# Patient Record
Sex: Female | Born: 1950
Health system: Southern US, Community
[De-identification: ages and names within clinical notes are randomized; demographics above are authoritative.]

## PROBLEM LIST (undated history)

## (undated) DIAGNOSIS — S83231A Complex tear of medial meniscus, current injury, right knee, initial encounter: Secondary | ICD-10-CM

## (undated) DIAGNOSIS — G8929 Other chronic pain: Secondary | ICD-10-CM

## (undated) DIAGNOSIS — E669 Obesity, unspecified: Secondary | ICD-10-CM

## (undated) DIAGNOSIS — R413 Other amnesia: Secondary | ICD-10-CM

## (undated) DIAGNOSIS — J45909 Unspecified asthma, uncomplicated: Secondary | ICD-10-CM

## (undated) DIAGNOSIS — R251 Tremor, unspecified: Secondary | ICD-10-CM

## (undated) DIAGNOSIS — M810 Age-related osteoporosis without current pathological fracture: Secondary | ICD-10-CM

## (undated) DIAGNOSIS — C801 Malignant (primary) neoplasm, unspecified: Secondary | ICD-10-CM

## (undated) DIAGNOSIS — M7542 Impingement syndrome of left shoulder: Secondary | ICD-10-CM

## (undated) DIAGNOSIS — E785 Hyperlipidemia, unspecified: Secondary | ICD-10-CM

## (undated) DIAGNOSIS — M48062 Spinal stenosis, lumbar region with neurogenic claudication: Secondary | ICD-10-CM

## (undated) DIAGNOSIS — I8393 Asymptomatic varicose veins of bilateral lower extremities: Secondary | ICD-10-CM

## (undated) DIAGNOSIS — Z860101 Personal history of adenomatous and serrated colon polyps: Secondary | ICD-10-CM

## (undated) DIAGNOSIS — M5030 Other cervical disc degeneration, unspecified cervical region: Secondary | ICD-10-CM

## (undated) DIAGNOSIS — E119 Type 2 diabetes mellitus without complications: Secondary | ICD-10-CM

## (undated) DIAGNOSIS — M549 Dorsalgia, unspecified: Secondary | ICD-10-CM

## (undated) DIAGNOSIS — M7052 Other bursitis of knee, left knee: Secondary | ICD-10-CM

## (undated) DIAGNOSIS — I10 Essential (primary) hypertension: Secondary | ICD-10-CM

## (undated) DIAGNOSIS — M1711 Unilateral primary osteoarthritis, right knee: Secondary | ICD-10-CM

## (undated) DIAGNOSIS — J309 Allergic rhinitis, unspecified: Secondary | ICD-10-CM

## (undated) HISTORY — PX: SPLENECTOMY: SUR1306

## (undated) HISTORY — DX: Type 2 diabetes mellitus without complications: E11.9

## (undated) HISTORY — DX: Age-related osteoporosis without current pathological fracture: M81.0

## (undated) HISTORY — DX: Hyperlipidemia, unspecified: E78.5

## (undated) HISTORY — PX: BREAST CYST ASPIRATION: SHX578

## (undated) HISTORY — PX: PARTIAL HYSTERECTOMY: SHX80

## (undated) HISTORY — PX: ABDOMINAL HYSTERECTOMY: SHX81

---

## 2005-04-06 ENCOUNTER — Ambulatory Visit: Payer: Self-pay | Admitting: Obstetrics and Gynecology

## 2005-04-07 ENCOUNTER — Ambulatory Visit: Payer: Self-pay | Admitting: Gastroenterology

## 2006-01-28 ENCOUNTER — Ambulatory Visit: Payer: Self-pay | Admitting: Family Medicine

## 2006-04-07 ENCOUNTER — Ambulatory Visit: Payer: Self-pay | Admitting: Obstetrics and Gynecology

## 2006-07-16 ENCOUNTER — Ambulatory Visit: Payer: Self-pay | Admitting: Family Medicine

## 2006-08-18 ENCOUNTER — Ambulatory Visit: Payer: Self-pay | Admitting: Family Medicine

## 2006-09-01 ENCOUNTER — Ambulatory Visit: Payer: Self-pay | Admitting: Family Medicine

## 2007-02-07 ENCOUNTER — Ambulatory Visit: Payer: Self-pay | Admitting: Family Medicine

## 2007-04-20 ENCOUNTER — Ambulatory Visit: Payer: Self-pay | Admitting: Obstetrics and Gynecology

## 2007-06-17 ENCOUNTER — Ambulatory Visit: Payer: Self-pay | Admitting: Family Medicine

## 2007-10-19 ENCOUNTER — Ambulatory Visit: Payer: Self-pay | Admitting: Family Medicine

## 2008-04-24 ENCOUNTER — Ambulatory Visit: Payer: Self-pay | Admitting: Obstetrics and Gynecology

## 2008-05-14 ENCOUNTER — Ambulatory Visit: Payer: Self-pay | Admitting: Unknown Physician Specialty

## 2009-03-11 ENCOUNTER — Emergency Department: Payer: Self-pay | Admitting: Emergency Medicine

## 2009-04-01 ENCOUNTER — Ambulatory Visit: Payer: Self-pay | Admitting: Family Medicine

## 2009-04-02 ENCOUNTER — Emergency Department: Payer: Self-pay | Admitting: Internal Medicine

## 2009-04-04 ENCOUNTER — Ambulatory Visit: Payer: Self-pay | Admitting: Family Medicine

## 2009-04-29 ENCOUNTER — Ambulatory Visit: Payer: Self-pay | Admitting: Obstetrics and Gynecology

## 2010-02-28 ENCOUNTER — Ambulatory Visit: Payer: Self-pay | Admitting: Family Medicine

## 2010-05-15 ENCOUNTER — Ambulatory Visit: Payer: Self-pay | Admitting: Obstetrics and Gynecology

## 2010-06-10 ENCOUNTER — Ambulatory Visit: Payer: Self-pay | Admitting: Family Medicine

## 2010-06-13 ENCOUNTER — Ambulatory Visit: Payer: Self-pay | Admitting: Family Medicine

## 2011-05-21 ENCOUNTER — Ambulatory Visit: Payer: Self-pay | Admitting: Obstetrics and Gynecology

## 2011-06-04 ENCOUNTER — Ambulatory Visit: Payer: Self-pay | Admitting: Obstetrics and Gynecology

## 2011-06-22 ENCOUNTER — Ambulatory Visit: Payer: Self-pay | Admitting: Surgery

## 2011-06-22 HISTORY — PX: BREAST BIOPSY: SHX20

## 2011-09-30 ENCOUNTER — Ambulatory Visit: Payer: Self-pay | Admitting: Family Medicine

## 2011-12-24 ENCOUNTER — Ambulatory Visit: Payer: Self-pay | Admitting: Surgery

## 2012-05-27 ENCOUNTER — Ambulatory Visit: Payer: Self-pay | Admitting: Obstetrics and Gynecology

## 2012-07-15 ENCOUNTER — Ambulatory Visit: Payer: Self-pay | Admitting: Family Medicine

## 2012-11-07 ENCOUNTER — Ambulatory Visit: Payer: Self-pay | Admitting: Family Medicine

## 2012-11-30 HISTORY — PX: COLONOSCOPY: SHX5424

## 2012-12-27 ENCOUNTER — Ambulatory Visit: Payer: Self-pay | Admitting: Family Medicine

## 2013-05-30 ENCOUNTER — Ambulatory Visit: Payer: Self-pay | Admitting: Obstetrics and Gynecology

## 2013-11-26 ENCOUNTER — Emergency Department: Payer: Self-pay | Admitting: Emergency Medicine

## 2013-11-26 LAB — CBC
HCT: 40.7 % (ref 35.0–47.0)
HGB: 13.1 g/dL (ref 12.0–16.0)
MCH: 25.6 pg — ABNORMAL LOW (ref 26.0–34.0)
MCV: 79 fL — ABNORMAL LOW (ref 80–100)
RBC: 5.13 10*6/uL (ref 3.80–5.20)
RDW: 13.3 % (ref 11.5–14.5)
WBC: 7.9 10*3/uL (ref 3.6–11.0)

## 2013-11-26 LAB — BASIC METABOLIC PANEL
Anion Gap: 6 — ABNORMAL LOW (ref 7–16)
Calcium, Total: 9.4 mg/dL (ref 8.5–10.1)
Chloride: 103 mmol/L (ref 98–107)
EGFR (Non-African Amer.): >60
Glucose: 130 mg/dL — ABNORMAL HIGH (ref 65–99)
Sodium: 137 mmol/L (ref 136–145)

## 2013-11-26 LAB — TROPONIN I: Troponin-I: <0.02 ng/mL

## 2014-04-12 DIAGNOSIS — M810 Age-related osteoporosis without current pathological fracture: Secondary | ICD-10-CM | POA: Insufficient documentation

## 2014-04-12 DIAGNOSIS — E119 Type 2 diabetes mellitus without complications: Secondary | ICD-10-CM | POA: Insufficient documentation

## 2014-04-12 DIAGNOSIS — E118 Type 2 diabetes mellitus with unspecified complications: Secondary | ICD-10-CM | POA: Insufficient documentation

## 2014-04-12 DIAGNOSIS — E7800 Pure hypercholesterolemia, unspecified: Secondary | ICD-10-CM | POA: Insufficient documentation

## 2014-05-31 ENCOUNTER — Ambulatory Visit: Payer: Self-pay | Admitting: Obstetrics and Gynecology

## 2015-05-11 ENCOUNTER — Other Ambulatory Visit: Payer: Self-pay | Admitting: Family Medicine

## 2015-05-11 DIAGNOSIS — E119 Type 2 diabetes mellitus without complications: Secondary | ICD-10-CM

## 2015-05-22 ENCOUNTER — Other Ambulatory Visit: Payer: Self-pay | Admitting: Obstetrics and Gynecology

## 2015-05-22 DIAGNOSIS — M549 Dorsalgia, unspecified: Secondary | ICD-10-CM | POA: Insufficient documentation

## 2015-05-22 DIAGNOSIS — E669 Obesity, unspecified: Secondary | ICD-10-CM | POA: Insufficient documentation

## 2015-05-22 DIAGNOSIS — Z1231 Encounter for screening mammogram for malignant neoplasm of breast: Secondary | ICD-10-CM

## 2015-06-04 ENCOUNTER — Ambulatory Visit
Admission: RE | Admit: 2015-06-04 | Discharge: 2015-06-04 | Disposition: A | Discharge status: Home / Self Care | Payer: Medicare Other | Source: Ambulatory Visit | Attending: Obstetrics and Gynecology | Admitting: Obstetrics and Gynecology

## 2015-06-04 DIAGNOSIS — Z1231 Encounter for screening mammogram for malignant neoplasm of breast: Secondary | ICD-10-CM

## 2015-06-04 HISTORY — DX: Malignant (primary) neoplasm, unspecified: C80.1

## 2015-06-29 ENCOUNTER — Other Ambulatory Visit: Payer: Self-pay | Admitting: Family Medicine

## 2015-06-29 DIAGNOSIS — M810 Age-related osteoporosis without current pathological fracture: Secondary | ICD-10-CM

## 2015-07-08 ENCOUNTER — Other Ambulatory Visit: Payer: Self-pay | Admitting: Family Medicine

## 2015-07-08 DIAGNOSIS — E119 Type 2 diabetes mellitus without complications: Secondary | ICD-10-CM

## 2015-07-08 DIAGNOSIS — E785 Hyperlipidemia, unspecified: Secondary | ICD-10-CM

## 2015-08-24 ENCOUNTER — Other Ambulatory Visit: Payer: Self-pay | Admitting: Family Medicine

## 2015-08-24 DIAGNOSIS — M810 Age-related osteoporosis without current pathological fracture: Secondary | ICD-10-CM

## 2015-08-26 ENCOUNTER — Other Ambulatory Visit: Payer: Self-pay | Admitting: Family Medicine

## 2015-08-26 DIAGNOSIS — E119 Type 2 diabetes mellitus without complications: Secondary | ICD-10-CM

## 2015-08-27 ENCOUNTER — Other Ambulatory Visit: Payer: Self-pay | Admitting: Family Medicine

## 2015-08-27 ENCOUNTER — Other Ambulatory Visit: Payer: Self-pay

## 2015-08-30 ENCOUNTER — Ambulatory Visit (INDEPENDENT_AMBULATORY_CARE_PROVIDER_SITE_OTHER): Payer: Medicare Other | Admitting: Family Medicine

## 2015-08-30 ENCOUNTER — Encounter: Payer: Self-pay | Admitting: Family Medicine

## 2015-08-30 VITALS — BP 138/80 | HR 80 | Ht 63.0 in | Wt 190.0 lb

## 2015-08-30 DIAGNOSIS — E119 Type 2 diabetes mellitus without complications: Secondary | ICD-10-CM

## 2015-08-30 DIAGNOSIS — E785 Hyperlipidemia, unspecified: Secondary | ICD-10-CM | POA: Diagnosis not present

## 2015-08-30 DIAGNOSIS — M810 Age-related osteoporosis without current pathological fracture: Secondary | ICD-10-CM | POA: Diagnosis not present

## 2015-08-30 MED ORDER — ALENDRONATE SODIUM 70 MG PO TABS: 70.0000 mg | ORAL_TABLET | ORAL | Status: DC

## 2015-08-30 MED ORDER — SIMVASTATIN 40 MG PO TABS: 40.0000 mg | ORAL_TABLET | Freq: Every day | ORAL | Status: DC

## 2015-08-30 MED ORDER — PIOGLITAZONE HCL 15 MG PO TABS: 15.0000 mg | ORAL_TABLET | Freq: Every day | ORAL | Status: DC

## 2015-08-30 MED ORDER — GLIPIZIDE ER 5 MG PO TB24: 5.0000 mg | ORAL_TABLET | Freq: Every day | ORAL | Status: DC

## 2015-08-30 MED ORDER — METFORMIN HCL 500 MG PO TABS: 500.0000 mg | ORAL_TABLET | Freq: Two times a day (BID) | ORAL | Status: DC

## 2015-08-30 NOTE — Patient Instructions (Signed)

## 2015-08-30 NOTE — Progress Notes (Signed)
Name: Lindsey George   MRN: [id number]    DOB: [birth year]   Date:08/30/2015       Progress Note  Subjective  Chief Complaint  Chief Complaint  Patient presents with  . Diabetes  . Hyperlipidemia  . Osteoporosis    HPI Comments: followup for osteoporosis/ on fosamax/  Allergy injections weekly  Diabetes She presents for her follow-up diabetic visit. She has type 2 diabetes mellitus. There are no hypoglycemic associated symptoms. Pertinent negatives for hypoglycemia include no dizziness, headaches or nervousness/anxiousness. Pertinent negatives for diabetes include no blurred vision, no chest pain, no fatigue, no foot paresthesias, no foot ulcerations, no polydipsia, no polyphagia, no polyuria, no visual change, no weakness and no weight loss. There are no hypoglycemic complications. Symptoms are stable. There are no diabetic complications. Risk factors for coronary artery disease include diabetes mellitus and obesity. Current diabetic treatment includes oral agent (triple therapy). She is compliant with treatment all of the time. Her weight is stable. She is following a generally healthy diet. When asked about meal planning, she reported none. There is no change in her home blood glucose trend. Her breakfast blood glucose range is generally 130-140 mg/dl. An ACE inhibitor/angiotensin II receptor blocker is not being taken. Eye exam is current.  Hyperlipidemia This is a chronic problem. The current episode started more than 1 year ago. Factors aggravating her hyperlipidemia include thiazides. Pertinent negatives include no chest pain, focal weakness, myalgias or shortness of breath. Current antihyperlipidemic treatment includes statins. The current treatment provides moderate improvement of lipids. There are no compliance problems.     No problem-specific assessment & plan notes found for this encounter.   Past Medical History  Diagnosis Date  . Cancer     skin ca  . Diabetes mellitus  without complication   . Hyperlipidemia   . Osteoporosis     Past Surgical History  Procedure Laterality Date  . Breast biopsy Right 06/22/11    bx/clip-neg  . Breast cyst aspiration Left     neg  . Partial hysterectomy    . Splenectomy    . Colonoscopy  2014    cleared for 5 yrs- [place] doc    Family History  Problem Relation Age of Onset  . Breast cancer Sister 100  . Cancer Sister   . Heart disease Sister   . Cancer Father   . Stroke Father     Social History   Social History  . Marital Status: Married    Spouse Name: N/A  . Number of Children: N/A  . Years of Education: N/A   Occupational History  . Not on file.   Social History Main Topics  . Smoking status: Never Smoker   . Smokeless tobacco: Not on file  . Alcohol Use: No  . Drug Use: No  . Sexual Activity: Yes   Other Topics Concern  . Not on file   Social History Narrative    Allergies  Allergen Reactions  . Codeine Other (See Comments)     Review of Systems  Constitutional: Negative for fever, chills, weight loss, malaise/fatigue and fatigue.  HENT: Negative for ear discharge, ear pain and sore throat.   Eyes: Negative for blurred vision.  Respiratory: Negative for cough, sputum production, shortness of breath and wheezing.   Cardiovascular: Negative for chest pain, palpitations and leg swelling.  Gastrointestinal: Negative for heartburn, nausea, abdominal pain, diarrhea, constipation, blood in stool and melena.  Genitourinary: Negative for dysuria, urgency, frequency and hematuria.  Musculoskeletal:  Negative for myalgias, back pain, joint pain and neck pain.  Skin: Negative for rash.  Neurological: Negative for dizziness, tingling, sensory change, focal weakness, weakness and headaches.  Endo/Heme/Allergies: Negative for environmental allergies, polydipsia and polyphagia. Does not bruise/bleed easily.  Psychiatric/Behavioral: Negative for depression and suicidal ideas. The patient is not  nervous/anxious and does not have insomnia.      Objective  Filed Vitals:   08/30/15 1007  BP: 138/80  Pulse: 80  Height: 5\' 3"  (1.6 m)  Weight: 190 lb (86.183 kg)    Physical Exam  Constitutional: She is well-developed, well-nourished, and in no distress. No distress.  HENT:  Head: Normocephalic and atraumatic.  Right Ear: External ear normal.  Left Ear: External ear normal.  Nose: Nose normal.  Mouth/Throat: Oropharynx is clear and moist.  Eyes: Conjunctivae and EOM are normal. Pupils are equal, round, and reactive to light. Right eye exhibits no discharge. Left eye exhibits no discharge.  Neck: Normal range of motion. Neck supple. No JVD present. No thyromegaly present.  Cardiovascular: Normal rate, regular rhythm, normal heart sounds and intact distal pulses.  Exam reveals no gallop and no friction rub.   No murmur heard. Pulmonary/Chest: Effort normal and breath sounds normal.  Abdominal: Soft. Bowel sounds are normal. She exhibits no mass. There is no tenderness. There is no guarding.  Musculoskeletal: Normal range of motion. She exhibits no edema.  Lymphadenopathy:    She has no cervical adenopathy.  Neurological: She is alert. She has normal reflexes.  Skin: Skin is warm and dry. She is not diaphoretic.  Psychiatric: Mood and affect normal.  Nursing note and vitals reviewed.     Assessment & Plan  Problem List Items Addressed This Visit    None    Visit Diagnoses    Type 2 diabetes mellitus without complication    -  Primary    Relevant Medications    aspirin 81 MG tablet    glipiZIDE (GLUCOTROL XL) 5 MG 24 hr tablet    metFORMIN (GLUCOPHAGE) 500 MG tablet    pioglitazone (ACTOS) 15 MG tablet    simvastatin (ZOCOR) 40 MG tablet    Other Relevant Orders    Renal Function Panel    HgB A1c    Hyperlipidemia        Relevant Medications    aspirin 81 MG tablet    simvastatin (ZOCOR) 40 MG tablet    Other Relevant Orders    Lipid Profile    Osteoporosis         Relevant Medications    calcium-vitamin D (OSCAL WITH D) 500-200 MG-UNIT tablet    alendronate (FOSAMAX) 70 MG tablet         Dr. Deanna Jones [facility]  08/30/2015

## 2015-08-31 LAB — LIPID PANEL
CHOL/HDL RATIO: 2.9 ratio (ref 0.0–4.4)
Cholesterol, Total: 142 mg/dL (ref 100–199)
HDL: 49 mg/dL (ref >39)
LDL CALC: 68 mg/dL (ref 0–99)
Triglycerides: 125 mg/dL (ref 0–149)
VLDL CHOLESTEROL CAL: 25 mg/dL (ref 5–40)

## 2015-08-31 LAB — RENAL FUNCTION PANEL
Albumin: 4.8 g/dL (ref 3.6–4.8)
BUN/Creatinine Ratio: 29 — ABNORMAL HIGH (ref 11–26)
BUN: 17 mg/dL (ref 8–27)
CO2: 26 mmol/L (ref 18–29)
Calcium: 10.2 mg/dL (ref 8.7–10.3)
Chloride: 98 mmol/L (ref 97–108)
Creatinine, Ser: 0.58 mg/dL (ref 0.57–1.00)
GFR calc Af Amer: 112 mL/min/{1.73_m2} (ref >59)
GFR, EST NON AFRICAN AMERICAN: 97 mL/min/{1.73_m2} (ref >59)
GLUCOSE: 118 mg/dL — AB (ref 65–99)
PHOSPHORUS: 3.8 mg/dL (ref 2.5–4.5)
POTASSIUM: 4.5 mmol/L (ref 3.5–5.2)
SODIUM: 138 mmol/L (ref 134–144)

## 2015-08-31 LAB — HEMOGLOBIN A1C
ESTIMATED AVERAGE GLUCOSE: 157 mg/dL
Hgb A1c MFr Bld: 7.1 % — ABNORMAL HIGH (ref 4.8–5.6)

## 2015-09-03 ENCOUNTER — Ambulatory Visit (INDEPENDENT_AMBULATORY_CARE_PROVIDER_SITE_OTHER): Payer: Medicare Other

## 2015-09-03 DIAGNOSIS — Z23 Encounter for immunization: Secondary | ICD-10-CM | POA: Diagnosis not present

## 2015-10-03 ENCOUNTER — Ambulatory Visit (INDEPENDENT_AMBULATORY_CARE_PROVIDER_SITE_OTHER): Payer: Medicare Other | Admitting: Family Medicine

## 2015-10-03 ENCOUNTER — Encounter: Payer: Self-pay | Admitting: Family Medicine

## 2015-10-03 VITALS — BP 120/60 | HR 84 | Ht 63.0 in | Wt 191.0 lb

## 2015-10-03 DIAGNOSIS — B070 Plantar wart: Secondary | ICD-10-CM | POA: Diagnosis not present

## 2015-10-03 NOTE — Patient Instructions (Signed)
Plantar Warts Warts are small growths on the skin. They can occur on various areas of the body. When they occur on the underside (sole) of the foot, they are called plantar warts. Plantar warts often occur in groups, with several small warts around a larger growth. They tend to develop over areas of pressure, such as the heel or the ball of the foot. Most warts are not painful, and they usually do not cause problems. However, plantar warts may cause pain when you walk because pressure is applied to them. Warts often go away on their own in time. Various treatments may be done if needed. Sometimes, warts go away and then they come back again. CAUSES Plantar warts are caused by a type of virus that is called human papillomavirus (HPV). HPV attacks a break in the skin of the foot. Walking barefoot can lead to exposure to the virus. These warts may spread to other areas of the sole. They spread to other areas of the body only through direct contact. RISK FACTORS Plantar warts are more likely to develop in:  People who are 10-20 years of age.  People who use public showers or locker rooms.  People who have a weakened body defense system (immune system). SYMPTOMS Plantar warts may be flat or slightly raised. They may grow into the deeper layers of skin or rise above the surface of the skin. Most plantar warts have a rough surface. They may cause pain when you use your foot to support your body weight. DIAGNOSIS A plantar wart can usually be diagnosed from its appearance. In some cases, a tissue sample may be removed (biopsy) to be looked at under a microscope. TREATMENT In many cases, warts do not need treatment. Without treatment, they often go away over a period of many months to a couple years. If treatment is needed, options may include:  Applying medicated solutions, creams, or patches to the wart. These may be over-the-counter or prescription medicines that make the skin soft so that layers will  gradually shed away. In many cases, the medicine is applied one or two times per day and covered with a bandage.  Putting duct tape over the top of the wart (occlusion). You will leave the tape in place for as long as told by your health care provider, then you will replace it with a new strip of tape. This is done until the wart goes away.  Freezing the wart with liquid nitrogen (cryotherapy).  Burning the wart with:  Laser treatment.  An electrified probe (electrocautery).  Injection of a medicine (Candida antigen) into the wart to help the body's immune system to fight off the wart.  Surgery to remove the wart. HOME CARE INSTRUCTIONS  Apply medicated creams or solutions only as told by your health care provider. This may involve:  Soaking the affected area in warm water.  Removing the top layer of softened skin before you apply the medicine. A pumice stone works well for removing the tissue.  Applying a bandage over the affected area after you apply the medicine.  Repeating the process daily or as told by your health care provider.  Do not scratch or pick at a wart.  Wash your hands after you touch a wart.  If a wart is painful, try applying a bandage with a hole in the middle over the wart. The helps to take pressure off the wart.  Keep all follow-up visits as told by your health care provider. This is important. PREVENTION   Take these actions to help prevent warts:  Wear shoes and socks. Change your socks daily.  Keep your feet clean and dry.  Check your feet regularly.  Avoid direct contact with warts on other people. SEEK MEDICAL CARE IF:  Your warts do not improve after treatment.  You have redness, swelling, or pain at the site of a wart.  You have bleeding from a wart that does not stop with light pressure.  You have diabetes and you develop a wart.   This information is not intended to replace advice given to you by your health care provider. Make sure  you discuss any questions you have with your health care provider.   Document Released: 02/06/2004 Document Revised: 08/07/2015 Document Reviewed: 02/11/2015 Elsevier Interactive Patient Education 2016 [employer]  

## 2015-10-03 NOTE — Progress Notes (Signed)
Name: Lindsey George   MRN: [id number]    DOB: 08/05/1951   Date:10/03/2015       Progress Note  Subjective  Chief Complaint  Chief Complaint  Patient presents with  . Foot Pain    walking causes a pain on the side of the R) foot that appears to have "something trying to come out of it"    Foot Pain This is a new problem. The current episode started 1 to 4 weeks ago. The problem occurs daily. The problem has been unchanged. Pertinent negatives include no abdominal pain, anorexia, chest pain, chills, coughing, fatigue, fever, headaches, myalgias, nausea, neck pain, rash or sore throat. Associated symptoms comments: Keratin buildup base of 5th toe. The symptoms are aggravated by walking. She has tried nothing for the symptoms. The treatment provided no relief.    No problem-specific assessment & plan notes found for this encounter.   Past Medical History  Diagnosis Date  . Cancer ([place])     skin ca  . Diabetes mellitus without complication ([place])   . Hyperlipidemia   . Osteoporosis     Past Surgical History  Procedure Laterality Date  . Breast biopsy Right 06/22/11    bx/clip-neg  . Breast cyst aspiration Left     neg  . Partial hysterectomy    . Splenectomy    . Colonoscopy  2014    cleared for 5 yrs- [place] doc    Family History  Problem Relation Age of Onset  . Breast cancer Sister 59  . Cancer Sister   . Heart disease Sister   . Cancer Father   . Stroke Father     Social History   Social History  . Marital Status: Married    Spouse Name: N/A  . Number of Children: N/A  . Years of Education: N/A   Occupational History  . Not on file.   Social History Main Topics  . Smoking status: Never Smoker   . Smokeless tobacco: Not on file  . Alcohol Use: No  . Drug Use: No  . Sexual Activity: Yes   Other Topics Concern  . Not on file   Social History Narrative    Allergies  Allergen Reactions  . Codeine Other (See Comments)     Review of Systems    Constitutional: Negative for fever, chills, weight loss, malaise/fatigue and fatigue.  HENT: Negative for ear discharge, ear pain and sore throat.   Eyes: Negative for blurred vision.  Respiratory: Negative for cough, sputum production, shortness of breath and wheezing.   Cardiovascular: Negative for chest pain, palpitations and leg swelling.  Gastrointestinal: Negative for heartburn, nausea, abdominal pain, diarrhea, constipation, blood in stool, melena and anorexia.  Genitourinary: Negative for dysuria, urgency, frequency and hematuria.  Musculoskeletal: Negative for myalgias, back pain, joint pain and neck pain.  Skin: Negative for rash.  Neurological: Negative for dizziness, tingling, sensory change, focal weakness and headaches.  Endo/Heme/Allergies: Negative for environmental allergies and polydipsia. Does not bruise/bleed easily.  Psychiatric/Behavioral: Negative for depression and suicidal ideas. The patient is not nervous/anxious and does not have insomnia.      Objective  Filed Vitals:   10/03/15 1442  BP: 120/60  Pulse: 84  Height: 5\' 3"  (1.6 m)  Weight: 191 lb (86.637 kg)    Physical Exam  Constitutional: She is well-developed, well-nourished, and in no distress. No distress.  HENT:  Head: Normocephalic and atraumatic.  Right Ear: External ear normal.  Left Ear: External ear normal.  Nose: Nose normal.  Mouth/Throat: Oropharynx is clear and moist.  Eyes: Conjunctivae and EOM are normal. Pupils are equal, round, and reactive to light. Right eye exhibits no discharge. Left eye exhibits no discharge.  Neck: Normal range of motion. Neck supple. No JVD present. No thyromegaly present.  Cardiovascular: Normal rate, regular rhythm, normal heart sounds and intact distal pulses.  Exam reveals no gallop and no friction rub.   No murmur heard. Pulmonary/Chest: Effort normal and breath sounds normal.  Abdominal: Soft. Bowel sounds are normal. She exhibits no mass. There is no  tenderness. There is no guarding.  Musculoskeletal: Normal range of motion. She exhibits no edema.  Lymphadenopathy:    She has no cervical adenopathy.  Neurological: She is alert. She has normal reflexes.  Skin: Skin is warm and dry. She is not diaphoretic.     Keratin dome with central depression  Psychiatric: Mood and affect normal.      Assessment & Plan  Problem List Items Addressed This Visit    None    Visit Diagnoses    Plantar wart of right foot    -  Primary    area cleaned debrided cryosurgery         Dr. [facility]  10/03/2015

## 2015-11-19 ENCOUNTER — Other Ambulatory Visit: Payer: Self-pay

## 2015-12-30 ENCOUNTER — Ambulatory Visit (INDEPENDENT_AMBULATORY_CARE_PROVIDER_SITE_OTHER): Payer: Medicare Other | Admitting: Family Medicine

## 2015-12-30 ENCOUNTER — Encounter: Payer: Self-pay | Admitting: Family Medicine

## 2015-12-30 VITALS — BP 120/62 | HR 80 | Temp 98.9°F | Ht 63.0 in | Wt 190.0 lb

## 2015-12-30 DIAGNOSIS — J4521 Mild intermittent asthma with (acute) exacerbation: Secondary | ICD-10-CM | POA: Diagnosis not present

## 2015-12-30 DIAGNOSIS — J40 Bronchitis, not specified as acute or chronic: Secondary | ICD-10-CM

## 2015-12-30 MED ORDER — ALBUTEROL SULFATE (2.5 MG/3ML) 0.083% IN NEBU: 2.5000 mg | INHALATION_SOLUTION | Freq: Four times a day (QID) | RESPIRATORY_TRACT | Status: DC | PRN

## 2015-12-30 MED ORDER — ALBUTEROL SULFATE (2.5 MG/3ML) 0.083% IN NEBU: 2.5000 mg | INHALATION_SOLUTION | Freq: Once | RESPIRATORY_TRACT | Status: DC

## 2015-12-30 MED ORDER — PREDNISONE 10 MG PO TABS: 10.0000 mg | ORAL_TABLET | Freq: Every day | ORAL | Status: DC

## 2015-12-30 MED ORDER — GUAIFENESIN-CODEINE 100-10 MG/5ML PO SYRP: 5.0000 mL | ORAL_SOLUTION | Freq: Three times a day (TID) | ORAL | Status: DC | PRN

## 2015-12-30 MED ORDER — LEVOFLOXACIN 500 MG PO TABS: 500.0000 mg | ORAL_TABLET | Freq: Every day | ORAL | Status: DC

## 2015-12-30 NOTE — Progress Notes (Signed)
Name: Lindsey George   MRN: [id number]    DOB: 1951/01/07   Date:12/30/2015       Progress Note  Subjective  Chief Complaint  Chief Complaint  Patient presents with  . Bronchitis    sick x 1 day- cong, SOB, cough- no production    Cough This is a new problem. The current episode started in the past 7 days. The problem has been gradually worsening. The problem occurs every few minutes. The cough is non-productive. Associated symptoms include a fever, headaches, nasal congestion, postnasal drip, rhinorrhea, shortness of breath and wheezing. Pertinent negatives include no chest pain, chills, ear congestion, ear pain, heartburn, hemoptysis, myalgias, rash, sore throat, sweats or weight loss. The symptoms are aggravated by cold air. She has tried a beta-agonist inhaler for the symptoms. Her past medical history is significant for asthma. There is no history of bronchiectasis, bronchitis, COPD, environmental allergies or pneumonia.    No problem-specific assessment & plan notes found for this encounter.   Past Medical History  Diagnosis Date  . Cancer ([place])     skin ca  . Diabetes mellitus without complication ([place])   . Hyperlipidemia   . Osteoporosis     Past Surgical History  Procedure Laterality Date  . Breast biopsy Right 06/22/11    bx/clip-neg  . Breast cyst aspiration Left     neg  . Partial hysterectomy    . Splenectomy    . Colonoscopy  2014    cleared for 5 yrs- [place] doc    Family History  Problem Relation Age of Onset  . Breast cancer Sister 59  . Cancer Sister   . Heart disease Sister   . Cancer Father   . Stroke Father     Social History   Social History  . Marital Status: Married    Spouse Name: N/A  . Number of Children: N/A  . Years of Education: N/A   Occupational History  . Not on file.   Social History Main Topics  . Smoking status: Never Smoker   . Smokeless tobacco: Not on file  . Alcohol Use: No  . Drug Use: No  . Sexual Activity: Yes    Other Topics Concern  . Not on file   Social History Narrative    Allergies  Allergen Reactions  . Codeine Other (See Comments)     Review of Systems  Constitutional: Positive for fever. Negative for chills, weight loss and malaise/fatigue.  HENT: Positive for postnasal drip and rhinorrhea. Negative for ear discharge, ear pain and sore throat.   Eyes: Negative for blurred vision.  Respiratory: Positive for cough, shortness of breath and wheezing. Negative for hemoptysis and sputum production.   Cardiovascular: Negative for chest pain, palpitations and leg swelling.  Gastrointestinal: Negative for heartburn, nausea, abdominal pain, diarrhea, constipation, blood in stool and melena.  Genitourinary: Negative for dysuria, urgency, frequency and hematuria.  Musculoskeletal: Negative for myalgias, back pain, joint pain and neck pain.  Skin: Negative for rash.  Neurological: Positive for headaches. Negative for dizziness, tingling, sensory change and focal weakness.  Endo/Heme/Allergies: Negative for environmental allergies and polydipsia. Does not bruise/bleed easily.  Psychiatric/Behavioral: Negative for depression and suicidal ideas. The patient is not nervous/anxious and does not have insomnia.      Objective  Filed Vitals:   12/30/15 1038  BP: 120/62  Pulse: 80  Temp: 98.9 F (37.2 C)  TempSrc: Oral  Height: 5\' 3"  (1.6 m)  Weight: 190 lb (86.183 kg)  SpO2: 94%    Physical Exam  Constitutional: She is well-developed, well-nourished, and in no distress. No distress.  HENT:  Head: Normocephalic and atraumatic.  Right Ear: Tympanic membrane, external ear and ear canal normal.  Left Ear: Tympanic membrane, external ear and ear canal normal.  Nose: Nose normal.  Mouth/Throat: Oropharynx is clear and moist.  Eyes: Conjunctivae and EOM are normal. Pupils are equal, round, and reactive to light. Right eye exhibits no discharge. Left eye exhibits no discharge.  Neck: Normal  range of motion. Neck supple. No JVD present. No thyromegaly present.  Cardiovascular: Normal rate, regular rhythm, normal heart sounds and intact distal pulses.  Exam reveals no gallop and no friction rub.   No murmur heard. Pulmonary/Chest: Effort normal. No respiratory distress. She has wheezes. She has no rales. She exhibits tenderness.  Abdominal: Soft. Bowel sounds are normal. She exhibits no mass. There is no tenderness. There is no guarding.  Musculoskeletal: Normal range of motion. She exhibits no edema.  Lymphadenopathy:    She has no cervical adenopathy.  Neurological: She is alert. She has normal reflexes.  Skin: Skin is warm and dry. She is not diaphoretic.  Psychiatric: Mood and affect normal.  Nursing note and vitals reviewed.     Assessment & Plan  Problem List Items Addressed This Visit    None    Visit Diagnoses    Bronchitis    -  Primary    Relevant Medications    albuterol (PROVENTIL) (2.5 MG/3ML) 0.083% nebulizer solution 2.5 mg (Start on 12/30/2015 11:30 AM)    Reactive airway disease, mild intermittent, with acute exacerbation        Relevant Medications    albuterol (PROVENTIL) (2.5 MG/3ML) 0.083% nebulizer solution 2.5 mg (Start on 12/30/2015 11:30 AM)         Dr. [facility]  12/30/2015

## 2015-12-30 NOTE — Addendum Note (Signed)
Addended by: Juline Patch on: 12/30/2015 11:32 AM   Modules accepted: Orders

## 2015-12-31 ENCOUNTER — Ambulatory Visit (INDEPENDENT_AMBULATORY_CARE_PROVIDER_SITE_OTHER): Payer: Medicare Other | Admitting: Family Medicine

## 2015-12-31 ENCOUNTER — Ambulatory Visit
Admission: EM | Admit: 2015-12-31 | Discharge: 2015-12-31 | Disposition: A | Discharge status: Home / Self Care | Payer: Medicare Other | Source: Ambulatory Visit | Attending: Family Medicine | Admitting: Family Medicine

## 2015-12-31 ENCOUNTER — Ambulatory Visit
Admission: RE | Admit: 2015-12-31 | Discharge: 2015-12-31 | Disposition: A | Discharge status: Home / Self Care | Payer: Medicare Other | Source: Ambulatory Visit | Attending: Family Medicine | Admitting: Family Medicine

## 2015-12-31 ENCOUNTER — Encounter: Payer: Self-pay | Admitting: Family Medicine

## 2015-12-31 VITALS — BP 120/80 | HR 72 | Temp 98.2°F | Ht 63.0 in | Wt 190.0 lb

## 2015-12-31 DIAGNOSIS — J40 Bronchitis, not specified as acute or chronic: Secondary | ICD-10-CM

## 2015-12-31 NOTE — Progress Notes (Signed)
Name: Lindsey George   MRN: [id number]    DOB: 1951-07-19   Date:12/31/2015       Progress Note  Subjective  Chief Complaint  Chief Complaint  Patient presents with  . Bronchitis    follow up from yesterday's visit    Cough This is a new problem. The current episode started in the past 7 days. The cough is non-productive. Pertinent negatives include no chest pain, chills, ear pain, fever, hemoptysis or nasal congestion. She has tried prescription cough suppressant and a beta-agonist inhaler (antibiotic) for the symptoms. The treatment provided mild relief. Her past medical history is significant for asthma and bronchitis.    No problem-specific assessment & plan notes found for this encounter.   Past Medical History  Diagnosis Date  . Cancer ([place])     skin ca  . Diabetes mellitus without complication ([place])   . Hyperlipidemia   . Osteoporosis     Past Surgical History  Procedure Laterality Date  . Breast biopsy Right 06/22/11    bx/clip-neg  . Breast cyst aspiration Left     neg  . Partial hysterectomy    . Splenectomy    . Colonoscopy  2014    cleared for 5 yrs- [place] doc    Family History  Problem Relation Age of Onset  . Breast cancer Sister 46  . Cancer Sister   . Heart disease Sister   . Cancer Father   . Stroke Father     Social History   Social History  . Marital Status: Married    Spouse Name: N/A  . Number of Children: N/A  . Years of Education: N/A   Occupational History  . Not on file.   Social History Main Topics  . Smoking status: Never Smoker   . Smokeless tobacco: Not on file  . Alcohol Use: No  . Drug Use: No  . Sexual Activity: Yes   Other Topics Concern  . Not on file   Social History Narrative    Allergies  Allergen Reactions  . Codeine Other (See Comments)     Review of Systems  Constitutional: Negative for fever and chills.  HENT: Negative for ear pain.   Respiratory: Positive for cough. Negative for hemoptysis.    Cardiovascular: Negative for chest pain.     Objective  Filed Vitals:   12/31/15 1053  BP: 120/80  Pulse: 72  Temp: 98.2 F (36.8 C)  TempSrc: Oral  Height: 5\' 3"  (1.6 m)  Weight: 190 lb (86.183 kg)  SpO2: 95%    Physical Exam  Constitutional: She is well-developed, well-nourished, and in no distress. No distress.  HENT:  Head: Normocephalic and atraumatic.  Right Ear: External ear normal.  Left Ear: External ear normal.  Nose: Nose normal.  Mouth/Throat: Oropharynx is clear and moist.  Eyes: Conjunctivae and EOM are normal. Pupils are equal, round, and reactive to light. Right eye exhibits no discharge. Left eye exhibits no discharge.  Neck: Normal range of motion. Neck supple. No JVD present. No thyromegaly present.  Cardiovascular: Normal rate, regular rhythm, normal heart sounds and intact distal pulses.  Exam reveals no gallop and no friction rub.   No murmur heard. Pulmonary/Chest: Effort normal. She has wheezes.  Abdominal: Soft. Bowel sounds are normal. She exhibits no mass. There is no tenderness. There is no guarding.  Musculoskeletal: Normal range of motion. She exhibits no edema.  Lymphadenopathy:    She has no cervical adenopathy.  Neurological: She is alert. She  has normal reflexes.  Skin: Skin is warm and dry. She is not diaphoretic.  Psychiatric: Mood and affect normal.      Assessment & Plan  Problem List Items Addressed This Visit    None    Visit Diagnoses    Bronchitis    -  Primary    Relevant Orders    DG Chest 2 View         Dr. Otilio Miu [facility]  12/31/2015

## 2016-01-03 ENCOUNTER — Ambulatory Visit (INDEPENDENT_AMBULATORY_CARE_PROVIDER_SITE_OTHER): Payer: Medicare Other | Admitting: Family Medicine

## 2016-01-03 ENCOUNTER — Encounter: Payer: Self-pay | Admitting: Family Medicine

## 2016-01-03 VITALS — BP 112/68 | HR 76 | Temp 98.1°F | Ht 63.0 in | Wt 190.0 lb

## 2016-01-03 DIAGNOSIS — J4520 Mild intermittent asthma, uncomplicated: Secondary | ICD-10-CM

## 2016-01-03 DIAGNOSIS — J40 Bronchitis, not specified as acute or chronic: Secondary | ICD-10-CM | POA: Diagnosis not present

## 2016-01-03 NOTE — Progress Notes (Signed)
Name: Lindsey George   MRN: [id number]    DOB: 01-20-1951   Date:01/03/2016       Progress Note  Subjective  Chief Complaint  Chief Complaint  Patient presents with  . Follow-up    bronchitis- feeling better    Cough This is a new problem. The current episode started in the past 7 days. The problem has been gradually improving. The cough is non-productive. Associated symptoms include shortness of breath. Pertinent negatives include no chest pain, chills, ear congestion, ear pain, fever, headaches, heartburn, hemoptysis, myalgias, nasal congestion, postnasal drip, rash, sore throat, weight loss or wheezing. The treatment provided moderate relief. There is no history of environmental allergies.    No problem-specific assessment & plan notes found for this encounter.   Past Medical History  Diagnosis Date  . Cancer ([place])     skin ca  . Diabetes mellitus without complication ([place])   . Hyperlipidemia   . Osteoporosis     Past Surgical History  Procedure Laterality Date  . Breast biopsy Right 06/22/11    bx/clip-neg  . Breast cyst aspiration Left     neg  . Partial hysterectomy    . Splenectomy    . Colonoscopy  2014    cleared for 5 yrs- [place] doc    Family History  Problem Relation Age of Onset  . Breast cancer Sister 94  . Cancer Sister   . Heart disease Sister   . Cancer Father   . Stroke Father     Social History   Social History  . Marital Status: Married    Spouse Name: N/A  . Number of Children: N/A  . Years of Education: N/A   Occupational History  . Not on file.   Social History Main Topics  . Smoking status: Never Smoker   . Smokeless tobacco: Not on file  . Alcohol Use: No  . Drug Use: No  . Sexual Activity: Yes   Other Topics Concern  . Not on file   Social History Narrative    Allergies  Allergen Reactions  . Codeine Other (See Comments)     Review of Systems  Constitutional: Negative for fever, chills, weight loss and  malaise/fatigue.  HENT: Negative for ear discharge, ear pain, postnasal drip and sore throat.   Eyes: Negative for blurred vision.  Respiratory: Positive for cough and shortness of breath. Negative for hemoptysis, sputum production and wheezing.   Cardiovascular: Negative for chest pain, palpitations and leg swelling.  Gastrointestinal: Negative for heartburn, nausea, abdominal pain, diarrhea, constipation, blood in stool and melena.  Genitourinary: Negative for dysuria, urgency, frequency and hematuria.  Musculoskeletal: Negative for myalgias, back pain, joint pain and neck pain.  Skin: Negative for rash.  Neurological: Negative for dizziness, tingling, sensory change, focal weakness and headaches.  Endo/Heme/Allergies: Negative for environmental allergies and polydipsia. Does not bruise/bleed easily.  Psychiatric/Behavioral: Negative for depression and suicidal ideas. The patient is not nervous/anxious and does not have insomnia.      Objective  Filed Vitals:   01/03/16 1033  BP: 112/68  Pulse: 76  Temp: 98.1 F (36.7 C)  TempSrc: Oral  Height: 5\' 3"  (1.6 m)  Weight: 190 lb (86.183 kg)    Physical Exam  Constitutional: She is well-developed, well-nourished, and in no distress. No distress.  HENT:  Head: Normocephalic and atraumatic.  Right Ear: External ear normal.  Left Ear: External ear normal.  Nose: Nose normal.  Mouth/Throat: Oropharynx is clear and moist.  Eyes:  Conjunctivae and EOM are normal. Pupils are equal, round, and reactive to light. Right eye exhibits no discharge. Left eye exhibits no discharge.  Neck: Normal range of motion. Neck supple. No JVD present. No thyromegaly present.  Cardiovascular: Normal rate, regular rhythm, normal heart sounds and intact distal pulses.  Exam reveals no gallop and no friction rub.   No murmur heard. Pulmonary/Chest: Effort normal and breath sounds normal.  Abdominal: Soft. Bowel sounds are normal. She exhibits no mass. There  is no tenderness. There is no guarding.  Musculoskeletal: Normal range of motion. She exhibits no edema.  Lymphadenopathy:    She has no cervical adenopathy.  Neurological: She is alert. She has normal reflexes.  Skin: Skin is warm and dry. She is not diaphoretic.  Psychiatric: Mood and affect normal.  Nursing note and vitals reviewed.     Assessment & Plan  Problem List Items Addressed This Visit    None    Visit Diagnoses    Bronchitis    -  Primary    Reactive airway disease, mild intermittent, uncomplicated             Dr. [facility]  01/03/2016

## 2016-01-14 ENCOUNTER — Encounter: Payer: Self-pay | Admitting: Family Medicine

## 2016-01-14 ENCOUNTER — Ambulatory Visit (INDEPENDENT_AMBULATORY_CARE_PROVIDER_SITE_OTHER): Payer: Medicare Other | Admitting: Family Medicine

## 2016-01-14 VITALS — BP 120/78 | HR 78 | Ht 63.0 in | Wt 191.0 lb

## 2016-01-14 DIAGNOSIS — E118 Type 2 diabetes mellitus with unspecified complications: Secondary | ICD-10-CM | POA: Diagnosis not present

## 2016-01-14 DIAGNOSIS — B029 Zoster without complications: Secondary | ICD-10-CM

## 2016-01-14 DIAGNOSIS — B019 Varicella without complication: Secondary | ICD-10-CM

## 2016-01-14 MED ORDER — TRAMADOL HCL 50 MG PO TABS: 50.0000 mg | ORAL_TABLET | Freq: Three times a day (TID) | ORAL | Status: DC | PRN

## 2016-01-14 MED ORDER — VALACYCLOVIR HCL 1 G PO TABS: 1000.0000 mg | ORAL_TABLET | Freq: Three times a day (TID) | ORAL | Status: DC

## 2016-01-14 MED ORDER — PREDNISONE 10 MG PO TABS: 10.0000 mg | ORAL_TABLET | Freq: Every day | ORAL | Status: DC

## 2016-01-14 NOTE — Patient Instructions (Signed)

## 2016-01-14 NOTE — Progress Notes (Signed)
Name: Lindsey George   MRN: [id number]    DOB: 1951/05/06   Date:01/14/2016       Progress Note  Subjective  Chief Complaint  Chief Complaint  Patient presents with  . Herpes Zoster    started with rash on Sunday- started hurting "just not feeling right on Friday"- rash going around the R) side from abdomen to back    Rash This is a new problem. The current episode started yesterday. The affected locations include the abdomen. The rash is characterized by blistering and redness (painful). She was exposed to nothing. Pertinent negatives include no congestion, cough, diarrhea, eye pain, facial edema, fatigue, fever, joint pain, nail changes, rhinorrhea, shortness of breath, sore throat or vomiting. Past treatments include nothing. The treatment provided mild relief.    No problem-specific assessment & plan notes found for this encounter.   Past Medical History  Diagnosis Date  . Cancer ([place])     skin ca  . Diabetes mellitus without complication ([place])   . Hyperlipidemia   . Osteoporosis     Past Surgical History  Procedure Laterality Date  . Breast biopsy Right 06/22/11    bx/clip-neg  . Breast cyst aspiration Left     neg  . Partial hysterectomy    . Splenectomy    . Colonoscopy  2014    cleared for 5 yrs- [facility] doc    Family History  Problem Relation Age of Onset  . Breast cancer Sister 22  . Cancer Sister   . Heart disease Sister   . Cancer Father   . Stroke Father     Social History   Social History  . Marital Status: Married    Spouse Name: N/A  . Number of Children: N/A  . Years of Education: N/A   Occupational History  . Not on file.   Social History Main Topics  . Smoking status: Never Smoker   . Smokeless tobacco: Not on file  . Alcohol Use: No  . Drug Use: No  . Sexual Activity: Yes   Other Topics Concern  . Not on file   Social History Narrative    Allergies  Allergen Reactions  . Codeine Other (See Comments)     Review of Systems   Constitutional: Negative for fever, chills, weight loss, malaise/fatigue and fatigue.  HENT: Negative for congestion, ear discharge, ear pain, rhinorrhea and sore throat.   Eyes: Negative for blurred vision and pain.  Respiratory: Negative for cough, sputum production, shortness of breath and wheezing.   Cardiovascular: Negative for chest pain, palpitations and leg swelling.  Gastrointestinal: Negative for heartburn, nausea, vomiting, abdominal pain, diarrhea, constipation, blood in stool and melena.  Genitourinary: Negative for dysuria, urgency, frequency and hematuria.  Musculoskeletal: Negative for myalgias, back pain, joint pain and neck pain.  Skin: Positive for rash. Negative for nail changes.  Neurological: Negative for dizziness, tingling, sensory change, focal weakness and headaches.  Endo/Heme/Allergies: Negative for environmental allergies and polydipsia. Does not bruise/bleed easily.  Psychiatric/Behavioral: Negative for depression and suicidal ideas. The patient is not nervous/anxious and does not have insomnia.      Objective  Filed Vitals:   01/14/16 0954  BP: 120/78  Pulse: 78  Height: 5\' 3"  (1.6 m)  Weight: 191 lb (86.637 kg)    Physical Exam  Constitutional: She is well-developed, well-nourished, and in no distress. No distress.  HENT:  Head: Normocephalic and atraumatic.  Right Ear: External ear normal.  Left Ear: External ear normal.  Nose:  Nose normal.  Mouth/Throat: Oropharynx is clear and moist.  Eyes: Conjunctivae and EOM are normal. Pupils are equal, round, and reactive to light. Right eye exhibits no discharge. Left eye exhibits no discharge.  Neck: Normal range of motion. Neck supple. No JVD present. No thyromegaly present.  Cardiovascular: Normal rate, regular rhythm, normal heart sounds and intact distal pulses.  Exam reveals no gallop and no friction rub.   No murmur heard. Pulmonary/Chest: Effort normal and breath sounds normal.  Abdominal: Soft.  Bowel sounds are normal. She exhibits no mass. There is no tenderness. There is no guarding.  Musculoskeletal: Normal range of motion. She exhibits no edema.  Lymphadenopathy:    She has no cervical adenopathy.  Neurological: She is alert. She has normal reflexes.  Skin: Skin is warm and dry. Rash noted. Rash is macular and vesicular. She is not diaphoretic.     Psychiatric: Mood and affect normal.      Assessment & Plan  Problem List Items Addressed This Visit    None    Visit Diagnoses    Varicella-zoster    -  Primary    Relevant Medications    valACYclovir (VALTREX) 1000 MG tablet    predniSONE (DELTASONE) 10 MG tablet    traMADol (ULTRAM) 50 MG tablet    Type 2 diabetes mellitus with complication, without long-term current use of insulin (HCC)        Relevant Orders    Hemoglobin A1c         Dr. Shreeya [facility]end[facility]  01/14/2016

## 2016-01-15 LAB — HEMOGLOBIN A1C
Est. average glucose Bld gHb Est-mCnc: 160 mg/dL
HEMOGLOBIN A1C: 7.2 % — AB (ref 4.8–5.6)

## 2016-01-20 ENCOUNTER — Other Ambulatory Visit: Payer: Self-pay | Admitting: Family Medicine

## 2016-01-23 ENCOUNTER — Ambulatory Visit (INDEPENDENT_AMBULATORY_CARE_PROVIDER_SITE_OTHER): Payer: Medicare Other | Admitting: Family Medicine

## 2016-01-23 ENCOUNTER — Encounter: Payer: Self-pay | Admitting: Family Medicine

## 2016-01-23 VITALS — BP 120/78 | HR 72 | Ht 63.0 in | Wt 187.0 lb

## 2016-01-23 DIAGNOSIS — B0229 Other postherpetic nervous system involvement: Secondary | ICD-10-CM

## 2016-01-23 MED ORDER — PREGABALIN 100 MG PO CAPS: 100.0000 mg | ORAL_CAPSULE | Freq: Two times a day (BID) | ORAL | Status: DC

## 2016-01-23 NOTE — Patient Instructions (Signed)
Shingles Shingles, which is also known as herpes zoster, is an infection that causes a painful skin rash and fluid-filled blisters. Shingles is not related to genital herpes, which is a sexually transmitted infection.   Shingles only develops in people who:  Have had chickenpox.  Have received the chickenpox vaccine. (This is rare.) CAUSES Shingles is caused by varicella-zoster virus (VZV). This is the same virus that causes chickenpox. After exposure to VZV, the virus stays in the body in an inactive (dormant) state. Shingles develops if the virus reactivates. This can happen many years after the initial exposure to VZV. It is not known what causes this virus to reactivate. RISK FACTORS People who have had chickenpox or received the chickenpox vaccine are at risk for shingles. Infection is more common in people who:  Are older than age 50.  Have a weakened defense (immune) system, such as those with HIV, AIDS, or cancer.  Are taking medicines that weaken the immune system, such as transplant medicines.  Are under great stress. SYMPTOMS Early symptoms of this condition include itching, tingling, and pain in an area on your skin. Pain may be described as burning, stabbing, or throbbing. A few days or weeks after symptoms start, a painful red rash appears, usually on one side of the body in a bandlike or beltlike pattern. The rash eventually turns into fluid-filled blisters that break open, scab over, and dry up in about 2-3 weeks. At any time during the infection, you may also develop:  A fever.  Chills.  A headache.  An upset stomach. DIAGNOSIS This condition is diagnosed with a skin exam. Sometimes, skin or fluid samples are taken from the blisters before a diagnosis is made. These samples are examined under a microscope or sent to a lab for testing. TREATMENT There is no specific cure for this condition. Your health care provider will probably prescribe medicines to help you  manage pain, recover more quickly, and avoid long-term problems. Medicines may include:  Antiviral drugs.  Anti-inflammatory drugs.  Pain medicines. If the area involved is on your face, you may be referred to a specialist, such as an eye doctor (ophthalmologist) or an ear, nose, and throat (ENT) doctor to help you avoid eye problems, chronic pain, or disability. HOME CARE INSTRUCTIONS Medicines  Take medicines only as directed by your health care provider.  Apply an anti-itch or numbing cream to the affected area as directed by your health care provider. Blister and Rash Care  Take a cool bath or apply cool compresses to the area of the rash or blisters as directed by your health care provider. This may help with pain and itching.  Keep your rash covered with a loose bandage (dressing). Wear loose-fitting clothing to help ease the pain of material rubbing against the rash.  Keep your rash and blisters clean with mild soap and cool water or as directed by your health care provider.  Check your rash every day for signs of infection. These include redness, swelling, and pain that lasts or increases.  Do not pick your blisters.  Do not scratch your rash. General Instructions  Rest as directed by your health care provider.  Keep all follow-up visits as directed by your health care provider. This is important.  Until your blisters scab over, your infection can cause chickenpox in people who have never had it or been vaccinated against it. To prevent this from happening, avoid contact with other people, especially:  Babies.  Pregnant women.  Children   is important.  · Until your blisters scab over, your infection can cause chickenpox in people who have never had it or been vaccinated against it. To prevent this from happening, avoid contact with other people, especially:    Babies.    Pregnant women.    Children who have eczema.    Elderly people who have transplants.    People who have chronic illnesses, such as leukemia or AIDS.  SEEK MEDICAL CARE IF:  · Your pain is not relieved with prescribed medicines.  · Your pain does not get better after the rash heals.  · Your rash looks infected. Signs of infection include redness, swelling, and pain  that lasts or increases.  SEEK IMMEDIATE MEDICAL CARE IF:  · The rash is on your face or nose.  · You have facial pain, pain around your eye area, or loss of feeling on one side of your face.  · You have ear pain or you have ringing in your ear.  · You have loss of taste.  · Your condition gets worse.     This information is not intended to replace advice given to you by your health care provider. Make sure you discuss any questions you have with your health care provider.     Document Released: 11/16/2005 Document Revised: 12/07/2014 Document Reviewed: 09/27/2014  Elsevier Interactive Patient Education ©2016 Else[employer] Inc[employer]  Postherpetic Neuralgia  Postherpetic neuralgia (PHN) is nerve pain that occurs after a shingles infection. Shingles is a painful rash that appears on one side of the body, usually on your trunk or face. Shingles is caused by the varicella-zoster virus. This is the same virus that causes chickenpox. In people who have had chickenpox, the virus can resurface years later and cause shingles.  You may have PHN if you continue to have pain for 3 months after your shingles rash has gone away. PHN appears in the same area where you had the shingles rash. For most people, PHN goes away within 1 year.   Getting a vaccination for shingles can prevent PHN. This vaccine is recommended for people older than 50. It may prevent shingles and may also lower your risk of PHN if you do get shingles.  CAUSES  PHN is caused by damage to your nerves from the varicella-zoster virus. This damage makes your nerves overly sensitive.   RISK FACTORS  Aging is the biggest risk factor for developing PHN. Most people who get PHN are older than 60. Other risk factors include:  · Having very bad pain before your shingles rash starts.  · Having a very bad rash.  · Having shingles in the nerve that supplies your face and eye (trigeminal nerve).  SIGNS AND SYMPTOMS  Pain is the main symptom of PHN. The pain is often very bad and may  be described as stabbing, burning, or feeling like an electric shock. The pain may come and go or may be there all the time. Pain may be triggered by light touches on the skin or changes in temperature. You may have itching along with the pain.  DIAGNOSIS   Your health care provider may diagnose PHN based on your symptoms and your history of shingles. Lab studies and other diagnostic tests are usually not needed.  TREATMENT   There is no cure for PHN. Treatment for PHN will focus on pain relief. Over-the-counter pain relievers do not usually relieve PHN pain. You may need to work with a pain specialist. Treatment may include:  ·   Antidepressant medicines to help with pain and improve sleep.  · Antiseizure medicines to relieve nerve pain.  · Strong pain relievers (opioids).  · A numbing patch worn on the skin (lidocaine patch).  HOME CARE INSTRUCTIONS  It may take a long time to recover from PHN. Work closely with your health care provider, and have a good support system at home.   · Take all medicines as directed by your health care provider.  · Wear loose, comfortable clothing.  · Cover sensitive areas with a dressing to reduce friction from clothing rubbing on the area.  · If cold does not make your pain worse, try applying a cool compress or cooling gel pack to the area.  · Talk to your health care provider if you feel depressed or desperate. Living with long-term pain can be depressing.  SEEK MEDICAL CARE IF:  · Your medicine is not helping.  · You are struggling to manage your pain at home.     This information is not intended to replace advice given to you by your health care provider. Make sure you discuss any questions you have with your health care provider.     Document Released: 02/06/2003 Document Revised: 12/07/2014 Document Reviewed: 11/07/2013  Elsevier Interactive Patient Education ©2016 Elsevier Inc.

## 2016-01-23 NOTE — Progress Notes (Signed)
Name: Lindsey George   MRN: [id number]    DOB: June 04, 1951   Date:01/23/2016       Progress Note  Subjective  Chief Complaint  Chief Complaint  Patient presents with  . Herpes Zoster    follow up shingles- still in a lot of pain    Neurologic Problem The patient's pertinent negatives include no focal weakness. Primary symptoms comment: focal nerve pain. This is a new problem. The current episode started in the past 7 days. The neurological problem developed suddenly. The problem is unchanged. There was right-sided focality noted. Associated symptoms include back pain. Pertinent negatives include no abdominal pain, bladder incontinence, bowel incontinence, chest pain, dizziness, fever, headaches, nausea, neck pain, palpitations or shortness of breath. The treatment provided mild relief.    No problem-specific assessment & plan notes found for this encounter.   Past Medical History  Diagnosis Date  . Cancer ([place])     skin ca  . Diabetes mellitus without complication ([place])   . Hyperlipidemia   . Osteoporosis     Past Surgical History  Procedure Laterality Date  . Breast biopsy Right 06/22/11    bx/clip-neg  . Breast cyst aspiration Left     neg  . Partial hysterectomy    . Splenectomy    . Colonoscopy  2014    cleared for 5 yrs- [facility] doc    Family History  Problem Relation Age of Onset  . Breast cancer Sister 22  . Cancer Sister   . Heart disease Sister   . Cancer Father   . Stroke Father     Social History   Social History  . Marital Status: Married    Spouse Name: N/A  . Number of Children: N/A  . Years of Education: N/A   Occupational History  . Not on file.   Social History Main Topics  . Smoking status: Never Smoker   . Smokeless tobacco: Not on file  . Alcohol Use: No  . Drug Use: No  . Sexual Activity: Yes   Other Topics Concern  . Not on file   Social History Narrative    Allergies  Allergen Reactions  . Codeine Other (See Comments)      Review of Systems  Constitutional: Negative for fever, chills, weight loss and malaise/fatigue.  HENT: Negative for ear discharge, ear pain and sore throat.   Eyes: Negative for blurred vision.  Respiratory: Negative for cough, sputum production, shortness of breath and wheezing.   Cardiovascular: Negative for chest pain, palpitations and leg swelling.  Gastrointestinal: Negative for heartburn, nausea, abdominal pain, diarrhea, constipation, blood in stool, melena and bowel incontinence.  Genitourinary: Negative for bladder incontinence, dysuria, urgency, frequency and hematuria.  Musculoskeletal: Positive for back pain. Negative for myalgias, joint pain and neck pain.  Skin: Negative for rash.  Neurological: Negative for dizziness, tingling, sensory change, focal weakness and headaches.  Endo/Heme/Allergies: Negative for environmental allergies and polydipsia. Does not bruise/bleed easily.  Psychiatric/Behavioral: Negative for depression and suicidal ideas. The patient is not nervous/anxious and does not have insomnia.      Objective  Filed Vitals:   01/23/16 0919  BP: 120/78  Pulse: 72  Height: 5\' 3"  (1.6 m)  Weight: 187 lb (84.823 kg)    Physical Exam  Constitutional: She is well-developed, well-nourished, and in no distress. No distress.  HENT:  Head: Normocephalic and atraumatic.  Right Ear: External ear normal.  Left Ear: External ear normal.  Nose: Nose normal.  Mouth/Throat: Oropharynx is  clear and moist.  Eyes: Conjunctivae and EOM are normal. Pupils are equal, round, and reactive to light. Right eye exhibits no discharge. Left eye exhibits no discharge.  Neck: Normal range of motion. Neck supple. No JVD present. No thyromegaly present.  Cardiovascular: Normal rate, regular rhythm, normal heart sounds and intact distal pulses.  Exam reveals no gallop and no friction rub.   No murmur heard. Pulmonary/Chest: Effort normal and breath sounds normal.  Abdominal:  Soft. Bowel sounds are normal. She exhibits no mass. There is no tenderness. There is no guarding.  Musculoskeletal: Normal range of motion. She exhibits no edema.  Lymphadenopathy:    She has no cervical adenopathy.  Neurological: She is alert. She has normal reflexes.  Skin: Skin is warm and dry. Rash noted. Rash is maculopapular. She is not diaphoretic.  Psychiatric: Mood and affect normal.  Nursing note and vitals reviewed.     Assessment & Plan  Problem List Items Addressed This Visit    None    Visit Diagnoses    Postherpetic neuralgia    -  Primary    Relevant Medications    pregabalin (LYRICA) 100 MG capsule         Dr. [facility]  01/23/2016

## 2016-03-04 ENCOUNTER — Ambulatory Visit (INDEPENDENT_AMBULATORY_CARE_PROVIDER_SITE_OTHER): Payer: Medicare Other | Admitting: Family Medicine

## 2016-03-04 ENCOUNTER — Encounter: Payer: Self-pay | Admitting: Family Medicine

## 2016-03-04 VITALS — BP 120/70 | HR 80 | Ht 63.0 in | Wt 187.0 lb

## 2016-03-04 DIAGNOSIS — B0229 Other postherpetic nervous system involvement: Secondary | ICD-10-CM

## 2016-03-04 MED ORDER — AMITRIPTYLINE HCL 25 MG PO TABS: 25.0000 mg | ORAL_TABLET | Freq: Every day | ORAL | Status: DC

## 2016-03-04 NOTE — Patient Instructions (Signed)
Postherpetic Neuralgia   Postherpetic neuralgia (PHN) is nerve pain that occurs after a shingles infection. Shingles is a painful rash that appears on one side of the body, usually on your trunk or face. Shingles is caused by the varicella-zoster virus. This is the same virus that causes chickenpox. In people who have had chickenpox, the virus can resurface years later and cause shingles.   You may have PHN if you continue to have pain for 3 months after your shingles rash has gone away. PHN appears in the same area where you had the shingles rash. For most people, PHN goes away within 1 year.   Getting a vaccination for shingles can prevent PHN. This vaccine is recommended for people older than 50. It may prevent shingles and may also lower your risk of PHN if you do get shingles.   CAUSES   PHN is caused by damage to your nerves from the varicella-zoster virus. This damage makes your nerves overly sensitive.   RISK FACTORS   Aging is the biggest risk factor for developing PHN. Most people who get PHN are older than 60. Other risk factors include:   Having very bad pain before your shingles rash starts.   Having a very bad rash.   Having shingles in the nerve that supplies your face and eye (trigeminal nerve).  SIGNS AND SYMPTOMS   Pain is the main symptom of PHN. The pain is often very bad and may be described as stabbing, burning, or feeling like an electric shock. The pain may come and go or may be there all the time. Pain may be triggered by light touches on the skin or changes in temperature. You may have itching along with the pain.   DIAGNOSIS   Your health care provider may diagnose PHN based on your symptoms and your history of shingles. Lab studies and other diagnostic tests are usually not needed.   TREATMENT   There is no cure for PHN. Treatment for PHN will focus on pain relief. Over-the-counter pain relievers do not usually relieve PHN pain. You may need to work with a pain specialist. Treatment may  include:   Antidepressant medicines to help with pain and improve sleep.   Antiseizure medicines to relieve nerve pain.   Strong pain relievers (opioids).   A numbing patch worn on the skin (lidocaine patch).  HOME CARE INSTRUCTIONS   It may take a long time to recover from PHN. Work closely with your health care provider, and have a good support system at home.   Take all medicines as directed by your health care provider.   Wear loose, comfortable clothing.   Cover sensitive areas with a dressing to reduce friction from clothing rubbing on the area.   If cold does not make your pain worse, try applying a cool compress or cooling gel pack to the area.   Talk to your health care provider if you feel depressed or desperate. Living with long-term pain can be depressing.  SEEK MEDICAL CARE IF:   Your medicine is not helping.   You are struggling to manage your pain at home.  This information is not intended to replace advice given to you by your health care provider. Make sure you discuss any questions you have with your health care provider.   Document Released: 02/06/2003 Document Revised: 12/07/2014 Document Reviewed: 11/07/2013   Elsevier Interactive Patient Education 2016 [employer]

## 2016-03-04 NOTE — Progress Notes (Signed)
Name: Lindsey George   MRN: [id number]    DOB: Oct 30, 1951   Date:03/04/2016       Progress Note  Subjective  Chief Complaint  Chief Complaint  Patient presents with  . Herpes Zoster    follow up on shingles- pt did not finish Valtrex and quit taking Lyrica and Tramadol for pain- will start back taking    Other This is a recurrent problem. The current episode started 1 to 4 weeks ago. The problem occurs constantly. The problem has been waxing and waning. Pertinent negatives include no abdominal pain, chest pain, chills, coughing, fever, headaches, myalgias, nausea, neck pain, numbness, rash, sore throat, vertigo or weakness. Associated symptoms comments: Dermatome area of pain/ left pelvic. The symptoms are aggravated by bending, twisting and standing. She has tried nothing for the symptoms. The treatment provided no relief.    No problem-specific assessment & plan notes found for this encounter.   Past Medical History  Diagnosis Date  . Cancer ([place])     skin ca  . Diabetes mellitus without complication ([place])   . Hyperlipidemia   . Osteoporosis     Past Surgical History  Procedure Laterality Date  . Breast biopsy Right 06/22/11    bx/clip-neg  . Breast cyst aspiration Left     neg  . Partial hysterectomy    . Splenectomy    . Colonoscopy  2014    cleared for 5 yrs- [place] doc    Family History  Problem Relation Age of Onset  . Breast cancer Sister 23  . Cancer Sister   . Heart disease Sister   . Cancer Father   . Stroke Father     Social History   Social History  . Marital Status: Married    Spouse Name: N/A  . Number of Children: N/A  . Years of Education: N/A   Occupational History  . Not on file.   Social History Main Topics  . Smoking status: Never Smoker   . Smokeless tobacco: Not on file  . Alcohol Use: No  . Drug Use: No  . Sexual Activity: Yes   Other Topics Concern  . Not on file   Social History Narrative    Allergies  Allergen  Reactions  . Codeine Other (See Comments)     Review of Systems  Constitutional: Negative for fever, chills, weight loss and malaise/fatigue.  HENT: Negative for ear discharge, ear pain and sore throat.   Eyes: Negative for blurred vision.  Respiratory: Negative for cough, sputum production, shortness of breath and wheezing.   Cardiovascular: Negative for chest pain, palpitations and leg swelling.  Gastrointestinal: Negative for heartburn, nausea, abdominal pain, diarrhea, constipation, blood in stool and melena.  Genitourinary: Negative for dysuria, urgency, frequency and hematuria.  Musculoskeletal: Negative for myalgias, back pain, joint pain and neck pain.  Skin: Negative for rash.  Neurological: Negative for dizziness, vertigo, tingling, sensory change, focal weakness, weakness, numbness and headaches.  Endo/Heme/Allergies: Negative for environmental allergies and polydipsia. Does not bruise/bleed easily.  Psychiatric/Behavioral: Negative for depression and suicidal ideas. The patient is not nervous/anxious and does not have insomnia.      Objective  Filed Vitals:   03/04/16 0819  BP: 120/70  Pulse: 80  Height: 5\' 3"  (1.6 m)  Weight: 187 lb (84.823 kg)    Physical Exam  Constitutional: She is well-developed, well-nourished, and in no distress. No distress.  HENT:  Head: Normocephalic and atraumatic.  Right Ear: External ear normal.  Left Ear:  External ear normal.  Nose: Nose normal.  Mouth/Throat: Oropharynx is clear and moist.  Eyes: Conjunctivae and EOM are normal. Pupils are equal, round, and reactive to light. Right eye exhibits no discharge. Left eye exhibits no discharge.  Neck: Normal range of motion. Neck supple. No JVD present. No thyromegaly present.  Cardiovascular: Normal rate, regular rhythm, normal heart sounds and intact distal pulses.  Exam reveals no gallop and no friction rub.   No murmur heard. Pulmonary/Chest: Effort normal and breath sounds normal.   Abdominal: Soft. Bowel sounds are normal. She exhibits no mass. There is no tenderness. There is no guarding.  Musculoskeletal: Normal range of motion. She exhibits no edema.  Lymphadenopathy:    She has no cervical adenopathy.  Neurological: She is alert. She has normal reflexes.  Skin: Skin is warm and dry. She is not diaphoretic.  Psychiatric: Mood and affect normal.  Nursing note and vitals reviewed.     Assessment & Plan  Problem List Items Addressed This Visit    None    Visit Diagnoses    Neuralgia, postherpetic    -  Primary    resume tramadfol adnd lyrica as prescribed    Relevant Medications    amitriptyline (ELAVIL) 25 MG tablet         Dr. Othell Diluzio [facility]  03/04/2016

## 2016-03-06 ENCOUNTER — Other Ambulatory Visit: Payer: Self-pay | Admitting: Family Medicine

## 2016-03-20 ENCOUNTER — Encounter: Payer: Self-pay | Admitting: Family Medicine

## 2016-03-20 ENCOUNTER — Ambulatory Visit (INDEPENDENT_AMBULATORY_CARE_PROVIDER_SITE_OTHER): Payer: Medicare Other | Admitting: Family Medicine

## 2016-03-20 VITALS — BP 122/76 | HR 83 | Temp 97.1°F | Resp 16 | Ht 63.0 in | Wt 189.8 lb

## 2016-03-20 DIAGNOSIS — J40 Bronchitis, not specified as acute or chronic: Secondary | ICD-10-CM

## 2016-03-20 DIAGNOSIS — R059 Cough, unspecified: Secondary | ICD-10-CM

## 2016-03-20 DIAGNOSIS — J069 Acute upper respiratory infection, unspecified: Secondary | ICD-10-CM

## 2016-03-20 DIAGNOSIS — J4520 Mild intermittent asthma, uncomplicated: Secondary | ICD-10-CM

## 2016-03-20 DIAGNOSIS — R05 Cough: Secondary | ICD-10-CM

## 2016-03-20 MED ORDER — ALBUTEROL SULFATE HFA 108 (90 BASE) MCG/ACT IN AERS: 2.0000 | INHALATION_SPRAY | Freq: Four times a day (QID) | RESPIRATORY_TRACT | Status: DC | PRN

## 2016-03-20 MED ORDER — ALBUTEROL SULFATE (2.5 MG/3ML) 0.083% IN NEBU
2.5000 mg | INHALATION_SOLUTION | Freq: Once | RESPIRATORY_TRACT | Status: AC
  Administered 2016-03-20: 2.5 mg via RESPIRATORY_TRACT

## 2016-03-20 MED ORDER — PREDNISONE 10 MG PO TABS: 10.0000 mg | ORAL_TABLET | Freq: Every day | ORAL | Status: DC

## 2016-03-20 MED ORDER — AMOXICILLIN-POT CLAVULANATE 875-125 MG PO TABS: 1.0000 | ORAL_TABLET | Freq: Two times a day (BID) | ORAL | Status: DC

## 2016-03-20 MED ORDER — MONTELUKAST SODIUM 10 MG PO TABS: 10.0000 mg | ORAL_TABLET | Freq: Every day | ORAL | Status: DC

## 2016-03-20 NOTE — Progress Notes (Signed)
Name: Lindsey George   MRN: [id number]    DOB: November 01, 1951   Date:03/20/2016       Progress Note  Subjective  Chief Complaint  Chief Complaint  Patient presents with  . Cough    keeping her up at night-no wheezing vut feels tight. Brought meds she took in January to see if they will help.     Cough This is a new problem. The current episode started in the past 7 days. The problem has been gradually worsening. The problem occurs hourly. The cough is non-productive. Associated symptoms include nasal congestion, postnasal drip, rhinorrhea, shortness of breath and wheezing. Pertinent negatives include no chest pain, chills, ear congestion, ear pain, fever, headaches, heartburn, hemoptysis, myalgias, rash, sore throat, sweats or weight loss. The symptoms are aggravated by pollens. Treatments tried: antihistamine. The treatment provided mild relief. Her past medical history is significant for asthma and bronchitis. There is no history of bronchiectasis, COPD, emphysema, environmental allergies or pneumonia.  Sinusitis This is a new problem. The current episode started 1 to 4 weeks ago. The problem has been gradually worsening since onset. There has been no fever. Associated symptoms include congestion, coughing, shortness of breath, sinus pressure and sneezing. Pertinent negatives include no chills, diaphoresis, ear pain, headaches, hoarse voice, neck pain or sore throat. Past treatments include nothing. The treatment provided no relief.    No problem-specific assessment & plan notes found for this encounter.   Past Medical History  Diagnosis Date  . Cancer ([place])     skin ca  . Diabetes mellitus without complication ([place])   . Hyperlipidemia   . Osteoporosis     Past Surgical History  Procedure Laterality Date  . Breast biopsy Right 06/22/11    bx/clip-neg  . Breast cyst aspiration Left     neg  . Partial hysterectomy    . Splenectomy    . Colonoscopy  2014    cleared for 5 yrs- [place]  doc    Family History  Problem Relation Age of Onset  . Breast cancer Sister 60  . Cancer Sister   . Heart disease Sister   . Cancer Father   . Stroke Father     Social History   Social History  . Marital Status: Married    Spouse Name: N/A  . Number of Children: N/A  . Years of Education: N/A   Occupational History  . Not on file.   Social History Main Topics  . Smoking status: Never Smoker   . Smokeless tobacco: Not on file  . Alcohol Use: No  . Drug Use: No  . Sexual Activity: Yes   Other Topics Concern  . Not on file   Social History Narrative    Allergies  Allergen Reactions  . Codeine Other (See Comments)     Review of Systems  Constitutional: Negative for fever, chills, weight loss, malaise/fatigue and diaphoresis.  HENT: Positive for congestion, postnasal drip, rhinorrhea, sinus pressure and sneezing. Negative for ear discharge, ear pain, hoarse voice and sore throat.   Eyes: Negative for blurred vision.  Respiratory: Positive for cough, shortness of breath and wheezing. Negative for hemoptysis and sputum production.   Cardiovascular: Negative for chest pain, palpitations and leg swelling.  Gastrointestinal: Negative for heartburn, nausea, abdominal pain, diarrhea, constipation, blood in stool and melena.  Genitourinary: Negative for dysuria, urgency, frequency and hematuria.  Musculoskeletal: Negative for myalgias, back pain, joint pain and neck pain.  Skin: Negative for rash.  Neurological: Negative for  dizziness, tingling, sensory change, focal weakness and headaches.  Endo/Heme/Allergies: Negative for environmental allergies and polydipsia. Does not bruise/bleed easily.  Psychiatric/Behavioral: Negative for depression and suicidal ideas. The patient is not nervous/anxious and does not have insomnia.      Objective  Filed Vitals:   03/20/16 1022  BP: 122/76  Pulse: 83  Temp: 97.1 F (36.2 C)  TempSrc: Oral  Resp: 16  Height: 5\' 3"  (1.6 m)   Weight: 189 lb 12.8 oz (86.093 kg)  SpO2: 94%    Physical Exam  Constitutional: She is well-developed, well-nourished, and in no distress. No distress.  HENT:  Head: Normocephalic and atraumatic.  Right Ear: External ear normal.  Left Ear: External ear normal.  Nose: Nose normal.  Mouth/Throat: Oropharynx is clear and moist.  Eyes: Conjunctivae and EOM are normal. Pupils are equal, round, and reactive to light. Right eye exhibits no discharge. Left eye exhibits no discharge.  Neck: Normal range of motion. Neck supple. No JVD present. No thyromegaly present.  Cardiovascular: Normal rate, regular rhythm, normal heart sounds and intact distal pulses.  Exam reveals no gallop and no friction rub.   No murmur heard. Pulmonary/Chest: Effort normal. No respiratory distress. She has wheezes. She has no rales.  Wheezes improved on nebulization  Abdominal: Soft. Bowel sounds are normal. She exhibits no mass. There is no tenderness. There is no guarding.  Musculoskeletal: Normal range of motion. She exhibits no edema.  Lymphadenopathy:    She has no cervical adenopathy.  Neurological: She is alert. She has normal reflexes.  Skin: Skin is warm and dry. She is not diaphoretic.  Psychiatric: Mood and affect normal.  Nursing note and vitals reviewed.     Assessment & Plan  Problem List Items Addressed This Visit    None    Visit Diagnoses    Cough    -  Primary    Relevant Medications    albuterol (PROVENTIL) (2.5 MG/3ML) 0.083% nebulizer solution 2.5 mg (Completed)    amoxicillin-clavulanate (AUGMENTIN) 875-125 MG tablet    Bronchitis        Relevant Medications    amoxicillin-clavulanate (AUGMENTIN) 875-125 MG tablet    predniSONE (DELTASONE) 10 MG tablet    Reactive airway disease, mild intermittent, uncomplicated        sample symbicort 140    Relevant Medications    predniSONE (DELTASONE) 10 MG tablet    albuterol (PROVENTIL HFA;VENTOLIN HFA) 108 (90 Base) MCG/ACT inhaler     montelukast (SINGULAIR) 10 MG tablet    Upper respiratory infection        Relevant Medications    amoxicillin-clavulanate (AUGMENTIN) 875-125 MG tablet    predniSONE (DELTASONE) 10 MG tablet         Dr. Deanna Jones [facility]  03/20/2016

## 2016-04-17 ENCOUNTER — Other Ambulatory Visit: Payer: Self-pay | Admitting: Family Medicine

## 2016-04-24 ENCOUNTER — Other Ambulatory Visit: Payer: Self-pay | Admitting: Family Medicine

## 2016-04-24 ENCOUNTER — Other Ambulatory Visit: Payer: Self-pay | Admitting: Obstetrics and Gynecology

## 2016-04-24 DIAGNOSIS — Z1231 Encounter for screening mammogram for malignant neoplasm of breast: Secondary | ICD-10-CM

## 2016-05-19 ENCOUNTER — Other Ambulatory Visit: Payer: Self-pay

## 2016-05-19 MED ORDER — METFORMIN HCL 500 MG PO TABS: 500.0000 mg | ORAL_TABLET | Freq: Two times a day (BID) | ORAL | Status: DC

## 2016-05-29 ENCOUNTER — Other Ambulatory Visit: Payer: Self-pay | Admitting: Family Medicine

## 2016-06-04 ENCOUNTER — Other Ambulatory Visit: Payer: Self-pay | Admitting: Family Medicine

## 2016-06-04 ENCOUNTER — Ambulatory Visit
Admission: RE | Admit: 2016-06-04 | Discharge: 2016-06-04 | Disposition: A | Discharge status: Home / Self Care | Payer: Medicare Other | Source: Ambulatory Visit | Attending: Family Medicine | Admitting: Family Medicine

## 2016-06-04 DIAGNOSIS — Z1231 Encounter for screening mammogram for malignant neoplasm of breast: Secondary | ICD-10-CM | POA: Insufficient documentation

## 2016-06-05 ENCOUNTER — Other Ambulatory Visit: Payer: Self-pay | Admitting: Family Medicine

## 2016-07-03 ENCOUNTER — Other Ambulatory Visit: Payer: Self-pay

## 2016-07-03 ENCOUNTER — Other Ambulatory Visit: Payer: Self-pay | Admitting: Family Medicine

## 2016-07-07 ENCOUNTER — Ambulatory Visit (INDEPENDENT_AMBULATORY_CARE_PROVIDER_SITE_OTHER): Payer: Medicare Other | Admitting: Family Medicine

## 2016-07-07 ENCOUNTER — Encounter: Payer: Self-pay | Admitting: Family Medicine

## 2016-07-07 VITALS — BP 122/76 | HR 80 | Ht 63.0 in | Wt 189.0 lb

## 2016-07-07 DIAGNOSIS — E119 Type 2 diabetes mellitus without complications: Secondary | ICD-10-CM

## 2016-07-07 DIAGNOSIS — E118 Type 2 diabetes mellitus with unspecified complications: Secondary | ICD-10-CM

## 2016-07-07 DIAGNOSIS — E7800 Pure hypercholesterolemia, unspecified: Secondary | ICD-10-CM

## 2016-07-07 DIAGNOSIS — M8580 Other specified disorders of bone density and structure, unspecified site: Secondary | ICD-10-CM | POA: Diagnosis not present

## 2016-07-07 MED ORDER — SIMVASTATIN 40 MG PO TABS: 40.0000 mg | ORAL_TABLET | Freq: Every day | ORAL | 1 refills | Status: DC

## 2016-07-07 MED ORDER — GLIPIZIDE ER 5 MG PO TB24: ORAL_TABLET | ORAL | 1 refills | Status: DC

## 2016-07-07 MED ORDER — METFORMIN HCL 500 MG PO TABS: 500.0000 mg | ORAL_TABLET | Freq: Two times a day (BID) | ORAL | 1 refills | Status: DC

## 2016-07-07 MED ORDER — PIOGLITAZONE HCL 15 MG PO TABS: 15.0000 mg | ORAL_TABLET | Freq: Every day | ORAL | 1 refills | Status: DC

## 2016-07-07 NOTE — Progress Notes (Signed)
Name: Lindsey George   MRN: [id number]    DOB: 1951/04/20   Date:07/07/2016       Progress Note  Subjective  Chief Complaint  Chief Complaint  Patient presents with  . Diabetes  . Hyperlipidemia  . Hypertension  . Osteoporosis    Diabetes  She presents for her follow-up diabetic visit. She has type 2 diabetes mellitus. Her disease course has been fluctuating. Pertinent negatives for hypoglycemia include no dizziness, headaches, nervousness/anxiousness or sweats. Pertinent negatives for diabetes include no blurred vision, no chest pain, no polydipsia and no weight loss. There are no diabetic complications. Pertinent negatives for diabetic complications include no CVA, PVD or retinopathy. Risk factors for coronary artery disease include dyslipidemia. Current diabetic treatment includes oral agent (dual therapy). She is following a generally healthy diet. She participates in exercise daily. Her breakfast blood glucose is taken between 8-9 am. Her breakfast blood glucose range is generally 140-180 mg/dl. She does not see a podiatrist.Eye exam is current.  Hyperlipidemia  This is a chronic problem. The current episode started more than 1 year ago. The problem is controlled. Recent lipid tests were reviewed and are normal. Exacerbating diseases include diabetes and obesity. She has no history of hypothyroidism, liver disease or nephrotic syndrome. There are no known factors aggravating her hyperlipidemia. Pertinent negatives include no chest pain, focal weakness, myalgias or shortness of breath. Current antihyperlipidemic treatment includes statins. There are no compliance problems.   Hypertension  This is a chronic problem. The current episode started more than 1 year ago. The problem has been gradually improving since onset. The problem is controlled. Pertinent negatives include no anxiety, blurred vision, chest pain, headaches, malaise/fatigue, neck pain, orthopnea, palpitations, peripheral edema,  PND, shortness of breath or sweats. There are no compliance problems.  There is no history of angina, kidney disease, CAD/MI, CVA, heart failure, left ventricular hypertrophy, PVD or retinopathy.    No problem-specific Assessment & Plan notes found for this encounter.   Past Medical History:  Diagnosis Date  . Cancer ([place])    skin ca  . Diabetes mellitus without complication ([place])   . Hyperlipidemia   . Osteoporosis     Past Surgical History:  Procedure Laterality Date  . BREAST BIOPSY Right 06/22/11   bx/clip-neg  . BREAST CYST ASPIRATION Left    neg  . COLONOSCOPY  2014   cleared for 5 yrs- [place] doc  . PARTIAL HYSTERECTOMY    . SPLENECTOMY      Family History  Problem Relation Age of Onset  . Breast cancer Sister 34  . Cancer Sister   . Heart disease Sister   . Cancer Father   . Stroke Father     Social History   Social History  . Marital status: Married    Spouse name: N/A  . Number of children: N/A  . Years of education: N/A   Occupational History  . Not on file.   Social History Main Topics  . Smoking status: Never Smoker  . Smokeless tobacco: Never Used  . Alcohol use No  . Drug use: No  . Sexual activity: Yes   Other Topics Concern  . Not on file   Social History Narrative  . No narrative on file    Allergies  Allergen Reactions  . Codeine Other (See Comments)     Review of Systems  Constitutional: Negative for chills, fever, malaise/fatigue and weight loss.  HENT: Negative for ear discharge, ear pain and sore throat.  Eyes: Negative for blurred vision.  Respiratory: Negative for cough, sputum production, shortness of breath and wheezing.   Cardiovascular: Negative for chest pain, palpitations, orthopnea, leg swelling and PND.  Gastrointestinal: Negative for abdominal pain, blood in stool, constipation, diarrhea, heartburn, melena and nausea.  Genitourinary: Negative for dysuria, frequency, hematuria and urgency.  Musculoskeletal:  Negative for back pain, joint pain, myalgias and neck pain.  Skin: Negative for rash.  Neurological: Negative for dizziness, tingling, sensory change, focal weakness and headaches.  Endo/Heme/Allergies: Negative for environmental allergies and polydipsia. Does not bruise/bleed easily.  Psychiatric/Behavioral: Negative for depression and suicidal ideas. The patient is not nervous/anxious and does not have insomnia.      Objective  Vitals:   07/07/16 0918  BP: 122/76  Pulse: 80  Weight: 189 lb (85.7 kg)  Height: 5\' 3"  (1.6 m)    Physical Exam  Constitutional: She is well-developed, well-nourished, and in no distress. No distress.  HENT:  Head: Normocephalic and atraumatic.  Right Ear: External ear normal.  Left Ear: External ear normal.  Nose: Nose normal.  Mouth/Throat: Oropharynx is clear and moist.  Eyes: Conjunctivae and EOM are normal. Pupils are equal, round, and reactive to light. Right eye exhibits no discharge. Left eye exhibits no discharge.  Neck: Normal range of motion. Neck supple. No JVD present. No thyromegaly present.  Cardiovascular: Normal rate, regular rhythm, normal heart sounds and intact distal pulses.  Exam reveals no gallop and no friction rub.   No murmur heard. Pulmonary/Chest: Effort normal and breath sounds normal.  Abdominal: Soft. Bowel sounds are normal. She exhibits no mass. There is no tenderness. There is no guarding.  Musculoskeletal: Normal range of motion. She exhibits no edema.  Lymphadenopathy:    She has no cervical adenopathy.  Neurological: She is alert. She has normal reflexes.  Skin: Skin is warm and dry. She is not diaphoretic.  Psychiatric: Mood and affect normal.      Assessment & Plan  Problem List Items Addressed This Visit      Endocrine   Diabetes mellitus ([place]) - Primary   Relevant Medications   metFORMIN (GLUCOPHAGE) 500 MG tablet   pioglitazone (ACTOS) 15 MG tablet   glipiZIDE (GLUCOTROL XL) 5 MG 24 hr tablet    simvastatin (ZOCOR) 40 MG tablet   Other Relevant Orders   Renal Function Panel   Hemoglobin A1c   Microalbumin / creatinine urine ratio     Other   Hypercholesterolemia   Relevant Medications   simvastatin (ZOCOR) 40 MG tablet   Other Relevant Orders   Lipid Profile    Other Visit Diagnoses    Osteopenia            Dr. Otilio Miu [facility]  07/07/16

## 2016-07-08 LAB — RENAL FUNCTION PANEL
ALBUMIN: 4.7 g/dL (ref 3.6–4.8)
BUN/Creatinine Ratio: 40 — ABNORMAL HIGH (ref 12–28)
BUN: 21 mg/dL (ref 8–27)
CALCIUM: 10.5 mg/dL — AB (ref 8.7–10.3)
CO2: 26 mmol/L (ref 18–29)
CREATININE: 0.53 mg/dL — AB (ref 0.57–1.00)
Chloride: 95 mmol/L — ABNORMAL LOW (ref 96–106)
GFR calc Af Amer: 114 mL/min/{1.73_m2} (ref >59)
GFR calc non Af Amer: 99 mL/min/{1.73_m2} (ref >59)
Glucose: 115 mg/dL — ABNORMAL HIGH (ref 65–99)
PHOSPHORUS: 4 mg/dL (ref 2.5–4.5)
POTASSIUM: 4.4 mmol/L (ref 3.5–5.2)
SODIUM: 138 mmol/L (ref 134–144)

## 2016-07-08 LAB — HEPATIC FUNCTION PANEL
ALT: 20 IU/L (ref 0–32)
AST: 17 IU/L (ref 0–40)
Alkaline Phosphatase: 49 IU/L (ref 39–117)
BILIRUBIN TOTAL: 0.3 mg/dL (ref 0.0–1.2)
BILIRUBIN, DIRECT: 0.1 mg/dL (ref 0.00–0.40)
TOTAL PROTEIN: 7.4 g/dL (ref 6.0–8.5)

## 2016-07-08 LAB — HEMOGLOBIN A1C
ESTIMATED AVERAGE GLUCOSE: 148 mg/dL
HEMOGLOBIN A1C: 6.8 % — AB (ref 4.8–5.6)

## 2016-07-08 LAB — MICROALBUMIN / CREATININE URINE RATIO
CREATININE, UR: 64.2 mg/dL
MICROALB/CREAT RATIO: 214.2 mg/g{creat} — AB (ref 0.0–30.0)
Microalbumin, Urine: 137.5 ug/mL

## 2016-07-08 LAB — LIPID PANEL
Chol/HDL Ratio: 3.1 ratio units (ref 0.0–4.4)
Cholesterol, Total: 154 mg/dL (ref 100–199)
HDL: 50 mg/dL (ref >39)
LDL CALC: 75 mg/dL (ref 0–99)
Triglycerides: 144 mg/dL (ref 0–149)
VLDL CHOLESTEROL CAL: 29 mg/dL (ref 5–40)

## 2016-07-10 ENCOUNTER — Other Ambulatory Visit: Payer: Self-pay

## 2016-07-20 ENCOUNTER — Other Ambulatory Visit: Payer: Self-pay

## 2016-08-19 ENCOUNTER — Encounter: Payer: Self-pay | Admitting: Family Medicine

## 2016-08-19 ENCOUNTER — Ambulatory Visit (INDEPENDENT_AMBULATORY_CARE_PROVIDER_SITE_OTHER): Payer: Medicare Other | Admitting: Family Medicine

## 2016-08-19 VITALS — BP 102/62 | HR 64 | Ht 63.0 in | Wt 190.0 lb

## 2016-08-19 DIAGNOSIS — H6123 Impacted cerumen, bilateral: Secondary | ICD-10-CM | POA: Diagnosis not present

## 2016-08-19 DIAGNOSIS — Z23 Encounter for immunization: Secondary | ICD-10-CM

## 2016-08-19 NOTE — Progress Notes (Signed)
Name: Lindsey George   MRN: [id number]    DOB: September 16, 1951   Date:08/19/2016       Progress Note  Subjective  Chief Complaint  Chief Complaint  Patient presents with  . Immunizations    TDAP and FLU    Otalgia   There is pain in both ears. This is a recurrent problem. The problem has been waxing and waning. There has been no fever. The pain is mild. Pertinent negatives include no abdominal pain, coughing, diarrhea, ear discharge, headaches, neck pain, rash or sore throat. She has tried nothing for the symptoms. The treatment provided moderate relief. There is no history of hearing loss.    No problem-specific Assessment & Plan notes found for this encounter.   Past Medical History:  Diagnosis Date  . Cancer ([place])    skin ca  . Diabetes mellitus without complication ([place])   . Hyperlipidemia   . Osteoporosis     Past Surgical History:  Procedure Laterality Date  . BREAST BIOPSY Right 06/22/11   bx/clip-neg  . BREAST CYST ASPIRATION Left    neg  . COLONOSCOPY  2014   cleared for 5 yrs- [place] doc  . PARTIAL HYSTERECTOMY    . SPLENECTOMY      Family History  Problem Relation Age of Onset  . Breast cancer Sister 18  . Cancer Sister   . Heart disease Sister   . Cancer Father   . Stroke Father     Social History   Social History  . Marital status: Married    Spouse name: N/A  . Number of children: N/A  . Years of education: N/A   Occupational History  . Not on file.   Social History Main Topics  . Smoking status: Never Smoker  . Smokeless tobacco: Never Used  . Alcohol use No  . Drug use: No  . Sexual activity: Yes   Other Topics Concern  . Not on file   Social History Narrative  . No narrative on file    Allergies  Allergen Reactions  . Codeine Other (See Comments)     Review of Systems  Constitutional: Negative for chills, fever, malaise/fatigue and weight loss.  HENT: Positive for ear pain. Negative for ear discharge and sore throat.    Eyes: Negative for blurred vision.  Respiratory: Negative for cough, sputum production, shortness of breath and wheezing.   Cardiovascular: Negative for chest pain, palpitations and leg swelling.  Gastrointestinal: Negative for abdominal pain, blood in stool, constipation, diarrhea, heartburn, melena and nausea.  Genitourinary: Negative for dysuria, frequency, hematuria and urgency.  Musculoskeletal: Negative for back pain, joint pain, myalgias and neck pain.  Skin: Negative for rash.  Neurological: Negative for dizziness, tingling, sensory change, focal weakness and headaches.  Endo/Heme/Allergies: Negative for environmental allergies and polydipsia. Does not bruise/bleed easily.  Psychiatric/Behavioral: Negative for depression and suicidal ideas. The patient is not nervous/anxious and does not have insomnia.      Objective  Vitals:   08/19/16 1019  BP: 102/62  Pulse: 64  Weight: 190 lb (86.2 kg)  Height: 5\' 3"  (1.6 m)    Physical Exam  Constitutional: She is well-developed, well-nourished, and in no distress. No distress.  HENT:  Head: Normocephalic and atraumatic.  Right Ear: Tympanic membrane and external ear normal.  Left Ear: External ear normal.  Nose: Nose normal.  Mouth/Throat: Oropharynx is clear and moist.  bilat cerumen build up L>R  Eyes: Conjunctivae and EOM are normal. Pupils are equal, round,  and reactive to light. Right eye exhibits no discharge. Left eye exhibits no discharge.  Neck: Normal range of motion. Neck supple. No JVD present. No thyromegaly present.  Cardiovascular: Normal rate, regular rhythm, normal heart sounds and intact distal pulses.  Exam reveals no gallop and no friction rub.   No murmur heard. Pulmonary/Chest: Effort normal and breath sounds normal.  Abdominal: Soft. Bowel sounds are normal. She exhibits no mass. There is no tenderness. There is no guarding.  Musculoskeletal: Normal range of motion. She exhibits no edema.  Lymphadenopathy:     She has no cervical adenopathy.  Neurological: She is alert. She has normal reflexes.  Skin: Skin is warm and dry. She is not diaphoretic.  Psychiatric: Mood and affect normal.  Nursing note and vitals reviewed.     Assessment & Plan  Problem List Items Addressed This Visit    None    Visit Diagnoses    Cerumen impaction, bilateral    -  Primary   Immunization due       Need for Tdap vaccination       Relevant Orders   Tdap vaccine greater than or equal to 7yo IM (Completed)   Flu vaccine need       Relevant Orders   Flu Vaccine QUAD 36+ mos PF IM (Fluarix & Fluzone Quad PF) (Completed)        Dr. [facility]  08/19/16

## 2016-12-14 ENCOUNTER — Other Ambulatory Visit: Payer: Self-pay | Admitting: Family Medicine

## 2016-12-14 DIAGNOSIS — E119 Type 2 diabetes mellitus without complications: Secondary | ICD-10-CM

## 2016-12-18 ENCOUNTER — Other Ambulatory Visit: Payer: Self-pay

## 2016-12-18 DIAGNOSIS — E119 Type 2 diabetes mellitus without complications: Secondary | ICD-10-CM

## 2016-12-18 MED ORDER — METFORMIN HCL 500 MG PO TABS: ORAL_TABLET | ORAL | 0 refills | Status: DC

## 2017-01-01 ENCOUNTER — Other Ambulatory Visit: Payer: Self-pay | Admitting: Family Medicine

## 2017-01-01 DIAGNOSIS — E7800 Pure hypercholesterolemia, unspecified: Secondary | ICD-10-CM

## 2017-01-07 ENCOUNTER — Encounter: Payer: Medicare Other | Admitting: Family Medicine

## 2017-01-12 ENCOUNTER — Ambulatory Visit (INDEPENDENT_AMBULATORY_CARE_PROVIDER_SITE_OTHER): Payer: Medicare Other | Admitting: Family Medicine

## 2017-01-12 VITALS — BP 130/80 | HR 72 | Ht 63.0 in | Wt 193.0 lb

## 2017-01-12 DIAGNOSIS — E7800 Pure hypercholesterolemia, unspecified: Secondary | ICD-10-CM | POA: Diagnosis not present

## 2017-01-12 DIAGNOSIS — J45909 Unspecified asthma, uncomplicated: Secondary | ICD-10-CM

## 2017-01-12 DIAGNOSIS — E669 Obesity, unspecified: Secondary | ICD-10-CM

## 2017-01-12 DIAGNOSIS — E118 Type 2 diabetes mellitus with unspecified complications: Secondary | ICD-10-CM | POA: Diagnosis not present

## 2017-01-12 DIAGNOSIS — S46811A Strain of other muscles, fascia and tendons at shoulder and upper arm level, right arm, initial encounter: Secondary | ICD-10-CM | POA: Diagnosis not present

## 2017-01-12 DIAGNOSIS — J4520 Mild intermittent asthma, uncomplicated: Secondary | ICD-10-CM

## 2017-01-12 DIAGNOSIS — E119 Type 2 diabetes mellitus without complications: Secondary | ICD-10-CM

## 2017-01-12 MED ORDER — ETODOLAC 400 MG PO TABS: 400.0000 mg | ORAL_TABLET | Freq: Two times a day (BID) | ORAL | 0 refills | Status: DC

## 2017-01-12 MED ORDER — METFORMIN HCL 500 MG PO TABS: ORAL_TABLET | ORAL | 2 refills | Status: DC

## 2017-01-12 MED ORDER — SIMVASTATIN 40 MG PO TABS: ORAL_TABLET | ORAL | 2 refills | Status: DC

## 2017-01-12 MED ORDER — PIOGLITAZONE HCL 15 MG PO TABS: 15.0000 mg | ORAL_TABLET | Freq: Every day | ORAL | 2 refills | Status: DC

## 2017-01-12 MED ORDER — ALBUTEROL SULFATE HFA 108 (90 BASE) MCG/ACT IN AERS: 2.0000 | INHALATION_SPRAY | Freq: Four times a day (QID) | RESPIRATORY_TRACT | 2 refills | Status: DC | PRN

## 2017-01-12 NOTE — Progress Notes (Signed)
Name: Lindsey George   MRN: [id number]    DOB: [birth year]   Date:01/12/2017       Progress Note  Subjective  Chief Complaint  Chief Complaint  Patient presents with  . Diabetes  . Hyperlipidemia  . Shoulder Pain    R) shoulder pain when raising arm all the way up    Diabetes  She presents for her follow-up diabetic visit. She has type 2 diabetes mellitus. Her disease course has been stable. Pertinent negatives for hypoglycemia include no confusion, dizziness, headaches, hunger, mood changes, nervousness/anxiousness, pallor, seizures, sleepiness, speech difficulty, sweats or tremors. Pertinent negatives for diabetes include no blurred vision, no chest pain, no fatigue, no foot paresthesias, no foot ulcerations, no polydipsia, no polyphagia, no polyuria, no visual change, no weakness and no weight loss. Symptoms are stable. There are no diabetic complications. Risk factors for coronary artery disease include diabetes mellitus and dyslipidemia. Current diabetic treatment includes oral agent (triple therapy). She is compliant with treatment all of the time. She is following a generally healthy diet. She has not had a previous visit with a dietitian. Her breakfast blood glucose is taken between 8-9 am. Her breakfast blood glucose range is generally 140-180 mg/dl. An ACE inhibitor/angiotensin II receptor blocker is not being taken. Eye exam is current.  Hyperlipidemia  This is a chronic problem. The current episode started more than 1 year ago. The problem is controlled. Recent lipid tests were reviewed and are normal. She has no history of chronic renal disease, diabetes, liver disease, obesity or nephrotic syndrome. There are no known factors aggravating her hyperlipidemia. Pertinent negatives include no chest pain, focal sensory loss, focal weakness, leg pain, myalgias or shortness of breath. Current antihyperlipidemic treatment includes statins. The current treatment provides mild improvement of  lipids. There are no compliance problems.   Shoulder Pain   The pain is present in the right shoulder. This is a new problem. The current episode started 1 to 4 weeks ago. The problem occurs daily. The problem has been waxing and waning. The pain is moderate. Associated symptoms include an inability to bear weight and stiffness. Pertinent negatives include no fever, joint locking, joint swelling or tingling. She has tried nothing for the symptoms. There is no history of diabetes.  Asthma  There is no chest tightness, cough, difficulty breathing, frequent throat clearing, hemoptysis, hoarse voice, shortness of breath, sputum production or wheezing. The problem occurs intermittently. Associated symptoms include postnasal drip and rhinorrhea. Pertinent negatives include no chest pain, dyspnea on exertion, ear congestion, ear pain, fever, headaches, heartburn, malaise/fatigue, myalgias, sore throat, sweats or weight loss. Her symptoms are aggravated by change in weather and URI. Her symptoms are alleviated by beta-agonist. Her past medical history is significant for asthma.    No problem-specific Assessment & Plan notes found for this encounter.   Past Medical History:  Diagnosis Date  . Cancer ([place])    skin ca  . Diabetes mellitus without complication ([place])   . Hyperlipidemia   . Osteoporosis     Past Surgical History:  Procedure Laterality Date  . BREAST BIOPSY Right 06/22/11   bx/clip-neg  . BREAST CYST ASPIRATION Left    neg  . COLONOSCOPY  2014   cleared for 5 yrs- [place] doc  . PARTIAL HYSTERECTOMY    . SPLENECTOMY      Family History  Problem Relation Age of Onset  . Breast cancer Sister 31  . Cancer Sister   . Heart disease Sister   .  Cancer Father   . Stroke Father     Social History   Social History  . Marital status: Married    Spouse name: N/A  . Number of children: N/A  . Years of education: N/A   Occupational History  . Not on file.   Social History Main Topics   . Smoking status: Never Smoker  . Smokeless tobacco: Never Used  . Alcohol use No  . Drug use: No  . Sexual activity: Yes   Other Topics Concern  . Not on file   Social History Narrative  . No narrative on file    Allergies  Allergen Reactions  . Codeine Other (See Comments)     Review of Systems  Constitutional: Negative for chills, fatigue, fever, malaise/fatigue and weight loss.  HENT: Positive for postnasal drip and rhinorrhea. Negative for ear discharge, ear pain, hoarse voice and sore throat.   Eyes: Negative for blurred vision.  Respiratory: Negative for cough, hemoptysis, sputum production, shortness of breath and wheezing.   Cardiovascular: Negative for chest pain, dyspnea on exertion, palpitations and leg swelling.  Gastrointestinal: Negative for abdominal pain, blood in stool, constipation, diarrhea, heartburn, melena and nausea.  Genitourinary: Negative for dysuria, frequency, hematuria and urgency.  Musculoskeletal: Positive for stiffness. Negative for back pain, joint pain, myalgias and neck pain.  Skin: Negative for pallor and rash.  Neurological: Negative for dizziness, tingling, tremors, sensory change, focal weakness, seizures, speech difficulty, weakness and headaches.  Endo/Heme/Allergies: Negative for environmental allergies, polydipsia and polyphagia. Does not bruise/bleed easily.  Psychiatric/Behavioral: Negative for confusion, depression and suicidal ideas. The patient is not nervous/anxious and does not have insomnia.      Objective  Vitals:   01/12/17 0936  BP: 130/80  Pulse: 72  Weight: 193 lb (87.5 kg)  Height: 5\' 3"  (1.6 m)    Physical Exam  Constitutional: She is well-developed, well-nourished, and in no distress. No distress.  HENT:  Head: Normocephalic and atraumatic.  Right Ear: External ear normal.  Left Ear: External ear normal.  Nose: Nose normal.  Mouth/Throat: Oropharynx is clear and moist.  Eyes: Conjunctivae and EOM are  normal. Pupils are equal, round, and reactive to light. Right eye exhibits no discharge. Left eye exhibits no discharge.  Neck: Normal range of motion. Neck supple. No JVD present. No thyromegaly present.  Cardiovascular: Normal rate, regular rhythm, normal heart sounds and intact distal pulses.  Exam reveals no gallop and no friction rub.   No murmur heard. Pulmonary/Chest: Effort normal and breath sounds normal. She has no wheezes. She has no rales.  Abdominal: Soft. Bowel sounds are normal. She exhibits no mass. There is no tenderness. There is no guarding.  Musculoskeletal: Normal range of motion. She exhibits no edema.  Lymphadenopathy:    She has no cervical adenopathy.  Neurological: She is alert. She has normal reflexes.  Skin: Skin is warm and dry. She is not diaphoretic.  Psychiatric: Mood and affect normal.  Nursing note and vitals reviewed.     Assessment & Plan  Problem List Items Addressed This Visit      Endocrine   Diabetes mellitus ([place]) - Primary   Relevant Medications   pioglitazone (ACTOS) 15 MG tablet   metFORMIN (GLUCOPHAGE) 500 MG tablet   simvastatin (ZOCOR) 40 MG tablet   Other Relevant Orders   Hemoglobin A1c   Renal Function Panel   Microalbumin / creatinine urine ratio     Other   Hypercholesterolemia   Relevant Medications  simvastatin (ZOCOR) 40 MG tablet   Other Relevant Orders   Lipid Profile   Alimentary obesity   Relevant Medications   pioglitazone (ACTOS) 15 MG tablet   metFORMIN (GLUCOPHAGE) 500 MG tablet    Other Visit Diagnoses    Reactive airway disease without complication, unspecified asthma severity, unspecified whether persistent       Strain of supraspinatus muscle or tendon, right, initial encounter       Relevant Medications   etodolac (LODINE) 400 MG tablet   Reactive airway disease, mild intermittent, uncomplicated       sample symbicort 140   Relevant Medications   albuterol (PROVENTIL HFA;VENTOLIN HFA) 108 (90 Base)  MCG/ACT inhaler        Dr. Deanna [facility]  01/12/17

## 2017-01-13 ENCOUNTER — Other Ambulatory Visit: Payer: Self-pay

## 2017-01-13 LAB — RENAL FUNCTION PANEL
Albumin: 4.8 g/dL (ref 3.6–4.8)
BUN / CREAT RATIO: 36 — AB (ref 12–28)
BUN: 17 mg/dL (ref 8–27)
CALCIUM: 10.2 mg/dL (ref 8.7–10.3)
CHLORIDE: 99 mmol/L (ref 96–106)
CO2: 25 mmol/L (ref 18–29)
Creatinine, Ser: 0.47 mg/dL — ABNORMAL LOW (ref 0.57–1.00)
GFR calc Af Amer: 119 mL/min/{1.73_m2} (ref >59)
GFR calc non Af Amer: 103 mL/min/{1.73_m2} (ref >59)
GLUCOSE: 113 mg/dL — AB (ref 65–99)
POTASSIUM: 4.8 mmol/L (ref 3.5–5.2)
Phosphorus: 4.1 mg/dL (ref 2.5–4.5)
SODIUM: 141 mmol/L (ref 134–144)

## 2017-01-13 LAB — HEMOGLOBIN A1C
Est. average glucose Bld gHb Est-mCnc: 154 mg/dL
HEMOGLOBIN A1C: 7 % — AB (ref 4.8–5.6)

## 2017-01-13 LAB — MICROALBUMIN / CREATININE URINE RATIO
CREATININE, UR: 48.4 mg/dL
Microalb/Creat Ratio: 246.3 mg/g creat — ABNORMAL HIGH (ref 0.0–30.0)
Microalbumin, Urine: 119.2 ug/mL

## 2017-01-13 LAB — LIPID PANEL
CHOL/HDL RATIO: 2.7 ratio (ref 0.0–4.4)
Cholesterol, Total: 135 mg/dL (ref 100–199)
HDL: 50 mg/dL (ref >39)
LDL CALC: 57 mg/dL (ref 0–99)
Triglycerides: 142 mg/dL (ref 0–149)
VLDL Cholesterol Cal: 28 mg/dL (ref 5–40)

## 2017-01-20 ENCOUNTER — Other Ambulatory Visit: Payer: Self-pay

## 2017-01-25 ENCOUNTER — Other Ambulatory Visit: Payer: Self-pay | Admitting: Obstetrics and Gynecology

## 2017-01-25 DIAGNOSIS — N6320 Unspecified lump in the left breast, unspecified quadrant: Secondary | ICD-10-CM

## 2017-01-26 ENCOUNTER — Other Ambulatory Visit: Payer: Self-pay | Admitting: Obstetrics and Gynecology

## 2017-01-26 DIAGNOSIS — N6320 Unspecified lump in the left breast, unspecified quadrant: Secondary | ICD-10-CM

## 2017-01-27 ENCOUNTER — Ambulatory Visit
Admission: RE | Admit: 2017-01-27 | Discharge: 2017-01-27 | Disposition: A | Discharge status: Home / Self Care | Payer: Medicare Other | Source: Ambulatory Visit | Attending: Obstetrics and Gynecology | Admitting: Obstetrics and Gynecology

## 2017-01-27 ENCOUNTER — Ambulatory Visit: Payer: Medicare Other

## 2017-01-27 DIAGNOSIS — N6320 Unspecified lump in the left breast, unspecified quadrant: Secondary | ICD-10-CM

## 2017-01-29 ENCOUNTER — Other Ambulatory Visit: Payer: Self-pay | Admitting: Family Medicine

## 2017-01-29 DIAGNOSIS — E119 Type 2 diabetes mellitus without complications: Secondary | ICD-10-CM

## 2017-01-29 DIAGNOSIS — E118 Type 2 diabetes mellitus with unspecified complications: Secondary | ICD-10-CM

## 2017-02-04 ENCOUNTER — Other Ambulatory Visit: Payer: Medicare Other

## 2017-02-04 ENCOUNTER — Ambulatory Visit: Payer: Medicare Other

## 2017-02-05 ENCOUNTER — Other Ambulatory Visit: Payer: Medicare Other

## 2017-02-17 ENCOUNTER — Encounter: Payer: Self-pay | Admitting: Family Medicine

## 2017-02-17 ENCOUNTER — Ambulatory Visit (INDEPENDENT_AMBULATORY_CARE_PROVIDER_SITE_OTHER): Payer: Medicare Other | Admitting: Family Medicine

## 2017-02-17 VITALS — BP 132/70 | HR 80 | Temp 98.2°F | Ht 63.0 in | Wt 193.0 lb

## 2017-02-17 DIAGNOSIS — J40 Bronchitis, not specified as acute or chronic: Secondary | ICD-10-CM

## 2017-02-17 DIAGNOSIS — J0100 Acute maxillary sinusitis, unspecified: Secondary | ICD-10-CM | POA: Diagnosis not present

## 2017-02-17 DIAGNOSIS — R202 Paresthesia of skin: Secondary | ICD-10-CM | POA: Diagnosis not present

## 2017-02-17 DIAGNOSIS — R251 Tremor, unspecified: Secondary | ICD-10-CM

## 2017-02-17 DIAGNOSIS — J4521 Mild intermittent asthma with (acute) exacerbation: Secondary | ICD-10-CM | POA: Diagnosis not present

## 2017-02-17 MED ORDER — AMOXICILLIN 500 MG PO CAPS: 500.0000 mg | ORAL_CAPSULE | Freq: Three times a day (TID) | ORAL | 0 refills | Status: DC

## 2017-02-17 MED ORDER — IPRATROPIUM-ALBUTEROL 0.5-2.5 (3) MG/3ML IN SOLN: 3.0000 mL | Freq: Once | RESPIRATORY_TRACT | Status: AC

## 2017-02-17 MED ORDER — PREDNISONE 10 MG PO TABS: 10.0000 mg | ORAL_TABLET | Freq: Every day | ORAL | 0 refills | Status: DC
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
## 2017-02-18 LAB — CBC WITH DIFFERENTIAL/PLATELET
BASOS ABS: 0.1 10*3/uL (ref 0.0–0.2)
Basos: 0 %
EOS (ABSOLUTE): 0.2 10*3/uL (ref 0.0–0.4)
Eos: 1 %
Hematocrit: 42.2 % (ref 34.0–46.6)
Hemoglobin: 13.3 g/dL (ref 11.1–15.9)
IMMATURE GRANS (ABS): 0 10*3/uL (ref 0.0–0.1)
Immature Granulocytes: 0 %
LYMPHS: 34 %
Lymphocytes Absolute: 3.8 10*3/uL — ABNORMAL HIGH (ref 0.7–3.1)
MCH: 25.7 pg — AB (ref 26.6–33.0)
MCHC: 31.5 g/dL (ref 31.5–35.7)
MCV: 82 fL (ref 79–97)
MONOS ABS: 0.9 10*3/uL (ref 0.1–0.9)
Monocytes: 8 %
NEUTROS ABS: 6.2 10*3/uL (ref 1.4–7.0)
Neutrophils: 57 %
PLATELETS: 405 10*3/uL — AB (ref 150–379)
RBC: 5.17 x10E6/uL (ref 3.77–5.28)
RDW: 14.3 % (ref 12.3–15.4)
WBC: 11.2 10*3/uL — ABNORMAL HIGH (ref 3.4–10.8)

## 2017-02-18 LAB — VITAMIN B12: VITAMIN B 12: 311 pg/mL (ref 232–1245)

## 2017-03-19 ENCOUNTER — Ambulatory Visit
Admission: RE | Admit: 2017-03-19 | Discharge: 2017-03-19 | Disposition: A | Discharge status: Home / Self Care | Payer: Medicare Other | Source: Ambulatory Visit | Attending: Family Medicine | Admitting: Family Medicine

## 2017-03-19 ENCOUNTER — Encounter: Payer: Self-pay | Admitting: Family Medicine

## 2017-03-19 ENCOUNTER — Ambulatory Visit (INDEPENDENT_AMBULATORY_CARE_PROVIDER_SITE_OTHER): Payer: Medicare Other | Admitting: Family Medicine

## 2017-03-19 VITALS — BP 120/70 | HR 90 | Ht 63.0 in | Wt 193.0 lb

## 2017-03-19 DIAGNOSIS — M1711 Unilateral primary osteoarthritis, right knee: Secondary | ICD-10-CM

## 2017-03-19 DIAGNOSIS — M25561 Pain in right knee: Secondary | ICD-10-CM | POA: Diagnosis not present

## 2017-03-19 NOTE — Progress Notes (Signed)
Name: Lindsey George   MRN: [id number]    DOB: 1951/07/08   Date:03/19/2017       Progress Note  Subjective  Chief Complaint  Chief Complaint  Patient presents with  . Knee Pain    feels like knee is swollen and "twisted to the right"    Knee Pain   The incident occurred more than 1 week ago (" if it's hurting at 1 mile,do another"). There was no injury mechanism. The pain is present in the right knee. The quality of the pain is described as aching. The pain has been fluctuating since onset. Pertinent negatives include no inability to bear weight or tingling. The symptoms are aggravated by movement and weight bearing. She has tried NSAIDs for the symptoms. The treatment provided moderate relief.    No problem-specific Assessment & Plan notes found for this encounter.   Past Medical History:  Diagnosis Date  . Cancer ([place])    skin ca  . Diabetes mellitus without complication ([place])   . Hyperlipidemia   . Osteoporosis     Past Surgical History:  Procedure Laterality Date  . BREAST BIOPSY Right 06/22/11   bx/clip-neg  . BREAST CYST ASPIRATION Left    neg  . COLONOSCOPY  2014   cleared for 5 yrs- [place] doc  . PARTIAL HYSTERECTOMY    . SPLENECTOMY      Family History  Problem Relation Age of Onset  . Breast cancer Sister 79  . Cancer Sister   . Heart disease Sister   . Cancer Father   . Stroke Father     Social History   Social History  . Marital status: Married    Spouse name: N/A  . Number of children: N/A  . Years of education: N/A   Occupational History  . Not on file.   Social History Main Topics  . Smoking status: Never Smoker  . Smokeless tobacco: Never Used  . Alcohol use No  . Drug use: No  . Sexual activity: Yes   Other Topics Concern  . Not on file   Social History Narrative  . No narrative on file    Allergies  Allergen Reactions  . Codeine Other (See Comments)    Outpatient Medications Prior to Visit  Medication Sig Dispense  Refill  . albuterol (PROVENTIL HFA;VENTOLIN HFA) 108 (90 Base) MCG/ACT inhaler Inhale 2 puffs into the lungs every 6 (six) hours as needed for wheezing or shortness of breath. 1 Inhaler 2  . albuterol (PROVENTIL) (2.5 MG/3ML) 0.083% nebulizer solution Take 3 mLs (2.5 mg total) by nebulization every 6 (six) hours as needed for wheezing or shortness of breath. 75 mL 12  . aspirin 81 MG tablet Take 81 mg by mouth daily.    . calcium-vitamin D (OSCAL WITH D) 500-200 MG-UNIT tablet Take 1 tablet by mouth.    . EPIPEN 2-PAK 0.3 MG/0.3ML SOAJ injection     . glipiZIDE (GLUCOTROL XL) 5 MG 24 hr tablet TAKE ONE TABLET BY MOUTH ONCE DAILY SCHEDULE  AN  APPOINTMENT  FOR  MED  REFILL 90 tablet 0  . loratadine (CLARITIN) 10 MG tablet Take 10 mg by mouth daily. otc    . metFORMIN (GLUCOPHAGE) 500 MG tablet TAKE ONE TABLET BY MOUTH TWICE DAILY CALL  AND  SCHEDULE  AN  APPOINTMENT 180 tablet 2  . Multiple Vitamin (MULTIVITAMIN) capsule Take 1 capsule by mouth daily.    . pioglitazone (ACTOS) 15 MG tablet Take 1 tablet (15 mg  total) by mouth daily. 90 tablet 2  . simvastatin (ZOCOR) 40 MG tablet TAKE ONE TABLET BY MOUTH ONCE DAILY SCHEDULE  AN  APPOINTMENT  FOR  REFILLS 90 tablet 2  . etodolac (LODINE) 400 MG tablet Take 1 tablet (400 mg total) by mouth 2 (two) times daily. (Patient not taking: Reported on 03/19/2017) 60 tablet 0  . amoxicillin (AMOXIL) 500 MG capsule Take 1 capsule (500 mg total) by mouth 3 (three) times daily. 30 capsule 0  . predniSONE (DELTASONE) 10 MG tablet Take 1 tablet (10 mg total) by mouth daily with breakfast. 30 tablet 0   Facility-Administered Medications Prior to Visit  Medication Dose Route Frequency Provider Last Rate Last Dose  . albuterol (PROVENTIL) (2.5 MG/3ML) 0.083% nebulizer solution 2.5 mg  2.5 mg Nebulization Once Juline Patch, MD      . ipratropium-albuterol (DUONEB) 0.5-2.5 (3) MG/3ML nebulizer solution 3 mL  3 mL Nebulization Once Juline Patch, MD        Review of  Systems  Constitutional: Negative for chills, fever, malaise/fatigue and weight loss.  HENT: Negative for ear discharge, ear pain and sore throat.   Eyes: Negative for blurred vision.  Respiratory: Negative for cough, sputum production, shortness of breath and wheezing.   Cardiovascular: Negative for chest pain, palpitations and leg swelling.  Gastrointestinal: Negative for abdominal pain, blood in stool, constipation, diarrhea, heartburn, melena and nausea.  Genitourinary: Negative for dysuria, frequency, hematuria and urgency.  Musculoskeletal: Negative for back pain, joint pain, myalgias and neck pain.  Skin: Negative for rash.  Neurological: Negative for dizziness, tingling, sensory change, focal weakness and headaches.  Endo/Heme/Allergies: Negative for environmental allergies and polydipsia. Does not bruise/bleed easily.  Psychiatric/Behavioral: Negative for depression and suicidal ideas. The patient is not nervous/anxious and does not have insomnia.      Objective  Vitals:   03/19/17 1359  BP: 120/70  Pulse: 90  Weight: 193 lb (87.5 kg)  Height: 5\' 3"  (1.6 m)    Physical Exam  Constitutional: She is well-developed, well-nourished, and in no distress. No distress.  HENT:  Head: Normocephalic and atraumatic.  Right Ear: External ear normal.  Left Ear: External ear normal.  Nose: Nose normal.  Mouth/Throat: Oropharynx is clear and moist.  Eyes: Conjunctivae and EOM are normal. Pupils are equal, round, and reactive to light. Right eye exhibits no discharge. Left eye exhibits no discharge.  Neck: Normal range of motion. Neck supple. No JVD present. No thyromegaly present.  Cardiovascular: Normal rate, regular rhythm, normal heart sounds and intact distal pulses.  Exam reveals no gallop and no friction rub.   No murmur heard. Pulmonary/Chest: Effort normal and breath sounds normal. She has no wheezes. She has no rales.  Abdominal: Soft. Bowel sounds are normal. She exhibits no  mass. There is no tenderness. There is no guarding.  Musculoskeletal: She exhibits no edema.       Right knee: She exhibits decreased range of motion and swelling. Tenderness found. Medial joint line and lateral joint line tenderness noted. No MCL, no LCL and no patellar tendon tenderness noted.  Lymphadenopathy:    She has no cervical adenopathy.  Neurological: She is alert. She has normal reflexes.  Skin: Skin is warm and dry. She is not diaphoretic.  Psychiatric: Mood and affect normal.  Nursing note and vitals reviewed.     Assessment & Plan  Problem List Items Addressed This Visit    None    Visit Diagnoses    Arthritis  of right knee    -  Primary   resume etodolac   Relevant Orders   DG Knee Complete 4 Views Right   Ambulatory referral to Orthopedic Surgery      No orders of the defined types were placed in this encounter.     Dr. [facility]  03/19/17

## 2017-03-29 ENCOUNTER — Other Ambulatory Visit: Payer: Self-pay

## 2017-04-21 ENCOUNTER — Other Ambulatory Visit: Payer: Self-pay | Admitting: Obstetrics and Gynecology

## 2017-04-21 DIAGNOSIS — Z1231 Encounter for screening mammogram for malignant neoplasm of breast: Secondary | ICD-10-CM

## 2017-04-30 ENCOUNTER — Other Ambulatory Visit: Payer: Self-pay | Admitting: Family Medicine

## 2017-04-30 DIAGNOSIS — E119 Type 2 diabetes mellitus without complications: Secondary | ICD-10-CM

## 2017-04-30 DIAGNOSIS — E118 Type 2 diabetes mellitus with unspecified complications: Secondary | ICD-10-CM

## 2017-06-07 ENCOUNTER — Ambulatory Visit
Admission: RE | Admit: 2017-06-07 | Discharge: 2017-06-07 | Disposition: A | Discharge status: Home / Self Care | Payer: Medicare Other | Source: Ambulatory Visit | Attending: Obstetrics and Gynecology | Admitting: Obstetrics and Gynecology

## 2017-06-07 DIAGNOSIS — Z1231 Encounter for screening mammogram for malignant neoplasm of breast: Secondary | ICD-10-CM

## 2017-06-18 ENCOUNTER — Other Ambulatory Visit: Payer: Self-pay | Admitting: Internal Medicine

## 2017-06-18 ENCOUNTER — Ambulatory Visit
Admission: RE | Admit: 2017-06-18 | Discharge: 2017-06-18 | Disposition: A | Discharge status: Home / Self Care | Payer: Medicare Other | Source: Ambulatory Visit | Attending: Internal Medicine | Admitting: Internal Medicine

## 2017-06-18 DIAGNOSIS — R059 Cough, unspecified: Secondary | ICD-10-CM

## 2017-06-18 DIAGNOSIS — R05 Cough: Secondary | ICD-10-CM | POA: Insufficient documentation

## 2017-06-30 LAB — PULMONARY FUNCTION TEST

## 2017-07-30 ENCOUNTER — Other Ambulatory Visit: Payer: Self-pay | Admitting: Family Medicine

## 2017-07-30 DIAGNOSIS — E118 Type 2 diabetes mellitus with unspecified complications: Secondary | ICD-10-CM

## 2017-07-30 DIAGNOSIS — E119 Type 2 diabetes mellitus without complications: Secondary | ICD-10-CM

## 2017-08-05 ENCOUNTER — Encounter: Payer: Self-pay | Admitting: Family Medicine

## 2017-08-05 ENCOUNTER — Ambulatory Visit (INDEPENDENT_AMBULATORY_CARE_PROVIDER_SITE_OTHER): Payer: Medicare Other | Admitting: Family Medicine

## 2017-08-05 VITALS — BP 120/78 | HR 80 | Ht 63.0 in | Wt 189.0 lb

## 2017-08-05 DIAGNOSIS — E119 Type 2 diabetes mellitus without complications: Secondary | ICD-10-CM

## 2017-08-05 DIAGNOSIS — Z1159 Encounter for screening for other viral diseases: Secondary | ICD-10-CM | POA: Diagnosis not present

## 2017-08-05 DIAGNOSIS — Z23 Encounter for immunization: Secondary | ICD-10-CM

## 2017-08-05 DIAGNOSIS — E7800 Pure hypercholesterolemia, unspecified: Secondary | ICD-10-CM

## 2017-08-05 DIAGNOSIS — E118 Type 2 diabetes mellitus with unspecified complications: Secondary | ICD-10-CM

## 2017-08-05 DIAGNOSIS — R69 Illness, unspecified: Secondary | ICD-10-CM | POA: Diagnosis not present

## 2017-08-05 MED ORDER — SIMVASTATIN 40 MG PO TABS: ORAL_TABLET | ORAL | 1 refills | Status: DC

## 2017-08-05 MED ORDER — METFORMIN HCL 500 MG PO TABS: ORAL_TABLET | ORAL | 1 refills | Status: DC

## 2017-08-05 MED ORDER — PIOGLITAZONE HCL 15 MG PO TABS: 15.0000 mg | ORAL_TABLET | Freq: Every day | ORAL | 1 refills | Status: DC

## 2017-08-05 MED ORDER — GLIPIZIDE ER 5 MG PO TB24: 5.0000 mg | ORAL_TABLET | Freq: Every day | ORAL | 1 refills | Status: DC

## 2017-08-05 NOTE — Progress Notes (Signed)
Name: Lindsey George   MRN: [id number]    DOB: March 04, 1951   Date:08/05/2017       Progress Note  Subjective  Chief Complaint  Chief Complaint  Patient presents with  . Diabetes  . Hyperlipidemia    Diabetes  She presents for her follow-up diabetic visit. She has type 2 diabetes mellitus. The initial diagnosis of diabetes was made 8 years ago. Her disease course has been stable. There are no hypoglycemic associated symptoms. Pertinent negatives for hypoglycemia include no dizziness, headaches or nervousness/anxiousness. Pertinent negatives for diabetes include no blurred vision, no chest pain, no fatigue, no foot paresthesias, no foot ulcerations, no polydipsia, no polyphagia, no polyuria, no visual change, no weakness and no weight loss. There are no hypoglycemic complications. Symptoms are stable. There are no diabetic complications. Risk factors for coronary artery disease include post-menopausal. Current diabetic treatment includes oral agent (triple therapy). She is compliant with treatment all of the time. Her weight is fluctuating minimally. She is following a generally healthy diet. She participates in exercise daily. There is no change in her home blood glucose trend. Her breakfast blood glucose is taken between 8-9 am. Her breakfast blood glucose range is generally 130-140 mg/dl. An ACE inhibitor/angiotensin II receptor blocker is not being taken. She does not see a podiatrist.Eye exam is current.  Hyperlipidemia  This is a chronic problem. The current episode started more than 1 month ago. The problem is controlled. Recent lipid tests were reviewed and are normal. She has no history of chronic renal disease, diabetes, hypothyroidism or liver disease. Factors aggravating her hyperlipidemia include thiazides. Pertinent negatives include no chest pain, focal weakness, myalgias or shortness of breath. Current antihyperlipidemic treatment includes statins. The current treatment provides mild  improvement of lipids. There are no compliance problems.     No problem-specific Assessment & Plan notes found for this encounter.   Past Medical History:  Diagnosis Date  . Cancer ([place])    skin ca  . Diabetes mellitus without complication ([place])   . Hyperlipidemia   . Osteoporosis     Past Surgical History:  Procedure Laterality Date  . BREAST BIOPSY Right 06/22/11   bx/clip-neg  . BREAST CYST ASPIRATION Left    neg  . COLONOSCOPY  2014   cleared for 5 yrs- [place] doc  . PARTIAL HYSTERECTOMY    . SPLENECTOMY      Family History  Problem Relation Age of Onset  . Breast cancer Sister 69  . Cancer Sister   . Heart disease Sister   . Cancer Father   . Stroke Father     Social History   Social History  . Marital status: Married    Spouse name: N/A  . Number of children: N/A  . Years of education: N/A   Occupational History  . Not on file.   Social History Main Topics  . Smoking status: Never Smoker  . Smokeless tobacco: Never Used  . Alcohol use No  . Drug use: No  . Sexual activity: Yes   Other Topics Concern  . Not on file   Social History Narrative  . No narrative on file    Allergies  Allergen Reactions  . Codeine Other (See Comments)    Outpatient Medications Prior to Visit  Medication Sig Dispense Refill  . albuterol (PROVENTIL HFA;VENTOLIN HFA) 108 (90 Base) MCG/ACT inhaler Inhale 2 puffs into the lungs every 6 (six) hours as needed for wheezing or shortness of breath. 1 Inhaler 2  .  albuterol (PROVENTIL) (2.5 MG/3ML) 0.083% nebulizer solution Take 3 mLs (2.5 mg total) by nebulization every 6 (six) hours as needed for wheezing or shortness of breath. 75 mL 12  . aspirin 81 MG tablet Take 81 mg by mouth daily.    . calcium-vitamin D (OSCAL WITH D) 500-200 MG-UNIT tablet Take 1 tablet by mouth.    . EPIPEN 2-PAK 0.3 MG/0.3ML SOAJ injection     . etodolac (LODINE) 400 MG tablet Take 1 tablet (400 mg total) by mouth 2 (two) times daily. 60 tablet 0   . loratadine (CLARITIN) 10 MG tablet Take 10 mg by mouth daily. otc    . Multiple Vitamin (MULTIVITAMIN) capsule Take 1 capsule by mouth daily.    Marland Kitchen GLIPIZIDE XL 5 MG 24 hr tablet TAKE 1 TABLET BY MOUTH ONCE DAILY 90 tablet 0  . metFORMIN (GLUCOPHAGE) 500 MG tablet TAKE ONE TABLET BY MOUTH TWICE DAILY CALL  AND  SCHEDULE  AN  APPOINTMENT (Patient taking differently: TAKE ONE TABLET BY MOUTH TWICE DAILY) 180 tablet 2  . pioglitazone (ACTOS) 15 MG tablet Take 1 tablet (15 mg total) by mouth daily. 90 tablet 2  . simvastatin (ZOCOR) 40 MG tablet TAKE ONE TABLET BY MOUTH ONCE DAILY SCHEDULE  AN  APPOINTMENT  FOR  REFILLS 90 tablet 2   Facility-Administered Medications Prior to Visit  Medication Dose Route Frequency Provider Last Rate Last Dose  . albuterol (PROVENTIL) (2.5 MG/3ML) 0.083% nebulizer solution 2.5 mg  2.5 mg Nebulization Once Jones, Deanna C, MD      . ipratropium-albuterol (DUONEB) 0.5-2.5 (3) MG/3ML nebulizer solution 3 mL  3 mL Nebulization Once Juline Patch, MD        Review of Systems  Constitutional: Negative for chills, fatigue, fever, malaise/fatigue and weight loss.  HENT: Negative for ear discharge, ear pain and sore throat.   Eyes: Negative for blurred vision.  Respiratory: Negative for cough, sputum production, shortness of breath and wheezing.   Cardiovascular: Negative for chest pain, palpitations and leg swelling.  Gastrointestinal: Negative for abdominal pain, blood in stool, constipation, diarrhea, heartburn, melena and nausea.  Genitourinary: Negative for dysuria, frequency, hematuria and urgency.  Musculoskeletal: Negative for back pain, joint pain, myalgias and neck pain.  Skin: Negative for rash.  Neurological: Negative for dizziness, tingling, sensory change, focal weakness, weakness and headaches.  Endo/Heme/Allergies: Negative for environmental allergies, polydipsia and polyphagia. Does not bruise/bleed easily.  Psychiatric/Behavioral: Negative for  depression and suicidal ideas. The patient is not nervous/anxious and does not have insomnia.      Objective  Vitals:   08/05/17 0914  BP: 120/78  Pulse: 80  Weight: 189 lb (85.7 kg)  Height: 5\' 3"  (1.6 m)    Physical Exam  Constitutional: She is well-developed, well-nourished, and in no distress. No distress.  HENT:  Head: Normocephalic and atraumatic.  Right Ear: External ear normal.  Left Ear: External ear normal.  Nose: Nose normal.  Mouth/Throat: Oropharynx is clear and moist.  Eyes: Pupils are equal, round, and reactive to light. Conjunctivae and EOM are normal. Right eye exhibits no discharge. Left eye exhibits no discharge.  Neck: Normal range of motion. Neck supple. No JVD present. No thyromegaly present.  Cardiovascular: Normal rate, regular rhythm, normal heart sounds and intact distal pulses.  Exam reveals no gallop and no friction rub.   No murmur heard. Pulmonary/Chest: Effort normal and breath sounds normal. She has no wheezes. She has no rales.  Abdominal: Soft. Bowel sounds are normal. She  exhibits no distension and no mass. There is no tenderness. There is no rebound and no guarding.  Musculoskeletal: Normal range of motion. She exhibits no edema.  Lymphadenopathy:    She has no cervical adenopathy.  Neurological: She is alert. She has normal reflexes.  Skin: Skin is warm and dry. She is not diaphoretic.  Psychiatric: Mood and affect normal.  Nursing note and vitals reviewed.     Assessment & Plan  Problem List Items Addressed This Visit      Endocrine   Diabetes mellitus ([place]) - Primary   Relevant Medications   simvastatin (ZOCOR) 40 MG tablet   glipiZIDE (GLIPIZIDE XL) 5 MG 24 hr tablet   metFORMIN (GLUCOPHAGE) 500 MG tablet   pioglitazone (ACTOS) 15 MG tablet   Other Relevant Orders   Renal Function Panel   Hemoglobin A1c   Microalbumin / creatinine urine ratio   Renal Function Panel   Hemoglobin A1c   Microalbumin / creatinine urine ratio      Other   Hypercholesterolemia   Relevant Medications   simvastatin (ZOCOR) 40 MG tablet   Other Relevant Orders   Lipid panel    Other Visit Diagnoses    Taking medication for chronic disease       Relevant Orders   Hepatic Function Panel (6)   Need for hepatitis C screening test       Relevant Orders   Hepatitis C antibody   Influenza vaccine needed       Relevant Orders   Flu vaccine HIGH DOSE PF (Completed)   Need for 23-polyvalent pneumococcal polysaccharide vaccine       Relevant Orders   Pneumococcal polysaccharide vaccine 23-valent greater than or equal to 2yo subcutaneous/IM (Completed)      Meds ordered this encounter  Medications  . simvastatin (ZOCOR) 40 MG tablet    Sig: TAKE ONE TABLET BY MOUTH ONCE DAILY SCHEDULE  AN  APPOINTMENT  FOR  REFILLS    Dispense:  90 tablet    Refill:  1  . glipiZIDE (GLIPIZIDE XL) 5 MG 24 hr tablet    Sig: Take 1 tablet (5 mg total) by mouth daily.    Dispense:  90 tablet    Refill:  1  . metFORMIN (GLUCOPHAGE) 500 MG tablet    Sig: TAKE ONE TABLET BY MOUTH TWICE DAILY CALL  AND  SCHEDULE  AN  APPOINTMENT    Dispense:  180 tablet    Refill:  1  . pioglitazone (ACTOS) 15 MG tablet    Sig: Take 1 tablet (15 mg total) by mouth daily.    Dispense:  90 tablet    Refill:  1      Dr. Otilio Miu [facility]  08/05/17

## 2017-08-06 ENCOUNTER — Other Ambulatory Visit: Payer: Self-pay | Admitting: Family Medicine

## 2017-08-06 DIAGNOSIS — E119 Type 2 diabetes mellitus without complications: Secondary | ICD-10-CM

## 2017-08-06 LAB — RENAL FUNCTION PANEL
Albumin: 4.8 g/dL (ref 3.6–4.8)
BUN / CREAT RATIO: 34 — AB (ref 12–28)
BUN: 18 mg/dL (ref 8–27)
CHLORIDE: 98 mmol/L (ref 96–106)
CO2: 25 mmol/L (ref 20–29)
Calcium: 10.1 mg/dL (ref 8.7–10.3)
Creatinine, Ser: 0.53 mg/dL — ABNORMAL LOW (ref 0.57–1.00)
GFR calc Af Amer: 114 mL/min/{1.73_m2} (ref >59)
GFR calc non Af Amer: 99 mL/min/{1.73_m2} (ref >59)
Glucose: 114 mg/dL — ABNORMAL HIGH (ref 65–99)
Phosphorus: 3.8 mg/dL (ref 2.5–4.5)
Potassium: 4.4 mmol/L (ref 3.5–5.2)
SODIUM: 139 mmol/L (ref 134–144)

## 2017-08-06 LAB — MICROALBUMIN / CREATININE URINE RATIO
CREATININE, UR: 71.6 mg/dL
Microalb/Creat Ratio: 285.2 mg/g creat — ABNORMAL HIGH (ref 0.0–30.0)
Microalbumin, Urine: 204.2 ug/mL

## 2017-08-06 LAB — HEPATIC FUNCTION PANEL (6)
ALT: 14 IU/L (ref 0–32)
AST: 15 IU/L (ref 0–40)
Alkaline Phosphatase: 52 IU/L (ref 39–117)
Bilirubin Total: 0.4 mg/dL (ref 0.0–1.2)
Bilirubin, Direct: 0.12 mg/dL (ref 0.00–0.40)

## 2017-08-06 LAB — LIPID PANEL
CHOL/HDL RATIO: 2.7 ratio (ref 0.0–4.4)
Cholesterol, Total: 130 mg/dL (ref 100–199)
HDL: 49 mg/dL (ref >39)
LDL Calculated: 48 mg/dL (ref 0–99)
Triglycerides: 166 mg/dL — ABNORMAL HIGH (ref 0–149)
VLDL Cholesterol Cal: 33 mg/dL (ref 5–40)

## 2017-08-06 LAB — HEMOGLOBIN A1C
ESTIMATED AVERAGE GLUCOSE: 148 mg/dL
HEMOGLOBIN A1C: 6.8 % — AB (ref 4.8–5.6)

## 2017-08-06 LAB — HEPATITIS C ANTIBODY

## 2017-08-06 MED ORDER — LISINOPRIL 2.5 MG PO TABS: 2.5000 mg | ORAL_TABLET | Freq: Every day | ORAL | 5 refills | Status: DC

## 2017-09-12 ENCOUNTER — Ambulatory Visit
Admission: EM | Admit: 2017-09-12 | Discharge: 2017-09-12 | Disposition: A | Discharge status: Home / Self Care | Payer: Medicare Other | Attending: Physician Assistant | Admitting: Physician Assistant

## 2017-09-12 ENCOUNTER — Ambulatory Visit (INDEPENDENT_AMBULATORY_CARE_PROVIDER_SITE_OTHER): Payer: Medicare Other

## 2017-09-12 DIAGNOSIS — R0789 Other chest pain: Secondary | ICD-10-CM

## 2017-09-12 DIAGNOSIS — R0782 Intercostal pain: Secondary | ICD-10-CM | POA: Diagnosis not present

## 2017-09-12 MED ORDER — MELOXICAM 7.5 MG PO TABS: 7.5000 mg | ORAL_TABLET | Freq: Two times a day (BID) | ORAL | 0 refills | Status: DC | PRN

## 2017-09-12 NOTE — ED Triage Notes (Signed)
Pt reports she was reaching into her deep freezer at 4:00pm yesterday, and she believes she "overextended" and injured her right rib cage. She tried Aleve, without relief. She denies hearing pop/crack. Denies dyspnea.

## 2017-09-12 NOTE — ED Provider Notes (Signed)
[facility]    CSN: [id number] Arrival date & time: 09/12/17  0998     History   Chief Complaint Chief Complaint  Patient presents with  . Rib Pain    HPI Lindsey George is a 66 y.o. female.   Patient is a 66 year old female who presents complaining of right-sided rib pain that began yesterday. Patient states she was cleaning out a deep freezer after the recent power outage. She reports she was reaching in to the freezer with her right hand and that having a lot of her weight along her right chest as it was resting against the edge of the freezer. Patient reports some pain with movement and is sore and tender. She is taking Aleve with no help. Patient denies any other chest pain or shortness of breath. Patient denies any other trauma or injury to her ribs.  Of note patient does have a history of osteopenia with low bone mass based on a bone density done back in 2017 showing a 10 year fracture risk of 7.2.      Past Medical History:  Diagnosis Date  . Cancer ([place])    skin ca  . Diabetes mellitus without complication ([place])   . Hyperlipidemia   . Osteoporosis     Patient Active Problem List   Diagnosis Date Noted  . Reactive airway disease without complication 33/82/5053  . Back ache 05/22/2015  . Alimentary obesity 05/22/2015  . Diabetes mellitus ([place]) 04/12/2014  . Hypercholesterolemia 04/12/2014  . OP (osteoporosis) 04/12/2014    Past Surgical History:  Procedure Laterality Date  . BREAST BIOPSY Right 06/22/11   bx/clip-neg  . BREAST CYST ASPIRATION Left    neg  . COLONOSCOPY  2014   cleared for 5 yrs- [place] doc  . PARTIAL HYSTERECTOMY    . SPLENECTOMY      OB History    No data available       Home Medications    Prior to Admission medications   Medication Sig Start Date End Date Taking? Authorizing Provider  albuterol (PROVENTIL HFA;VENTOLIN HFA) 108 (90 Base) MCG/ACT inhaler Inhale 2 puffs into the lungs every 6 (six) hours as  needed for wheezing or shortness of breath. 01/12/17  Yes Juline Patch, MD  albuterol (PROVENTIL) (2.5 MG/3ML) 0.083% nebulizer solution Take 3 mLs (2.5 mg total) by nebulization every 6 (six) hours as needed for wheezing or shortness of breath. 12/30/15  Yes Juline Patch, MD  aspirin 81 MG tablet Take 81 mg by mouth daily.   Yes [provider]  calcium-vitamin D (OSCAL WITH D) 500-200 MG-UNIT tablet Take 1 tablet by mouth.   Yes [provider]  EPIPEN 2-PAK 0.3 MG/0.3ML SOAJ injection  02/03/16  Yes [provider]  etodolac (LODINE) 400 MG tablet Take 1 tablet (400 mg total) by mouth 2 (two) times daily. 01/12/17  Yes Juline Patch, MD  glipiZIDE (GLIPIZIDE XL) 5 MG 24 hr tablet Take 1 tablet (5 mg total) by mouth daily. 08/05/17  Yes Juline Patch, MD  lisinopril (PRINIVIL,ZESTRIL) 2.5 MG tablet Take 1 tablet (2.5 mg total) by mouth daily. 08/06/17  Yes Juline Patch, MD  loratadine (CLARITIN) 10 MG tablet Take 10 mg by mouth daily. otc   Yes [provider]  metFORMIN (GLUCOPHAGE) 500 MG tablet TAKE ONE TABLET BY MOUTH TWICE DAILY CALL  AND  SCHEDULE  AN  APPOINTMENT 08/05/17  Yes Juline Patch, MD  Multiple Vitamin (MULTIVITAMIN) capsule Take  1 capsule by mouth daily.   Yes [provider]  pioglitazone (ACTOS) 15 MG tablet Take 1 tablet (15 mg total) by mouth daily. 08/05/17  Yes Juline Patch, MD  simvastatin (ZOCOR) 40 MG tablet TAKE ONE TABLET BY MOUTH ONCE DAILY SCHEDULE  AN  APPOINTMENT  FOR  REFILLS 08/05/17  Yes Juline Patch, MD  meloxicam (MOBIC) 7.5 MG tablet Take 1 tablet (7.5 mg total) by mouth every 12 (twelve) hours as needed for pain. 09/12/17   Luvenia Redden, PA-C    Family History Family History  Problem Relation Age of Onset  . Breast cancer Sister 61  . Cancer Sister   . Heart disease Sister   . Cancer Father   . Stroke Father     Social History Social History  Substance Use Topics  . Smoking status: Never  Smoker  . Smokeless tobacco: Never Used  . Alcohol use No     Allergies   Codeine   Review of Systems Review of Systems  As noted above in history of present illness. Other systems reviewed and found to be negative.   Physical Exam Triage Vital Signs ED Triage Vitals [09/12/17 0844]  Enc Vitals Group     BP 125/64     Pulse Rate 78     Resp 20     Temp 97.9 F (36.6 C)     Temp Source Oral     SpO2 97 %     Weight 189 lb (85.7 kg)     Height 5\' 3"  (1.6 m)     Head Circumference      Peak Flow      Pain Score 5     Pain Loc      Pain Edu?      Excl. in [place]?    No data found.   Updated Vital Signs BP 125/64 (BP Location: Left Arm)   Pulse 78   Temp 97.9 F (36.6 C) (Oral)   Resp 20   Ht 5\' 3"  (1.6 m)   Wt 189 lb (85.7 kg)   SpO2 97%   BMI 33.48 kg/m   Physical Exam  Constitutional: She appears well-developed and well-nourished. No distress.  Eyes: Pupils are equal, round, and reactive to light. EOM are normal.  Neck: Normal range of motion. Neck supple.  Cardiovascular: Normal rate, regular rhythm and normal heart sounds.   Pulmonary/Chest: Effort normal and breath sounds normal. No respiratory distress. She has no wheezes. She has no rales. She exhibits tenderness. She exhibits no crepitus.       UC Treatments / Results  Labs (all labs ordered are listed, but only abnormal results are displayed) Labs Reviewed - No data to display  EKG  EKG Interpretation None       Radiology Dg Ribs Unilateral W/chest Right  Result Date: 09/12/2017 CLINICAL DATA:  Right chest wall/rib pain EXAM: RIGHT RIBS AND CHEST - 3+ VIEW COMPARISON:  Chest radiographs dated 06/18/2017 FINDINGS: Lungs are clear.  No pleural effusion or pneumothorax. The heart is normal in size. No displaced right rib fracture is seen. IMPRESSION: No displaced right rib fracture is seen. No evidence of acute cardiopulmonary disease. Electronically Signed   By: Julian Hy M.D.   On:  09/12/2017 09:29    Procedures Procedures (including critical care time)  Medications Ordered in UC Medications - No data to display   Initial Impression / Assessment and Plan / UC Course  I have reviewed the triage  vital signs and the nursing notes.  Pertinent labs & imaging results that were available during my care of the patient were reviewed by me and considered in my medical decision making (see chart for details).     Patient with chest wall pain after reaching into a patient with right chest wall pain after reaching into a deep freeze yesterday. Patient with point tenderness right rest as well as tenderness with deep breath and stretching towards the left.   Final Clinical Impressions(s) / UC Diagnoses   Final diagnoses:  Right-sided chest wall pain    New Prescriptions New Prescriptions   MELOXICAM (MOBIC) 7.5 MG TABLET    Take 1 tablet (7.5 mg total) by mouth every 12 (twelve) hours as needed for pain.   Chest x-ray without acute fracture. Give patient prescription for meloxicam for pain. Recommend she not take any other NSAIDs while taking this medication. Also she did that, for pain. Give her information regarding chest wall pain including ice. Patient given instructions regarding symptoms to watch for that she would need to return to the ER.   Controlled Substance Prescriptions [place] Controlled Substance Registry consulted? Not Applicable   Luvenia Redden, PA-C    Luvenia Redden, PA-C 09/12/17 [phone]

## 2017-09-12 NOTE — Discharge Instructions (Signed)
-  meloxicam: 1 tablet every 12 hours as needed for pain. Do not take other similar medications like Aleve or ibuprofen when taking this medication -can also use Tylenol for pain -avoid aggravating movements and heavy lifting until pain better -return to clinic or ER should you develop worsening pain, shortness of breath, or cough

## 2017-11-10 ENCOUNTER — Ambulatory Visit (INDEPENDENT_AMBULATORY_CARE_PROVIDER_SITE_OTHER): Payer: Medicare Other

## 2017-11-10 DIAGNOSIS — J3089 Other allergic rhinitis: Secondary | ICD-10-CM | POA: Diagnosis not present

## 2017-11-16 ENCOUNTER — Ambulatory Visit (INDEPENDENT_AMBULATORY_CARE_PROVIDER_SITE_OTHER): Payer: Medicare Other

## 2017-11-16 DIAGNOSIS — J301 Allergic rhinitis due to pollen: Secondary | ICD-10-CM | POA: Diagnosis not present

## 2017-11-19 LAB — HM DIABETES EYE EXAM

## 2017-11-22 ENCOUNTER — Ambulatory Visit (INDEPENDENT_AMBULATORY_CARE_PROVIDER_SITE_OTHER): Payer: Medicare Other

## 2017-11-22 DIAGNOSIS — J3089 Other allergic rhinitis: Secondary | ICD-10-CM

## 2017-11-24 ENCOUNTER — Other Ambulatory Visit: Payer: Self-pay

## 2017-12-01 ENCOUNTER — Ambulatory Visit (INDEPENDENT_AMBULATORY_CARE_PROVIDER_SITE_OTHER): Payer: Medicare Other

## 2017-12-01 DIAGNOSIS — J301 Allergic rhinitis due to pollen: Secondary | ICD-10-CM | POA: Diagnosis not present

## 2017-12-08 ENCOUNTER — Ambulatory Visit (INDEPENDENT_AMBULATORY_CARE_PROVIDER_SITE_OTHER): Payer: Medicare Other

## 2017-12-08 DIAGNOSIS — J301 Allergic rhinitis due to pollen: Secondary | ICD-10-CM

## 2017-12-15 ENCOUNTER — Ambulatory Visit (INDEPENDENT_AMBULATORY_CARE_PROVIDER_SITE_OTHER): Payer: Medicare Other

## 2017-12-15 DIAGNOSIS — J309 Allergic rhinitis, unspecified: Secondary | ICD-10-CM

## 2017-12-22 ENCOUNTER — Ambulatory Visit (INDEPENDENT_AMBULATORY_CARE_PROVIDER_SITE_OTHER): Payer: Medicare Other

## 2017-12-22 DIAGNOSIS — J309 Allergic rhinitis, unspecified: Secondary | ICD-10-CM

## 2017-12-29 ENCOUNTER — Ambulatory Visit (INDEPENDENT_AMBULATORY_CARE_PROVIDER_SITE_OTHER): Payer: Medicare Other

## 2017-12-29 DIAGNOSIS — J301 Allergic rhinitis due to pollen: Secondary | ICD-10-CM | POA: Diagnosis not present

## 2017-12-29 DIAGNOSIS — J309 Allergic rhinitis, unspecified: Secondary | ICD-10-CM

## 2018-01-03 ENCOUNTER — Other Ambulatory Visit: Payer: Self-pay | Admitting: Family Medicine

## 2018-01-03 DIAGNOSIS — E119 Type 2 diabetes mellitus without complications: Secondary | ICD-10-CM

## 2018-01-03 DIAGNOSIS — E118 Type 2 diabetes mellitus with unspecified complications: Secondary | ICD-10-CM

## 2018-01-05 ENCOUNTER — Ambulatory Visit (INDEPENDENT_AMBULATORY_CARE_PROVIDER_SITE_OTHER): Payer: Medicare Other

## 2018-01-05 DIAGNOSIS — J301 Allergic rhinitis due to pollen: Secondary | ICD-10-CM

## 2018-01-12 ENCOUNTER — Ambulatory Visit (INDEPENDENT_AMBULATORY_CARE_PROVIDER_SITE_OTHER): Payer: Medicare Other

## 2018-01-12 DIAGNOSIS — J301 Allergic rhinitis due to pollen: Secondary | ICD-10-CM | POA: Diagnosis not present

## 2018-01-14 DIAGNOSIS — Z86010 Personal history of colonic polyps: Secondary | ICD-10-CM | POA: Insufficient documentation

## 2018-01-19 ENCOUNTER — Ambulatory Visit (INDEPENDENT_AMBULATORY_CARE_PROVIDER_SITE_OTHER): Payer: Medicare Other

## 2018-01-19 DIAGNOSIS — J301 Allergic rhinitis due to pollen: Secondary | ICD-10-CM | POA: Diagnosis not present

## 2018-01-19 DIAGNOSIS — J309 Allergic rhinitis, unspecified: Secondary | ICD-10-CM | POA: Insufficient documentation

## 2018-01-26 ENCOUNTER — Ambulatory Visit (INDEPENDENT_AMBULATORY_CARE_PROVIDER_SITE_OTHER): Payer: Medicare Other

## 2018-01-26 DIAGNOSIS — J301 Allergic rhinitis due to pollen: Secondary | ICD-10-CM

## 2018-01-28 ENCOUNTER — Other Ambulatory Visit: Payer: Self-pay | Admitting: Family Medicine

## 2018-01-28 DIAGNOSIS — E119 Type 2 diabetes mellitus without complications: Secondary | ICD-10-CM

## 2018-02-02 ENCOUNTER — Ambulatory Visit (INDEPENDENT_AMBULATORY_CARE_PROVIDER_SITE_OTHER): Payer: Medicare Other

## 2018-02-02 DIAGNOSIS — J301 Allergic rhinitis due to pollen: Secondary | ICD-10-CM | POA: Diagnosis not present

## 2018-02-09 ENCOUNTER — Ambulatory Visit (INDEPENDENT_AMBULATORY_CARE_PROVIDER_SITE_OTHER): Payer: Medicare Other

## 2018-02-09 DIAGNOSIS — J301 Allergic rhinitis due to pollen: Secondary | ICD-10-CM | POA: Diagnosis not present

## 2018-02-14 ENCOUNTER — Encounter: Payer: Self-pay | Admitting: Family Medicine

## 2018-02-14 ENCOUNTER — Ambulatory Visit: Payer: Medicare Other | Admitting: Family Medicine

## 2018-02-14 VITALS — BP 122/70 | HR 76 | Ht 63.0 in | Wt 190.0 lb

## 2018-02-14 DIAGNOSIS — I10 Essential (primary) hypertension: Secondary | ICD-10-CM | POA: Diagnosis not present

## 2018-02-14 DIAGNOSIS — E7800 Pure hypercholesterolemia, unspecified: Secondary | ICD-10-CM

## 2018-02-14 DIAGNOSIS — J4520 Mild intermittent asthma, uncomplicated: Secondary | ICD-10-CM | POA: Diagnosis not present

## 2018-02-14 DIAGNOSIS — E119 Type 2 diabetes mellitus without complications: Secondary | ICD-10-CM | POA: Diagnosis not present

## 2018-02-14 MED ORDER — SIMVASTATIN 40 MG PO TABS: ORAL_TABLET | ORAL | 1 refills | Status: DC

## 2018-02-14 MED ORDER — PIOGLITAZONE HCL 15 MG PO TABS: 15.0000 mg | ORAL_TABLET | Freq: Every day | ORAL | 1 refills | Status: DC

## 2018-02-14 MED ORDER — ALBUTEROL SULFATE HFA 108 (90 BASE) MCG/ACT IN AERS: 2.0000 | INHALATION_SPRAY | Freq: Four times a day (QID) | RESPIRATORY_TRACT | 2 refills | Status: DC | PRN

## 2018-02-14 MED ORDER — LISINOPRIL 2.5 MG PO TABS: 2.5000 mg | ORAL_TABLET | Freq: Every day | ORAL | 1 refills | Status: DC

## 2018-02-14 MED ORDER — GLIPIZIDE ER 5 MG PO TB24: 5.0000 mg | ORAL_TABLET | Freq: Every day | ORAL | 1 refills | Status: DC

## 2018-02-14 MED ORDER — METFORMIN HCL 500 MG PO TABS: ORAL_TABLET | ORAL | 1 refills | Status: DC

## 2018-02-14 NOTE — Progress Notes (Signed)
Name: Lindsey George   MRN: [id number]    DOB: 05-18-1951   Date:02/14/2018       Progress Note  Subjective  Chief Complaint  Chief Complaint  Patient presents with  . Diabetes  . Hyperlipidemia  . Hypertension    Diabetes  She presents for her follow-up diabetic visit. She has type 2 diabetes mellitus. Her disease course has been stable. There are no hypoglycemic associated symptoms. Pertinent negatives for hypoglycemia include no dizziness, headaches, nervousness/anxiousness or sweats. Pertinent negatives for diabetes include no blurred vision, no chest pain, no fatigue, no foot paresthesias, no foot ulcerations, no polydipsia, no polyphagia, no polyuria, no visual change, no weakness and no weight loss. There are no hypoglycemic complications. Symptoms are improving. There are no diabetic complications. Pertinent negatives for diabetic complications include no CVA, heart disease, nephropathy, peripheral neuropathy, PVD or retinopathy. Risk factors for coronary artery disease include obesity, dyslipidemia, hypertension and post-menopausal. Current diabetic treatment includes oral agent (triple therapy). She is compliant with treatment all of the time. Her weight is stable. She is following a generally healthy diet. Meal planning includes avoidance of concentrated sweets. She participates in exercise daily. Her home blood glucose trend is fluctuating minimally. Her breakfast blood glucose is taken between 8-9 am. Her breakfast blood glucose range is generally 130-140 mg/dl. An ACE inhibitor/angiotensin II receptor blocker is being taken. She does not see a podiatrist.Eye exam is current.  Hyperlipidemia  This is a chronic problem. The current episode started more than 1 year ago. The problem is controlled. Recent lipid tests were reviewed and are normal. Exacerbating diseases include diabetes. She has no history of chronic renal disease, hypothyroidism, liver disease, obesity or nephrotic  syndrome. There are no known factors aggravating her hyperlipidemia. Pertinent negatives include no chest pain, focal weakness, myalgias or shortness of breath. Current antihyperlipidemic treatment includes statins. The current treatment provides moderate improvement of lipids. There are no compliance problems.   Hypertension  This is a chronic problem. The current episode started more than 1 year ago. The problem has been waxing and waning since onset. The problem is uncontrolled. Pertinent negatives include no anxiety, blurred vision, chest pain, headaches, malaise/fatigue, neck pain, orthopnea, palpitations, peripheral edema, PND, shortness of breath or sweats. There are no associated agents to hypertension. Risk factors for coronary artery disease include obesity, diabetes mellitus and dyslipidemia. Past treatments include ACE inhibitors. The current treatment provides moderate improvement. There are no compliance problems.  There is no history of angina, kidney disease, CAD/MI, CVA, heart failure, left ventricular hypertrophy, PVD or retinopathy. There is no history of chronic renal disease, a hypertension causing med or renovascular disease.    No problem-specific Assessment & Plan notes found for this encounter.   Past Medical History:  Diagnosis Date  . Cancer ([place])    skin ca  . Diabetes mellitus without complication ([place])   . Hyperlipidemia   . Osteoporosis     Past Surgical History:  Procedure Laterality Date  . BREAST BIOPSY Right 06/22/11   bx/clip-neg  . BREAST CYST ASPIRATION Left    neg  . COLONOSCOPY  2014   cleared for 5 yrs- [place] doc  . PARTIAL HYSTERECTOMY    . SPLENECTOMY      Family History  Problem Relation Age of Onset  . Breast cancer Sister 92  . Cancer Sister   . Heart disease Sister   . Cancer Father   . Stroke Father     Social History  Socioeconomic History  . Marital status: Married    Spouse name: Not on file  . Number of children: Not on file   . Years of education: Not on file  . Highest education level: Not on file  Social Needs  . Financial resource strain: Not on file  . Food insecurity - worry: Not on file  . Food insecurity - inability: Not on file  . Transportation needs - medical: Not on file  . Transportation needs - non-medical: Not on file  Occupational History  . Not on file  Tobacco Use  . Smoking status: Never Smoker  . Smokeless tobacco: Never Used  Substance and Sexual Activity  . Alcohol use: No    Alcohol/week: 0.0 oz  . Drug use: No  . Sexual activity: Yes  Other Topics Concern  . Not on file  Social History Narrative  . Not on file    Allergies  Allergen Reactions  . Codeine Other (See Comments)    Outpatient Medications Prior to Visit  Medication Sig Dispense Refill  . albuterol (PROVENTIL) (2.5 MG/3ML) 0.083% nebulizer solution Take 3 mLs (2.5 mg total) by nebulization every 6 (six) hours as needed for wheezing or shortness of breath. 75 mL 12  . aspirin 81 MG tablet Take 81 mg by mouth daily.    . calcium-vitamin D (OSCAL WITH D) 500-200 MG-UNIT tablet Take 1 tablet by mouth.    . EPIPEN 2-PAK 0.3 MG/0.3ML SOAJ injection     . etodolac (LODINE) 400 MG tablet Take 1 tablet (400 mg total) by mouth 2 (two) times daily. 60 tablet 0  . loratadine (CLARITIN) 10 MG tablet Take 10 mg by mouth daily. otc    . Multiple Vitamin (MULTIVITAMIN) capsule Take 1 capsule by mouth daily.    Marland Kitchen albuterol (PROVENTIL HFA;VENTOLIN HFA) 108 (90 Base) MCG/ACT inhaler Inhale 2 puffs into the lungs every 6 (six) hours as needed for wheezing or shortness of breath. 1 Inhaler 2  . glipiZIDE (GLIPIZIDE XL) 5 MG 24 hr tablet Take 1 tablet (5 mg total) by mouth daily. 90 tablet 1  . lisinopril (PRINIVIL,ZESTRIL) 2.5 MG tablet TAKE 1 TABLET BY MOUTH ONCE DAILY 30 tablet 0  . metFORMIN (GLUCOPHAGE) 500 MG tablet TAKE ONE TABLET BY MOUTH TWICE DAILY CALL  AND  SCHEDULE  AN  APPOINTMENT 180 tablet 1  . pioglitazone (ACTOS) 15  MG tablet Take 1 tablet (15 mg total) by mouth daily. 90 tablet 1  . simvastatin (ZOCOR) 40 MG tablet TAKE ONE TABLET BY MOUTH ONCE DAILY SCHEDULE  AN  APPOINTMENT  FOR  REFILLS 90 tablet 1  . GLIPIZIDE XL 5 MG 24 hr tablet TAKE 1 TABLET BY MOUTH ONCE DAILY. SCHEDULE  AN  APPOINTMENT  FOR  MED  REFILL 30 tablet 0  . meloxicam (MOBIC) 7.5 MG tablet Take 1 tablet (7.5 mg total) by mouth every 12 (twelve) hours as needed for pain. 60 tablet 0   Facility-Administered Medications Prior to Visit  Medication Dose Route Frequency Provider Last Rate Last Dose  . albuterol (PROVENTIL) (2.5 MG/3ML) 0.083% nebulizer solution 2.5 mg  2.5 mg Nebulization Once Jones, Deanna C, MD      . ipratropium-albuterol (DUONEB) 0.5-2.5 (3) MG/3ML nebulizer solution 3 mL  3 mL Nebulization Once Juline Patch, MD        Review of Systems  Constitutional: Negative for chills, fatigue, fever, malaise/fatigue and weight loss.  HENT: Negative for ear discharge, ear pain and sore throat.  Eyes: Negative for blurred vision.  Respiratory: Negative for cough, sputum production, shortness of breath and wheezing.   Cardiovascular: Negative for chest pain, palpitations, orthopnea, leg swelling and PND.  Gastrointestinal: Negative for abdominal pain, blood in stool, constipation, diarrhea, heartburn, melena and nausea.  Genitourinary: Negative for dysuria, frequency, hematuria and urgency.  Musculoskeletal: Negative for back pain, joint pain, myalgias and neck pain.  Skin: Negative for rash.  Neurological: Negative for dizziness, tingling, sensory change, focal weakness, weakness and headaches.  Endo/Heme/Allergies: Negative for environmental allergies, polydipsia and polyphagia. Does not bruise/bleed easily.  Psychiatric/Behavioral: Negative for depression and suicidal ideas. The patient is not nervous/anxious and does not have insomnia.      Objective  Vitals:   02/14/18 0917  BP: 122/70  Pulse: 76  Weight: 190 lb  (86.2 kg)  Height: 5\' 3"  (1.6 m)    Physical Exam  Constitutional: She is well-developed, well-nourished, and in no distress. No distress.  HENT:  Head: Normocephalic and atraumatic.  Right Ear: External ear normal.  Left Ear: External ear normal.  Nose: Nose normal.  Mouth/Throat: Oropharynx is clear and moist.  Eyes: Conjunctivae and EOM are normal. Pupils are equal, round, and reactive to light. Right eye exhibits no discharge. Left eye exhibits no discharge.  Neck: Normal range of motion. Neck supple. No JVD present. No thyromegaly present.  Cardiovascular: Normal rate, regular rhythm, normal heart sounds and intact distal pulses. Exam reveals no gallop and no friction rub.  No murmur heard. Pulmonary/Chest: Effort normal and breath sounds normal. She has no wheezes. She has no rales.  Abdominal: Soft. Bowel sounds are normal. She exhibits no mass. There is no tenderness. There is no guarding.  Musculoskeletal: Normal range of motion. She exhibits no edema.  Lymphadenopathy:    She has no cervical adenopathy.  Neurological: She is alert. She has normal reflexes.  Skin: Skin is warm and dry. She is not diaphoretic.  Psychiatric: Mood and affect normal.  Nursing note and vitals reviewed.     Assessment & Plan  Problem List Items Addressed This Visit      Endocrine   Type 2 diabetes mellitus with complication, without long-term current use of insulin (HCC) - Primary   Relevant Medications   simvastatin (ZOCOR) 40 MG tablet   glipiZIDE (GLIPIZIDE XL) 5 MG 24 hr tablet   metFORMIN (GLUCOPHAGE) 500 MG tablet   lisinopril (PRINIVIL,ZESTRIL) 2.5 MG tablet   pioglitazone (ACTOS) 15 MG tablet     Other   Hypercholesterolemia   Relevant Medications   simvastatin (ZOCOR) 40 MG tablet   lisinopril (PRINIVIL,ZESTRIL) 2.5 MG tablet   Other Relevant Orders   Lipid panel    Other Visit Diagnoses    Reactive airway disease, mild intermittent, uncomplicated       sample symbicort  140   Relevant Medications   albuterol (PROVENTIL HFA;VENTOLIN HFA) 108 (90 Base) MCG/ACT inhaler   Essential hypertension       Relevant Medications   simvastatin (ZOCOR) 40 MG tablet   lisinopril (PRINIVIL,ZESTRIL) 2.5 MG tablet   Other Relevant Orders   Renal Function Panel      Meds ordered this encounter  Medications  . simvastatin (ZOCOR) 40 MG tablet    Sig: TAKE ONE TABLET BY MOUTH ONCE DAILY SCHEDULE  AN  APPOINTMENT  FOR  REFILLS    Dispense:  90 tablet    Refill:  1  . glipiZIDE (GLIPIZIDE XL) 5 MG 24 hr tablet    Sig: Take 1  tablet (5 mg total) by mouth daily.    Dispense:  90 tablet    Refill:  1  . metFORMIN (GLUCOPHAGE) 500 MG tablet    Sig: TAKE ONE TABLET BY MOUTH TWICE DAILY CALL  AND  SCHEDULE  AN  APPOINTMENT    Dispense:  180 tablet    Refill:  1  . lisinopril (PRINIVIL,ZESTRIL) 2.5 MG tablet    Sig: Take 1 tablet (2.5 mg total) by mouth daily.    Dispense:  90 tablet    Refill:  1    sched appt for med refills  . pioglitazone (ACTOS) 15 MG tablet    Sig: Take 1 tablet (15 mg total) by mouth daily.    Dispense:  90 tablet    Refill:  1  . albuterol (PROVENTIL HFA;VENTOLIN HFA) 108 (90 Base) MCG/ACT inhaler    Sig: Inhale 2 puffs into the lungs every 6 (six) hours as needed for wheezing or shortness of breath.    Dispense:  1 Inhaler    Refill:  2      Dr. Macon [facility]  02/14/18

## 2018-02-15 LAB — LIPID PANEL
CHOL/HDL RATIO: 2.8 ratio (ref 0.0–4.4)
Cholesterol, Total: 135 mg/dL (ref 100–199)
HDL: 49 mg/dL (ref >39)
LDL Calculated: 67 mg/dL (ref 0–99)
TRIGLYCERIDES: 96 mg/dL (ref 0–149)
VLDL Cholesterol Cal: 19 mg/dL (ref 5–40)

## 2018-02-15 LAB — RENAL FUNCTION PANEL
Albumin: 5.1 g/dL — ABNORMAL HIGH (ref 3.6–4.8)
BUN/Creatinine Ratio: 43 — ABNORMAL HIGH (ref 12–28)
BUN: 22 mg/dL (ref 8–27)
CHLORIDE: 96 mmol/L (ref 96–106)
CO2: 21 mmol/L (ref 20–29)
Calcium: 10.1 mg/dL (ref 8.7–10.3)
Creatinine, Ser: 0.51 mg/dL — ABNORMAL LOW (ref 0.57–1.00)
GFR, EST AFRICAN AMERICAN: 115 mL/min/{1.73_m2} (ref >59)
GFR, EST NON AFRICAN AMERICAN: 100 mL/min/{1.73_m2} (ref >59)
GLUCOSE: 108 mg/dL — AB (ref 65–99)
PHOSPHORUS: 4.1 mg/dL (ref 2.5–4.5)
POTASSIUM: 4.7 mmol/L (ref 3.5–5.2)
SODIUM: 138 mmol/L (ref 134–144)

## 2018-02-15 LAB — MICROALBUMIN / CREATININE URINE RATIO
CREATININE, UR: 74.7 mg/dL
MICROALB/CREAT RATIO: 200.1 mg/g{creat} — AB (ref 0.0–30.0)
Microalbumin, Urine: 149.5 ug/mL

## 2018-02-15 LAB — HEMOGLOBIN A1C
ESTIMATED AVERAGE GLUCOSE: 148 mg/dL
HEMOGLOBIN A1C: 6.8 % — AB (ref 4.8–5.6)

## 2018-02-16 ENCOUNTER — Ambulatory Visit (INDEPENDENT_AMBULATORY_CARE_PROVIDER_SITE_OTHER): Payer: Medicare Other

## 2018-02-16 DIAGNOSIS — J301 Allergic rhinitis due to pollen: Secondary | ICD-10-CM

## 2018-02-24 ENCOUNTER — Encounter: Payer: Self-pay | Admitting: Internal Medicine

## 2018-02-24 ENCOUNTER — Ambulatory Visit: Payer: Medicare Other | Admitting: Internal Medicine

## 2018-02-24 VITALS — BP 121/71 | HR 72 | Resp 16 | Ht 63.0 in | Wt 190.2 lb

## 2018-02-24 DIAGNOSIS — J301 Allergic rhinitis due to pollen: Secondary | ICD-10-CM | POA: Diagnosis not present

## 2018-02-24 DIAGNOSIS — J449 Chronic obstructive pulmonary disease, unspecified: Secondary | ICD-10-CM

## 2018-02-24 NOTE — Patient Instructions (Signed)
Allergies An allergy is when your body reacts to a substance in a way that is not normal. An allergic reaction can happen after you:  Eat something.  Breathe in something.  Touch something.  You can be allergic to:  Things that are only around during certain seasons, like molds and pollens.  Foods.  Drugs.  Insects.  Animal dander.  What are the signs or symptoms?  Puffiness (swelling). This may happen on the lips, face, tongue, mouth, or throat.  Sneezing.  Coughing.  Breathing loudly (wheezing).  Stuffy nose.  Tingling in the mouth.  A rash.  Itching.  Itchy, red, puffy areas of skin (hives).  Watery eyes.  Throwing up (vomiting).  Watery poop (diarrhea).  Dizziness.  Feeling faint or fainting.  Trouble breathing or swallowing.  A tight feeling in the chest.  A fast heartbeat. How is this diagnosed? Allergies can be diagnosed with:  A medical and family history.  Skin tests.  Blood tests.  A food diary. A food diary is a record of all the foods, drinks, and symptoms you have each day.  The results of an elimination diet. This diet involves making sure not to eat certain foods and then seeing what happens when you start eating them again.  How is this treated? There is no cure for allergies, but allergic reactions can be treated with medicine. Severe reactions usually need to be treated at a hospital. How is this prevented? The best way to prevent an allergic reaction is to avoid the thing you are allergic to. Allergy shots and medicines can also help prevent reactions in some cases. This information is not intended to replace advice given to you by your health care provider. Make sure you discuss any questions you have with your health care provider. Document Released: 03/13/2013 Document Revised: 07/13/2016 Document Reviewed: 08/28/2014 Elsevier Interactive Patient Education  2018 Elsevier Inc.  

## 2018-02-24 NOTE — Progress Notes (Signed)
[facility] [place], [place] [address]  Pulmonary Sleep Medicine  Office Visit Note  Patient Name: Lindsey George DOB: October 10, 1951 MRN [id number]  Date of Service: 02/24/2018  Complaints/HPI:   She is doing fairly well overall.  She has been on allergy shots and has been tolerating them well.  She has had no flare-ups over allergies.  She is chronic asthmatic bronchitis last pulmonary function was done several years ago showed a mild reduction to borderline normal.  She uses a rescue inhaler as needed for her breathing.  She does note exertional dyspnea at times.  ROS  General: (-) fever, (-) chills, (-) night sweats, (-) weakness Skin: (-) rashes, (-) itching,. Eyes: (-) visual changes, (-) redness, (-) itching. Nose and Sinuses: (-) nasal stuffiness or itchiness, (-) postnasal drip, (-) nosebleeds, (-) sinus trouble. Mouth and Throat: (-) sore throat, (-) hoarseness. Neck: (-) swollen glands, (-) enlarged thyroid, (-) neck pain. Respiratory: - cough, (-) bloody sputum, - shortness of breath, - wheezing. Cardiovascular: - ankle swelling, (-) chest pain. Lymphatic: (-) lymph node enlargement. Neurologic: (-) numbness, (-) tingling. Psychiatric: (-) anxiety, (-) depression   Current Medication: Outpatient Encounter Medications as of 02/24/2018  Medication Sig Note  . albuterol (PROVENTIL HFA;VENTOLIN HFA) 108 (90 Base) MCG/ACT inhaler Inhale 2 puffs into the lungs every 6 (six) hours as needed for wheezing or shortness of breath.   Marland Kitchen albuterol (PROVENTIL) (2.5 MG/3ML) 0.083% nebulizer solution Take 3 mLs (2.5 mg total) by nebulization every 6 (six) hours as needed for wheezing or shortness of breath.   Marland Kitchen aspirin 81 MG tablet Take 81 mg by mouth daily.   . calcium-vitamin D (OSCAL WITH D) 500-200 MG-UNIT tablet Take 1 tablet by mouth.   . EPIPEN 2-PAK 0.3 MG/0.3ML SOAJ injection  03/20/2016: Received from: External Pharmacy  . etodolac (LODINE) 400 MG  tablet Take 1 tablet (400 mg total) by mouth 2 (two) times daily.   Marland Kitchen glipiZIDE (GLIPIZIDE XL) 5 MG 24 hr tablet Take 1 tablet (5 mg total) by mouth daily.   Marland Kitchen lisinopril (PRINIVIL,ZESTRIL) 2.5 MG tablet Take 1 tablet (2.5 mg total) by mouth daily.   Marland Kitchen loratadine (CLARITIN) 10 MG tablet Take 10 mg by mouth daily. otc   . metFORMIN (GLUCOPHAGE) 500 MG tablet TAKE ONE TABLET BY MOUTH TWICE DAILY CALL  AND  SCHEDULE  AN  APPOINTMENT   . Multiple Vitamin (MULTIVITAMIN) capsule Take 1 capsule by mouth daily.   . pioglitazone (ACTOS) 15 MG tablet Take 1 tablet (15 mg total) by mouth daily.   . simvastatin (ZOCOR) 40 MG tablet TAKE ONE TABLET BY MOUTH ONCE DAILY SCHEDULE  AN  APPOINTMENT  FOR  REFILLS    Facility-Administered Encounter Medications as of 02/24/2018  Medication  . albuterol (PROVENTIL) (2.5 MG/3ML) 0.083% nebulizer solution 2.5 mg  . ipratropium-albuterol (DUONEB) 0.5-2.5 (3) MG/3ML nebulizer solution 3 mL    Surgical History: Past Surgical History:  Procedure Laterality Date  . BREAST BIOPSY Right 06/22/11   bx/clip-neg  . BREAST CYST ASPIRATION Left    neg  . COLONOSCOPY  2014   cleared for 5 yrs- [place] doc  . PARTIAL HYSTERECTOMY    . SPLENECTOMY      Medical History: Past Medical History:  Diagnosis Date  . Cancer ([place])    skin ca  . Diabetes mellitus without complication ([place])   . Hyperlipidemia   . Osteoporosis     Family History: Family History  Problem Relation Age  of Onset  . Breast cancer Sister 77  . Cancer Sister   . Heart disease Sister   . Cancer Father   . Stroke Father     Social History: Social History   Socioeconomic History  . Marital status: Married    Spouse name: Not on file  . Number of children: Not on file  . Years of education: Not on file  . Highest education level: Not on file  Occupational History  . Not on file  Social Needs  . Financial resource strain: Not on file  . Food insecurity:    Worry: Not on file    Inability:  Not on file  . Transportation needs:    Medical: Not on file    Non-medical: Not on file  Tobacco Use  . Smoking status: Never Smoker  . Smokeless tobacco: Never Used  Substance and Sexual Activity  . Alcohol use: No    Alcohol/week: 0.0 oz  . Drug use: No  . Sexual activity: Yes  Lifestyle  . Physical activity:    Days per week: Not on file    Minutes per session: Not on file  . Stress: Not on file  Relationships  . Social connections:    Talks on phone: Not on file    Gets together: Not on file    Attends religious service: Not on file    Active member of club or organization: Not on file    Attends meetings of clubs or organizations: Not on file    Relationship status: Not on file  . Intimate partner violence:    Fear of current or ex partner: Not on file    Emotionally abused: Not on file    Physically abused: Not on file    Forced sexual activity: Not on file  Other Topics Concern  . Not on file  Social History Narrative  . Not on file    Vital Signs: Blood pressure 121/71, pulse 72, resp. rate 16, height 5\' 3"  (1.6 m), weight 190 lb 3.2 oz (86.3 kg), SpO2 94 %.  Examination: General Appearance: The patient is well-developed, well-nourished, and in no distress. Skin: Gross inspection of skin unremarkable. Head: normocephalic, no gross deformities. Eyes: no gross deformities noted. ENT: ears appear grossly normal no exudates. Neck: Supple. No thyromegaly. No LAD. Respiratory: good air entry no rhonchi. Cardiovascular: Normal S1 and S2 without murmur or rub. Extremities: No cyanosis. pulses are equal. Neurologic: Alert and oriented. No involuntary movements.  LABS: Recent Results (from the past 2160 hour(s))  Hemoglobin A1c     Status: Abnormal   Collection Time: 02/14/18 10:14 AM  Result Value Ref Range   Hgb A1c MFr Bld 6.8 (H) 4.8 - 5.6 %    Comment:          Prediabetes: 5.7 - 6.4          Diabetes: >6.4          Glycemic control for adults with  diabetes: <7.0    Est. average glucose Bld gHb Est-mCnc 148 mg/dL  Lipid panel     Status: None   Collection Time: 02/14/18 10:14 AM  Result Value Ref Range   Cholesterol, Total 135 100 - 199 mg/dL   Triglycerides 96 0 - 149 mg/dL   HDL 49 >39 mg/dL   VLDL Cholesterol Cal 19 5 - 40 mg/dL   LDL Calculated 67 0 - 99 mg/dL   Chol/HDL Ratio 2.8 0.0 - 4.4 ratio    Comment:  T. Chol/HDL Ratio                                             Men  Women                               1/2 Avg.Risk  3.4    3.3                                   Avg.Risk  5.0    4.4                                2X Avg.Risk  9.6    7.1                                3X Avg.Risk 23.4   11.0   Microalbumin / creatinine urine ratio     Status: Abnormal   Collection Time: 02/14/18 10:14 AM  Result Value Ref Range   Creatinine, Urine 74.7 Not Estab. mg/dL   Microalbumin, Urine 149.5 Not Estab. ug/mL   Microalb/Creat Ratio 200.1 (H) 0.0 - 30.0 mg/g creat    Comment:                      Normal:                0.0 -  30.0                      Albuminuria:          31.0 - 300.0                      Clinical albuminuria:       >300.0   Renal Function Panel     Status: Abnormal   Collection Time: 02/14/18 10:14 AM  Result Value Ref Range   Glucose 108 (H) 65 - 99 mg/dL   BUN 22 8 - 27 mg/dL   Creatinine, Ser 0.51 (L) 0.57 - 1.00 mg/dL   GFR calc non Af Amer 100 >59 mL/min/1.73   GFR calc Af Amer 115 >59 mL/min/1.73   BUN/Creatinine Ratio 43 (H) 12 - 28   Sodium 138 134 - 144 mmol/L   Potassium 4.7 3.5 - 5.2 mmol/L   Chloride 96 96 - 106 mmol/L   CO2 21 20 - 29 mmol/L   Calcium 10.1 8.7 - 10.3 mg/dL   Phosphorus 4.1 2.5 - 4.5 mg/dL   Albumin 5.1 (H) 3.6 - 4.8 g/dL    Radiology: Dg Ribs Unilateral W/chest Right  Result Date: 09/12/2017 CLINICAL DATA:  Right chest wall/rib pain EXAM: RIGHT RIBS AND CHEST - 3+ VIEW COMPARISON:  Chest radiographs dated 06/18/2017 FINDINGS: Lungs  are clear.  No pleural effusion or pneumothorax. The heart is normal in size. No displaced right rib fracture is seen. IMPRESSION: No displaced right rib fracture is seen. No evidence of acute cardiopulmonary disease. Electronically Signed   By: Julian Hy M.D.   On: 09/12/2017 09:29    No results found.  No results found.  Assessment and Plan: Patient Active Problem List   Diagnosis Date Noted  . Allergic rhinitis 01/19/2018  . Hx of adenomatous colonic polyps 01/14/2018  . Reactive airway disease without complication 00/71/2197  . Back ache 05/22/2015  . Alimentary obesity 05/22/2015  . Type 2 diabetes mellitus with complication, without long-term current use of insulin ([place]) 04/12/2014  . Hypercholesterolemia 04/12/2014  . OP (osteoporosis) 04/12/2014    1. Allergic rhinitis she is on allergy shots will continue 2. Asthma/COPD last PFT was borderline. She has some SOB with exertion. She uses a rescue inhaler as needed 3. Obesity needs to work on weight loss 4. Difficulty with sleep. She has noted some changes in her sleep pattern in terms of getting to sleep she has to work on her sleep hygiene  General Counseling: I have discussed the findings of the evaluation and examination with Lindsey George.  I have also discussed any further diagnostic evaluation thatmay be needed or ordered today. Lindsey George verbalizes understanding of the findings of todays visit. We also reviewed her medications today and discussed drug interactions and side effects including but not limited excessive drowsiness and altered mental states. We also discussed that there is always a risk not just to her but also people around her. she has been encouraged to call the office with any questions or concerns that should arise related to todays visit.    Time spent: 39min  I have personally obtained a history, examined the patient, evaluated laboratory and imaging results, formulated the assessment and plan and  placed orders.    Allyne Gee, MD [facility] Pulmonary and Critical Care Sleep medicine

## 2018-03-02 ENCOUNTER — Ambulatory Visit (INDEPENDENT_AMBULATORY_CARE_PROVIDER_SITE_OTHER): Payer: Medicare Other

## 2018-03-02 DIAGNOSIS — J301 Allergic rhinitis due to pollen: Secondary | ICD-10-CM | POA: Diagnosis not present

## 2018-03-16 ENCOUNTER — Ambulatory Visit (INDEPENDENT_AMBULATORY_CARE_PROVIDER_SITE_OTHER): Payer: Medicare Other

## 2018-03-16 DIAGNOSIS — J301 Allergic rhinitis due to pollen: Secondary | ICD-10-CM

## 2018-03-21 ENCOUNTER — Other Ambulatory Visit: Payer: Self-pay

## 2018-03-25 ENCOUNTER — Ambulatory Visit
Admission: RE | Admit: 2018-03-25 | Discharge: 2018-03-25 | Disposition: A | Discharge status: Home / Self Care | Payer: Medicare Other | Source: Ambulatory Visit | Attending: Unknown Physician Specialty | Admitting: Unknown Physician Specialty

## 2018-03-25 ENCOUNTER — Ambulatory Visit: Payer: Medicare Other | Admitting: Anesthesiology

## 2018-03-25 ENCOUNTER — Encounter
Admission: RE | Disposition: A | Discharge status: Home / Self Care | Payer: Self-pay | Source: Ambulatory Visit | Attending: Unknown Physician Specialty

## 2018-03-25 ENCOUNTER — Encounter: Payer: Self-pay | Admitting: *Deleted

## 2018-03-25 DIAGNOSIS — K635 Polyp of colon: Secondary | ICD-10-CM | POA: Diagnosis not present

## 2018-03-25 DIAGNOSIS — K640 First degree hemorrhoids: Secondary | ICD-10-CM | POA: Diagnosis not present

## 2018-03-25 DIAGNOSIS — E785 Hyperlipidemia, unspecified: Secondary | ICD-10-CM | POA: Insufficient documentation

## 2018-03-25 DIAGNOSIS — Z7984 Long term (current) use of oral hypoglycemic drugs: Secondary | ICD-10-CM | POA: Diagnosis not present

## 2018-03-25 DIAGNOSIS — K529 Noninfective gastroenteritis and colitis, unspecified: Secondary | ICD-10-CM | POA: Diagnosis not present

## 2018-03-25 DIAGNOSIS — Z1211 Encounter for screening for malignant neoplasm of colon: Secondary | ICD-10-CM | POA: Diagnosis present

## 2018-03-25 DIAGNOSIS — E119 Type 2 diabetes mellitus without complications: Secondary | ICD-10-CM | POA: Insufficient documentation

## 2018-03-25 DIAGNOSIS — Z79899 Other long term (current) drug therapy: Secondary | ICD-10-CM | POA: Insufficient documentation

## 2018-03-25 DIAGNOSIS — I10 Essential (primary) hypertension: Secondary | ICD-10-CM | POA: Diagnosis not present

## 2018-03-25 DIAGNOSIS — Z7982 Long term (current) use of aspirin: Secondary | ICD-10-CM | POA: Insufficient documentation

## 2018-03-25 HISTORY — PX: COLONOSCOPY WITH PROPOFOL: SHX5780

## 2018-03-25 HISTORY — DX: Essential (primary) hypertension: I10

## 2018-03-25 HISTORY — DX: Obesity, unspecified: E66.9

## 2018-03-25 HISTORY — DX: Other chronic pain: G89.29

## 2018-03-25 HISTORY — DX: Dorsalgia, unspecified: M54.9

## 2018-03-25 HISTORY — DX: Unspecified asthma, uncomplicated: J45.909

## 2018-03-25 LAB — GLUCOSE, CAPILLARY: Glucose-Capillary: 147 mg/dL — ABNORMAL HIGH (ref 65–99)

## 2018-03-25 SURGERY — COLONOSCOPY WITH PROPOFOL
Anesthesia: General

## 2018-03-25 MED ORDER — SODIUM CHLORIDE 0.9 % IV SOLN
INTRAVENOUS | Status: DC
  Administered 2018-03-25: 1000 mL via INTRAVENOUS

## 2018-03-25 MED ORDER — PROPOFOL 500 MG/50ML IV EMUL
INTRAVENOUS | Status: AC
  Filled 2018-03-25: qty 50

## 2018-03-25 MED ORDER — PROPOFOL 10 MG/ML IV BOLUS
INTRAVENOUS | Status: DC | PRN
  Administered 2018-03-25: 30 mg via INTRAVENOUS

## 2018-03-25 MED ORDER — PROPOFOL 500 MG/50ML IV EMUL
INTRAVENOUS | Status: DC | PRN
  Administered 2018-03-25: 75 ug/kg/min via INTRAVENOUS

## 2018-03-25 MED ORDER — LACTATED RINGERS IV SOLN
INTRAVENOUS | Status: DC | PRN
  Administered 2018-03-25: 08:00:00 via INTRAVENOUS

## 2018-03-25 MED ORDER — SODIUM CHLORIDE 0.9 % IV SOLN: INTRAVENOUS | Status: DC

## 2018-03-25 NOTE — Anesthesia Preprocedure Evaluation (Signed)
Anesthesia Evaluation  Patient identified by MRN, date of birth, ID band Patient awake    Reviewed: Allergy & Precautions, H&P , NPO status , Patient's Chart, lab work & pertinent test results, reviewed documented beta blocker date and time   Airway Mallampati: II   Neck ROM: full    Dental  (+) Poor Dentition   Pulmonary neg pulmonary ROS, neg shortness of breath, asthma ,    Pulmonary exam normal        Cardiovascular Exercise Tolerance: Poor hypertension, On Medications negative cardio ROS Normal cardiovascular exam Rhythm:regular Rate:Normal     Neuro/Psych negative neurological ROS  negative psych ROS   GI/Hepatic negative GI ROS, Neg liver ROS,   Endo/Other  negative endocrine ROSdiabetes  Renal/GU negative Renal ROS  negative genitourinary   Musculoskeletal   Abdominal   Peds  Hematology negative hematology ROS (+)   Anesthesia Other Findings Past Medical History: No date: Asthma No date: Cancer ([place])     Comment:  skin ca No date: Chronic back pain No date: Diabetes mellitus without complication (HCC) No date: Hyperlipidemia No date: Hypertension No date: Obesity No date: Osteoporosis Past Surgical History: 06/22/11: BREAST BIOPSY; Right     Comment:  bx/clip-neg No date: BREAST CYST ASPIRATION; Left     Comment:  neg 2014: COLONOSCOPY     Comment:  cleared for 5 yrs- [place] doc No date: PARTIAL HYSTERECTOMY No date: SPLENECTOMY BMI    Body Mass Index:  33.66 kg/m     Reproductive/Obstetrics negative OB ROS                             Anesthesia Physical Anesthesia Plan  ASA: III  Anesthesia Plan: General   Post-op Pain Management:    Induction:   PONV Risk Score and Plan:   Airway Management Planned:   Additional Equipment:   Intra-op Plan:   Post-operative Plan:   Informed Consent: I have reviewed the patients History and Physical, chart, labs and  discussed the procedure including the risks, benefits and alternatives for the proposed anesthesia with the patient or authorized representative who has indicated his/her understanding and acceptance.   Dental Advisory Given  Plan Discussed with: CRNA  Anesthesia Plan Comments:         Anesthesia Quick Evaluation

## 2018-03-25 NOTE — Anesthesia Post-op Follow-up Note (Signed)
Anesthesia QCDR form completed.        

## 2018-03-25 NOTE — Op Note (Signed)
[facility] Gastroenterology Patient Name: Lindsey George Procedure Date: 03/25/2018 7:09 AM MRN: [id number] Account #: [id number] Date of Birth: [birth year] Admit Type: Outpatient Age: 67 Room: [facility] ENDO ROOM 2 Gender: Female Note Status: Finalized Procedure:            Colonoscopy Indications:          High risk colon cancer surveillance: Personal history                        of colonic polyps Providers:            Manya Silvas, MD Referring MD:         Juline Patch, MD (Referring MD) Medicines:            Propofol per Anesthesia Complications:        No immediate complications. Procedure:            Pre-Anesthesia Assessment:                       - After reviewing the risks and benefits, the patient                        was deemed in satisfactory condition to undergo the                        procedure.                       After obtaining informed consent, the colonoscope was                        passed under direct vision. Throughout the procedure,                        the patient's blood pressure, pulse, and oxygen                        saturations were monitored continuously. The                        Colonoscope was introduced through the anus and                        advanced to the the cecum, identified by appendiceal                        orifice and ileocecal valve. The colonoscopy was                        performed without difficulty. The patient tolerated the                        procedure well. The quality of the bowel preparation                        was good. Findings:      A small polyp was found in the ascending colon. The polyp was sessile.       The polyp was removed with a hot snare. Resection and retrieval were       complete.      A diminutive polyp was found  in the sigmoid colon. The polyp was       sessile. The polyp was removed with a jumbo cold forceps. Resection and       retrieval were complete.   A diminutive polyp was found in the descending colon. The polyp was       sessile. The polyp was removed with a jumbo cold forceps. Resection and       retrieval were complete.      Internal hemorrhoids were found during endoscopy. The hemorrhoids were       small and Grade I (internal hemorrhoids that do not prolapse).      The exam was otherwise without abnormality. Impression:           - One small polyp in the ascending colon, removed with                        a hot snare. Resected and retrieved.                       - One diminutive polyp in the sigmoid colon, removed                        with a jumbo cold forceps. Resected and retrieved.                       - One diminutive polyp in the descending colon, removed                        with a jumbo cold forceps. Resected and retrieved.                       - Internal hemorrhoids.                       - The examination was otherwise normal. Recommendation:       - Await pathology results. Manya Silvas, MD 03/25/2018 8:03:35 AM This report has been signed electronically. Number of Addenda: 0 Note Initiated On: 03/25/2018 7:09 AM Scope Withdrawal Time: 0 hours 14 minutes 9 seconds  Total Procedure Duration: 0 hours 21 minutes 6 seconds       [facility]

## 2018-03-25 NOTE — H&P (Signed)
Primary Care Physician:  Juline Patch, MD Primary Gastroenterologist:  Dr. Vira Agar  Pre-Procedure History & Physical: HPI:  Lindsey George is a 67 y.o. female is here for an colonoscopy.  For PH of colon polyps.   Past Medical History:  Diagnosis Date  . Asthma   . Cancer ([place])    skin ca  . Chronic back pain   . Diabetes mellitus without complication ([place])   . Hyperlipidemia   . Hypertension   . Obesity   . Osteoporosis     Past Surgical History:  Procedure Laterality Date  . BREAST BIOPSY Right 06/22/11   bx/clip-neg  . BREAST CYST ASPIRATION Left    neg  . COLONOSCOPY  2014   cleared for 5 yrs- [place] doc  . PARTIAL HYSTERECTOMY    . SPLENECTOMY      Prior to Admission medications   Medication Sig Start Date End Date Taking? Authorizing Provider  albuterol (PROVENTIL HFA;VENTOLIN HFA) 108 (90 Base) MCG/ACT inhaler Inhale 2 puffs into the lungs every 6 (six) hours as needed for wheezing or shortness of breath. 02/14/18   Juline Patch, MD  albuterol (PROVENTIL) (2.5 MG/3ML) 0.083% nebulizer solution Take 3 mLs (2.5 mg total) by nebulization every 6 (six) hours as needed for wheezing or shortness of breath. 12/30/15   Juline Patch, MD  aspirin 81 MG tablet Take 81 mg by mouth daily.    [provider]  calcium-vitamin D (OSCAL WITH D) 500-200 MG-UNIT tablet Take 1 tablet by mouth.    [provider]  EPIPEN 2-PAK 0.3 MG/0.3ML SOAJ injection  02/03/16   [provider]  etodolac (LODINE) 400 MG tablet Take 1 tablet (400 mg total) by mouth 2 (two) times daily. 01/12/17   Juline Patch, MD  glipiZIDE (GLIPIZIDE XL) 5 MG 24 hr tablet Take 1 tablet (5 mg total) by mouth daily. 02/14/18   Juline Patch, MD  lisinopril (PRINIVIL,ZESTRIL) 2.5 MG tablet Take 1 tablet (2.5 mg total) by mouth daily. 02/14/18   Juline Patch, MD  loratadine (CLARITIN) 10 MG tablet Take 10 mg by mouth daily. otc    [provider]  metFORMIN (GLUCOPHAGE)  500 MG tablet TAKE ONE TABLET BY MOUTH TWICE DAILY CALL  AND  SCHEDULE  AN  APPOINTMENT 02/14/18   Juline Patch, MD  Multiple Vitamin (MULTIVITAMIN) capsule Take 1 capsule by mouth daily.    [provider]  pioglitazone (ACTOS) 15 MG tablet Take 1 tablet (15 mg total) by mouth daily. 02/14/18   Juline Patch, MD  simvastatin (ZOCOR) 40 MG tablet TAKE ONE TABLET BY MOUTH ONCE DAILY SCHEDULE  AN  APPOINTMENT  FOR  REFILLS 02/14/18   Juline Patch, MD    Allergies as of 01/15/2018 - Review Complete 09/12/2017  Allergen Reaction Noted  . Codeine Other (See Comments) 08/30/2015    Family History  Problem Relation Age of Onset  . Breast cancer Sister 4  . Cancer Sister   . Heart disease Sister   . Cancer Father   . Stroke Father     Social History   Socioeconomic History  . Marital status: Married    Spouse name: Not on file  . Number of children: Not on file  . Years of education: Not on file  . Highest education level: Not on file  Occupational History  . Not on file  Social Needs  . Financial resource strain: Not on file  . Food  insecurity:    Worry: Not on file    Inability: Not on file  . Transportation needs:    Medical: Not on file    Non-medical: Not on file  Tobacco Use  . Smoking status: Never Smoker  . Smokeless tobacco: Never Used  Substance and Sexual Activity  . Alcohol use: No    Alcohol/week: 0.0 oz  . Drug use: No  . Sexual activity: Yes  Lifestyle  . Physical activity:    Days per week: Not on file    Minutes per session: Not on file  . Stress: Not on file  Relationships  . Social connections:    Talks on phone: Not on file    Gets together: Not on file    Attends religious service: Not on file    Active member of club or organization: Not on file    Attends meetings of clubs or organizations: Not on file    Relationship status: Not on file  . Intimate partner violence:    Fear of current or ex partner: Not on file    Emotionally  abused: Not on file    Physically abused: Not on file    Forced sexual activity: Not on file  Other Topics Concern  . Not on file  Social History Narrative  . Not on file    Review of Systems: See HPI, otherwise negative ROS  Physical Exam: BP 140/82   Pulse 79   Temp (!) 97.4 F (36.3 C) (Tympanic)   Resp 18   Ht 5\' 3"  (1.6 m)   Wt 86.2 kg (190 lb)   SpO2 96%   BMI 33.66 kg/m  General:   Alert,  pleasant and cooperative in NAD Head:  Normocephalic and atraumatic. Neck:  Supple; no masses or thyromegaly. Lungs:  Clear throughout to auscultation.    Heart:  Regular rate and rhythm. Abdomen:  Soft, nontender and nondistended. Normal bowel sounds, without guarding, and without rebound.   Neurologic:  Alert and  oriented x4;  grossly normal neurologically.  Impression/Plan: Lindsey George is here for an colonoscopy to be performed for [facility] colon polyps.  Risks, benefits, limitations, and alternatives regarding  colonoscopy have been reviewed with the patient.  Questions have been answered.  All parties agreeable.   Gaylyn Cheers, MD  03/25/2018, 7:33 AM

## 2018-03-25 NOTE — Anesthesia Postprocedure Evaluation (Signed)
Anesthesia Post Note  Patient: Lindsey George  Procedure(s) Performed: COLONOSCOPY WITH PROPOFOL (N/A )  Patient location during evaluation: PACU Anesthesia Type: General Level of consciousness: awake and alert Pain management: pain level controlled Vital Signs Assessment: post-procedure vital signs reviewed and stable Respiratory status: spontaneous breathing, nonlabored ventilation, respiratory function stable and patient connected to nasal cannula oxygen Cardiovascular status: blood pressure returned to baseline and stable Postop Assessment: no apparent nausea or vomiting Anesthetic complications: no     Last Vitals:  Vitals:   03/25/18 0824 03/25/18 0834  BP: 132/72 (!) 141/82  Pulse: 68 75  Resp: 18 (!) 21  Temp:    SpO2: 95% 95%    Last Pain:  Vitals:   03/25/18 0834  TempSrc:   PainSc: 0-No pain                 Molli Barrows

## 2018-03-25 NOTE — Transfer of Care (Signed)
Immediate Anesthesia Transfer of Care Note  Patient: Lindsey George  Procedure(s) Performed: COLONOSCOPY WITH PROPOFOL (N/A )  Patient Location: PACU and Endoscopy Unit  Anesthesia Type:General  Level of Consciousness: awake, alert  and oriented  Airway & Oxygen Therapy: Patient Spontanous Breathing  Post-op Assessment: Report given to RN and Post -op Vital signs reviewed and stable  Post vital signs: Reviewed and stable  Last Vitals:  Vitals Value Taken Time  BP 112/69 03/25/2018  8:04 AM  Temp    Pulse 78 03/25/2018  8:04 AM  Resp    SpO2 94 % 03/25/2018  8:04 AM  Vitals shown include unvalidated device data.  Last Pain:  Vitals:   03/25/18 0709  TempSrc: Tympanic  PainSc: 0-No pain         Complications: No apparent anesthesia complications

## 2018-03-28 ENCOUNTER — Encounter: Payer: Self-pay | Admitting: Unknown Physician Specialty

## 2018-03-29 LAB — SURGICAL PATHOLOGY

## 2018-03-30 ENCOUNTER — Ambulatory Visit (INDEPENDENT_AMBULATORY_CARE_PROVIDER_SITE_OTHER): Payer: Medicare Other

## 2018-03-30 DIAGNOSIS — J301 Allergic rhinitis due to pollen: Secondary | ICD-10-CM

## 2018-04-08 DIAGNOSIS — J301 Allergic rhinitis due to pollen: Secondary | ICD-10-CM | POA: Diagnosis not present

## 2018-04-13 ENCOUNTER — Ambulatory Visit (INDEPENDENT_AMBULATORY_CARE_PROVIDER_SITE_OTHER): Payer: Medicare Other

## 2018-04-13 DIAGNOSIS — J301 Allergic rhinitis due to pollen: Secondary | ICD-10-CM | POA: Diagnosis not present

## 2018-04-26 ENCOUNTER — Other Ambulatory Visit: Payer: Self-pay | Admitting: Obstetrics and Gynecology

## 2018-04-26 DIAGNOSIS — Z1231 Encounter for screening mammogram for malignant neoplasm of breast: Secondary | ICD-10-CM

## 2018-04-27 ENCOUNTER — Ambulatory Visit (INDEPENDENT_AMBULATORY_CARE_PROVIDER_SITE_OTHER): Payer: Medicare Other

## 2018-04-27 DIAGNOSIS — J301 Allergic rhinitis due to pollen: Secondary | ICD-10-CM

## 2018-04-28 ENCOUNTER — Other Ambulatory Visit: Payer: Self-pay | Admitting: Family Medicine

## 2018-04-28 DIAGNOSIS — E118 Type 2 diabetes mellitus with unspecified complications: Secondary | ICD-10-CM

## 2018-04-28 DIAGNOSIS — E119 Type 2 diabetes mellitus without complications: Secondary | ICD-10-CM

## 2018-05-11 ENCOUNTER — Ambulatory Visit (INDEPENDENT_AMBULATORY_CARE_PROVIDER_SITE_OTHER): Payer: Medicare Other

## 2018-05-11 DIAGNOSIS — J301 Allergic rhinitis due to pollen: Secondary | ICD-10-CM | POA: Diagnosis not present

## 2018-05-25 ENCOUNTER — Ambulatory Visit (INDEPENDENT_AMBULATORY_CARE_PROVIDER_SITE_OTHER): Payer: Medicare Other

## 2018-05-25 DIAGNOSIS — J301 Allergic rhinitis due to pollen: Secondary | ICD-10-CM | POA: Diagnosis not present

## 2018-06-01 ENCOUNTER — Ambulatory Visit (INDEPENDENT_AMBULATORY_CARE_PROVIDER_SITE_OTHER): Payer: Medicare Other

## 2018-06-01 VITALS — BP 124/70 | HR 91 | Temp 98.3°F | Ht 63.0 in | Wt 187.2 lb

## 2018-06-01 DIAGNOSIS — Z0000 Encounter for general adult medical examination without abnormal findings: Secondary | ICD-10-CM | POA: Diagnosis not present

## 2018-06-01 NOTE — Patient Instructions (Signed)
Lindsey George , Thank you for taking time to come for your Medicare Wellness Visit. I appreciate your ongoing commitment to your health goals. Please review the following plan we discussed and let me know if I can assist you in the future.   Screening recommendations/referrals: Colorectal Screening: Up to date Mammogram: Please keep your appointment as scheduled Bone Density: No longer required  Vision and Dental Exams: Recommended annual ophthalmology exams for early detection of glaucoma and other disorders of the eye Recommended annual dental exams for proper oral hygiene  Diabetic Exams: Recommended annual diabetic eye exams for early detection of retinopathy Recommended annual diabetic foot exams for early detection of peripheral neuropathy.  Diabetic Eye Exam: Up to date Diabetic Foot Exam: Up to date  Vaccinations: Influenza vaccine: Up to date Pneumococcal vaccine: Up to date Tdap vaccine: Up to date Shingles vaccine: Please call your insurance company to determine your out of pocket expense for the Shingrix vaccine. You may also receive this vaccine at your local pharmacy or Health Dept.  Advanced directives: Advance directive discussed with you today. I have provided a copy for you to complete at home and have notarized. Once this is complete please bring a copy in to our office so we can scan it into your chart.  Goals: Recommend to drink at least 6-8 8oz glasses of water per day.  Next appointment: Please schedule your Annual Wellness Visit with your Nurse Health Advisor in one year.  Preventive Care 28 Years and Older, Female Preventive care refers to lifestyle choices and visits with your health care provider that can promote health and wellness. What does preventive care include?  A yearly physical exam. This is also called an annual well check.  Dental exams once or twice a year.  Routine eye exams. Ask your health care provider how often you should have your  eyes checked.  Personal lifestyle choices, including:  Daily care of your teeth and gums.  Regular physical activity.  Eating a healthy diet.  Avoiding tobacco and drug use.  Limiting alcohol use.  Practicing safe sex.  Taking low-dose aspirin every day.  Taking vitamin and mineral supplements as recommended by your health care provider. What happens during an annual well check? The services and screenings done by your health care provider during your annual well check will depend on your age, overall health, lifestyle risk factors, and family history of disease. Counseling  Your health care provider may ask you questions about your:  Alcohol use.  Tobacco use.  Drug use.  Emotional well-being.  Home and relationship well-being.  Sexual activity.  Eating habits.  History of falls.  Memory and ability to understand (cognition).  Work and work Statistician.  Reproductive health. Screening  You may have the following tests or measurements:  Height, weight, and BMI.  Blood pressure.  Lipid and cholesterol levels. These may be checked every 5 years, or more frequently if you are over 82 years old.  Skin check.  Lung cancer screening. You may have this screening every year starting at age 41 if you have a 30-pack-year history of smoking and currently smoke or have quit within the past 15 years.  Fecal occult blood test (FOBT) of the stool. You may have this test every year starting at age 47.  Flexible sigmoidoscopy or colonoscopy. You may have a sigmoidoscopy every 5 years or a colonoscopy every 10 years starting at age 70.  Hepatitis C blood test.  Hepatitis B blood test.  Sexually  transmitted disease (STD) testing.  Diabetes screening. This is done by checking your blood sugar (glucose) after you have not eaten for a while (fasting). You may have this done every 1-3 years.  Bone density scan. This is done to screen for osteoporosis. You may have this  done starting at age 22.  Mammogram. This may be done every 1-2 years. Talk to your health care provider about how often you should have regular mammograms. Talk with your health care provider about your test results, treatment options, and if necessary, the need for more tests. Vaccines  Your health care provider may recommend certain vaccines, such as:  Influenza vaccine. This is recommended every year.  Tetanus, diphtheria, and acellular pertussis (Tdap, Td) vaccine. You may need a Td booster every 10 years.  Zoster vaccine. You may need this after age 25.  Pneumococcal 13-valent conjugate (PCV13) vaccine. One dose is recommended after age 6.  Pneumococcal polysaccharide (PPSV23) vaccine. One dose is recommended after age 75. Talk to your health care provider about which screenings and vaccines you need and how often you need them. This information is not intended to replace advice given to you by your health care provider. Make sure you discuss any questions you have with your health care provider. Document Released: 12/13/2015 Document Revised: 08/05/2016 Document Reviewed: 09/17/2015 Elsevier Interactive Patient Education  2017 [employer] Prevention in the Home Falls can cause injuries. They can happen to people of all ages. There are many things you can do to make your home safe and to help prevent falls. What can I do on the outside of my home?  Regularly fix the edges of walkways and driveways and fix any cracks.  Remove anything that might make you trip as you walk through a door, such as a raised step or threshold.  Trim any bushes or trees on the path to your home.  Use bright outdoor lighting.  Clear any walking paths of anything that might make someone trip, such as rocks or tools.  Regularly check to see if handrails are loose or broken. Make sure that both sides of any steps have handrails.  Any raised decks and porches should have guardrails on the  edges.  Have any leaves, snow, or ice cleared regularly.  Use sand or salt on walking paths during winter.  Clean up any spills in your garage right away. This includes oil or grease spills. What can I do in the bathroom?  Use night lights.  Install grab bars by the toilet and in the tub and shower. Do not use towel bars as grab bars.  Use non-skid mats or decals in the tub or shower.  If you need to sit down in the shower, use a plastic, non-slip stool.  Keep the floor dry. Clean up any water that spills on the floor as soon as it happens.  Remove soap buildup in the tub or shower regularly.  Attach bath mats securely with double-sided non-slip rug tape.  Do not have throw rugs and other things on the floor that can make you trip. What can I do in the bedroom?  Use night lights.  Make sure that you have a light by your bed that is easy to reach.  Do not use any sheets or blankets that are too big for your bed. They should not hang down onto the floor.  Have a firm chair that has side arms. You can use this for support while you get dressed.  Do not have throw rugs and other things on the floor that can make you trip. What can I do in the kitchen?  Clean up any spills right away.  Avoid walking on wet floors.  Keep items that you use a lot in easy-to-reach places.  If you need to reach something above you, use a strong step stool that has a grab bar.  Keep electrical cords out of the way.  Do not use floor polish or wax that makes floors slippery. If you must use wax, use non-skid floor wax.  Do not have throw rugs and other things on the floor that can make you trip. What can I do with my stairs?  Do not leave any items on the stairs.  Make sure that there are handrails on both sides of the stairs and use them. Fix handrails that are broken or loose. Make sure that handrails are as long as the stairways.  Check any carpeting to make sure that it is firmly  attached to the stairs. Fix any carpet that is loose or worn.  Avoid having throw rugs at the top or bottom of the stairs. If you do have throw rugs, attach them to the floor with carpet tape.  Make sure that you have a light switch at the top of the stairs and the bottom of the stairs. If you do not have them, ask someone to add them for you. What else can I do to help prevent falls?  Wear shoes that:  Do not have high heels.  Have rubber bottoms.  Are comfortable and fit you well.  Are closed at the toe. Do not wear sandals.  If you use a stepladder:  Make sure that it is fully opened. Do not climb a closed stepladder.  Make sure that both sides of the stepladder are locked into place.  Ask someone to hold it for you, if possible.  Clearly mark and make sure that you can see:  Any grab bars or handrails.  First and last steps.  Where the edge of each step is.  Use tools that help you move around (mobility aids) if they are needed. These include:  Canes.  Walkers.  Scooters.  Crutches.  Turn on the lights when you go into a dark area. Replace any light bulbs as soon as they burn out.  Set up your furniture so you have a clear path. Avoid moving your furniture around.  If any of your floors are uneven, fix them.  If there are any pets around you, be aware of where they are.  Review your medicines with your doctor. Some medicines can make you feel dizzy. This can increase your chance of falling. Ask your doctor what other things that you can do to help prevent falls. This information is not intended to replace advice given to you by your health care provider. Make sure you discuss any questions you have with your health care provider. Document Released: 09/12/2009 Document Revised: 04/23/2016 Document Reviewed: 12/21/2014 Elsevier Interactive Patient Education  2017 [employer].

## 2018-06-01 NOTE — Progress Notes (Signed)
Subjective:   Lindsey George is a 67 y.o. female who presents for an Initial Medicare Annual Wellness Visit.  Review of Systems    N/A  Cardiac Risk Factors include: advanced age (>82men, >56 women);diabetes mellitus;dyslipidemia;hypertension;obesity (BMI >30kg/m2);sedentary lifestyle     Objective:    Today's Vitals   06/01/18 1454  BP: 124/70  Pulse: 91  Temp: 98.3 F (36.8 C)  TempSrc: Oral  SpO2: 90%  Weight: 187 lb 3.2 oz (84.9 kg)  Height: 5\' 3"  (1.6 m)   Body mass index is 33.16 kg/m.  Advanced Directives 06/01/2018 03/25/2018 08/30/2015  Does Patient Have a Medical Advance Directive? No No No  Would patient like information on creating a medical advance directive? Yes (MAU/Ambulatory/Procedural Areas - Information given) - No - patient declined information    Current Medications (verified) Outpatient Encounter Medications as of 06/01/2018  Medication Sig  . albuterol (PROVENTIL HFA;VENTOLIN HFA) 108 (90 Base) MCG/ACT inhaler Inhale 2 puffs into the lungs every 6 (six) hours as needed for wheezing or shortness of breath.  Marland Kitchen albuterol (PROVENTIL) (2.5 MG/3ML) 0.083% nebulizer solution Take 3 mLs (2.5 mg total) by nebulization every 6 (six) hours as needed for wheezing or shortness of breath.  Marland Kitchen aspirin 81 MG tablet Take 81 mg by mouth daily.  . calcium-vitamin D (OSCAL WITH D) 500-200 MG-UNIT tablet Take 1 tablet by mouth.  . EPIPEN 2-PAK 0.3 MG/0.3ML SOAJ injection   . glipiZIDE (GLIPIZIDE XL) 5 MG 24 hr tablet Take 1 tablet (5 mg total) by mouth daily.  Marland Kitchen lisinopril (PRINIVIL,ZESTRIL) 2.5 MG tablet Take 1 tablet (2.5 mg total) by mouth daily.  Marland Kitchen loratadine (CLARITIN) 10 MG tablet Take 10 mg by mouth daily. otc  . metFORMIN (GLUCOPHAGE) 500 MG tablet TAKE ONE TABLET BY MOUTH TWICE DAILY CALL  AND  SCHEDULE  AN  APPOINTMENT  . Multiple Vitamin (MULTIVITAMIN) capsule Take 1 capsule by mouth daily.  . pioglitazone (ACTOS) 15 MG tablet Take 1 tablet (15 mg total) by  mouth daily.  . simvastatin (ZOCOR) 40 MG tablet TAKE ONE TABLET BY MOUTH ONCE DAILY SCHEDULE  AN  APPOINTMENT  FOR  REFILLS  . etodolac (LODINE) 400 MG tablet Take 1 tablet (400 mg total) by mouth 2 (two) times daily. (Patient not taking: Reported on 06/01/2018)  . GLIPIZIDE XL 5 MG 24 hr tablet TAKE 1 TABLET BY MOUTH ONCE DAILY   Facility-Administered Encounter Medications as of 06/01/2018  Medication  . albuterol (PROVENTIL) (2.5 MG/3ML) 0.083% nebulizer solution 2.5 mg  . ipratropium-albuterol (DUONEB) 0.5-2.5 (3) MG/3ML nebulizer solution 3 mL    Allergies (verified) Codeine   History: Past Medical History:  Diagnosis Date  . Asthma   . Cancer ([place])    skin ca  . Chronic back pain   . Diabetes mellitus without complication ([place])   . Hyperlipidemia   . Hypertension   . Obesity   . Osteoporosis    Past Surgical History:  Procedure Laterality Date  . BREAST BIOPSY Right 06/22/11   bx/clip-neg  . BREAST CYST ASPIRATION Left    neg  . COLONOSCOPY  2014   cleared for 5 yrs- [place] doc  . COLONOSCOPY WITH PROPOFOL N/A 03/25/2018   Procedure: COLONOSCOPY WITH PROPOFOL;  Surgeon: Manya Silvas, MD;  Location: [facility] ENDOSCOPY;  Service: Endoscopy;  Laterality: N/A;  . PARTIAL HYSTERECTOMY    . SPLENECTOMY     Family History  Problem Relation Age of Onset  . Breast cancer Sister 73  .  Cancer Sister   . Heart disease Sister   . Cancer Father   . Stroke Father   . Heart disease Sister    Social History   Socioeconomic History  . Marital status: Married    Spouse name: Not on file  . Number of children: 2  . Years of education: Not on file  . Highest education level: Associate degree: academic program  Occupational History  . Occupation: Retired  Scientific laboratory technician  . Financial resource strain: Not hard at all  . Food insecurity:    Worry: Never true    Inability: Never true  . Transportation needs:    Medical: No    Non-medical: No  Tobacco Use  . Smoking status: Never  Smoker  . Smokeless tobacco: Never Used  . Tobacco comment: smoking cessation materials not required  Substance and Sexual Activity  . Alcohol use: No    Alcohol/week: 0.0 oz  . Drug use: No  . Sexual activity: Yes  Lifestyle  . Physical activity:    Days per week: 0 days    Minutes per session: 0 min  . Stress: Not at all  Relationships  . Social connections:    Talks on phone: Patient refused    Gets together: Patient refused    Attends religious service: Patient refused    Active member of club or organization: Patient refused    Attends meetings of clubs or organizations: Patient refused    Relationship status: Married  Other Topics Concern  . Not on file  Social History Narrative  . Not on file    Tobacco Counseling Counseling given: No Comment: smoking cessation materials not required  Clinical Intake:  Pre-visit preparation completed: Yes  Pain : No/denies pain   BMI - recorded: 33.16 Nutritional Status: BMI > 30  Obese Nutritional Risks: None  Nutrition Risk Assessment: Has the patient had any N/V/D within the last 2 months?  No Does the patient have any non-healing wounds?  No Has the patient had any unintentional weight loss or weight gain?  No  Is the patient diabetic?  Yes If diabetic, was a CBG obtained today?  No Did the patient bring in their glucometer from home?  No Comments: Pt monitors CBG's twice per week. Denies any financial strains with the device or supplies.  Diabetic Exams: Diabetic Eye Exam: Completed 11/19/17 Diabetic Foot Exam: Completed 02/14/18  How often do you need to have someone help you when you read instructions, pamphlets, or other written materials from your doctor or pharmacy?: 1 - Never  Interpreter Needed?: No  Information entered by :: AE[place], LPN   Activities of Daily Living In your present state of health, do you have any difficulty performing the following activities: 06/01/2018  Hearing? N  Comment denies  hearing aids  Vision? N  Comment wears eyeglasses  Difficulty concentrating or making decisions? Y  Comment short term memory loss  Walking or climbing stairs? Y  Comment joint pain  Dressing or bathing? N  Doing errands, shopping? N  Preparing Food and eating ? N  Comment denies dentures  Using the Toilet? N  In the past six months, have you accidently leaked urine? N  Do you have problems with loss of bowel control? N  Managing your Medications? N  Managing your Finances? N  Housekeeping or managing your Housekeeping? N  Some recent data might be hidden     Immunizations and Health Maintenance Immunization History  Administered Date(s) Administered  . Influenza,  High Dose Seasonal PF 08/05/2017  . Influenza,inj,Quad PF,6+ Mos 08/19/2016  . Pneumococcal Conjugate-13 09/03/2015  . Pneumococcal Polysaccharide-23 09/30/2009, 08/05/2017  . Td 07/31/2004  . Tdap 08/19/2016  . Zoster 07/20/2016   There are no preventive care reminders to display for this patient.  Patient Care Team: Juline Patch, MD as PCP - General (Family Medicine) Catheryn Bacon, CNM as Midwife (Obstetrics and Gynecology) Allyne Gee, MD as Consulting Physician (Internal Medicine)  Indicate any recent Medical Services you may have received from other than [facility] providers in the past year (date may be approximate).     Assessment:   This is a routine wellness examination for [place].  Hearing/Vision screen Vision Screening Comments: [place] for annual eye exams  Dietary issues and exercise activities discussed: Current Exercise Habits: The patient does not participate in regular exercise at present, Exercise limited by: None identified  Goals    . DIET - INCREASE WATER INTAKE     Recommend to drink at least 6-8 8oz glasses of water per day.      Depression Screen PHQ 2/9 Scores 06/01/2018 02/24/2018 08/05/2017 08/05/2017 02/17/2017 01/23/2016 01/14/2016  PHQ - 2 Score 0 0 1 1 0 0 0    PHQ- 9 Score 0 - 2 - - - -    Fall Risk Fall Risk  06/01/2018 02/24/2018 08/05/2017 02/17/2017 01/23/2016  Falls in the past year? No No No No No  Number falls in past yr: - - - - -  Injury with Fall? - - - - -  Risk for fall due to : Impaired vision - - - -  Risk for fall due to: Comment wears eyeglasses - - - -  Follow up - - - - -    [place]: Is your home free of loose throw rugs in walkways, pet beds, electrical cords, etc? Yes Is there adequate lighting in your home to reduce risk of falls?  Yes Are there stairs in or around your home WITH handrails? Yes  ASSISTIVE DEVICES UTILIZED TO PREVENT FALLS: Use of a cane, walker or w/c? No Grab bars in the bathroom? No  Shower chair or a place to sit while bathing? No An elevated toilet seat or a handicapped toilet? No  Timed Get Up and Go Performed: Yes. Pt ambulated 10 feet within 12 sec. Gait stead-fast and without the use of an assistive device. No intervention required at this time. Fall risk prevention has been discussed.  Community Resource Referral:  Pt declined my offer to send [employer] Referral to Care Guide for installation of grab bars in the shower, shower chair or an elevated toilet seat.  Cognitive Function:     6CIT Screen 06/01/2018  What Year? 0 points  What month? 0 points  What time? 0 points  Count back from 20 0 points  Months in reverse 0 points  Repeat phrase 2 points  Total Score 2    Screening Tests Health Maintenance  Topic Date Due  . MAMMOGRAM  06/07/2018  . INFLUENZA VACCINE  06/30/2018  . HEMOGLOBIN A1C  08/17/2018  . OPHTHALMOLOGY EXAM  11/19/2018  . FOOT EXAM  02/15/2019  . COLONOSCOPY  03/26/2023  . TETANUS/TDAP  08/19/2026  . DEXA SCAN  Completed  . Hepatitis C Screening  Completed  . PNA vac Low Risk Adult  Completed    Qualifies for Shingles Vaccine? Yes. Zostavax completed 07/20/16. Due for Shingrix. Education has been provided  regarding the  importance of this vaccine. Pt has been advised to call her insurance company to determine her out of pocket expense. Advised she may also receive this vaccine at her local pharmacy or Health Dept. Verbalized acceptance and understanding.  Cancer Screenings: Lung: Low Dose CT Chest recommended if Age 53-80 years, 30 pack-year currently smoking OR have quit w/in 15years. Patient does not qualify. Breast: Up to date on Mammogram? Yes. Completed 06/07/17. Repeat every year. Scheduled for repeat mammography 06/08/18   Up to date of Bone Density/Dexa? Yes. Completed 06/05/16. Osteoporotic screenings no longer required Colorectal: Completed 03/25/18. Repeat every 5 years  Additional Screenings: Hepatitis C Screening: Completed 08/05/17   Plan:  I have personally reviewed and addressed the Medicare Annual Wellness questionnaire and have noted the following in the patient's chart:  A. Medical and social history B. Use of alcohol, tobacco or illicit drugs  C. Current medications and supplements D. Functional ability and status E.  Nutritional status F.  Physical activity G. Advance directives H. List of other physicians I.  Hospitalizations, surgeries, and ER visits in previous 12 months J.  [place] such as hearing and vision if needed, cognitive and depression L. Referrals and appointments  In addition, I have reviewed and discussed with patient certain preventive protocols, quality metrics, and best practice recommendations. A written personalized care plan for preventive services as well as general preventive health recommendations were provided to patient.  Signed,  Aleatha Borer, LPN Nurse Health Advisor  MD Recommendations: Zostavax completed 07/20/16. Due for Shingrix. Education has been provided regarding the importance of this vaccine. Pt has been advised to call her insurance company to determine her out of pocket expense. Advised she may also receive this vaccine at her local  pharmacy or Health Dept. Verbalized acceptance and understanding.

## 2018-06-08 ENCOUNTER — Ambulatory Visit
Admission: RE | Admit: 2018-06-08 | Discharge: 2018-06-08 | Disposition: A | Discharge status: Home / Self Care | Payer: Medicare Other | Source: Ambulatory Visit | Attending: Obstetrics and Gynecology | Admitting: Obstetrics and Gynecology

## 2018-06-08 ENCOUNTER — Ambulatory Visit (INDEPENDENT_AMBULATORY_CARE_PROVIDER_SITE_OTHER): Payer: Medicare Other

## 2018-06-08 DIAGNOSIS — J301 Allergic rhinitis due to pollen: Secondary | ICD-10-CM

## 2018-06-08 DIAGNOSIS — Z1231 Encounter for screening mammogram for malignant neoplasm of breast: Secondary | ICD-10-CM | POA: Diagnosis not present

## 2018-06-22 ENCOUNTER — Ambulatory Visit (INDEPENDENT_AMBULATORY_CARE_PROVIDER_SITE_OTHER): Payer: Medicare Other

## 2018-06-22 DIAGNOSIS — J301 Allergic rhinitis due to pollen: Secondary | ICD-10-CM

## 2018-07-06 ENCOUNTER — Ambulatory Visit (INDEPENDENT_AMBULATORY_CARE_PROVIDER_SITE_OTHER): Payer: Medicare Other

## 2018-07-06 DIAGNOSIS — J301 Allergic rhinitis due to pollen: Secondary | ICD-10-CM | POA: Diagnosis not present

## 2018-07-20 ENCOUNTER — Ambulatory Visit (INDEPENDENT_AMBULATORY_CARE_PROVIDER_SITE_OTHER): Payer: Medicare Other

## 2018-07-20 DIAGNOSIS — J301 Allergic rhinitis due to pollen: Secondary | ICD-10-CM | POA: Diagnosis not present

## 2018-08-04 ENCOUNTER — Ambulatory Visit: Payer: Medicare Other | Admitting: Internal Medicine

## 2018-08-04 ENCOUNTER — Encounter: Payer: Self-pay | Admitting: Internal Medicine

## 2018-08-04 VITALS — BP 120/72 | HR 80 | Resp 16 | Ht 63.0 in | Wt 187.0 lb

## 2018-08-04 DIAGNOSIS — J449 Chronic obstructive pulmonary disease, unspecified: Secondary | ICD-10-CM | POA: Diagnosis not present

## 2018-08-04 DIAGNOSIS — J301 Allergic rhinitis due to pollen: Secondary | ICD-10-CM | POA: Diagnosis not present

## 2018-08-04 DIAGNOSIS — R0602 Shortness of breath: Secondary | ICD-10-CM

## 2018-08-04 NOTE — Progress Notes (Signed)
[facility] [place], [place] [address]  Pulmonary Sleep Medicine   Office Visit Note  Patient Name: Lindsey George DOB: [birth year] MRN [id number]  Date of Service: 08/04/2018  Complaints/HPI: Pt here for follow up on allergy shots. She has been taking allergy injections for quite some time.  Since may she has been getting injections biweekly.  She reports she has been doing well with them, and she would like to try decreasing to once monthly.  She denies allergy symptoms.  She has not been wheezing, having sob, rhinitis or coughing. She has an albuterol rescue inhaler, and reports not having used it for a few months.     ROS  General: (-) fever, (-) chills, (-) night sweats, (-) weakness Skin: (-) rashes, (-) itching,. Eyes: (-) visual changes, (-) redness, (-) itching. Nose and Sinuses: (-) nasal stuffiness or itchiness, (-) postnasal drip, (-) nosebleeds, (-) sinus trouble. Mouth and Throat: (-) sore throat, (-) hoarseness. Neck: (-) swollen glands, (-) enlarged thyroid, (-) neck pain. Respiratory: - cough, (-) bloody sputum, - shortness of breath, - wheezing. Cardiovascular: - ankle swelling, (-) chest pain. Lymphatic: (-) lymph node enlargement. Neurologic: (-) numbness, (-) tingling. Psychiatric: (-) anxiety, (-) depression   Current Medication: Outpatient Encounter Medications as of 08/04/2018  Medication Sig Note  . albuterol (PROVENTIL HFA;VENTOLIN HFA) 108 (90 Base) MCG/ACT inhaler Inhale 2 puffs into the lungs every 6 (six) hours as needed for wheezing or shortness of breath.   Marland Kitchen albuterol (PROVENTIL) (2.5 MG/3ML) 0.083% nebulizer solution Take 3 mLs (2.5 mg total) by nebulization every 6 (six) hours as needed for wheezing or shortness of breath.   Marland Kitchen aspirin 81 MG tablet Take 81 mg by mouth daily.   . calcium-vitamin D (OSCAL WITH D) 500-200 MG-UNIT tablet Take 1 tablet by mouth.   . EPIPEN 2-PAK 0.3 MG/0.3ML SOAJ injection  03/20/2016:  Received from: External Pharmacy  . etodolac (LODINE) 400 MG tablet Take 1 tablet (400 mg total) by mouth 2 (two) times daily. (Patient not taking: Reported on 06/01/2018)   . glipiZIDE (GLIPIZIDE XL) 5 MG 24 hr tablet Take 1 tablet (5 mg total) by mouth daily.   Marland Kitchen GLIPIZIDE XL 5 MG 24 hr tablet TAKE 1 TABLET BY MOUTH ONCE DAILY   . lisinopril (PRINIVIL,ZESTRIL) 2.5 MG tablet Take 1 tablet (2.5 mg total) by mouth daily.   Marland Kitchen loratadine (CLARITIN) 10 MG tablet Take 10 mg by mouth daily. otc   . metFORMIN (GLUCOPHAGE) 500 MG tablet TAKE ONE TABLET BY MOUTH TWICE DAILY CALL  AND  SCHEDULE  AN  APPOINTMENT   . Multiple Vitamin (MULTIVITAMIN) capsule Take 1 capsule by mouth daily.   . pioglitazone (ACTOS) 15 MG tablet Take 1 tablet (15 mg total) by mouth daily.   . simvastatin (ZOCOR) 40 MG tablet TAKE ONE TABLET BY MOUTH ONCE DAILY SCHEDULE  AN  APPOINTMENT  FOR  REFILLS    Facility-Administered Encounter Medications as of 08/04/2018  Medication  . albuterol (PROVENTIL) (2.5 MG/3ML) 0.083% nebulizer solution 2.5 mg  . ipratropium-albuterol (DUONEB) 0.5-2.5 (3) MG/3ML nebulizer solution 3 mL    Surgical History: Past Surgical History:  Procedure Laterality Date  . ABDOMINAL HYSTERECTOMY    . BREAST BIOPSY Right 06/22/11   bx/clip-neg  . BREAST CYST ASPIRATION Left    neg  . COLONOSCOPY  2014   cleared for 5 yrs- [facility] doc  . COLONOSCOPY WITH PROPOFOL N/A 03/25/2018   Procedure: COLONOSCOPY WITH PROPOFOL;  Surgeon:  Manya Silvas, MD;  [facility] ENDOSCOPY;  Service: Endoscopy;  Laterality: N/A;  . PARTIAL HYSTERECTOMY    . SPLENECTOMY      Medical History: Past Medical History:  Diagnosis Date  . Asthma   . Cancer ([place])    skin ca  . Chronic back pain   . Diabetes mellitus without complication ([place])   . Hyperlipidemia   . Hypertension   . Obesity   . Osteoporosis     Family History: Family History  Problem Relation Age of Onset  . Breast cancer Sister 74  . Cancer Sister    . Heart disease Sister   . Cancer Father   . Stroke Father   . Heart disease Sister     Social History: Social History   Socioeconomic History  . Marital status: Married    Spouse name: Not on file  . Number of children: 2  . Years of education: Not on file  . Highest education level: Associate degree: academic program  Occupational History  . Occupation: Retired  Scientific laboratory technician  . Financial resource strain: Not hard at all  . Food insecurity:    Worry: Never true    Inability: Never true  . Transportation needs:    Medical: No    Non-medical: No  Tobacco Use  . Smoking status: Never Smoker  . Smokeless tobacco: Never Used  . Tobacco comment: smoking cessation materials not required  Substance and Sexual Activity  . Alcohol use: No    Alcohol/week: 0.0 standard drinks  . Drug use: No  . Sexual activity: Yes  Lifestyle  . Physical activity:    Days per week: 0 days    Minutes per session: 0 min  . Stress: Not at all  Relationships  . Social connections:    Talks on phone: Patient refused    Gets together: Patient refused    Attends religious service: Patient refused    Active member of club or organization: Patient refused    Attends meetings of clubs or organizations: Patient refused    Relationship status: Married  . Intimate partner violence:    Fear of current or ex partner: No    Emotionally abused: No    Physically abused: No    Forced sexual activity: No  Other Topics Concern  . Not on file  Social History Narrative  . Not on file    Vital Signs: Blood pressure 120/72, pulse 80, resp. rate 16, height 5\' 3"  (1.6 m), weight 187 lb (84.8 kg), SpO2 98 %.  Examination: General Appearance: The patient is well-developed, well-nourished, and in no distress. Skin: Gross inspection of skin unremarkable. Head: normocephalic, no gross deformities. Eyes: no gross deformities noted. ENT: ears appear grossly normal no exudates. Neck: Supple. No thyromegaly.  No LAD. Respiratory: Clear to auscultation bilaterally. Cardiovascular: Normal S1 and S2 without murmur or rub. Extremities: No cyanosis. pulses are equal. Neurologic: Alert and oriented. No involuntary movements.  LABS: No results found for this or any previous visit (from the past 2160 hour(s)).  Radiology: Mm 3d Screen Breast Bilateral  Result Date: 06/08/2018 CLINICAL DATA:  Screening. EXAM: DIGITAL SCREENING BILATERAL MAMMOGRAM WITH TOMO AND CAD COMPARISON:  Previous exam(s). ACR Breast Density Category c: The breast tissue is heterogeneously dense, which may obscure small masses. FINDINGS: There are no findings suspicious for malignancy. Images were processed with CAD. IMPRESSION: No mammographic evidence of malignancy. A result letter of this screening mammogram will be mailed directly to the patient. RECOMMENDATION:  Screening mammogram in one year. (Code:SM-B-01Y) BI-RADS CATEGORY  1: Negative. Electronically Signed   By: Fidela Salisbury M.D.   On: 06/08/2018 10:59    No results found.  No results found.    Assessment and Plan: Patient Active Problem List   Diagnosis Date Noted  . Allergic rhinitis 01/19/2018  . Hx of adenomatous colonic polyps 01/14/2018  . Reactive airway disease without complication 94/76/5465  . Back ache 05/22/2015  . Alimentary obesity 05/22/2015  . Type 2 diabetes mellitus with complication, without long-term current use of insulin ([place]) 04/12/2014  . Hypercholesterolemia 04/12/2014  . OP (osteoporosis) 04/12/2014   1. Asthmatic bronchitis , chronic (HCC) Pt has not needed her rescue inhaler for a few months.  Doing well, and denies SOB.   2. Seasonal allergic rhinitis due to pollen Receiving allergy shots biweekly, will decrease to monthly at patient request at this time.   3. Shortness of breath - Spirometry with Graph   General Counseling: I have discussed the findings of the evaluation and examination with Marlowe Kays.  I have also  discussed any further diagnostic evaluation thatmay be needed or ordered today. Jailine verbalizes understanding of the findings of todays visit. We also reviewed her medications today and discussed drug interactions and side effects including but not limited excessive drowsiness and altered mental states. We also discussed that there is always a risk not just to her but also people around her. she has been encouraged to call the office with any questions or concerns that should arise related to todays visit.    Time spent: 25 This patient was seen by Orson Gear AGNP-C in Collaboration with Dr. Devona Konig as a part of collaborative care agreement.   I have personally obtained a history, examined the patient, evaluated laboratory and imaging results, formulated the assessment and plan and placed orders.    Allyne Gee, MD [facility] Pulmonary and Critical Care Sleep medicine

## 2018-08-04 NOTE — Patient Instructions (Signed)
Allergies An allergy is when your body reacts to a substance in a way that is not normal. An allergic reaction can happen after you:  Eat something.  Breathe in something.  Touch something.  You can be allergic to:  Things that are only around during certain seasons, like molds and pollens.  Foods.  Drugs.  Insects.  Animal dander.  What are the signs or symptoms?  Puffiness (swelling). This may happen on the lips, face, tongue, mouth, or throat.  Sneezing.  Coughing.  Breathing loudly (wheezing).  Stuffy nose.  Tingling in the mouth.  A rash.  Itching.  Itchy, red, puffy areas of skin (hives).  Watery eyes.  Throwing up (vomiting).  Watery poop (diarrhea).  Dizziness.  Feeling faint or fainting.  Trouble breathing or swallowing.  A tight feeling in the chest.  A fast heartbeat. How is this diagnosed? Allergies can be diagnosed with:  A medical and family history.  Skin tests.  Blood tests.  A food diary. A food diary is a record of all the foods, drinks, and symptoms you have each day.  The results of an elimination diet. This diet involves making sure not to eat certain foods and then seeing what happens when you start eating them again.  How is this treated? There is no cure for allergies, but allergic reactions can be treated with medicine. Severe reactions usually need to be treated at a hospital. How is this prevented? The best way to prevent an allergic reaction is to avoid the thing you are allergic to. Allergy shots and medicines can also help prevent reactions in some cases. This information is not intended to replace advice given to you by your health care provider. Make sure you discuss any questions you have with your health care provider. Document Released: 03/13/2013 Document Revised: 07/13/2016 Document Reviewed: 08/28/2014 Elsevier Interactive Patient Education  2018 Elsevier Inc.  

## 2018-08-11 ENCOUNTER — Ambulatory Visit
Admission: RE | Admit: 2018-08-11 | Discharge: 2018-08-11 | Disposition: A | Discharge status: Home / Self Care | Payer: Medicare Other | Source: Ambulatory Visit | Attending: Family Medicine | Admitting: Family Medicine

## 2018-08-11 ENCOUNTER — Ambulatory Visit: Payer: Medicare Other | Admitting: Family Medicine

## 2018-08-11 ENCOUNTER — Encounter: Payer: Self-pay | Admitting: Family Medicine

## 2018-08-11 ENCOUNTER — Other Ambulatory Visit
Admission: RE | Admit: 2018-08-11 | Discharge: 2018-08-11 | Disposition: A | Discharge status: Home / Self Care | Payer: Medicare Other | Source: Ambulatory Visit | Attending: Family Medicine | Admitting: Family Medicine

## 2018-08-11 VITALS — BP 120/78 | HR 100 | Temp 99.1°F | Ht 63.0 in | Wt 185.0 lb

## 2018-08-11 DIAGNOSIS — J189 Pneumonia, unspecified organism: Secondary | ICD-10-CM

## 2018-08-11 DIAGNOSIS — R509 Fever, unspecified: Secondary | ICD-10-CM | POA: Diagnosis not present

## 2018-08-11 DIAGNOSIS — E118 Type 2 diabetes mellitus with unspecified complications: Secondary | ICD-10-CM | POA: Diagnosis not present

## 2018-08-11 DIAGNOSIS — J4521 Mild intermittent asthma with (acute) exacerbation: Secondary | ICD-10-CM

## 2018-08-11 DIAGNOSIS — Z8709 Personal history of other diseases of the respiratory system: Secondary | ICD-10-CM | POA: Insufficient documentation

## 2018-08-11 DIAGNOSIS — R05 Cough: Secondary | ICD-10-CM | POA: Diagnosis present

## 2018-08-11 LAB — CBC WITH DIFFERENTIAL/PLATELET
Basophils Absolute: 0.1 10*3/uL (ref 0–0.1)
Basophils Relative: 1 %
EOS ABS: 0.2 10*3/uL (ref 0–0.7)
Eosinophils Relative: 1 %
HCT: 40.8 % (ref 35.0–47.0)
HEMOGLOBIN: 12.9 g/dL (ref 12.0–16.0)
LYMPHS ABS: 3.6 10*3/uL (ref 1.0–3.6)
LYMPHS PCT: 29 %
MCH: 25.5 pg — AB (ref 26.0–34.0)
MCHC: 31.7 g/dL — AB (ref 32.0–36.0)
MCV: 80.5 fL (ref 80.0–100.0)
MONOS PCT: 9 %
Monocytes Absolute: 1.1 10*3/uL — ABNORMAL HIGH (ref 0.2–0.9)
NEUTROS PCT: 60 %
Neutro Abs: 7.2 10*3/uL — ABNORMAL HIGH (ref 1.4–6.5)
Platelets: 394 10*3/uL (ref 150–440)
RBC: 5.07 MIL/uL (ref 3.80–5.20)
RDW: 13.5 % (ref 11.5–14.5)
WBC: 12.1 10*3/uL — ABNORMAL HIGH (ref 3.6–11.0)

## 2018-08-11 MED ORDER — LEVOFLOXACIN 500 MG PO TABS: 500.0000 mg | ORAL_TABLET | Freq: Every day | ORAL | 0 refills | Status: DC

## 2018-08-11 MED ORDER — IPRATROPIUM-ALBUTEROL 0.5-2.5 (3) MG/3ML IN SOLN
3.0000 mL | Freq: Once | RESPIRATORY_TRACT | Status: AC
  Administered 2018-08-11: 3 mL via RESPIRATORY_TRACT

## 2018-08-11 NOTE — Progress Notes (Signed)
Name: Lindsey George   MRN: [id number]    DOB: 1951/03/29   Date:08/11/2018       Progress Note  Subjective  Chief Complaint  Chief Complaint  Patient presents with  . Cough    clear production    Cough  This is a new problem. The current episode started yesterday. The problem has been gradually worsening. The cough is productive of purulent sputum. Associated symptoms include a fever, nasal congestion, postnasal drip, a sore throat and shortness of breath. Pertinent negatives include no chest pain, chills, ear congestion, ear pain, headaches, heartburn, hemoptysis, myalgias, rash, rhinorrhea, sweats, weight loss or wheezing. She has tried a beta-agonist inhaler for the symptoms. Her past medical history is significant for asthma. There is no history of environmental allergies. COPD: duoneb.  Asthma  She complains of chest tightness, cough, shortness of breath and sputum production. There is no difficulty breathing, frequent throat clearing, hemoptysis, hoarse voice or wheezing. This is a recurrent problem. The current episode started yesterday. The problem occurs daily. The cough is productive and productive of purulent sputum. Associated symptoms include a fever, nasal congestion, postnasal drip and a sore throat. Pertinent negatives include no chest pain, ear congestion, ear pain, headaches, heartburn, malaise/fatigue, myalgias, rhinorrhea, sweats or weight loss. Her symptoms are aggravated by change in weather. Her symptoms are alleviated by beta-agonist. She reports moderate improvement on treatment. Her past medical history is significant for asthma. COPD: duoneb.    No problem-specific Assessment & Plan notes found for this encounter.   Past Medical History:  Diagnosis Date  . Asthma   . Cancer ([place])    skin ca  . Chronic back pain   . Diabetes mellitus without complication ([place])   . Hyperlipidemia   . Hypertension   . Obesity   . Osteoporosis     Past Surgical History:   Procedure Laterality Date  . ABDOMINAL HYSTERECTOMY    . BREAST BIOPSY Right 06/22/11   bx/clip-neg  . BREAST CYST ASPIRATION Left    neg  . COLONOSCOPY  2014   cleared for 5 yrs- [place] doc  . COLONOSCOPY WITH PROPOFOL N/A 03/25/2018   Procedure: COLONOSCOPY WITH PROPOFOL;  Surgeon: Manya Silvas, MD;  Location[facility] ENDOSCOPY;  Service: Endoscopy;  Laterality: N/A;  . PARTIAL HYSTERECTOMY    . SPLENECTOMY      Family History  Problem Relation Age of Onset  . Breast cancer Sister 41  . Cancer Sister   . Heart disease Sister   . Cancer Father   . Stroke Father   . Heart disease Sister     Social History   Socioeconomic History  . Marital status: Married    Spouse name: Not on file  . Number of children: 2  . Years of education: Not on file  . Highest education level: Associate degree: academic program  Occupational History  . Occupation: Retired  Scientific laboratory technician  . Financial resource strain: Not hard at all  . Food insecurity:    Worry: Never true    Inability: Never true  . Transportation needs:    Medical: No    Non-medical: No  Tobacco Use  . Smoking status: Never Smoker  . Smokeless tobacco: Never Used  . Tobacco comment: smoking cessation materials not required  Substance and Sexual Activity  . Alcohol use: No    Alcohol/week: 0.0 standard drinks  . Drug use: No  . Sexual activity: Yes  Lifestyle  . Physical activity:  Days per week: 0 days    Minutes per session: 0 min  . Stress: Not at all  Relationships  . Social connections:    Talks on phone: Patient refused    Gets together: Patient refused    Attends religious service: Patient refused    Active member of club or organization: Patient refused    Attends meetings of clubs or organizations: Patient refused    Relationship status: Married  . Intimate partner violence:    Fear of current or ex partner: No    Emotionally abused: No    Physically abused: No    Forced sexual activity: No  Other  Topics Concern  . Not on file  Social History Narrative  . Not on file    Allergies  Allergen Reactions  . Codeine Other (See Comments)    Outpatient Medications Prior to Visit  Medication Sig Dispense Refill  . albuterol (PROVENTIL HFA;VENTOLIN HFA) 108 (90 Base) MCG/ACT inhaler Inhale 2 puffs into the lungs every 6 (six) hours as needed for wheezing or shortness of breath. 1 Inhaler 2  . albuterol (PROVENTIL) (2.5 MG/3ML) 0.083% nebulizer solution Take 3 mLs (2.5 mg total) by nebulization every 6 (six) hours as needed for wheezing or shortness of breath. 75 mL 12  . aspirin 81 MG tablet Take 81 mg by mouth daily.    . calcium-vitamin D (OSCAL WITH D) 500-200 MG-UNIT tablet Take 1 tablet by mouth.    . EPIPEN 2-PAK 0.3 MG/0.3ML SOAJ injection     . glipiZIDE (GLIPIZIDE XL) 5 MG 24 hr tablet Take 1 tablet (5 mg total) by mouth daily. 90 tablet 1  . lisinopril (PRINIVIL,ZESTRIL) 2.5 MG tablet Take 1 tablet (2.5 mg total) by mouth daily. 90 tablet 1  . loratadine (CLARITIN) 10 MG tablet Take 10 mg by mouth daily. otc    . metFORMIN (GLUCOPHAGE) 500 MG tablet TAKE ONE TABLET BY MOUTH TWICE DAILY CALL  AND  SCHEDULE  AN  APPOINTMENT 180 tablet 1  . Multiple Vitamin (MULTIVITAMIN) capsule Take 1 capsule by mouth daily.    . pioglitazone (ACTOS) 15 MG tablet Take 1 tablet (15 mg total) by mouth daily. 90 tablet 1  . simvastatin (ZOCOR) 40 MG tablet TAKE ONE TABLET BY MOUTH ONCE DAILY SCHEDULE  AN  APPOINTMENT  FOR  REFILLS 90 tablet 1  . etodolac (LODINE) 400 MG tablet Take 1 tablet (400 mg total) by mouth 2 (two) times daily. 60 tablet 0  . GLIPIZIDE XL 5 MG 24 hr tablet TAKE 1 TABLET BY MOUTH ONCE DAILY 90 tablet 0   Facility-Administered Medications Prior to Visit  Medication Dose Route Frequency Provider Last Rate Last Dose  . albuterol (PROVENTIL) (2.5 MG/3ML) 0.083% nebulizer solution 2.5 mg  2.5 mg Nebulization Once Jones, Deanna C, MD      . ipratropium-albuterol (DUONEB) 0.5-2.5 (3)  MG/3ML nebulizer solution 3 mL  3 mL Nebulization Once Juline Patch, MD        Review of Systems  Constitutional: Positive for fever. Negative for chills, malaise/fatigue and weight loss.  HENT: Positive for postnasal drip and sore throat. Negative for ear discharge, ear pain, hoarse voice and rhinorrhea.   Eyes: Negative for blurred vision.  Respiratory: Positive for cough, sputum production and shortness of breath. Negative for hemoptysis and wheezing.   Cardiovascular: Negative for chest pain, palpitations and leg swelling.  Gastrointestinal: Negative for abdominal pain, blood in stool, constipation, diarrhea, heartburn, melena and nausea.  Genitourinary: Negative for  dysuria, frequency, hematuria and urgency.  Musculoskeletal: Negative for back pain, joint pain, myalgias and neck pain.  Skin: Negative for rash.  Neurological: Negative for dizziness, tingling, sensory change, focal weakness and headaches.  Endo/Heme/Allergies: Negative for environmental allergies and polydipsia. Does not bruise/bleed easily.  Psychiatric/Behavioral: Negative for depression and suicidal ideas. The patient is not nervous/anxious and does not have insomnia.      Objective  Vitals:   08/11/18 0957  BP: 120/78  Pulse: 100  Temp: 99.1 F (37.3 C)  SpO2: 96%  Weight: 185 lb (83.9 kg)  Height: 5\' 3"  (1.6 m)    Physical Exam  Constitutional: No distress.  HENT:  Head: Normocephalic and atraumatic.  Right Ear: External ear normal.  Left Ear: External ear normal.  Nose: Nose normal.  Mouth/Throat: Oropharynx is clear and moist.  Eyes: Pupils are equal, round, and reactive to light. Conjunctivae and EOM are normal. Right eye exhibits no discharge. Left eye exhibits no discharge.  Neck: Normal range of motion. Neck supple. No JVD present. No thyromegaly present.  Cardiovascular: Normal rate, regular rhythm, normal heart sounds and intact distal pulses. Exam reveals no gallop and no friction rub.   No murmur heard. Pulmonary/Chest: Effort normal. She has decreased breath sounds. She has wheezes. She has no rhonchi. She has no rales.  Decreased bs RML  Abdominal: Soft. Bowel sounds are normal. She exhibits no mass. There is no tenderness. There is no guarding.  Musculoskeletal: Normal range of motion. She exhibits no edema.  Lymphadenopathy:    She has no cervical adenopathy.  Neurological: She is alert. She has normal reflexes.  Skin: Skin is warm and dry. She is not diaphoretic.      Assessment & Plan  Problem List Items Addressed This Visit      Endocrine   Type 2 diabetes mellitus with complication, without long-term current use of insulin ([place])    Other Visit Diagnoses    Pneumonia due to infectious organism, unspecified laterality, unspecified part of lung    -  Primary   cbc and start levaquin/ chest xray   Relevant Medications   ipratropium-albuterol (DUONEB) 0.5-2.5 (3) MG/3ML nebulizer solution 3 mL (Completed)   levofloxacin (LEVAQUIN) 500 MG tablet   Other Relevant Orders   DG Chest 2 View   Mild intermittent asthma with acute exacerbation       gave inhalation treatment/ duoneb   Relevant Medications   ipratropium-albuterol (DUONEB) 0.5-2.5 (3) MG/3ML nebulizer solution 3 mL (Completed)   Other Relevant Orders   DG Chest 2 View      Meds ordered this encounter  Medications  . ipratropium-albuterol (DUONEB) 0.5-2.5 (3) MG/3ML nebulizer solution 3 mL  . levofloxacin (LEVAQUIN) 500 MG tablet    Sig: Take 1 tablet (500 mg total) by mouth daily.    Dispense:  7 tablet    Refill:  0   Nebulization treatment with duaneb given and there was significant improvement.Pt will continue nubulization at home with albuterol.   Dr. Macon [facility]  08/11/18

## 2018-08-19 ENCOUNTER — Other Ambulatory Visit: Payer: Self-pay | Admitting: Family Medicine

## 2018-08-19 DIAGNOSIS — E119 Type 2 diabetes mellitus without complications: Secondary | ICD-10-CM

## 2018-08-31 ENCOUNTER — Ambulatory Visit (INDEPENDENT_AMBULATORY_CARE_PROVIDER_SITE_OTHER): Payer: Medicare Other

## 2018-08-31 DIAGNOSIS — J301 Allergic rhinitis due to pollen: Secondary | ICD-10-CM

## 2018-09-01 ENCOUNTER — Ambulatory Visit: Payer: Self-pay | Admitting: Internal Medicine

## 2018-09-07 DIAGNOSIS — J301 Allergic rhinitis due to pollen: Secondary | ICD-10-CM | POA: Diagnosis not present

## 2018-09-19 ENCOUNTER — Ambulatory Visit: Payer: Medicare Other | Admitting: Family Medicine

## 2018-09-19 ENCOUNTER — Encounter: Payer: Self-pay | Admitting: Family Medicine

## 2018-09-19 VITALS — BP 124/64 | HR 80 | Ht 63.0 in | Wt 186.0 lb

## 2018-09-19 DIAGNOSIS — Z23 Encounter for immunization: Secondary | ICD-10-CM | POA: Diagnosis not present

## 2018-09-19 DIAGNOSIS — E119 Type 2 diabetes mellitus without complications: Secondary | ICD-10-CM

## 2018-09-19 DIAGNOSIS — I10 Essential (primary) hypertension: Secondary | ICD-10-CM | POA: Diagnosis not present

## 2018-09-19 DIAGNOSIS — R69 Illness, unspecified: Secondary | ICD-10-CM

## 2018-09-19 DIAGNOSIS — E7800 Pure hypercholesterolemia, unspecified: Secondary | ICD-10-CM

## 2018-09-19 MED ORDER — LISINOPRIL 2.5 MG PO TABS: 2.5000 mg | ORAL_TABLET | Freq: Every day | ORAL | 1 refills | Status: DC

## 2018-09-19 MED ORDER — GLIPIZIDE ER 5 MG PO TB24: 5.0000 mg | ORAL_TABLET | Freq: Every day | ORAL | 1 refills | Status: DC

## 2018-09-19 MED ORDER — METFORMIN HCL 500 MG PO TABS: ORAL_TABLET | ORAL | 1 refills | Status: DC

## 2018-09-19 MED ORDER — SIMVASTATIN 40 MG PO TABS: ORAL_TABLET | ORAL | 1 refills | Status: DC

## 2018-09-19 MED ORDER — PIOGLITAZONE HCL 15 MG PO TABS: 15.0000 mg | ORAL_TABLET | Freq: Every day | ORAL | 1 refills | Status: DC

## 2018-09-19 NOTE — Progress Notes (Signed)
Date:  09/19/2018   Name:  Lindsey George   DOB:  14-Oct-1951   MRN:  [id number]   Chief Complaint: Diabetes; Hypertension; Hyperlipidemia; and Flu Vaccine Diabetes  She presents for her follow-up diabetic visit. She has type 2 diabetes mellitus. Her disease course has been stable. Pertinent negatives for hypoglycemia include no confusion, dizziness, headaches, hunger, mood changes, nervousness/anxiousness, pallor, seizures, sleepiness, speech difficulty, sweats or tremors. There are no diabetic associated symptoms. Pertinent negatives for diabetes include no blurred vision, no chest pain, no fatigue, no polydipsia, no polyphagia, no polyuria and no weakness. Symptoms are stable. There are no diabetic complications. Pertinent negatives for diabetic complications include no CVA, PVD or retinopathy. There are no known risk factors for coronary artery disease. Current diabetic treatment includes oral agent (triple therapy). She is compliant with treatment all of the time. Her weight is stable. She is following a generally healthy diet. Meal planning includes avoidance of concentrated sweets and carbohydrate counting. She participates in exercise daily. There is no change in her home blood glucose trend. Her breakfast blood glucose is taken between 8-9 am. Her breakfast blood glucose range is generally 130-140 mg/dl. An ACE inhibitor/angiotensin II receptor blocker is being taken. She does not see a podiatrist.Eye exam is current.  Hypertension  This is a new problem. The current episode started more than 1 year ago. The problem has been gradually improving since onset. The problem is controlled. Pertinent negatives include no anxiety, blurred vision, chest pain, headaches, malaise/fatigue, neck pain, orthopnea, palpitations, peripheral edema, PND, shortness of breath or sweats. There are no associated agents to hypertension. There are no known risk factors for coronary artery disease. Past treatments  include ACE inhibitors. The current treatment provides moderate improvement. There are no compliance problems.  There is no history of angina, kidney disease, CAD/MI, CVA, heart failure, left ventricular hypertrophy, PVD or retinopathy. There is no history of chronic renal disease, a hypertension causing med or renovascular disease.  Hyperlipidemia  This is a chronic problem. The problem is controlled. Recent lipid tests were reviewed and are variable. She has no history of chronic renal disease. Pertinent negatives include no chest pain, myalgias or shortness of breath. The current treatment provides moderate improvement of lipids. There are no compliance problems.      Review of Systems  Constitutional: Negative.  Negative for chills, fatigue, fever, malaise/fatigue and unexpected weight change.  HENT: Negative for congestion, ear discharge, ear pain, rhinorrhea, sinus pressure, sneezing and sore throat.   Eyes: Negative for blurred vision, photophobia, pain, discharge, redness and itching.  Respiratory: Negative for cough, shortness of breath, wheezing and stridor.   Cardiovascular: Negative for chest pain, palpitations, orthopnea and PND.  Gastrointestinal: Negative for abdominal pain, blood in stool, constipation, diarrhea, nausea and vomiting.  Endocrine: Negative for cold intolerance, heat intolerance, polydipsia, polyphagia and polyuria.  Genitourinary: Negative for dysuria, flank pain, frequency, hematuria, menstrual problem, pelvic pain, urgency, vaginal bleeding and vaginal discharge.  Musculoskeletal: Negative for arthralgias, back pain, myalgias and neck pain.  Skin: Negative for pallor and rash.  Allergic/Immunologic: Negative for environmental allergies and food allergies.  Neurological: Negative for dizziness, tremors, seizures, speech difficulty, weakness, light-headedness, numbness and headaches.  Hematological: Negative for adenopathy. Does not bruise/bleed easily.    Psychiatric/Behavioral: Negative for confusion and dysphoric mood. The patient is not nervous/anxious.     Patient Active Problem List   Diagnosis Date Noted  . Allergic rhinitis 01/19/2018  . Hx of adenomatous colonic polyps  01/14/2018  . Reactive airway disease without complication 57/84/6962  . Back ache 05/22/2015  . Alimentary obesity 05/22/2015  . Type 2 diabetes mellitus with complication, without long-term current use of insulin ([place]) 04/12/2014  . Hypercholesterolemia 04/12/2014  . OP (osteoporosis) 04/12/2014    Allergies  Allergen Reactions  . Codeine Other (See Comments)    Past Surgical History:  Procedure Laterality Date  . ABDOMINAL HYSTERECTOMY    . BREAST BIOPSY Right 06/22/11   bx/clip-neg  . BREAST CYST ASPIRATION Left    neg  . COLONOSCOPY  2014   cleared for 5 yrs- [place] doc  . COLONOSCOPY WITH PROPOFOL N/A 03/25/2018   Procedure: COLONOSCOPY WITH PROPOFOL;  Surgeon: Manya Silvas, MD;  Location: [facility] ENDOSCOPY;  Service: Endoscopy;  Laterality: N/A;  . PARTIAL HYSTERECTOMY    . SPLENECTOMY      Social History   Tobacco Use  . Smoking status: Never Smoker  . Smokeless tobacco: Never Used  . Tobacco comment: smoking cessation materials not required  Substance Use Topics  . Alcohol use: No    Alcohol/week: 0.0 standard drinks  . Drug use: No     Medication list has been reviewed and updated.  Current Meds  Medication Sig  . albuterol (PROVENTIL HFA;VENTOLIN HFA) 108 (90 Base) MCG/ACT inhaler Inhale 2 puffs into the lungs every 6 (six) hours as needed for wheezing or shortness of breath.  Marland Kitchen albuterol (PROVENTIL) (2.5 MG/3ML) 0.083% nebulizer solution Take 3 mLs (2.5 mg total) by nebulization every 6 (six) hours as needed for wheezing or shortness of breath.  Marland Kitchen aspirin 81 MG tablet Take 81 mg by mouth daily.  . calcium-vitamin D (OSCAL WITH D) 500-200 MG-UNIT tablet Take 1 tablet by mouth.  . EPIPEN 2-PAK 0.3 MG/0.3ML SOAJ injection   .  glipiZIDE (GLIPIZIDE XL) 5 MG 24 hr tablet Take 1 tablet (5 mg total) by mouth daily.  Marland Kitchen lisinopril (PRINIVIL,ZESTRIL) 2.5 MG tablet Take 1 tablet (2.5 mg total) by mouth daily.  Marland Kitchen loratadine (CLARITIN) 10 MG tablet Take 10 mg by mouth daily. otc  . metFORMIN (GLUCOPHAGE) 500 MG tablet TAKE ONE TABLET BY MOUTH TWICE DAILY  . Multiple Vitamin (MULTIVITAMIN) capsule Take 1 capsule by mouth daily.  . pioglitazone (ACTOS) 15 MG tablet Take 1 tablet (15 mg total) by mouth daily.  . simvastatin (ZOCOR) 40 MG tablet TAKE ONE TABLET BY MOUTH ONCE DAILY  . [DISCONTINUED] glipiZIDE (GLIPIZIDE XL) 5 MG 24 hr tablet Take 1 tablet (5 mg total) by mouth daily.  . [DISCONTINUED] lisinopril (PRINIVIL,ZESTRIL) 2.5 MG tablet TAKE 1 TABLET BY MOUTH ONCE DAILY  . [DISCONTINUED] metFORMIN (GLUCOPHAGE) 500 MG tablet TAKE ONE TABLET BY MOUTH TWICE DAILY CALL  AND  SCHEDULE  AN  APPOINTMENT  . [DISCONTINUED] pioglitazone (ACTOS) 15 MG tablet Take 1 tablet (15 mg total) by mouth daily.  . [DISCONTINUED] simvastatin (ZOCOR) 40 MG tablet TAKE ONE TABLET BY MOUTH ONCE DAILY SCHEDULE  AN  APPOINTMENT  FOR  REFILLS   Current Facility-Administered Medications for the 09/19/18 encounter (Office Visit) with Juline Patch, MD  Medication  . albuterol (PROVENTIL) (2.5 MG/3ML) 0.083% nebulizer solution 2.5 mg  . ipratropium-albuterol (DUONEB) 0.5-2.5 (3) MG/3ML nebulizer solution 3 mL    PHQ 2/9 Scores 06/01/2018 02/24/2018 08/05/2017 08/05/2017  PHQ - 2 Score 0 0 1 1  PHQ- 9 Score 0 - 2 -    Physical Exam  Constitutional: She is oriented to person, place, and time. She appears well-developed and well-nourished.  HENT:  Head: Normocephalic.  Right Ear: External ear normal.  Left Ear: External ear normal.  Mouth/Throat: Oropharynx is clear and moist.  Eyes: Pupils are equal, round, and reactive to light. Conjunctivae and EOM are normal. Lids are everted and swept, no foreign bodies found. Left eye exhibits no hordeolum. No  foreign body present in the left eye. Right conjunctiva is not injected. Left conjunctiva is not injected. No scleral icterus.  Neck: Normal range of motion. Neck supple. No JVD present. No tracheal deviation present. No thyromegaly present.  Cardiovascular: Normal rate, regular rhythm, normal heart sounds and intact distal pulses. Exam reveals no gallop and no friction rub.  No murmur heard. Pulmonary/Chest: Effort normal and breath sounds normal. No respiratory distress. She has no wheezes. She has no rales.  Abdominal: Soft. Bowel sounds are normal. She exhibits no mass. There is no hepatosplenomegaly. There is no tenderness. There is no rebound and no guarding.  Musculoskeletal: Normal range of motion. She exhibits no edema or tenderness.  Lymphadenopathy:    She has no cervical adenopathy.  Neurological: She is alert and oriented to person, place, and time. She has normal strength. She displays normal reflexes. No cranial nerve deficit.  Skin: Skin is warm. No rash noted.  Psychiatric: She has a normal mood and affect. Her mood appears not anxious. She does not exhibit a depressed mood.  Nursing note and vitals reviewed.   BP 124/64   Pulse 80   Ht 5\' 3"  (1.6 m)   Wt 186 lb (84.4 kg)   BMI 32.95 kg/m   Assessment and Plan:  1. Type 2 diabetes mellitus without complication, without long-term current use of insulin ([facility]) Stable on meds- refill glipizide and metformin- draw A1C - glipiZIDE (GLIPIZIDE XL) 5 MG 24 hr tablet; Take 1 tablet (5 mg total) by mouth daily.  Dispense: 90 tablet; Refill: 1 - lisinopril (PRINIVIL,ZESTRIL) 2.5 MG tablet; Take 1 tablet (2.5 mg total) by mouth daily.  Dispense: 90 tablet; Refill: 1 - metFORMIN (GLUCOPHAGE) 500 MG tablet; TAKE ONE TABLET BY MOUTH TWICE DAILY  Dispense: 180 tablet; Refill: 1 - pioglitazone (ACTOS) 15 MG tablet; Take 1 tablet (15 mg total) by mouth daily.  Dispense: 90 tablet; Refill: 1 - HgB A1c  2. Hypercholesterolemia Stable on  med- refill Simvastatin/ draw lipid and hepatic - simvastatin (ZOCOR) 40 MG tablet; TAKE ONE TABLET BY MOUTH ONCE DAILY  Dispense: 90 tablet; Refill: 1  3. Essential hypertension Stable on med/ draw renal panel - Renal Function Panel  4. Taking medication for chronic disease Draw hepatic due to lipid med taken - Hepatic function panel    Dr. Otilio Miu [facility]  09/19/2018

## 2018-09-20 LAB — LIPID PANEL
CHOLESTEROL TOTAL: 145 mg/dL (ref 100–199)
Chol/HDL Ratio: 2.9 ratio (ref 0.0–4.4)
HDL: 50 mg/dL (ref >39)
LDL Calculated: 73 mg/dL (ref 0–99)
TRIGLYCERIDES: 111 mg/dL (ref 0–149)
VLDL Cholesterol Cal: 22 mg/dL (ref 5–40)

## 2018-09-20 LAB — HEPATIC FUNCTION PANEL
ALT: 15 IU/L (ref 0–32)
AST: 14 IU/L (ref 0–40)
Alkaline Phosphatase: 59 IU/L (ref 39–117)
Bilirubin Total: 0.3 mg/dL (ref 0.0–1.2)
Bilirubin, Direct: 0.09 mg/dL (ref 0.00–0.40)
Total Protein: 7.2 g/dL (ref 6.0–8.5)

## 2018-09-20 LAB — RENAL FUNCTION PANEL
Albumin: 4.7 g/dL (ref 3.6–4.8)
BUN / CREAT RATIO: 31 — AB (ref 12–28)
BUN: 17 mg/dL (ref 8–27)
CO2: 26 mmol/L (ref 20–29)
Calcium: 10.1 mg/dL (ref 8.7–10.3)
Chloride: 98 mmol/L (ref 96–106)
Creatinine, Ser: 0.55 mg/dL — ABNORMAL LOW (ref 0.57–1.00)
GFR calc non Af Amer: 97 mL/min/{1.73_m2} (ref >59)
GFR, EST AFRICAN AMERICAN: 111 mL/min/{1.73_m2} (ref >59)
Glucose: 115 mg/dL — ABNORMAL HIGH (ref 65–99)
Phosphorus: 4.2 mg/dL (ref 2.5–4.5)
Potassium: 4.4 mmol/L (ref 3.5–5.2)
SODIUM: 140 mmol/L (ref 134–144)

## 2018-09-20 LAB — HEMOGLOBIN A1C
ESTIMATED AVERAGE GLUCOSE: 140 mg/dL
HEMOGLOBIN A1C: 6.5 % — AB (ref 4.8–5.6)

## 2018-10-04 ENCOUNTER — Ambulatory Visit (INDEPENDENT_AMBULATORY_CARE_PROVIDER_SITE_OTHER): Payer: Medicare Other

## 2018-10-04 DIAGNOSIS — J301 Allergic rhinitis due to pollen: Secondary | ICD-10-CM | POA: Diagnosis not present

## 2018-11-07 ENCOUNTER — Encounter: Payer: Self-pay | Admitting: Internal Medicine

## 2018-11-07 ENCOUNTER — Ambulatory Visit: Payer: Medicare Other | Admitting: Internal Medicine

## 2018-11-07 VITALS — BP 120/70 | HR 70 | Resp 16 | Ht 63.0 in | Wt 187.0 lb

## 2018-11-07 DIAGNOSIS — R0602 Shortness of breath: Secondary | ICD-10-CM

## 2018-11-07 DIAGNOSIS — J301 Allergic rhinitis due to pollen: Secondary | ICD-10-CM

## 2018-11-07 DIAGNOSIS — J4489 Other specified chronic obstructive pulmonary disease: Secondary | ICD-10-CM

## 2018-11-07 DIAGNOSIS — J449 Chronic obstructive pulmonary disease, unspecified: Secondary | ICD-10-CM

## 2018-11-07 NOTE — Progress Notes (Signed)
[facility] [place], [place] [address]  Pulmonary Sleep Medicine   Office Visit Note  Patient Name: Lindsey George DOB: 10/22/1951 MRN [id number]  Date of Service: 11/07/2018  Complaints/HPI: follow up for allergy. She wanted to know when to come off the shots patient has been doing relatively well.  She has been tolerating the allergy shots without any discomfort.  She is on maintenance right now.  I reviewed her total duration and she should be able to continue for another 6 to 12 months and then we can try to wean her off the shots at this time.  Denies any admissions to the hospital no cough no congestion no sinusitis.  Denies having any chest pain at this time.  ROS  General: (-) fever, (-) chills, (-) night sweats, (-) weakness Skin: (-) rashes, (-) itching,. Eyes: (-) visual changes, (-) redness, (-) itching. Nose and Sinuses: (-) nasal stuffiness or itchiness, (-) postnasal drip, (-) nosebleeds, (-) sinus trouble. Mouth and Throat: (-) sore throat, (-) hoarseness. Neck: (-) swollen glands, (-) enlarged thyroid, (-) neck pain. Respiratory: + cough, (-) bloody sputum, - shortness of breath, - wheezing. Cardiovascular: - ankle swelling, (-) chest pain. Lymphatic: (-) lymph node enlargement. Neurologic: (-) numbness, (-) tingling. Psychiatric: (-) anxiety, (-) depression   Current Medication: Outpatient Encounter Medications as of 11/07/2018  Medication Sig Note  . albuterol (PROVENTIL HFA;VENTOLIN HFA) 108 (90 Base) MCG/ACT inhaler Inhale 2 puffs into the lungs every 6 (six) hours as needed for wheezing or shortness of breath.   Marland Kitchen albuterol (PROVENTIL) (2.5 MG/3ML) 0.083% nebulizer solution Take 3 mLs (2.5 mg total) by nebulization every 6 (six) hours as needed for wheezing or shortness of breath.   Marland Kitchen aspirin 81 MG tablet Take 81 mg by mouth daily.   . calcium-vitamin D (OSCAL WITH D) 500-200 MG-UNIT tablet Take 1 tablet by mouth.   . EPIPEN  2-PAK 0.3 MG/0.3ML SOAJ injection  03/20/2016: Received from: External Pharmacy  . glipiZIDE (GLIPIZIDE XL) 5 MG 24 hr tablet Take 1 tablet (5 mg total) by mouth daily.   Marland Kitchen lisinopril (PRINIVIL,ZESTRIL) 2.5 MG tablet Take 1 tablet (2.5 mg total) by mouth daily.   Marland Kitchen loratadine (CLARITIN) 10 MG tablet Take 10 mg by mouth daily. otc   . metFORMIN (GLUCOPHAGE) 500 MG tablet TAKE ONE TABLET BY MOUTH TWICE DAILY   . Multiple Vitamin (MULTIVITAMIN) capsule Take 1 capsule by mouth daily.   . pioglitazone (ACTOS) 15 MG tablet Take 1 tablet (15 mg total) by mouth daily.   . simvastatin (ZOCOR) 40 MG tablet TAKE ONE TABLET BY MOUTH ONCE DAILY    Facility-Administered Encounter Medications as of 11/07/2018  Medication  . albuterol (PROVENTIL) (2.5 MG/3ML) 0.083% nebulizer solution 2.5 mg  . ipratropium-albuterol (DUONEB) 0.5-2.5 (3) MG/3ML nebulizer solution 3 mL    Surgical History: Past Surgical History:  Procedure Laterality Date  . ABDOMINAL HYSTERECTOMY    . BREAST BIOPSY Right 06/22/11   bx/clip-neg  . BREAST CYST ASPIRATION Left    neg  . COLONOSCOPY  2014   cleared for 5 yrs- [place] doc  . COLONOSCOPY WITH PROPOFOL N/A 03/25/2018   Procedure: COLONOSCOPY WITH PROPOFOL;  Surgeon: Manya Silvas, MD;  Location: [facility] ENDOSCOPY;  Service: Endoscopy;  Laterality: N/A;  . PARTIAL HYSTERECTOMY    . SPLENECTOMY      Medical History: Past Medical History:  Diagnosis Date  . Asthma   . Cancer ([place])    skin ca  .  Chronic back pain   . Diabetes mellitus without complication ([place])   . Hyperlipidemia   . Hypertension   . Obesity   . Osteoporosis     Family History: Family History  Problem Relation Age of Onset  . Breast cancer Sister 61  . Cancer Sister   . Heart disease Sister   . Cancer Father   . Stroke Father   . Heart disease Sister     Social History: Social History   Socioeconomic History  . Marital status: Married    Spouse name: Not on file  . Number of children: 2  .  Years of education: Not on file  . Highest education level: Associate degree: academic program  Occupational History  . Occupation: Retired  Scientific laboratory technician  . Financial resource strain: Not hard at all  . Food insecurity:    Worry: Never true    Inability: Never true  . Transportation needs:    Medical: No    Non-medical: No  Tobacco Use  . Smoking status: Never Smoker  . Smokeless tobacco: Never Used  . Tobacco comment: smoking cessation materials not required  Substance and Sexual Activity  . Alcohol use: No    Alcohol/week: 0.0 standard drinks  . Drug use: No  . Sexual activity: Yes  Lifestyle  . Physical activity:    Days per week: 0 days    Minutes per session: 0 min  . Stress: Not at all  Relationships  . Social connections:    Talks on phone: Patient refused    Gets together: Patient refused    Attends religious service: Patient refused    Active member of club or organization: Patient refused    Attends meetings of clubs or organizations: Patient refused    Relationship status: Married  . Intimate partner violence:    Fear of current or ex partner: No    Emotionally abused: No    Physically abused: No    Forced sexual activity: No  Other Topics Concern  . Not on file  Social History Narrative  . Not on file    Vital Signs: Blood pressure 120/70, pulse 70, resp. rate 16, height 5\' 3"  (1.6 m), weight 187 lb (84.8 kg), SpO2 97 %.  Examination: General Appearance: The patient is well-developed, well-nourished, and in no distress. Skin: Gross inspection of skin unremarkable. Head: normocephalic, no gross deformities. Eyes: no gross deformities noted. ENT: ears appear grossly normal no exudates. Neck: Supple. No thyromegaly. No LAD. Respiratory: no rhonchi are noted. Cardiovascular: Normal S1 and S2 without murmur or rub. Extremities: No cyanosis. pulses are equal. Neurologic: Alert and oriented. No involuntary movements.  LABS: Recent Results (from the  past 2160 hour(s))  CBC with Differential/Platelet     Status: Abnormal   Collection Time: 08/11/18 11:20 AM  Result Value Ref Range   WBC 12.1 (H) 3.6 - 11.0 K/uL   RBC 5.07 3.80 - 5.20 MIL/uL   Hemoglobin 12.9 12.0 - 16.0 g/dL   HCT 40.8 35.0 - 47.0 %   MCV 80.5 80.0 - 100.0 fL   MCH 25.5 (L) 26.0 - 34.0 pg   MCHC 31.7 (L) 32.0 - 36.0 g/dL   RDW 13.5 11.5 - 14.5 %   Platelets 394 150 - 440 K/uL   Neutrophils Relative % 60 %   Neutro Abs 7.2 (H) 1.4 - 6.5 K/uL   Lymphocytes Relative 29 %   Lymphs Abs 3.6 1.0 - 3.6 K/uL   Monocytes Relative 9 %  Monocytes Absolute 1.1 (H) 0.2 - 0.9 K/uL   Eosinophils Relative 1 %   Eosinophils Absolute 0.2 0 - 0.7 K/uL   Basophils Relative 1 %   Basophils Absolute 0.1 0 - 0.1 K/uL    Comment: Performed at [facility], [address] [place], [place] [address]  HgB A1c     Status: Abnormal   Collection Time: 09/19/18 10:58 AM  Result Value Ref Range   Hgb A1c MFr Bld 6.5 (H) 4.8 - 5.6 %    Comment:          Prediabetes: 5.7 - 6.4          Diabetes: >6.4          Glycemic control for adults with diabetes: <7.0    Est. average glucose Bld gHb Est-mCnc 140 mg/dL  Renal Function Panel     Status: Abnormal   Collection Time: 09/19/18 10:58 AM  Result Value Ref Range   Glucose 115 (H) 65 - 99 mg/dL   BUN 17 8 - 27 mg/dL   Creatinine, Ser 0.55 (L) 0.57 - 1.00 mg/dL   GFR calc non Af Amer 97 >59 mL/min/1.73   GFR calc Af Amer 111 >59 mL/min/1.73   BUN/Creatinine Ratio 31 (H) 12 - 28   Sodium 140 134 - 144 mmol/L   Potassium 4.4 3.5 - 5.2 mmol/L   Chloride 98 96 - 106 mmol/L   CO2 26 20 - 29 mmol/L   Calcium 10.1 8.7 - 10.3 mg/dL   Phosphorus 4.2 2.5 - 4.5 mg/dL   Albumin 4.7 3.6 - 4.8 g/dL  Hepatic function panel     Status: None   Collection Time: 09/19/18 10:58 AM  Result Value Ref Range   Total Protein 7.2 6.0 - 8.5 g/dL   Bilirubin Total 0.3 0.0 - 1.2 mg/dL   Bilirubin, Direct 0.09 0.00 - 0.40 mg/dL   Alkaline  Phosphatase 59 39 - 117 IU/L   AST 14 0 - 40 IU/L   ALT 15 0 - 32 IU/L  Lipid panel     Status: None   Collection Time: 09/19/18 10:58 AM  Result Value Ref Range   Cholesterol, Total 145 100 - 199 mg/dL   Triglycerides 111 0 - 149 mg/dL   HDL 50 >39 mg/dL   VLDL Cholesterol Cal 22 5 - 40 mg/dL   LDL Calculated 73 0 - 99 mg/dL   Chol/HDL Ratio 2.9 0.0 - 4.4 ratio    Comment:                                   T. Chol/HDL Ratio                                             Men  Women                               1/2 Avg.Risk  3.4    3.3                                   Avg.Risk  5.0    4.4  2X Avg.Risk  9.6    7.1                                3X Avg.Risk 23.4   11.0     Radiology: Dg Chest 2 View  Result Date: 08/11/2018 CLINICAL DATA:  Cough, fever, and weakness for 2 days EXAM: CHEST - 2 VIEW COMPARISON:  Chest x-ray of 09/12/2017 FINDINGS: No active infiltrate or effusion is seen. Mediastinal and hilar contours are unremarkable and the heart is mildly enlarged and stable. The descending thoracic aorta is somewhat ectatic. There are degenerative changes throughout the thoracic spine. IMPRESSION: No active cardiopulmonary disease. Electronically Signed   By: Ivar Drape M.D.   On: 08/11/2018 15:58    No results found.  No results found.    Assessment and Plan: Patient Active Problem List   Diagnosis Date Noted  . Allergic rhinitis 01/19/2018  . Hx of adenomatous colonic polyps 01/14/2018  . Reactive airway disease without complication 86/57/8469  . Back ache 05/22/2015  . Alimentary obesity 05/22/2015  . Type 2 diabetes mellitus with complication, without long-term current use of insulin ([place]) 04/12/2014  . Hypercholesterolemia 04/12/2014  . OP (osteoporosis) 04/12/2014    1. Asthma has been relatively well controlled we will continue with supportive care continue with current medical therapy. 2. Allergic rhinitis she has continued to do  fine on allergy shots these will be continued in 6 to 12 months hopefully we should be able to discontinue the shots and then assess her clinically.  If her symptoms do return she may need to be placed back on maintenance therapy 3. Obesity discussed at length with her regarding weight loss.  Discussed dietary management and exercise.  We will continue with supportive care  General Counseling: I have discussed the findings of the evaluation and examination with Marlowe Kays.  I have also discussed any further diagnostic evaluation thatmay be needed or ordered today. Nakeysha verbalizes understanding of the findings of todays visit. We also reviewed her medications today and discussed drug interactions and side effects including but not limited excessive drowsiness and altered mental states. We also discussed that there is always a risk not just to her but also people around her. she has been encouraged to call the office with any questions or concerns that should arise related to todays visit.    Time spent: 86min  I have personally obtained a history, examined the patient, evaluated laboratory and imaging results, formulated the assessment and plan and placed orders.    Allyne Gee, MD [facility] Pulmonary and Critical Care Sleep medicine

## 2018-11-07 NOTE — Patient Instructions (Signed)

## 2018-11-09 ENCOUNTER — Ambulatory Visit: Payer: Medicare Other | Admitting: Internal Medicine

## 2018-11-09 DIAGNOSIS — R0602 Shortness of breath: Secondary | ICD-10-CM | POA: Diagnosis not present

## 2018-11-09 LAB — PULMONARY FUNCTION TEST

## 2018-11-11 LAB — HM DIABETES EYE EXAM

## 2018-11-12 NOTE — Procedures (Signed)
[place], [address]  DATE OF SERVICE: November 09, 2018  Complete Pulmonary Function Testing Interpretation:  FINDINGS:  The forced vital capacity is 2.21 L which is normal.  The FEV1 is 1.60 L which is normal.  FEV1 FVC ratio is mildly decreased.  Postbronchodilator there is no significant change in the FEV1 however clinical improvement may occur in the absence of spirometric improvement.  Total lung capacity by plethysmography is normal.  Residual volume is mildly decreased residual volume total lung capacity ratio is decreased FRC is mildly decreased.  DLCO is mildly decreased and normal when corrected for alveolar volume.  IMPRESSION:  This pulmonary function study is within normal limits.  DLCO is noted to be mildly decreased but normal when corrected for alveolar volume clinical correlation recommended  Allyne Gee, MD [facility] Pulmonary Critical Care Medicine Sleep Medicine

## 2018-11-15 ENCOUNTER — Other Ambulatory Visit: Payer: Self-pay

## 2018-11-16 ENCOUNTER — Ambulatory Visit
Admission: RE | Admit: 2018-11-16 | Discharge: 2018-11-16 | Disposition: A | Discharge status: Home / Self Care | Payer: Medicare Other | Source: Ambulatory Visit | Attending: Family Medicine | Admitting: Family Medicine

## 2018-11-16 ENCOUNTER — Encounter: Payer: Self-pay | Admitting: Family Medicine

## 2018-11-16 ENCOUNTER — Ambulatory Visit
Admission: RE | Admit: 2018-11-16 | Discharge: 2018-11-16 | Disposition: A | Discharge status: Home / Self Care | Payer: Medicare Other | Attending: Family Medicine | Admitting: Family Medicine

## 2018-11-16 ENCOUNTER — Ambulatory Visit: Payer: Medicare Other | Admitting: Family Medicine

## 2018-11-16 VITALS — BP 120/60 | HR 84 | Ht 63.0 in | Wt 188.0 lb

## 2018-11-16 DIAGNOSIS — M545 Low back pain, unspecified: Secondary | ICD-10-CM

## 2018-11-16 DIAGNOSIS — G8929 Other chronic pain: Secondary | ICD-10-CM | POA: Diagnosis not present

## 2018-11-16 MED ORDER — MELOXICAM 15 MG PO TABS: 15.0000 mg | ORAL_TABLET | Freq: Every day | ORAL | 0 refills | Status: DC

## 2018-11-16 MED ORDER — CYCLOBENZAPRINE HCL 10 MG PO TABS: 10.0000 mg | ORAL_TABLET | Freq: Three times a day (TID) | ORAL | 0 refills | Status: DC | PRN
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
## 2018-11-16 NOTE — Patient Instructions (Signed)
Degenerative Disk Disease  Degenerative disk disease is a condition caused by changes that occur in the spinal disks as a person ages. Spinal disks are soft and compressible disks located between the bones of your spine (vertebrae). These disks act like shock absorbers. Degenerative disk disease can affect the whole spine. However, the neck and lower back are most often affected. Many changes can occur in the spinal disks with aging, such as:  The spinal disks may dry and shrink.  Small tears may occur in the tough, outer covering of the disk (annulus).  The disk space may become smaller due to loss of water.  Abnormal growths in the bone (spurs) may occur. This can put pressure on the nerve roots exiting the spinal canal, causing pain.  The spinal canal may become narrowed. What are the causes? This condition may be caused by:  Normal degeneration with age.  Injuries.  Certain activities and sports that cause damage. What increases the risk? The following factors may make you more likely to develop this condition:  Being overweight.  Having a family history of degenerative disk disease.  Smoking.  Sudden injury.  Doing work that requires heavy lifting. What are the signs or symptoms? Symptoms of this condition include:  Pain that varies in intensity. Some people have no pain, while others have severe pain. The location of the pain depends on the part of your backbone that is affected. You may have: ? Pain in your neck or arm if a disk in your neck area is affected. ? Pain in your back, buttocks, or legs if a disk in your lower back is affected.  Pain that becomes worse while bending or reaching up, or with twisting movements.  Pain that may start gradually and then get worse as time passes. It may also start after a major or minor injury.  Numbness or tingling in the arms or legs. How is this diagnosed? This condition may be diagnosed based on:  Your symptoms and  medical history.  A physical exam.  Imaging tests, including: ? An X-ray of the spine. ? MRI. How is this treated? This condition may be treated with:  Medicines.  Rehabilitation exercises. These activities aim to strengthen muscles in your back and abdomen to better support your spine. If treatments do not help to relieve your symptoms or you have severe pain, you may need surgery. Follow these instructions at home: Medicines  Take over-the-counter and prescription medicines only as told by your health care provider.  Do not drive or use heavy machinery while taking prescription pain medicine.  If you are taking prescription pain medicine, take actions to prevent or treat constipation. Your health care provider may recommend that you: ? Drink enough fluid to keep your urine pale yellow. ? Eat foods that are high in fiber, such as fresh fruits and vegetables, whole grains, and beans. ? Limit foods that are high in fat and processed sugars, such as fried or sweet foods. ? Take an over-the-counter or prescription medicine for constipation. Activity  Rest as told by your health care provider.  Ask your health care provider what activities are safe for you. Return to your normal activities as directed.  Avoid sitting for a long time without moving. Get up to take short walks every 1-2 hours. This is important to improve blood flow and breathing. Ask for help if you feel weak or unsteady.  Perform relaxation exercises as told by your health care provider.  Maintain good posture.    Do not lift anything that is heavier than 10 lb (4.5 kg), or the limit that you are told, until your health care provider says that it is safe.  Follow proper lifting and walking techniques as told by your health care provider. Managing pain, stiffness, and swelling   If directed, put ice on the painful area. Icing can help to relieve pain. ? Put ice in a plastic bag. ? Place a towel between your  skin and the bag. ? Leave the ice on for 20 minutes, 2-3 times a day.  If directed, apply heat to the painful area as often as told by your health care provider. Heat can reduce the stiffness of your muscles. Use the heat source that your health care provider recommends, such as a moist heat pack or a heating pad. ? Place a towel between your skin and the heat source. ? Leave the heat on for 20-30 minutes. ? Remove the heat if your skin turns bright red. This is especially important if you are unable to feel pain, heat, or cold. You may have a greater risk of getting burned. General instructions  Change your sitting, standing, and sleeping habits as told by your health care provider.  Avoid sitting in the same position for long periods of time. Change positions frequently.  Lose weight or maintain a healthy weight as told by your health care provider.  Do not use any products that contain nicotine or tobacco, such as cigarettes and e-cigarettes. If you need help quitting, ask your health care provider.  Wear supportive footwear.  Keep all follow-up visits as told by your health care provider. This is important. This may include visits for physical therapy. Contact a health care provider if you:  Have pain that does not go away within 1-4 weeks.  Lose your appetite.  Lose weight without trying. Get help right away if you:  Have severe pain.  Notice weakness in your arms, hands, or legs.  Begin to lose control of your bladder or bowel movements.  Have fevers or night sweats. Summary  Degenerative disk disease is a condition caused by changes that occur in the spinal disks as a person ages.  Degenerative disk disease can affect the whole spine. However, the neck and lower back are most often affected.  Take over-the-counter and prescription medicines only as told by your health care provider. This information is not intended to replace advice given to you by your health care  provider. Make sure you discuss any questions you have with your health care provider. Document Released: 09/13/2007 Document Revised: 11/11/2017 Document Reviewed: 11/11/2017 Elsevier Interactive Patient Education  2019 [employer].

## 2018-11-17 ENCOUNTER — Other Ambulatory Visit: Payer: Self-pay

## 2018-11-17 MED ORDER — ALENDRONATE SODIUM 70 MG PO TABS: 70.0000 mg | ORAL_TABLET | ORAL | 4 refills | Status: DC

## 2018-12-07 ENCOUNTER — Ambulatory Visit (INDEPENDENT_AMBULATORY_CARE_PROVIDER_SITE_OTHER): Payer: Medicare Other

## 2018-12-07 DIAGNOSIS — J301 Allergic rhinitis due to pollen: Secondary | ICD-10-CM | POA: Diagnosis not present

## 2019-01-10 ENCOUNTER — Ambulatory Visit (INDEPENDENT_AMBULATORY_CARE_PROVIDER_SITE_OTHER): Payer: Medicare Other

## 2019-01-10 DIAGNOSIS — J301 Allergic rhinitis due to pollen: Secondary | ICD-10-CM

## 2019-02-06 ENCOUNTER — Ambulatory Visit: Payer: Medicare Other | Admitting: Family Medicine

## 2019-02-06 ENCOUNTER — Encounter: Payer: Self-pay | Admitting: Family Medicine

## 2019-02-06 VITALS — BP 138/62 | HR 68 | Temp 98.8°F | Ht 63.0 in | Wt 189.0 lb

## 2019-02-06 DIAGNOSIS — J0100 Acute maxillary sinusitis, unspecified: Secondary | ICD-10-CM | POA: Diagnosis not present

## 2019-02-06 MED ORDER — AZITHROMYCIN 250 MG PO TABS: ORAL_TABLET | ORAL | 0 refills | Status: DC

## 2019-02-06 NOTE — Progress Notes (Signed)
Date:  02/06/2019   Name:  Lindsey George   DOB:  [birth year]   MRN:  [id number]   Chief Complaint: Sinusitis (started Sat with cong, no fever, has a non productive cough. Nasal cong gets worse at night causing mouth breathing)  Sinusitis  This is a new problem. The current episode started in the past 7 days (Saturday). The problem has been gradually worsening since onset. There has been no fever. Her pain is at a severity of 0/10. She is experiencing no pain. Associated symptoms include congestion, coughing, headaches, a hoarse voice and sinus pressure. Pertinent negatives include no chills, diaphoresis, ear pain, neck pain, shortness of breath, sneezing, sore throat or swollen glands. (Facial pain) Past treatments include nothing. The treatment provided moderate relief.    Review of Systems  Constitutional: Negative.  Negative for chills, diaphoresis, fatigue, fever and unexpected weight change.  HENT: Positive for congestion, hoarse voice and sinus pressure. Negative for ear discharge, ear pain, mouth sores, postnasal drip, rhinorrhea, sinus pain, sneezing and sore throat.   Eyes: Negative for photophobia, pain, discharge, redness and itching.  Respiratory: Positive for cough. Negative for shortness of breath, wheezing and stridor.   Cardiovascular: Negative for chest pain, palpitations and leg swelling.  Gastrointestinal: Negative for abdominal pain, anal bleeding, blood in stool, constipation, diarrhea, nausea and vomiting.  Endocrine: Negative for cold intolerance, heat intolerance, polydipsia, polyphagia and polyuria.  Genitourinary: Negative for dysuria, flank pain, frequency, hematuria, menstrual problem, pelvic pain, urgency, vaginal bleeding and vaginal discharge.  Musculoskeletal: Negative for arthralgias, back pain, myalgias and neck pain.  Skin: Negative for rash.  Allergic/Immunologic: Negative for environmental allergies and food allergies.  Neurological: Positive for  headaches. Negative for dizziness, syncope, speech difficulty, weakness, light-headedness and numbness.  Hematological: Negative for adenopathy. Does not bruise/bleed easily.  Psychiatric/Behavioral: Negative for dysphoric mood. The patient is not nervous/anxious.     Patient Active Problem List   Diagnosis Date Noted  . Allergic rhinitis 01/19/2018  . Hx of adenomatous colonic polyps 01/14/2018  . Reactive airway disease without complication 74/07/1447  . Back ache 05/22/2015  . Alimentary obesity 05/22/2015  . Type 2 diabetes mellitus with complication, without long-term current use of insulin ([place]) 04/12/2014  . Hypercholesterolemia 04/12/2014  . OP (osteoporosis) 04/12/2014    Allergies  Allergen Reactions  . Codeine Other (See Comments)    Past Surgical History:  Procedure Laterality Date  . ABDOMINAL HYSTERECTOMY    . BREAST BIOPSY Right 06/22/11   bx/clip-neg  . BREAST CYST ASPIRATION Left    neg  . COLONOSCOPY  2014   cleared for 5 yrs- [place] doc  . COLONOSCOPY WITH PROPOFOL N/A 03/25/2018   Procedure: COLONOSCOPY WITH PROPOFOL;  Surgeon: Manya Silvas, MD;  Location: [facility] ENDOSCOPY;  Service: Endoscopy;  Laterality: N/A;  . PARTIAL HYSTERECTOMY    . SPLENECTOMY      Social History   Tobacco Use  . Smoking status: Never Smoker  . Smokeless tobacco: Never Used  . Tobacco comment: smoking cessation materials not required  Substance Use Topics  . Alcohol use: No    Alcohol/week: 0.0 standard drinks  . Drug use: No     Medication list has been reviewed and updated.  Current Meds  Medication Sig  . albuterol (PROVENTIL HFA;VENTOLIN HFA) 108 (90 Base) MCG/ACT inhaler Inhale 2 puffs into the lungs every 6 (six) hours as needed for wheezing or shortness of breath.  Marland Kitchen albuterol (PROVENTIL) (2.5 MG/3ML) 0.083% nebulizer solution Take  3 mLs (2.5 mg total) by nebulization every 6 (six) hours as needed for wheezing or shortness of breath.  Marland Kitchen alendronate (FOSAMAX) 70  MG tablet Take 1 tablet (70 mg total) by mouth every 7 (seven) days. Take with a full glass of water on an empty stomach.  Marland Kitchen aspirin 81 MG tablet Take 81 mg by mouth daily.  . calcium-vitamin D (OSCAL WITH D) 500-200 MG-UNIT tablet Take 1 tablet by mouth.  . EPIPEN 2-PAK 0.3 MG/0.3ML SOAJ injection   . glipiZIDE (GLIPIZIDE XL) 5 MG 24 hr tablet Take 1 tablet (5 mg total) by mouth daily.  Marland Kitchen lisinopril (PRINIVIL,ZESTRIL) 2.5 MG tablet Take 1 tablet (2.5 mg total) by mouth daily.  Marland Kitchen loratadine (CLARITIN) 10 MG tablet Take 10 mg by mouth daily. otc  . metFORMIN (GLUCOPHAGE) 500 MG tablet TAKE ONE TABLET BY MOUTH TWICE DAILY  . Multiple Vitamin (MULTIVITAMIN) capsule Take 1 capsule by mouth daily.  . pioglitazone (ACTOS) 15 MG tablet Take 1 tablet (15 mg total) by mouth daily.  . simvastatin (ZOCOR) 40 MG tablet TAKE ONE TABLET BY MOUTH ONCE DAILY   Current Facility-Administered Medications for the 02/06/19 encounter (Office Visit) with Juline Patch, MD  Medication  . albuterol (PROVENTIL) (2.5 MG/3ML) 0.083% nebulizer solution 2.5 mg  . ipratropium-albuterol (DUONEB) 0.5-2.5 (3) MG/3ML nebulizer solution 3 mL    PHQ 2/9 Scores 06/01/2018 02/24/2018 08/05/2017 08/05/2017  PHQ - 2 Score 0 0 1 1  PHQ- 9 Score 0 - 2 -    Physical Exam Vitals signs and nursing note reviewed.  Constitutional:      General: She is not in acute distress.    Appearance: She is not diaphoretic.  HENT:     Head: Normocephalic and atraumatic.     Right Ear: Tympanic membrane, ear canal and external ear normal.     Left Ear: Tympanic membrane, ear canal and external ear normal.     Nose: No congestion or rhinorrhea.     Right Sinus: Maxillary sinus tenderness present.     Left Sinus: Maxillary sinus tenderness present.     Mouth/Throat:     Mouth: Mucous membranes are moist.  Eyes:     General:        Right eye: No discharge.        Left eye: No discharge.     Conjunctiva/sclera: Conjunctivae normal.     Pupils:  Pupils are equal, round, and reactive to light.  Neck:     Musculoskeletal: Normal range of motion and neck supple. No neck rigidity or muscular tenderness.     Thyroid: No thyromegaly.     Vascular: No carotid bruit or JVD.  Cardiovascular:     Rate and Rhythm: Normal rate and regular rhythm.     Pulses: Normal pulses.     Heart sounds: Normal heart sounds. No murmur. No friction rub. No gallop.   Pulmonary:     Effort: Pulmonary effort is normal.     Breath sounds: Normal breath sounds. No wheezing, rhonchi or rales.  Chest:     Chest wall: No tenderness.  Abdominal:     General: Bowel sounds are normal. There is no distension.     Palpations: Abdomen is soft. There is no mass.     Tenderness: There is no abdominal tenderness. There is no right CVA tenderness, left CVA tenderness, guarding or rebound.     Hernia: No hernia is present.  Musculoskeletal: Normal range of motion.  Lymphadenopathy:  Cervical: No cervical adenopathy.  Skin:    General: Skin is warm and dry.  Neurological:     Mental Status: She is alert.     Deep Tendon Reflexes: Reflexes are normal and symmetric.     Wt Readings from Last 3 Encounters:  02/06/19 189 lb (85.7 kg)  11/16/18 188 lb (85.3 kg)  11/07/18 187 lb (84.8 kg)    BP 138/62   Pulse 68   Temp 98.8 F (37.1 C) (Oral)   Ht 5\' 3"  (1.6 m)   Wt 189 lb (85.7 kg)   BMI 33.48 kg/m   Assessment and Plan: 1. Acute maxillary sinusitis, recurrence not specified Acute. Onset two days ago. Will start ZPack use as directed and return to clinic as needed. - azithromycin (ZITHROMAX) 250 MG tablet; 2 today then 1 a day for 4 days  Dispense: 6 tablet; Refill: 0

## 2019-02-08 ENCOUNTER — Ambulatory Visit (INDEPENDENT_AMBULATORY_CARE_PROVIDER_SITE_OTHER): Payer: Medicare Other

## 2019-02-08 DIAGNOSIS — J301 Allergic rhinitis due to pollen: Secondary | ICD-10-CM | POA: Diagnosis not present

## 2019-03-09 ENCOUNTER — Ambulatory Visit: Payer: Self-pay | Admitting: Nurse Practitioner

## 2019-03-24 ENCOUNTER — Other Ambulatory Visit: Payer: Self-pay | Admitting: Family Medicine

## 2019-03-29 ENCOUNTER — Encounter: Payer: Self-pay | Admitting: Family Medicine

## 2019-03-29 ENCOUNTER — Ambulatory Visit: Payer: Medicare Other | Admitting: Family Medicine

## 2019-03-29 ENCOUNTER — Other Ambulatory Visit: Payer: Self-pay

## 2019-03-29 VITALS — BP 122/62 | HR 88 | Ht 63.0 in | Wt 189.0 lb

## 2019-03-29 DIAGNOSIS — E7800 Pure hypercholesterolemia, unspecified: Secondary | ICD-10-CM

## 2019-03-29 DIAGNOSIS — M810 Age-related osteoporosis without current pathological fracture: Secondary | ICD-10-CM

## 2019-03-29 DIAGNOSIS — E119 Type 2 diabetes mellitus without complications: Secondary | ICD-10-CM

## 2019-03-29 DIAGNOSIS — M19019 Primary osteoarthritis, unspecified shoulder: Secondary | ICD-10-CM | POA: Diagnosis not present

## 2019-03-29 DIAGNOSIS — I10 Essential (primary) hypertension: Secondary | ICD-10-CM

## 2019-03-29 MED ORDER — PIOGLITAZONE HCL 15 MG PO TABS: 15.0000 mg | ORAL_TABLET | Freq: Every day | ORAL | 1 refills | Status: DC

## 2019-03-29 MED ORDER — ALENDRONATE SODIUM 70 MG PO TABS: 70.0000 mg | ORAL_TABLET | ORAL | 2 refills | Status: DC

## 2019-03-29 MED ORDER — METFORMIN HCL 500 MG PO TABS: ORAL_TABLET | ORAL | 1 refills | Status: DC

## 2019-03-29 MED ORDER — GLIPIZIDE ER 5 MG PO TB24: 5.0000 mg | ORAL_TABLET | Freq: Every day | ORAL | 1 refills | Status: DC

## 2019-03-29 MED ORDER — SIMVASTATIN 40 MG PO TABS: ORAL_TABLET | ORAL | 1 refills | Status: DC

## 2019-03-29 MED ORDER — LOSARTAN POTASSIUM 25 MG PO TABS: 25.0000 mg | ORAL_TABLET | Freq: Every day | ORAL | 1 refills | Status: DC

## 2019-03-29 MED ORDER — MELOXICAM 7.5 MG PO TABS: 7.5000 mg | ORAL_TABLET | Freq: Every day | ORAL | 1 refills | Status: DC

## 2019-03-29 NOTE — Progress Notes (Signed)
Date:  03/29/2019   Name:  Lindsey George   DOB:  [birth year]   MRN:  [id number]   Chief Complaint: Hypertension; Hyperlipidemia; Diabetes (needs foot exam); Osteoporosis; and arthritis pain (knee, shoulder and hip)  Hypertension  This is a chronic problem. The current episode started more than 1 year ago. The problem is unchanged. The problem is controlled. Pertinent negatives include no anxiety, blurred vision, chest pain, headaches, malaise/fatigue, neck pain, orthopnea, palpitations, peripheral edema, PND, shortness of breath or sweats. There are no associated agents to hypertension. There are no known risk factors for coronary artery disease. Past treatments include ACE inhibitors. The current treatment provides mild improvement. There are no compliance problems.  There is no history of angina, kidney disease, CAD/MI, CVA, heart failure, left ventricular hypertrophy, PVD or retinopathy. There is no history of chronic renal disease, coarctation of the aorta, a hypertension causing med, pheochromocytoma or renovascular disease.  Hyperlipidemia  This is a chronic problem. The problem is controlled. Recent lipid tests were reviewed and are normal. Exacerbating diseases include diabetes. She has no history of chronic renal disease, hypothyroidism, liver disease, obesity or nephrotic syndrome. There are no known factors aggravating her hyperlipidemia. Pertinent negatives include no chest pain, focal sensory loss, focal weakness, leg pain, myalgias or shortness of breath. Current antihyperlipidemic treatment includes statins. The current treatment provides significant improvement of lipids. There are no compliance problems.  Risk factors for coronary artery disease include diabetes mellitus, dyslipidemia, hypertension and obesity.  Diabetes  She presents for her follow-up diabetic visit. She has type 2 diabetes mellitus. Her disease course has been stable. There are no hypoglycemic associated  symptoms. Pertinent negatives for hypoglycemia include no confusion, dizziness, headaches, hunger, mood changes, nervousness/anxiousness, pallor, seizures or sweats. Pertinent negatives for diabetes include no blurred vision, no chest pain, no fatigue, no foot paresthesias, no foot ulcerations, no polydipsia, no polyphagia, no polyuria, no visual change, no weakness and no weight loss. There are no hypoglycemic complications. Symptoms are stable. Pertinent negatives for diabetic complications include no autonomic neuropathy, CVA, heart disease, impotence, nephropathy, peripheral neuropathy, PVD or retinopathy. Risk factors for coronary artery disease include diabetes mellitus and dyslipidemia. She is compliant with treatment all of the time. She is following a generally healthy diet. Meal planning includes avoidance of concentrated sweets and carbohydrate counting. She has not had a previous visit with a dietitian. She participates in exercise daily. Her breakfast blood glucose is taken between 8-9 am. Her lunch blood glucose range is generally 140-180 mg/dl. An ACE inhibitor/angiotensin II receptor blocker is being taken. She does not see a podiatrist.Eye exam is current.  Shoulder Pain   The pain is present in the left shoulder. This is a new problem. The current episode started more than 1 month ago. There has been a history of trauma. The problem occurs intermittently. The problem has been unchanged. The quality of the pain is described as aching. The pain is at a severity of 5/10. The pain is mild. Pertinent negatives include no fever, inability to bear weight, itching, joint locking, joint swelling, limited range of motion, numbness, stiffness or tingling. The symptoms are aggravated by activity. She has tried nothing for the symptoms. Her past medical history is significant for diabetes.    Review of Systems  Constitutional: Negative.  Negative for chills, fatigue, fever, malaise/fatigue, unexpected  weight change and weight loss.  HENT: Negative for congestion, ear discharge, ear pain, rhinorrhea, sinus pressure, sneezing and sore throat.  Eyes: Negative for blurred vision, photophobia, pain, discharge, redness and itching.  Respiratory: Positive for cough. Negative for shortness of breath, wheezing and stridor.   Cardiovascular: Negative for chest pain, palpitations, orthopnea and PND.  Gastrointestinal: Negative for abdominal pain, blood in stool, constipation, diarrhea, nausea and vomiting.  Endocrine: Negative for cold intolerance, heat intolerance, polydipsia, polyphagia and polyuria.  Genitourinary: Negative for dysuria, flank pain, frequency, hematuria, impotence, menstrual problem, pelvic pain, urgency, vaginal bleeding and vaginal discharge.  Musculoskeletal: Positive for arthralgias. Negative for back pain, myalgias, neck pain and stiffness.       Shoulder/hip  Skin: Negative for itching, pallor and rash.  Allergic/Immunologic: Negative for environmental allergies and food allergies.  Neurological: Negative for dizziness, tingling, focal weakness, seizures, weakness, light-headedness, numbness and headaches.  Hematological: Negative for adenopathy. Does not bruise/bleed easily.  Psychiatric/Behavioral: Negative for confusion and dysphoric mood. The patient is not nervous/anxious.     Patient Active Problem List   Diagnosis Date Noted  . Allergic rhinitis 01/19/2018  . Hx of adenomatous colonic polyps 01/14/2018  . Reactive airway disease without complication 19/50/9326  . Back ache 05/22/2015  . Alimentary obesity 05/22/2015  . Type 2 diabetes mellitus with complication, without long-term current use of insulin ([place]) 04/12/2014  . Hypercholesterolemia 04/12/2014  . OP (osteoporosis) 04/12/2014    Allergies  Allergen Reactions  . Codeine Other (See Comments)    Past Surgical History:  Procedure Laterality Date  . ABDOMINAL HYSTERECTOMY    . BREAST BIOPSY Right  06/22/11   bx/clip-neg  . BREAST CYST ASPIRATION Left    neg  . COLONOSCOPY  2014   cleared for 5 yrs- [place] doc  . COLONOSCOPY WITH PROPOFOL N/A 03/25/2018   Procedure: COLONOSCOPY WITH PROPOFOL;  Surgeon: Manya Silvas, MD;  Location: [facility] ENDOSCOPY;  Service: Endoscopy;  Laterality: N/A;  . PARTIAL HYSTERECTOMY    . SPLENECTOMY      Social History   Tobacco Use  . Smoking status: Never Smoker  . Smokeless tobacco: Never Used  . Tobacco comment: smoking cessation materials not required  Substance Use Topics  . Alcohol use: No    Alcohol/week: 0.0 standard drinks  . Drug use: No     Medication list has been reviewed and updated.  Current Meds  Medication Sig  . albuterol (PROVENTIL HFA;VENTOLIN HFA) 108 (90 Base) MCG/ACT inhaler Inhale 2 puffs into the lungs every 6 (six) hours as needed for wheezing or shortness of breath.  Marland Kitchen albuterol (PROVENTIL) (2.5 MG/3ML) 0.083% nebulizer solution Take 3 mLs (2.5 mg total) by nebulization every 6 (six) hours as needed for wheezing or shortness of breath.  Marland Kitchen alendronate (FOSAMAX) 70 MG tablet Take 1 tablet (70 mg total) by mouth every 7 (seven) days. Take with a full glass of water on an empty stomach.  Marland Kitchen aspirin 81 MG tablet Take 81 mg by mouth daily.  . calcium-vitamin D (OSCAL WITH D) 500-200 MG-UNIT tablet Take 1 tablet by mouth.  . EPIPEN 2-PAK 0.3 MG/0.3ML SOAJ injection   . glipiZIDE (GLIPIZIDE XL) 5 MG 24 hr tablet Take 1 tablet (5 mg total) by mouth daily.  Marland Kitchen lisinopril (PRINIVIL,ZESTRIL) 2.5 MG tablet Take 1 tablet (2.5 mg total) by mouth daily.  Marland Kitchen loratadine (CLARITIN) 10 MG tablet Take 10 mg by mouth daily. otc  . metFORMIN (GLUCOPHAGE) 500 MG tablet TAKE ONE TABLET BY MOUTH TWICE DAILY  . Multiple Vitamin (MULTIVITAMIN) capsule Take 1 capsule by mouth daily.  . pioglitazone (ACTOS) 15 MG tablet  Take 1 tablet (15 mg total) by mouth daily.  . simvastatin (ZOCOR) 40 MG tablet TAKE ONE TABLET BY MOUTH ONCE DAILY  .  [DISCONTINUED] azithromycin (ZITHROMAX) 250 MG tablet 2 today then 1 a day for 4 days   Current Facility-Administered Medications for the 03/29/19 encounter (Office Visit) with Juline Patch, MD  Medication  . albuterol (PROVENTIL) (2.5 MG/3ML) 0.083% nebulizer solution 2.5 mg  . ipratropium-albuterol (DUONEB) 0.5-2.5 (3) MG/3ML nebulizer solution 3 mL    PHQ 2/9 Scores 03/29/2019 06/01/2018 02/24/2018 08/05/2017  PHQ - 2 Score 2 0 0 1  PHQ- 9 Score 4 0 - 2    BP Readings from Last 3 Encounters:  03/29/19 122/62  02/06/19 138/62  11/16/18 120/60    Physical Exam Vitals signs and nursing note reviewed.  Constitutional:      General: She is not in acute distress.    Appearance: She is not diaphoretic.  HENT:     Head: Normocephalic and atraumatic.     Right Ear: External ear normal.     Left Ear: External ear normal.     Nose: Nose normal.  Eyes:     General:        Right eye: No discharge.        Left eye: No discharge.     Conjunctiva/sclera: Conjunctivae normal.     Pupils: Pupils are equal, round, and reactive to light.  Neck:     Musculoskeletal: Normal range of motion and neck supple.     Thyroid: No thyromegaly.     Vascular: No JVD.  Cardiovascular:     Rate and Rhythm: Normal rate and regular rhythm.     Pulses:          Dorsalis pedis pulses are 2+ on the right side and 2+ on the left side.       Posterior tibial pulses are 2+ on the right side and 2+ on the left side.     Heart sounds: Normal heart sounds. No murmur. No friction rub. No gallop.   Pulmonary:     Effort: Pulmonary effort is normal.     Breath sounds: Normal breath sounds.  Abdominal:     General: Bowel sounds are normal.     Palpations: Abdomen is soft. There is no mass.     Tenderness: There is no abdominal tenderness. There is no guarding.  Musculoskeletal: Normal range of motion.     Left shoulder: She exhibits tenderness.     Right foot: Normal range of motion. No deformity or bunion.      Left foot: Normal range of motion. No deformity or bunion.     Comments: Tender bicep tendon  Feet:     Right foot:     Protective Sensation: 10 sites tested. 10 sites sensed.     Skin integrity: Skin integrity normal.     Toenail Condition: Right toenails are normal.     Left foot:     Protective Sensation: 10 sites tested. 10 sites sensed.     Skin integrity: Skin integrity normal.     Toenail Condition: Left toenails are normal.  Lymphadenopathy:     Cervical: No cervical adenopathy.  Skin:    General: Skin is warm and dry.  Neurological:     Mental Status: She is alert.     Deep Tendon Reflexes: Reflexes are normal and symmetric.     Wt Readings from Last 3 Encounters:  03/29/19 189 lb (85.7 kg)  02/06/19 189  lb (85.7 kg)  11/16/18 188 lb (85.3 kg)    BP 122/62   Pulse 88   Ht 5\' 3"  (1.6 m)   Wt 189 lb (85.7 kg)   BMI 33.48 kg/m   Assessment and Plan:  1. Type 2 diabetes mellitus without complication, without long-term current use of insulin (HCC) Chronic.  Controlled.  Continue glipizide XL 5 mg 1 a day, metformin 500 mg 1 twice a day, and Actos 15 mg 1 a day.  Will check microalbuminuria and hemoglobin A1c today. - Microalbumin, urine - HgB A1c - glipiZIDE (GLIPIZIDE XL) 5 MG 24 hr tablet; Take 1 tablet (5 mg total) by mouth daily.  Dispense: 90 tablet; Refill: 1 - metFORMIN (GLUCOPHAGE) 500 MG tablet; TAKE ONE TABLET BY MOUTH TWICE DAILY  Dispense: 180 tablet; Refill: 1 - pioglitazone (ACTOS) 15 MG tablet; Take 1 tablet (15 mg total) by mouth daily.  Dispense: 90 tablet; Refill: 1 - losartan (COZAAR) 25 MG tablet; Take 1 tablet (25 mg total) by mouth daily.  Dispense: 90 tablet; Refill: 1  2. Hypercholesterolemia Chronic.  Controlled.  Continue simvastatin 40 mg once a day will check lipid panel. - Lipid Panel With LDL/HDL Ratio - simvastatin (ZOCOR) 40 MG tablet; TAKE ONE TABLET BY MOUTH ONCE DAILY  Dispense: 90 tablet; Refill: 1  3. Essential  hypertension Chronic.  Controlled.  Patient is having a cough on lisinopril we will discontinue this and replaced with losartan 25 mg 1 a day.  Will check a renal function panel today. - Renal Function Panel - losartan (COZAAR) 25 MG tablet; Take 1 tablet (25 mg total) by mouth daily.  Dispense: 90 tablet; Refill: 1  4. Osteoporosis Continue Fosamax 70 mg once a day - alendronate (FOSAMAX) 70 MG tablet; Take 1 tablet (70 mg total) by mouth every 7 (seven) days. Take with a full glass of water on an empty stomach.  Dispense: 8 tablet; Refill: 2  5. Shoulder arthritis Acute.  Will initiate meloxicam 7.5 mg 1 a day. - meloxicam (MOBIC) 7.5 MG tablet; Take 1 tablet (7.5 mg total) by mouth daily.  Dispense: 30 tablet; Refill: 1  6. Morbid obesity ([place]) At further discussion about weight loss and exercise.Health risks of being over weight were discussed and patient was counseled on weight loss options and exercise.

## 2019-03-30 LAB — RENAL FUNCTION PANEL
Albumin: 4.9 g/dL — ABNORMAL HIGH (ref 3.8–4.8)
BUN/Creatinine Ratio: 26 (ref 12–28)
BUN: 15 mg/dL (ref 8–27)
CO2: 23 mmol/L (ref 20–29)
Calcium: 10.9 mg/dL — ABNORMAL HIGH (ref 8.7–10.3)
Chloride: 98 mmol/L (ref 96–106)
Creatinine, Ser: 0.57 mg/dL (ref 0.57–1.00)
GFR calc Af Amer: 109 mL/min/{1.73_m2} (ref >59)
GFR calc non Af Amer: 95 mL/min/{1.73_m2} (ref >59)
Glucose: 132 mg/dL — ABNORMAL HIGH (ref 65–99)
Phosphorus: 4.5 mg/dL — ABNORMAL HIGH (ref 3.0–4.3)
Potassium: 4.6 mmol/L (ref 3.5–5.2)
Sodium: 141 mmol/L (ref 134–144)

## 2019-03-30 LAB — LIPID PANEL WITH LDL/HDL RATIO
Cholesterol, Total: 130 mg/dL (ref 100–199)
HDL: 49 mg/dL (ref >39)
LDL Calculated: 60 mg/dL (ref 0–99)
LDl/HDL Ratio: 1.2 ratio (ref 0.0–3.2)
Triglycerides: 104 mg/dL (ref 0–149)
VLDL Cholesterol Cal: 21 mg/dL (ref 5–40)

## 2019-03-30 LAB — MICROALBUMIN, URINE: Microalbumin, Urine: 175.3 ug/mL

## 2019-03-30 LAB — HEMOGLOBIN A1C
Est. average glucose Bld gHb Est-mCnc: 146 mg/dL
Hgb A1c MFr Bld: 6.7 % — ABNORMAL HIGH (ref 4.8–5.6)

## 2019-04-21 IMAGING — MG MM DIGITAL SCREENING BILAT W/ TOMO W/ CAD
8 of 12 series · 8 of 28 positions shown · non-contrast
Comparison: Previous exam(s).

CLINICAL DATA: Screening.

EXAM:
2D DIGITAL SCREENING BILATERAL MAMMOGRAM WITH CAD AND ADJUNCT TOMO

[L MLO]
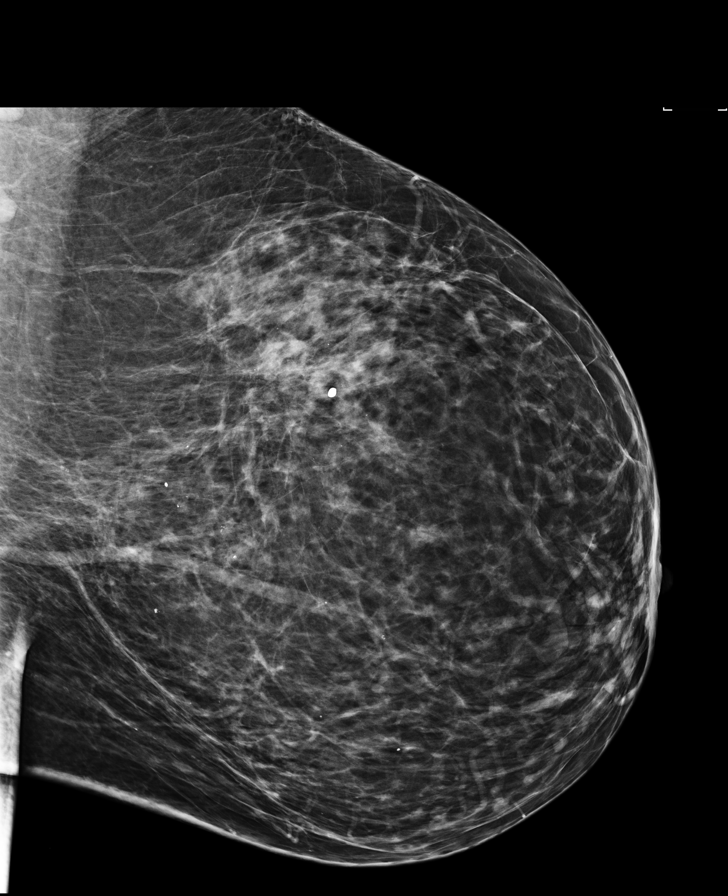

[L CC synth-2D]
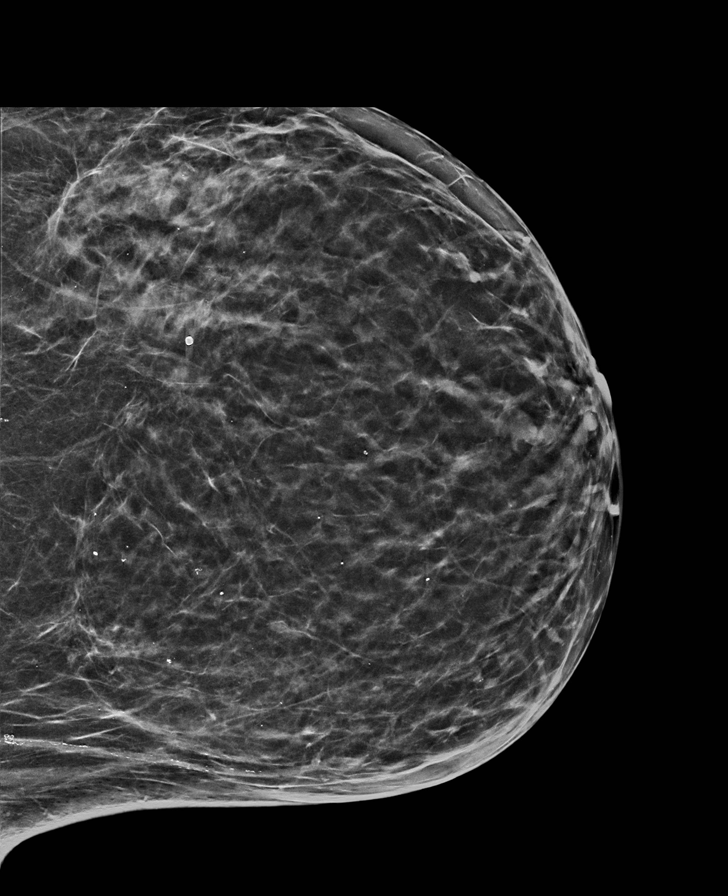

[L CC]
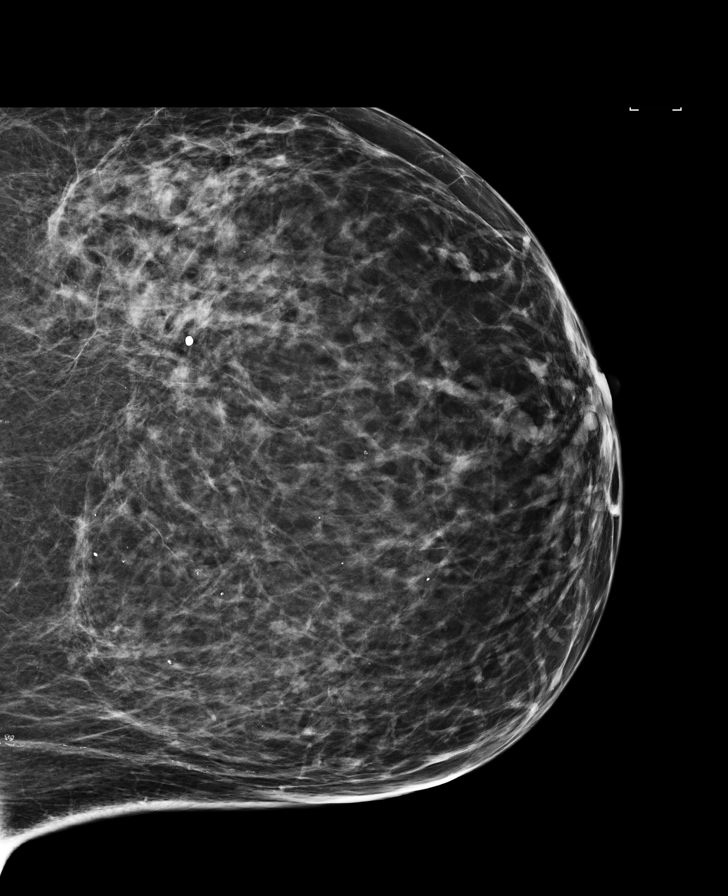

[R MLO synth-2D]
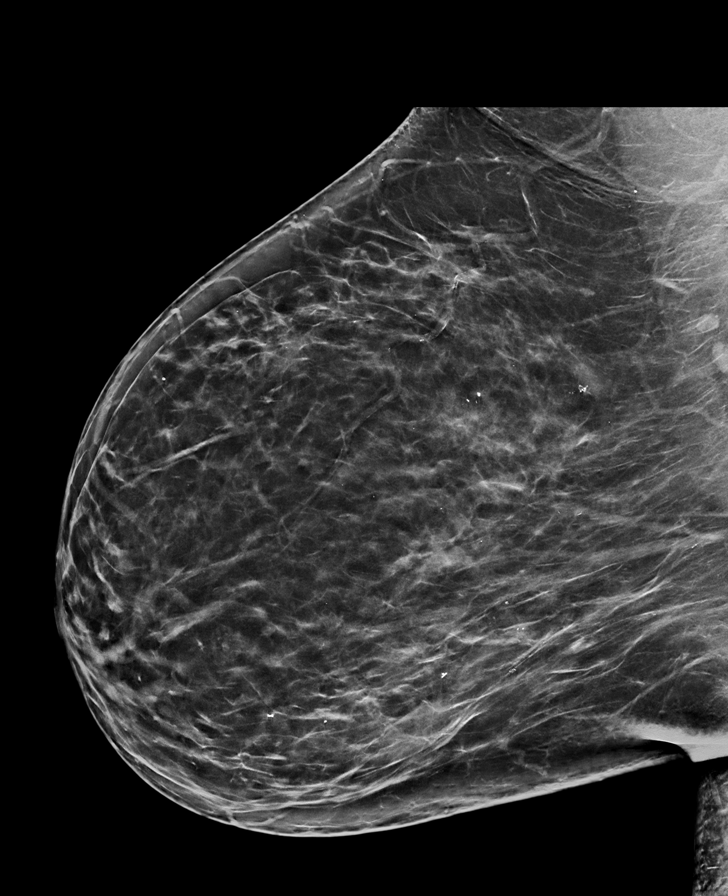

[R CC]
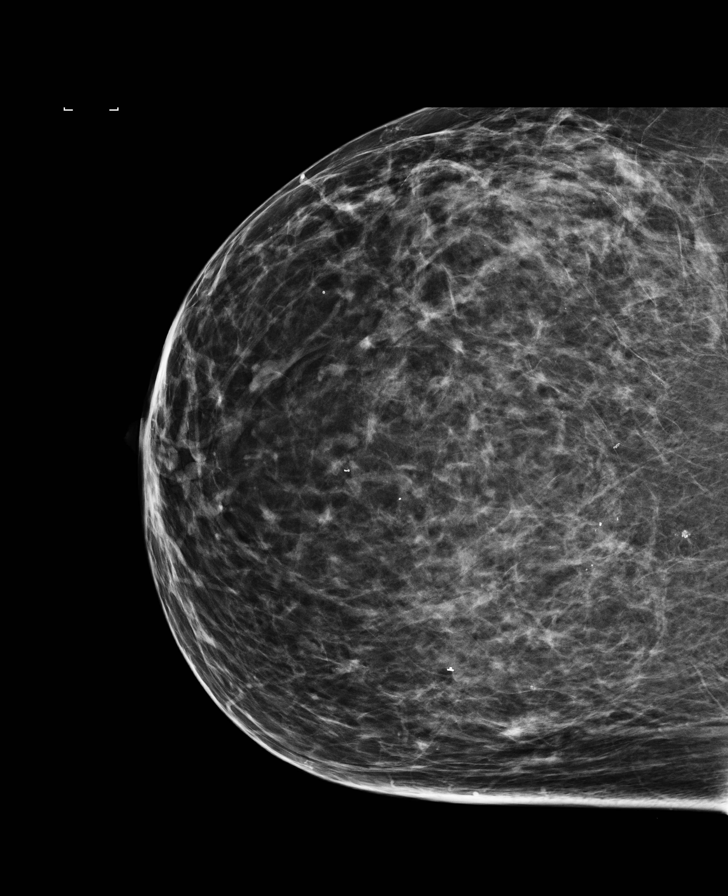

[R CC synth-2D]
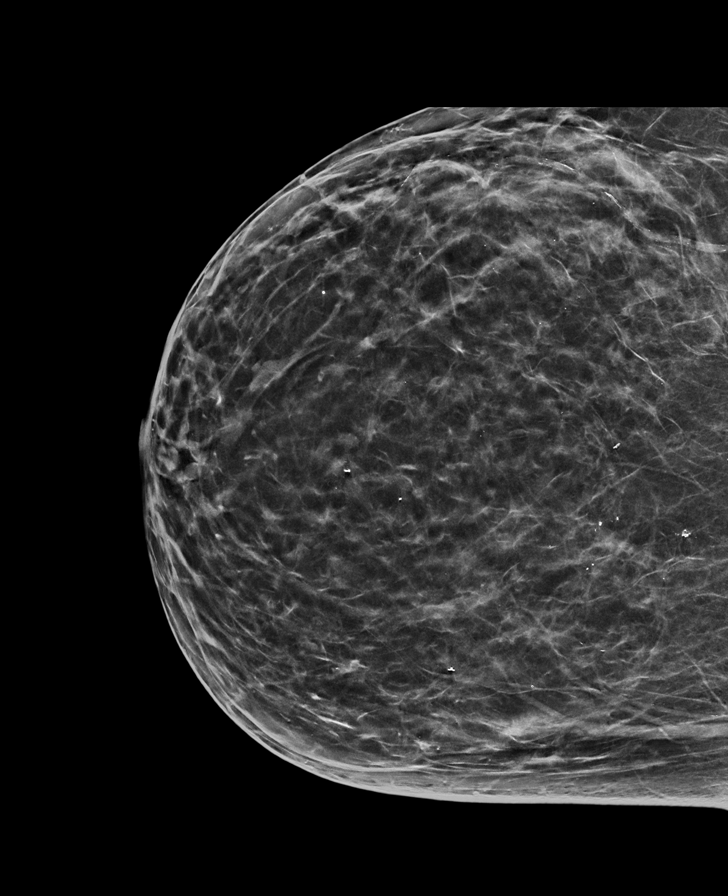

[L MLO synth-2D]
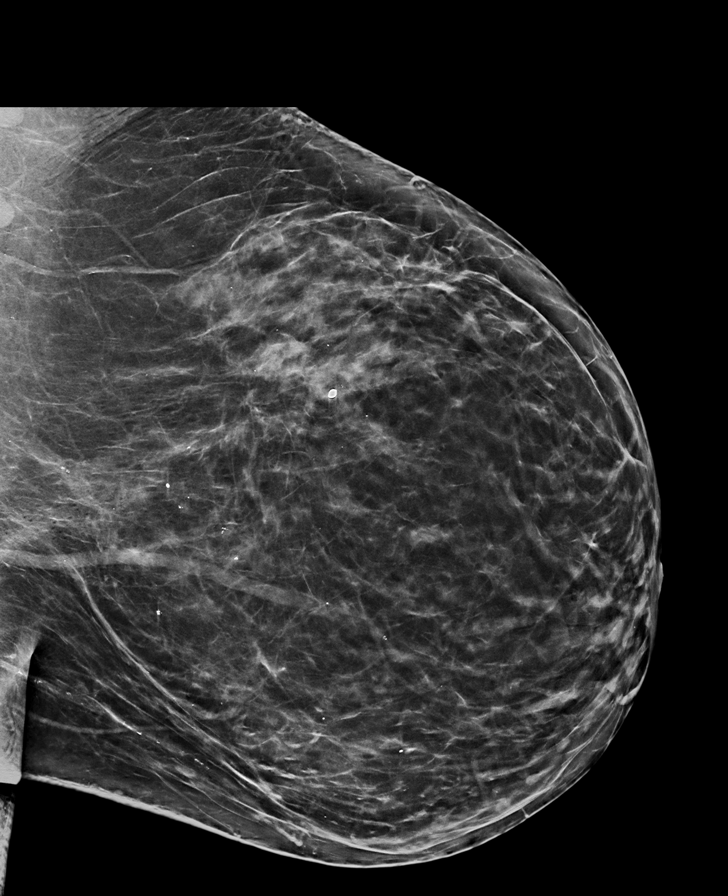

[R MLO]
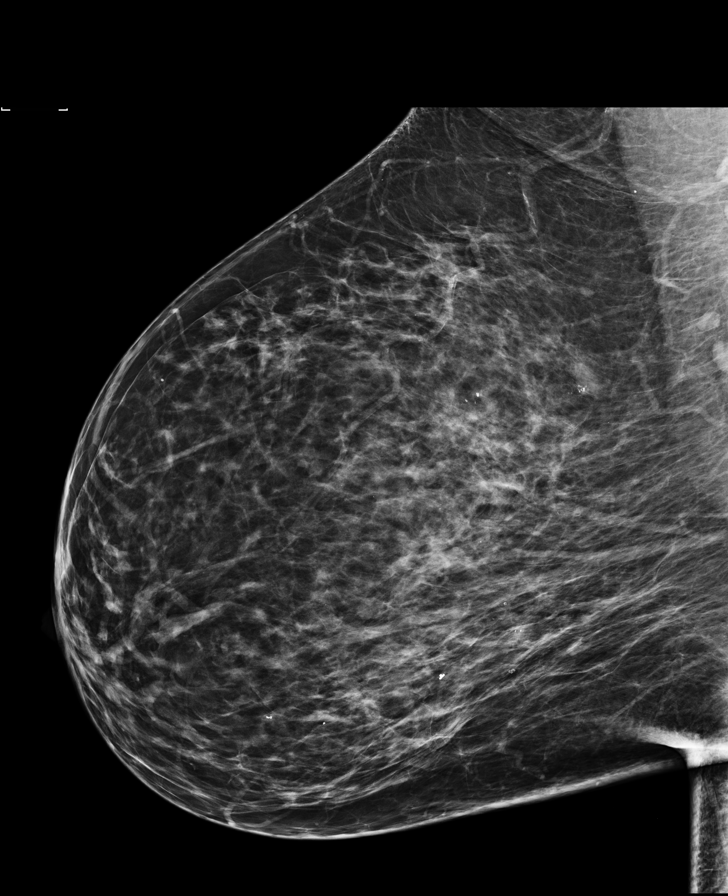

[8 of 28 positions shown; findings below may reference images not displayed]

ACR Breast Density Category c: The breast tissue is heterogeneously
dense, which may obscure small masses.
FINDINGS: There are no findings suspicious for malignancy. Images were
processed with CAD.
IMPRESSION: No mammographic evidence of malignancy. A result letter of this
screening mammogram will be mailed directly to the patient.

RECOMMENDATION:
Screening mammogram in one year. (Code:TN-[id number])

BI-RADS CATEGORY  1: Negative.

## 2019-04-26 ENCOUNTER — Other Ambulatory Visit: Payer: Self-pay | Admitting: Family Medicine

## 2019-04-26 DIAGNOSIS — Z1231 Encounter for screening mammogram for malignant neoplasm of breast: Secondary | ICD-10-CM

## 2019-05-01 ENCOUNTER — Encounter: Payer: Self-pay | Admitting: Family Medicine

## 2019-05-01 ENCOUNTER — Ambulatory Visit
Admission: RE | Admit: 2019-05-01 | Discharge: 2019-05-01 | Disposition: A | Discharge status: Home / Self Care | Payer: Medicare Other | Source: Ambulatory Visit | Attending: Family Medicine | Admitting: Family Medicine

## 2019-05-01 ENCOUNTER — Ambulatory Visit (INDEPENDENT_AMBULATORY_CARE_PROVIDER_SITE_OTHER): Payer: Medicare Other | Admitting: Family Medicine

## 2019-05-01 ENCOUNTER — Other Ambulatory Visit: Payer: Self-pay

## 2019-05-01 ENCOUNTER — Ambulatory Visit
Admission: RE | Admit: 2019-05-01 | Discharge: 2019-05-01 | Disposition: A | Discharge status: Home / Self Care | Payer: Medicare Other | Attending: Family Medicine | Admitting: Family Medicine

## 2019-05-01 ENCOUNTER — Ambulatory Visit: Payer: Medicare Other | Admitting: Internal Medicine

## 2019-05-01 VITALS — BP 110/60 | HR 80 | Ht 63.0 in | Wt 189.0 lb

## 2019-05-01 DIAGNOSIS — M19019 Primary osteoarthritis, unspecified shoulder: Secondary | ICD-10-CM | POA: Diagnosis not present

## 2019-05-01 DIAGNOSIS — M778 Other enthesopathies, not elsewhere classified: Secondary | ICD-10-CM | POA: Insufficient documentation

## 2019-05-01 DIAGNOSIS — I10 Essential (primary) hypertension: Secondary | ICD-10-CM

## 2019-05-01 DIAGNOSIS — M25531 Pain in right wrist: Secondary | ICD-10-CM | POA: Diagnosis not present

## 2019-05-01 MED ORDER — MELOXICAM 7.5 MG PO TABS: 7.5000 mg | ORAL_TABLET | Freq: Every day | ORAL | 1 refills | Status: DC

## 2019-05-01 MED ORDER — LOSARTAN POTASSIUM 25 MG PO TABS: 25.0000 mg | ORAL_TABLET | Freq: Every day | ORAL | 1 refills | Status: DC

## 2019-05-01 MED ORDER — TRAMADOL HCL 50 MG PO TABS: 50.0000 mg | ORAL_TABLET | Freq: Two times a day (BID) | ORAL | 0 refills | Status: AC | PRN

## 2019-05-01 NOTE — Progress Notes (Signed)
Date:  05/01/2019   Name:  Lindsey George   DOB:  [birth year]   MRN:  [id number]   Chief Complaint: Hand Pain (R) hand around thumb and up into the wrist/ swollen some) and Follow-up (losartan- better with cough on losartan)  Hand Pain   The incident occurred 2 days ago (onset of pain). Incident location: no incident. There was no injury mechanism. Pain location: right  thumb. Quality: throbbing. The pain radiates to the right arm. The pain is at a severity of 7/10. The pain is moderate. The pain has been constant since the incident. Pertinent negatives include no chest pain, muscle weakness, numbness or tingling. Nothing aggravates the symptoms. She has tried nothing (thought I could bare the pain) for the symptoms. The treatment provided mild relief.    Review of Systems  Constitutional: Negative for chills and fever.  HENT: Negative for drooling, ear discharge, ear pain and sore throat.   Respiratory: Negative for cough, shortness of breath and wheezing.   Cardiovascular: Negative for chest pain, palpitations and leg swelling.  Gastrointestinal: Negative for abdominal pain, blood in stool, constipation, diarrhea and nausea.  Endocrine: Negative for polydipsia.  Genitourinary: Negative for dysuria, frequency, hematuria and urgency.  Musculoskeletal: Negative for back pain, myalgias and neck pain.  Skin: Negative for rash.  Allergic/Immunologic: Negative for environmental allergies.  Neurological: Negative for dizziness, tingling, numbness and headaches.  Hematological: Does not bruise/bleed easily.  Psychiatric/Behavioral: Negative for suicidal ideas. The patient is not nervous/anxious.     Patient Active Problem List   Diagnosis Date Noted  . Morbid obesity ([place]) 03/29/2019  . Allergic rhinitis 01/19/2018  . Hx of adenomatous colonic polyps 01/14/2018  . Reactive airway disease without complication 18/56/3149  . Back ache 05/22/2015  . Alimentary obesity 05/22/2015  . Type  2 diabetes mellitus with complication, without long-term current use of insulin ([place]) 04/12/2014  . Hypercholesterolemia 04/12/2014  . OP (osteoporosis) 04/12/2014    Allergies  Allergen Reactions  . Codeine Other (See Comments)    Past Surgical History:  Procedure Laterality Date  . ABDOMINAL HYSTERECTOMY    . BREAST BIOPSY Right 06/22/11   bx/clip-neg  . BREAST CYST ASPIRATION Left    neg  . COLONOSCOPY  2014   cleared for 5 yrs- [place] doc  . COLONOSCOPY WITH PROPOFOL N/A 03/25/2018   Procedure: COLONOSCOPY WITH PROPOFOL;  Surgeon: Manya Silvas, MD;  Location: [facility] ENDOSCOPY;  Service: Endoscopy;  Laterality: N/A;  . PARTIAL HYSTERECTOMY    . SPLENECTOMY      Social History   Tobacco Use  . Smoking status: Never Smoker  . Smokeless tobacco: Never Used  . Tobacco comment: smoking cessation materials not required  Substance Use Topics  . Alcohol use: No    Alcohol/week: 0.0 standard drinks  . Drug use: No     Medication list has been reviewed and updated.  Current Meds  Medication Sig  . albuterol (PROVENTIL HFA;VENTOLIN HFA) 108 (90 Base) MCG/ACT inhaler Inhale 2 puffs into the lungs every 6 (six) hours as needed for wheezing or shortness of breath.  Marland Kitchen albuterol (PROVENTIL) (2.5 MG/3ML) 0.083% nebulizer solution Take 3 mLs (2.5 mg total) by nebulization every 6 (six) hours as needed for wheezing or shortness of breath.  Marland Kitchen alendronate (FOSAMAX) 70 MG tablet Take 1 tablet (70 mg total) by mouth every 7 (seven) days. Take with a full glass of water on an empty stomach.  Marland Kitchen aspirin 81 MG tablet Take 81 mg  by mouth daily.  . calcium-vitamin D (OSCAL WITH D) 500-200 MG-UNIT tablet Take 1 tablet by mouth.  . EPIPEN 2-PAK 0.3 MG/0.3ML SOAJ injection   . glipiZIDE (GLIPIZIDE XL) 5 MG 24 hr tablet Take 1 tablet (5 mg total) by mouth daily.  Marland Kitchen loratadine (CLARITIN) 10 MG tablet Take 10 mg by mouth daily. otc  . losartan (COZAAR) 25 MG tablet Take 1 tablet (25 mg total) by mouth  daily.  . meloxicam (MOBIC) 7.5 MG tablet Take 1 tablet (7.5 mg total) by mouth daily.  . metFORMIN (GLUCOPHAGE) 500 MG tablet TAKE ONE TABLET BY MOUTH TWICE DAILY  . Multiple Vitamin (MULTIVITAMIN) capsule Take 1 capsule by mouth daily.  . pioglitazone (ACTOS) 15 MG tablet Take 1 tablet (15 mg total) by mouth daily.  . simvastatin (ZOCOR) 40 MG tablet TAKE ONE TABLET BY MOUTH ONCE DAILY   Current Facility-Administered Medications for the 05/01/19 encounter (Office Visit) with Juline Patch, MD  Medication  . albuterol (PROVENTIL) (2.5 MG/3ML) 0.083% nebulizer solution 2.5 mg  . ipratropium-albuterol (DUONEB) 0.5-2.5 (3) MG/3ML nebulizer solution 3 mL    PHQ 2/9 Scores 03/29/2019 06/01/2018 02/24/2018 08/05/2017  PHQ - 2 Score 2 0 0 1  PHQ- 9 Score 4 0 - 2    BP Readings from Last 3 Encounters:  05/01/19 110/60  03/29/19 122/62  02/06/19 138/62    Physical Exam Vitals signs and nursing note reviewed.  Constitutional:      General: She is not in acute distress.    Appearance: She is not diaphoretic.  HENT:     Head: Normocephalic and atraumatic.     Right Ear: External ear normal.     Left Ear: External ear normal.     Nose: Nose normal.  Eyes:     General:        Right eye: No discharge.        Left eye: No discharge.     Conjunctiva/sclera: Conjunctivae normal.     Pupils: Pupils are equal, round, and reactive to light.  Neck:     Musculoskeletal: Normal range of motion and neck supple.     Thyroid: No thyromegaly.     Vascular: No JVD.  Cardiovascular:     Rate and Rhythm: Normal rate and regular rhythm.     Heart sounds: Normal heart sounds. No murmur. No friction rub. No gallop.   Pulmonary:     Effort: Pulmonary effort is normal.     Breath sounds: Normal breath sounds.  Abdominal:     General: Bowel sounds are normal.     Palpations: Abdomen is soft. There is no mass.     Tenderness: There is no abdominal tenderness. There is no guarding.  Musculoskeletal:      Right wrist: She exhibits decreased range of motion and tenderness. She exhibits no bony tenderness, no swelling, no effusion, no crepitus and no deformity.  Lymphadenopathy:     Cervical: No cervical adenopathy.  Skin:    General: Skin is warm and dry.  Neurological:     Mental Status: She is alert.     Deep Tendon Reflexes: Reflexes are normal and symmetric.     Wt Readings from Last 3 Encounters:  05/01/19 189 lb (85.7 kg)  03/29/19 189 lb (85.7 kg)  02/06/19 189 lb (85.7 kg)    BP 110/60   Pulse 80   Ht 5\' 3"  (1.6 m)   Wt 189 lb (85.7 kg)   BMI 33.48 kg/m  Assessment and Plan:  1. Tendonitis of wrist, right Acute.  Never currently painful to motion.  Pain is and the right wrist will initiate meloxicam 7.5 mg once a day. - DG Wrist Complete Right; Future - meloxicam (MOBIC) 7.5 MG tablet; Take 1 tablet (7.5 mg total) by mouth daily.  Dispense: 30 tablet; Refill: 1  2. Essential hypertension Patient was recently switched over to losartan because of a persistent cough patient was put on losartan 25 mg once a day and blood pressure recheck today was 110/60.  Will continue losartan once a day. - losartan (COZAAR) 25 MG tablet; Take 1 tablet (25 mg total) by mouth daily.  Dispense: 90 tablet; Refill: 1 - DG Wrist Complete Right; Future  3. Shoulder arthritis Patient has had a history of shoulder arthritis which she is home meloxicam in the past this is doing well but we will resume the meloxicam is that she has tolerated this - meloxicam (MOBIC) 7.5 MG tablet; Take 1 tablet (7.5 mg total) by mouth daily.  Dispense: 30 tablet; Refill: 1  4. Wrist pain, acute, right New onset.  Uncontrolled pain.  Patient had a fall on outstretched hand when she "slipped out of bed "and landed on an outstretched wrist.  Patient is tender over the navicular and we will obtain the the wrist x-ray and to rule out any fracture of the wrist and also look at the navicular to see if there is a possible  fracture there in the meantime we will put him in a cock-up splint and initiate meloxicam and tramadol to be taken at night meloxicam in the morning. - meloxicam (MOBIC) 7.5 MG tablet; Take 1 tablet (7.5 mg total) by mouth daily.  Dispense: 30 tablet; Refill: 1 - traMADol (ULTRAM) 50 MG tablet; Take 1 tablet (50 mg total) by mouth every 12 (twelve) hours as needed for up to 5 days.  Dispense: 10 tablet; Refill: 0

## 2019-05-02 ENCOUNTER — Ambulatory Visit: Payer: Medicare Other | Admitting: Family Medicine

## 2019-05-09 ENCOUNTER — Other Ambulatory Visit: Payer: Self-pay

## 2019-05-09 ENCOUNTER — Ambulatory Visit: Payer: Medicare Other | Admitting: Internal Medicine

## 2019-05-09 ENCOUNTER — Encounter: Payer: Self-pay | Admitting: Internal Medicine

## 2019-05-09 VITALS — BP 131/81 | HR 80 | Resp 16 | Ht 63.0 in | Wt 189.0 lb

## 2019-05-09 DIAGNOSIS — J4520 Mild intermittent asthma, uncomplicated: Secondary | ICD-10-CM

## 2019-05-09 DIAGNOSIS — R0602 Shortness of breath: Secondary | ICD-10-CM | POA: Diagnosis not present

## 2019-05-09 DIAGNOSIS — J301 Allergic rhinitis due to pollen: Secondary | ICD-10-CM | POA: Diagnosis not present

## 2019-05-09 NOTE — Progress Notes (Signed)
[facility] [place], [place] [address]  Pulmonary Sleep Medicine   Office Visit Note  Patient Name: Lindsey George DOB: November 02, 1951 MRN [id number]  Date of Service: 05/09/2019  Complaints/HPI: She is doing well overall> no SOB noted and no exposure history. Patient has no cough. Arlyce Harman looks good today. Patient has no fever or chills. She is on inhalers as needed. No chest pain. She is on allergy shots bu thas not taken since the last 2 months. She feels fine does not feel like her allergies are any worse  ROS  General: (-) fever, (-) chills, (-) night sweats, (-) weakness Skin: (-) rashes, (-) itching,. Eyes: (-) visual changes, (-) redness, (-) itching. Nose and Sinuses: (-) nasal stuffiness or itchiness, (-) postnasal drip, (-) nosebleeds, (-) sinus trouble. Mouth and Throat: (-) sore throat, (-) hoarseness. Neck: (-) swollen glands, (-) enlarged thyroid, (-) neck pain. Respiratory: - cough, (-) bloody sputum, - shortness of breath, - wheezing. Cardiovascular: - ankle swelling, (-) chest pain. Lymphatic: (-) lymph node enlargement. Neurologic: (-) numbness, (-) tingling. Psychiatric: (-) anxiety, (-) depression   Current Medication: Outpatient Encounter Medications as of 05/09/2019  Medication Sig Note  . albuterol (PROVENTIL HFA;VENTOLIN HFA) 108 (90 Base) MCG/ACT inhaler Inhale 2 puffs into the lungs every 6 (six) hours as needed for wheezing or shortness of breath.   Marland Kitchen albuterol (PROVENTIL) (2.5 MG/3ML) 0.083% nebulizer solution Take 3 mLs (2.5 mg total) by nebulization every 6 (six) hours as needed for wheezing or shortness of breath.   Marland Kitchen alendronate (FOSAMAX) 70 MG tablet Take 1 tablet (70 mg total) by mouth every 7 (seven) days. Take with a full glass of water on an empty stomach.   Marland Kitchen aspirin 81 MG tablet Take 81 mg by mouth daily.   . calcium-vitamin D (OSCAL WITH D) 500-200 MG-UNIT tablet Take 1 tablet by mouth.   . EPIPEN 2-PAK 0.3 MG/0.3ML  SOAJ injection  03/20/2016: Received from: External Pharmacy  . glipiZIDE (GLIPIZIDE XL) 5 MG 24 hr tablet Take 1 tablet (5 mg total) by mouth daily.   Marland Kitchen loratadine (CLARITIN) 10 MG tablet Take 10 mg by mouth daily. otc   . losartan (COZAAR) 25 MG tablet Take 1 tablet (25 mg total) by mouth daily.   . meloxicam (MOBIC) 7.5 MG tablet Take 1 tablet (7.5 mg total) by mouth daily.   . metFORMIN (GLUCOPHAGE) 500 MG tablet TAKE ONE TABLET BY MOUTH TWICE DAILY   . Multiple Vitamin (MULTIVITAMIN) capsule Take 1 capsule by mouth daily.   . pioglitazone (ACTOS) 15 MG tablet Take 1 tablet (15 mg total) by mouth daily.   . simvastatin (ZOCOR) 40 MG tablet TAKE ONE TABLET BY MOUTH ONCE DAILY    Facility-Administered Encounter Medications as of 05/09/2019  Medication  . albuterol (PROVENTIL) (2.5 MG/3ML) 0.083% nebulizer solution 2.5 mg  . ipratropium-albuterol (DUONEB) 0.5-2.5 (3) MG/3ML nebulizer solution 3 mL    Surgical History: Past Surgical History:  Procedure Laterality Date  . ABDOMINAL HYSTERECTOMY    . BREAST BIOPSY Right 06/22/11   bx/clip-neg  . BREAST CYST ASPIRATION Left    neg  . COLONOSCOPY  2014   cleared for 5 yrs- [place] doc  . COLONOSCOPY WITH PROPOFOL N/A 03/25/2018   Procedure: COLONOSCOPY WITH PROPOFOL;  Surgeon: Manya Silvas, MD;  Location: [facility] ENDOSCOPY;  Service: Endoscopy;  Laterality: N/A;  . PARTIAL HYSTERECTOMY    . SPLENECTOMY      Medical History: Past Medical History:  Diagnosis Date  . Asthma   . Cancer ([place])    skin ca  . Chronic back pain   . Diabetes mellitus without complication ([place])   . Hyperlipidemia   . Hypertension   . Obesity   . Osteoporosis     Family History: Family History  Problem Relation Age of Onset  . Breast cancer Sister 13  . Cancer Sister   . Heart disease Sister   . Cancer Father   . Stroke Father   . Heart disease Sister     Social History: Social History   Socioeconomic History  . Marital status: Married    Spouse  name: Not on file  . Number of children: 2  . Years of education: Not on file  . Highest education level: Associate degree: academic program  Occupational History  . Occupation: Retired  Scientific laboratory technician  . Financial resource strain: Not hard at all  . Food insecurity:    Worry: Never true    Inability: Never true  . Transportation needs:    Medical: No    Non-medical: No  Tobacco Use  . Smoking status: Never Smoker  . Smokeless tobacco: Never Used  . Tobacco comment: smoking cessation materials not required  Substance and Sexual Activity  . Alcohol use: No    Alcohol/week: 0.0 standard drinks  . Drug use: No  . Sexual activity: Yes  Lifestyle  . Physical activity:    Days per week: 0 days    Minutes per session: 0 min  . Stress: Not at all  Relationships  . Social connections:    Talks on phone: Patient refused    Gets together: Patient refused    Attends religious service: Patient refused    Active member of club or organization: Patient refused    Attends meetings of clubs or organizations: Patient refused    Relationship status: Married  . Intimate partner violence:    Fear of current or ex partner: No    Emotionally abused: No    Physically abused: No    Forced sexual activity: No  Other Topics Concern  . Not on file  Social History Narrative  . Not on file    Vital Signs: Blood pressure 131/81, pulse 80, resp. rate 16, height 5\' 3"  (1.6 m), weight 189 lb (85.7 kg), SpO2 96 %.  Examination: General Appearance: The patient is well-developed, well-nourished, and in no distress. Skin: Gross inspection of skin unremarkable. Head: normocephalic, no gross deformities. Eyes: no gross deformities noted. ENT: ears appear grossly normal no exudates. Neck: Supple. No thyromegaly. No LAD. Respiratory: no rhonchi noted at this time. Cardiovascular: Normal S1 and S2 without murmur or rub. Extremities: No cyanosis. pulses are equal. Neurologic: Alert and oriented. No  involuntary movements.  LABS: Recent Results (from the past 2160 hour(s))  Microalbumin, urine     Status: None   Collection Time: 03/29/19  9:27 AM  Result Value Ref Range   Microalbumin, Urine 175.3 Not Estab. ug/mL  HgB A1c     Status: Abnormal   Collection Time: 03/29/19  9:27 AM  Result Value Ref Range   Hgb A1c MFr Bld 6.7 (H) 4.8 - 5.6 %    Comment:          Prediabetes: 5.7 - 6.4          Diabetes: >6.4          Glycemic control for adults with diabetes: <7.0    Est. average glucose Bld gHb Est-mCnc  146 mg/dL  Lipid Panel With LDL/HDL Ratio     Status: None   Collection Time: 03/29/19  9:27 AM  Result Value Ref Range   Cholesterol, Total 130 100 - 199 mg/dL   Triglycerides 104 0 - 149 mg/dL   HDL 49 >39 mg/dL   VLDL Cholesterol Cal 21 5 - 40 mg/dL   LDL Calculated 60 0 - 99 mg/dL   LDl/HDL Ratio 1.2 0.0 - 3.2 ratio    Comment:                                     LDL/HDL Ratio                                             Men  Women                               1/2 Avg.Risk  1.0    1.5                                   Avg.Risk  3.6    3.2                                2X Avg.Risk  6.2    5.0                                3X Avg.Risk  8.0    6.1   Renal Function Panel     Status: Abnormal   Collection Time: 03/29/19  9:27 AM  Result Value Ref Range   Glucose 132 (H) 65 - 99 mg/dL   BUN 15 8 - 27 mg/dL   Creatinine, Ser 0.57 0.57 - 1.00 mg/dL   GFR calc non Af Amer 95 >59 mL/min/1.73   GFR calc Af Amer 109 >59 mL/min/1.73   BUN/Creatinine Ratio 26 12 - 28   Sodium 141 134 - 144 mmol/L   Potassium 4.6 3.5 - 5.2 mmol/L   Chloride 98 96 - 106 mmol/L   CO2 23 20 - 29 mmol/L   Calcium 10.9 (H) 8.7 - 10.3 mg/dL   Phosphorus 4.5 (H) 3.0 - 4.3 mg/dL   Albumin 4.9 (H) 3.8 - 4.8 g/dL    Radiology: Dg Wrist Complete Right  Result Date: 05/01/2019 CLINICAL DATA:  Right wrist pain after fall 2 weeks ago. EXAM: RIGHT WRIST - COMPLETE 3+ VIEW COMPARISON:  None. FINDINGS:  There is no evidence of fracture or dislocation. There is no evidence of arthropathy or other focal bone abnormality. Soft tissues are unremarkable. IMPRESSION: Negative. Electronically Signed   By: Marijo Conception M.D.   On: 05/01/2019 15:11    No results found.  Dg Wrist Complete Right  Result Date: 05/01/2019 CLINICAL DATA:  Right wrist pain after fall 2 weeks ago. EXAM: RIGHT WRIST - COMPLETE 3+ VIEW COMPARISON:  None. FINDINGS: There is no evidence of fracture or dislocation. There is no evidence of arthropathy or other focal bone abnormality. Soft tissues are unremarkable. IMPRESSION: Negative. Electronically Signed  By: Marijo Conception M.D.   On: 05/01/2019 15:11      Assessment and Plan: Patient Active Problem List   Diagnosis Date Noted  . Morbid obesity ([place]) 03/29/2019  . Allergic rhinitis 01/19/2018  . Hx of adenomatous colonic polyps 01/14/2018  . Reactive airway disease without complication 45/36/4680  . Back ache 05/22/2015  . Alimentary obesity 05/22/2015  . Type 2 diabetes mellitus with complication, without long-term current use of insulin ([place]) 04/12/2014  . Hypercholesterolemia 04/12/2014  . OP (osteoporosis) 04/12/2014    1. Asthma chronic well controlled at this time. Patient spirometry looks excellent. Will continue with inhalers as needed and monitor 2. Allergic Rhinitis she is off allergy shots and feels fine. She was supposed to continue until June or December but has no increase in symptoms so ok to stop 3. Morbid obesity dietary managemnt  General Counseling: I have discussed the findings of the evaluation and examination with Marlowe Kays.  I have also discussed any further diagnostic evaluation thatmay be needed or ordered today. Rily verbalizes understanding of the findings of todays visit. We also reviewed her medications today and discussed drug interactions and side effects including but not limited excessive drowsiness and altered mental states. We also  discussed that there is always a risk not just to her but also people around her. she has been encouraged to call the office with any questions or concerns that should arise related to todays visit.    Time spent: 5min  I have personally obtained a history, examined the patient, evaluated laboratory and imaging results, formulated the assessment and plan and placed orders.    Allyne Gee, MD [facility] Pulmonary and Critical Care Sleep medicine

## 2019-05-09 NOTE — Patient Instructions (Signed)
Asthma, Adult    Asthma is a long-term (chronic) condition in which the airways get tight and narrow. The airways are the breathing passages that lead from the nose and mouth down into the lungs. A person with asthma will have times when symptoms get worse. These are called asthma attacks. They can cause coughing, whistling sounds when you breathe (wheezing), shortness of breath, and chest pain. They can make it hard to breathe. There is no cure for asthma, but medicines and lifestyle changes can help control it.  There are many things that can bring on an asthma attack or make asthma symptoms worse (triggers). Common triggers include:  · Mold.  · Dust.  · Cigarette smoke.  · Cockroaches.  · Things that can cause allergy symptoms (allergens). These include animal skin flakes (dander) and pollen from trees or grass.  · Things that pollute the air. These may include household cleaners, wood smoke, smog, or chemical odors.  · Cold air, weather changes, and wind.  · Crying or laughing hard.  · Stress.  · Certain medicines or drugs.  · Certain foods such as dried fruit, potato chips, and grape juice.  · Infections, such as a cold or the flu.  · Certain medical conditions or diseases.  · Exercise or tiring activities.  Asthma may be treated with medicines and by staying away from the things that cause asthma attacks. Types of medicines may include:  · Controller medicines. These help prevent asthma symptoms. They are usually taken every day.  · Fast-acting reliever or rescue medicines. These quickly relieve asthma symptoms. They are used as needed and provide short-term relief.  · Allergy medicines if your attacks are brought on by allergens.  · Medicines to help control the body's defense (immune) system.  Follow these instructions at home:  Avoiding triggers in your home  · Change your heating and air conditioning filter often.  · Limit your use of fireplaces and wood stoves.  · Get rid of pests (such as roaches and  mice) and their droppings.  · Throw away plants if you see mold on them.  · Clean your floors. Dust regularly. Use cleaning products that do not smell.  · Have someone vacuum when you are not home. Use a vacuum cleaner with a HEPA filter if possible.  · Replace carpet with wood, tile, or vinyl flooring. Carpet can trap animal skin flakes and dust.  · Use allergy-proof pillows, mattress covers, and box spring covers.  · Wash bed sheets and blankets every week in hot water. Dry them in a dryer.  · Keep your bedroom free of any triggers.   · Avoid pets and keep windows closed when things that cause allergy symptoms are in the air.  · Use blankets that are made of polyester or cotton.  · Clean bathrooms and kitchens with bleach. If possible, have someone repaint the walls in these rooms with mold-resistant paint. Keep out of the rooms that are being cleaned and painted.  · Wash your hands often with soap and water. If soap and water are not available, use hand sanitizer.  · Do not allow anyone to smoke in your home.  General instructions  · Take over-the-counter and prescription medicines only as told by your doctor.  ? Talk with your doctor if you have questions about how or when to take your medicines.  ? Make note if you need to use your medicines more often than usual.  · Do not use any products that   contain nicotine or tobacco, such as cigarettes and e-cigarettes. If you need help quitting, ask your doctor.  · Stay away from secondhand smoke.  · Avoid doing things outdoors when allergen counts are high and when air quality is low.  · Wear a ski mask when doing outdoor activities in the winter. The mask should cover your nose and mouth. Exercise indoors on cold days if you can.  · Warm up before you exercise. Take time to cool down after exercise.  · Use a peak flow meter as told by your doctor. A peak flow meter is a tool that measures how well the lungs are working.  · Keep track of the peak flow meter's readings.  Write them down.  · Follow your asthma action plan. This is a written plan for taking care of your asthma and treating your attacks.  · Make sure you get all the shots (vaccines) that your doctor recommends. Ask your doctor about a flu shot and a pneumonia shot.  · Keep all follow-up visits as told by your doctor. This is important.  Contact a doctor if:  · You have wheezing, shortness of breath, or a cough even while taking medicine to prevent attacks.  · The mucus you cough up (sputum) is thicker than usual.  · The mucus you cough up changes from clear or white to yellow, green, gray, or bloody.  · You have problems from the medicine you are taking, such as:  ? A rash.  ? Itching.  ? Swelling.  ? Trouble breathing.  · You need reliever medicines more than 2-3 times a week.  · Your peak flow reading is still at 50-79% of your personal best after following the action plan for 1 hour.  · You have a fever.  Get help right away if:  · You seem to be worse and are not responding to medicine during an asthma attack.  · You are short of breath even at rest.  · You get short of breath when doing very little activity.  · You have trouble eating, drinking, or talking.  · You have chest pain or tightness.  · You have a fast heartbeat.  · Your lips or fingernails start to turn blue.  · You are light-headed or dizzy, or you faint.  · Your peak flow is less than 50% of your personal best.  · You feel too tired to breathe normally.  Summary  · Asthma is a long-term (chronic) condition in which the airways get tight and narrow. An asthma attack can make it hard to breathe.  · Asthma cannot be cured, but medicines and lifestyle changes can help control it.  · Make sure you understand how to avoid triggers and how and when to use your medicines.  This information is not intended to replace advice given to you by your health care provider. Make sure you discuss any questions you have with your health care provider.  Document  Released: 05/04/2008 Document Revised: 12/21/2016 Document Reviewed: 12/21/2016  [employer] Interactive Patient Education © 2019 [employer]

## 2019-05-20 ENCOUNTER — Other Ambulatory Visit: Payer: Self-pay | Admitting: Family Medicine

## 2019-06-05 ENCOUNTER — Ambulatory Visit (INDEPENDENT_AMBULATORY_CARE_PROVIDER_SITE_OTHER): Payer: Medicare Other

## 2019-06-05 ENCOUNTER — Other Ambulatory Visit: Payer: Self-pay

## 2019-06-05 VITALS — BP 122/72 | HR 80 | Temp 98.4°F | Resp 16 | Ht 63.0 in | Wt 188.0 lb

## 2019-06-05 DIAGNOSIS — Z0000 Encounter for general adult medical examination without abnormal findings: Secondary | ICD-10-CM | POA: Diagnosis not present

## 2019-06-05 NOTE — Progress Notes (Signed)
Subjective:   Lindsey George is a 68 y.o. female who presents for Medicare Annual (Subsequent) preventive examination.   Review of Systems:   Cardiac Risk Factors include: advanced age (>29men, >19 women);diabetes mellitus;hypertension;dyslipidemia;obesity (BMI >30kg/m2)     Objective:     Vitals: BP 122/72 (BP Location: Left Arm, Patient Position: Sitting, Cuff Size: Large)   Pulse 80   Temp 98.4 F (36.9 C) (Oral)   Resp 16   Ht 5\' 3"  (1.6 m)   Wt 188 lb (85.3 kg)   SpO2 95%   BMI 33.30 kg/m   Body mass index is 33.3 kg/m.  Advanced Directives 06/05/2019 06/01/2018 03/25/2018 08/30/2015  Does Patient Have a Medical Advance Directive? No No No No  Would patient like information on creating a medical advance directive? Yes (MAU/Ambulatory/Procedural Areas - Information given) Yes (MAU/Ambulatory/Procedural Areas - Information given) - No - patient declined information    Tobacco Social History   Tobacco Use  Smoking Status Never Smoker  Smokeless Tobacco Never Used  Tobacco Comment   smoking cessation materials not required     Counseling given: Not Answered Comment: smoking cessation materials not required   Clinical Intake:  Pre-visit preparation completed: Yes  Pain : No/denies pain     BMI - recorded: 33.3 Nutritional Status: BMI > 30  Obese Nutritional Risks: None Diabetes: Yes CBG done?: No Did pt. bring in CBG monitor from home?: No   Nutrition Risk Assessment:  Has the patient had any N/V/D within the last 2 months?  No  Does the patient have any non-healing wounds?  No  Has the patient had any unintentional weight loss or weight gain?  No   Diabetes:  Is the patient diabetic?  Yes  If diabetic, was a CBG obtained today?  No  Did the patient bring in their glucometer from home?  No  How often do you monitor your CBG's? Once a week.   Financial Strains and Diabetes Management:  Are you having any financial strains with the device, your  supplies or your medication? No .  Does the patient want to be seen by Chronic Care Management for management of their diabetes?  No  Would the patient like to be referred to a Nutritionist or for Diabetic Management?  No   Diabetic Exams:  Diabetic Eye Exam: Completed 11/11/18 negative retinopathy.   Diabetic Foot Exam: Completed 03/29/19.   How often do you need to have someone help you when you read instructions, pamphlets, or other written materials from your doctor or pharmacy?: 1 - Never  Interpreter Needed?: No  Information entered by :: Clemetine Marker LPN  Past Medical History:  Diagnosis Date  . Asthma   . Cancer ([place])    skin ca  . Chronic back pain   . Diabetes mellitus without complication ([place])   . Hyperlipidemia   . Hypertension   . Obesity   . Osteoporosis    Past Surgical History:  Procedure Laterality Date  . ABDOMINAL HYSTERECTOMY    . BREAST BIOPSY Right 06/22/11   bx/clip-neg  . BREAST CYST ASPIRATION Left    neg  . COLONOSCOPY  2014   cleared for 5 yrs- [facility] doc  . COLONOSCOPY WITH PROPOFOL N/A 03/25/2018   Procedure: COLONOSCOPY WITH PROPOFOL;  Surgeon: Manya Silvas, MD;  Location: [facility] ENDOSCOPY;  Service: Endoscopy;  Laterality: N/A;  . PARTIAL HYSTERECTOMY    . SPLENECTOMY     Family History  Problem Relation Age of Onset  .  Breast cancer Sister 38  . Cancer Sister   . Heart disease Sister   . Cancer Father   . Stroke Father   . Heart disease Sister    Social History   Socioeconomic History  . Marital status: Married    Spouse name: Not on file  . Number of children: 2  . Years of education: Not on file  . Highest education level: Associate degree: academic program  Occupational History  . Occupation: Retired  Scientific laboratory technician  . Financial resource strain: Not hard at all  . Food insecurity    Worry: Never true    Inability: Never true  . Transportation needs    Medical: No    Non-medical: No  Tobacco Use  . Smoking status: Never  Smoker  . Smokeless tobacco: Never Used  . Tobacco comment: smoking cessation materials not required  Substance and Sexual Activity  . Alcohol use: No    Alcohol/week: 0.0 standard drinks  . Drug use: No  . Sexual activity: Yes  Lifestyle  . Physical activity    Days per week: 0 days    Minutes per session: 0 min  . Stress: Only a little  Relationships  . Social connections    Talks on phone: More than three times a week    Gets together: Three times a week    Attends religious service: More than 4 times per year    Active member of club or organization: No    Attends meetings of clubs or organizations: Never    Relationship status: Married  Other Topics Concern  . Not on file  Social History Narrative  . Not on file    Outpatient Encounter Medications as of 06/05/2019  Medication Sig  . albuterol (PROVENTIL HFA;VENTOLIN HFA) 108 (90 Base) MCG/ACT inhaler Inhale 2 puffs into the lungs every 6 (six) hours as needed for wheezing or shortness of breath.  Marland Kitchen alendronate (FOSAMAX) 70 MG tablet Take 1 tablet (70 mg total) by mouth every 7 (seven) days. Take with a full glass of water on an empty stomach.  Marland Kitchen aspirin 81 MG tablet Take 81 mg by mouth daily.  . calcium-vitamin D (OSCAL WITH D) 500-200 MG-UNIT tablet Take 1 tablet by mouth.  . EPIPEN 2-PAK 0.3 MG/0.3ML SOAJ injection   . glipiZIDE (GLIPIZIDE XL) 5 MG 24 hr tablet Take 1 tablet (5 mg total) by mouth daily.  Marland Kitchen loratadine (CLARITIN) 10 MG tablet Take 10 mg by mouth daily. otc  . losartan (COZAAR) 25 MG tablet Take 1 tablet (25 mg total) by mouth daily.  . metFORMIN (GLUCOPHAGE) 500 MG tablet TAKE ONE TABLET BY MOUTH TWICE DAILY  . Multiple Vitamin (MULTIVITAMIN) capsule Take 1 capsule by mouth daily.  Glory Rosebush ULTRA test strip USE ONE STRIP TO CHECK GLUCOSE ONCE DAILY  . pioglitazone (ACTOS) 15 MG tablet Take 1 tablet (15 mg total) by mouth daily.  . simvastatin (ZOCOR) 40 MG tablet TAKE ONE TABLET BY MOUTH ONCE DAILY  .  albuterol (PROVENTIL) (2.5 MG/3ML) 0.083% nebulizer solution Take 3 mLs (2.5 mg total) by nebulization every 6 (six) hours as needed for wheezing or shortness of breath. (Patient not taking: Reported on 06/05/2019)  . [DISCONTINUED] meloxicam (MOBIC) 7.5 MG tablet Take 1 tablet (7.5 mg total) by mouth daily.   Facility-Administered Encounter Medications as of 06/05/2019  Medication  . albuterol (PROVENTIL) (2.5 MG/3ML) 0.083% nebulizer solution 2.5 mg  . ipratropium-albuterol (DUONEB) 0.5-2.5 (3) MG/3ML nebulizer solution 3 mL  Activities of Daily Living In your present state of health, do you have any difficulty performing the following activities: 06/05/2019  Hearing? N  Comment declines hearing aids  Vision? N  Difficulty concentrating or making decisions? N  Walking or climbing stairs? N  Dressing or bathing? N  Doing errands, shopping? N  Preparing Food and eating ? N  Using the Toilet? N  In the past six months, have you accidently leaked urine? N  Do you have problems with loss of bowel control? N  Managing your Medications? N  Managing your Finances? N  Housekeeping or managing your Housekeeping? N  Some recent data might be hidden    Patient Care Team: Juline Patch, MD as PCP - General (Family Medicine) Catheryn Bacon, CNM as Midwife (Obstetrics and Gynecology) Allyne Gee, MD as Consulting Physician (Internal Medicine)    Assessment:   This is a routine wellness examination for [place].  Exercise Activities and Dietary recommendations Current Exercise Habits: The patient does not participate in regular exercise at present, Exercise limited by: None identified  Goals    . DIET - INCREASE WATER INTAKE     Recommend to drink at least 6-8 8oz glasses of water per day.    . Increase physical activity     Pt would like to get back to walking more and exercising overall, unable to go to the gym right now due to covid-19       Fall Risk Fall Risk  06/05/2019  11/16/2018 06/01/2018 02/24/2018 08/05/2017  Falls in the past year? 1 0 No No No  Comment fell out of bed - - - -  Number falls in past yr: 1 - - - -  Injury with Fall? 1 - - - -  Risk for fall due to : History of fall(s) - Impaired vision - -  Risk for fall due to: Comment - - wears eyeglasses - -  Follow up Falls prevention discussed Falls evaluation completed - - -   FALL RISK PREVENTION PERTAINING TO THE HOME:  Any stairs in or around the home? Yes  If so, do they handrails? No  2 steps outside   Home free of loose throw rugs in walkways, pet beds, electrical cords, etc? Yes  Adequate lighting in your home to reduce risk of falls? Yes   ASSISTIVE DEVICES UTILIZED TO PREVENT FALLS:  Life alert? No  Use of a cane, walker or w/c? No  Grab bars in the bathroom? No  Shower chair or bench in shower? No  Elevated toilet seat or a handicapped toilet? No   DME ORDERS:  DME order needed?  No   TIMED UP AND GO:  Was the test performed? Yes .  Length of time to ambulate 10 feet: 5 sec.   GAIT:  Appearance of gait: Gait stead-fast and without the use of an assistive device.   Education: Fall risk prevention has been discussed.  Intervention(s) required? No    Depression Screen PHQ 2/9 Scores 06/05/2019 03/29/2019 06/01/2018 02/24/2018  PHQ - 2 Score 2 2 0 0  PHQ- 9 Score 3 4 0 -     Cognitive Function     6CIT Screen 06/05/2019 06/01/2018  What Year? 0 points 0 points  What month? 0 points 0 points  What time? 0 points 0 points  Count back from 20 0 points 0 points  Months in reverse 0 points 0 points  Repeat phrase 0 points 2 points  Total Score  0 2    Immunization History  Administered Date(s) Administered  . Influenza, High Dose Seasonal PF 08/05/2017, 09/19/2018  . Influenza,inj,Quad PF,6+ Mos 08/19/2016  . Pneumococcal Conjugate-13 09/03/2015  . Pneumococcal Polysaccharide-23 09/30/2009, 08/05/2017  . Td 07/31/2004  . Tdap 08/19/2016  . Zoster 07/20/2016     Qualifies for Shingles Vaccine? Yes  Zostavax completed 2017. Due for Shingrix. Education has been provided regarding the importance of this vaccine. Pt has been advised to call insurance company to determine out of pocket expense. Advised may also receive vaccine at local pharmacy or Health Dept. Verbalized acceptance and understanding.  Tdap: Up to date  Flu Vaccine: Up to date  Pneumococcal Vaccine: Up to date  Screening Tests Health Maintenance  Topic Date Due  . MAMMOGRAM  06/09/2019  . INFLUENZA VACCINE  07/01/2019  . HEMOGLOBIN A1C  09/28/2019  . OPHTHALMOLOGY EXAM  11/12/2019  . FOOT EXAM  03/28/2020  . COLONOSCOPY  03/26/2023  . TETANUS/TDAP  08/19/2026  . DEXA SCAN  Completed  . Hepatitis C Screening  Completed  . PNA vac Low Risk Adult  Completed    Cancer Screenings:  Colorectal Screening: Completed 03/25/18. Repeat every 5 years;   Mammogram: Completed 06/08/18. Repeat every year; Scheduled for 06/12/19  Bone Density: Completed 06/05/16. Results reflect  OSTEOPENIA. Repeat every 2 years. Pt currently taking fosamax, provider to advise on next recommended dexa scan.   Lung Cancer Screening: (Low Dose CT Chest recommended if Age 83-80 years, 30 pack-year currently smoking OR have quit w/in 15years.) does not qualify.    Additional Screening:  Hepatitis C Screening: does qualify; Completed 08/05/17  Vision Screening: Recommended annual ophthalmology exams for early detection of glaucoma and other disorders of the eye. Is the patient up to date with their annual eye exam?  Yes  Who is the provider or what is the name of the office in which the pt attends annual eye exams? [place] Screening: Recommended annual dental exams for proper oral hygiene  Community Resource Referral:  CRR required this visit?  No       Plan:     I have personally reviewed and addressed the Medicare Annual Wellness questionnaire and have noted the following in the  patient's chart:  A. Medical and social history B. Use of alcohol, tobacco or illicit drugs  C. Current medications and supplements D. Functional ability and status E.  Nutritional status F.  Physical activity G. Advance directives H. List of other physicians I.  Hospitalizations, surgeries, and ER visits in previous 12 months J.  [place] such as hearing and vision if needed, cognitive and depression L. Referrals and appointments   In addition, I have reviewed and discussed with patient certain preventive protocols, quality metrics, and best practice recommendations. A written personalized care plan for preventive services as well as general preventive health recommendations were provided to patient.   Signed,  Clemetine Marker, LPN Nurse Health Advisor   Nurse Notes: pt doing well and appreciative of visit today

## 2019-06-05 NOTE — Patient Instructions (Signed)
Lindsey George , Thank you for taking time to come for your Medicare Wellness Visit. I appreciate your ongoing commitment to your health goals. Please review the following plan we discussed and let me know if I can assist you in the future.   Screening recommendations/referrals: Colonoscopy: done 03/25/18. Repeat in 2024. Mammogram: done 06/08/18. Scheduled for 06/12/19 Bone Density: done 06/05/16 Recommended yearly ophthalmology/optometry visit for glaucoma screening and checkup Recommended yearly dental visit for hygiene and checkup  Vaccinations: Influenza vaccine: done 09/19/18 Pneumococcal vaccine: done 08/05/17 Tdap vaccine: done 08/19/16 Shingles vaccine: Shingrix discussed. Please contact your pharmacy for coverage information.   Advanced directives: Advance directive discussed with you today. I have provided a copy for you to complete at home and have notarized. Once this is complete please bring a copy in to our office so we can scan it into your chart.  Conditions/risks identified: recommend increasing physical activity to at least 150 minutes per week   Next appointment: Please follow up in one year for your Medicare Annual Wellness visit.     Preventive Care 69 Years and Older, Female Preventive care refers to lifestyle choices and visits with your health care provider that can promote health and wellness. What does preventive care include?  A yearly physical exam. This is also called an annual well check.  Dental exams once or twice a year.  Routine eye exams. Ask your health care provider how often you should have your eyes checked.  Personal lifestyle choices, including:  Daily care of your teeth and gums.  Regular physical activity.  Eating a healthy diet.  Avoiding tobacco and drug use.  Limiting alcohol use.  Practicing safe sex.  Taking low-dose aspirin every day.  Taking vitamin and mineral supplements as recommended by your health care provider. What  happens during an annual well check? The services and screenings done by your health care provider during your annual well check will depend on your age, overall health, lifestyle risk factors, and family history of disease. Counseling  Your health care provider may ask you questions about your:  Alcohol use.  Tobacco use.  Drug use.  Emotional well-being.  Home and relationship well-being.  Sexual activity.  Eating habits.  History of falls.  Memory and ability to understand (cognition).  Work and work Statistician.  Reproductive health. Screening  You may have the following tests or measurements:  Height, weight, and BMI.  Blood pressure.  Lipid and cholesterol levels. These may be checked every 5 years, or more frequently if you are over 2 years old.  Skin check.  Lung cancer screening. You may have this screening every year starting at age 69 if you have a 30-pack-year history of smoking and currently smoke or have quit within the past 15 years.  Fecal occult blood test (FOBT) of the stool. You may have this test every year starting at age 69.  Flexible sigmoidoscopy or colonoscopy. You may have a sigmoidoscopy every 5 years or a colonoscopy every 10 years starting at age 69.  Hepatitis C blood test.  Hepatitis B blood test.  Sexually transmitted disease (STD) testing.  Diabetes screening. This is done by checking your blood sugar (glucose) after you have not eaten for a while (fasting). You may have this done every 1-3 years.  Bone density scan. This is done to screen for osteoporosis. You may have this done starting at age 69.  Mammogram. This may be done every 1-2 years. Talk to your health care provider about  how often you should have regular mammograms. Talk with your health care provider about your test results, treatment options, and if necessary, the need for more tests. Vaccines  Your health care provider may recommend certain vaccines, such as:   Influenza vaccine. This is recommended every year.  Tetanus, diphtheria, and acellular pertussis (Tdap, Td) vaccine. You may need a Td booster every 10 years.  Zoster vaccine. You may need this after age 69.  Pneumococcal 13-valent conjugate (PCV13) vaccine. One dose is recommended after age 69.  Pneumococcal polysaccharide (PPSV23) vaccine. One dose is recommended after age 69. Talk to your health care provider about which screenings and vaccines you need and how often you need them. This information is not intended to replace advice given to you by your health care provider. Make sure you discuss any questions you have with your health care provider. Document Released: 12/13/2015 Document Revised: 08/05/2016 Document Reviewed: 09/17/2015 Elsevier Interactive Patient Education  2017 [place] Prevention in the Home Falls can cause injuries. They can happen to people of all ages. There are many things you can do to make your home safe and to help prevent falls. What can I do on the outside of my home?  Regularly fix the edges of walkways and driveways and fix any cracks.  Remove anything that might make you trip as you walk through a door, such as a raised step or threshold.  Trim any bushes or trees on the path to your home.  Use bright outdoor lighting.  Clear any walking paths of anything that might make someone trip, such as rocks or tools.  Regularly check to see if handrails are loose or broken. Make sure that both sides of any steps have handrails.  Any raised decks and porches should have guardrails on the edges.  Have any leaves, snow, or ice cleared regularly.  Use sand or salt on walking paths during winter.  Clean up any spills in your garage right away. This includes oil or grease spills. What can I do in the bathroom?  Use night lights.  Install grab bars by the toilet and in the tub and shower. Do not use towel bars as grab bars.  Use non-skid mats  or decals in the tub or shower.  If you need to sit down in the shower, use a plastic, non-slip stool.  Keep the floor dry. Clean up any water that spills on the floor as soon as it happens.  Remove soap buildup in the tub or shower regularly.  Attach bath mats securely with double-sided non-slip rug tape.  Do not have throw rugs and other things on the floor that can make you trip. What can I do in the bedroom?  Use night lights.  Make sure that you have a light by your bed that is easy to reach.  Do not use any sheets or blankets that are too big for your bed. They should not hang down onto the floor.  Have a firm chair that has side arms. You can use this for support while you get dressed.  Do not have throw rugs and other things on the floor that can make you trip. What can I do in the kitchen?  Clean up any spills right away.  Avoid walking on wet floors.  Keep items that you use a lot in easy-to-reach places.  If you need to reach something above you, use a strong step stool that has a grab bar.  Keep  electrical cords out of the way.  Do not use floor polish or wax that makes floors slippery. If you must use wax, use non-skid floor wax.  Do not have throw rugs and other things on the floor that can make you trip. What can I do with my stairs?  Do not leave any items on the stairs.  Make sure that there are handrails on both sides of the stairs and use them. Fix handrails that are broken or loose. Make sure that handrails are as long as the stairways.  Check any carpeting to make sure that it is firmly attached to the stairs. Fix any carpet that is loose or worn.  Avoid having throw rugs at the top or bottom of the stairs. If you do have throw rugs, attach them to the floor with carpet tape.  Make sure that you have a light switch at the top of the stairs and the bottom of the stairs. If you do not have them, ask someone to add them for you. What else can I do to  help prevent falls?  Wear shoes that:  Do not have high heels.  Have rubber bottoms.  Are comfortable and fit you well.  Are closed at the toe. Do not wear sandals.  If you use a stepladder:  Make sure that it is fully opened. Do not climb a closed stepladder.  Make sure that both sides of the stepladder are locked into place.  Ask someone to hold it for you, if possible.  Clearly mark and make sure that you can see:  Any grab bars or handrails.  First and last steps.  Where the edge of each step is.  Use tools that help you move around (mobility aids) if they are needed. These include:  Canes.  Walkers.  Scooters.  Crutches.  Turn on the lights when you go into a dark area. Replace any light bulbs as soon as they burn out.  Set up your furniture so you have a clear path. Avoid moving your furniture around.  If any of your floors are uneven, fix them.  If there are any pets around you, be aware of where they are.  Review your medicines with your doctor. Some medicines can make you feel dizzy. This can increase your chance of falling. Ask your doctor what other things that you can do to help prevent falls. This information is not intended to replace advice given to you by your health care provider. Make sure you discuss any questions you have with your health care provider. Document Released: 09/12/2009 Document Revised: 04/23/2016 Document Reviewed: 12/21/2014 Elsevier Interactive Patient Education  2017 [employer]

## 2019-06-12 ENCOUNTER — Ambulatory Visit: Payer: Medicare Other

## 2019-06-19 ENCOUNTER — Other Ambulatory Visit: Payer: Self-pay

## 2019-06-19 ENCOUNTER — Ambulatory Visit
Admission: RE | Admit: 2019-06-19 | Discharge: 2019-06-19 | Disposition: A | Discharge status: Home / Self Care | Payer: Medicare Other | Source: Ambulatory Visit | Attending: Family Medicine | Admitting: Family Medicine

## 2019-06-19 DIAGNOSIS — Z1231 Encounter for screening mammogram for malignant neoplasm of breast: Secondary | ICD-10-CM | POA: Diagnosis present

## 2019-06-30 ENCOUNTER — Telehealth: Payer: Self-pay | Admitting: Family Medicine

## 2019-06-30 NOTE — Telephone Encounter (Signed)
Lindsey George called in complain of fluid in her knees and needed to ask a question.

## 2019-07-04 ENCOUNTER — Encounter: Payer: Self-pay | Admitting: Family Medicine

## 2019-07-04 ENCOUNTER — Other Ambulatory Visit: Payer: Self-pay

## 2019-07-04 ENCOUNTER — Ambulatory Visit: Payer: Medicare Other | Admitting: Family Medicine

## 2019-07-04 VITALS — BP 120/62 | HR 76 | Ht 63.0 in | Wt 187.0 lb

## 2019-07-04 DIAGNOSIS — M940 Chondrocostal junction syndrome [Tietze]: Secondary | ICD-10-CM

## 2019-07-04 DIAGNOSIS — M7050 Other bursitis of knee, unspecified knee: Secondary | ICD-10-CM

## 2019-07-04 DIAGNOSIS — M170 Bilateral primary osteoarthritis of knee: Secondary | ICD-10-CM

## 2019-07-04 MED ORDER — MELOXICAM 7.5 MG PO TABS: 7.5000 mg | ORAL_TABLET | Freq: Every day | ORAL | 0 refills | Status: DC

## 2019-07-04 NOTE — Patient Instructions (Addendum)
Pes Anserine Bursitis  The pes anserine is an area on the inside of your knee, just below the joint, that is cushioned by a fluid-filled sac (bursa). Pes anserine bursitis is a condition that happens when the bursa gets swollen and irritated. The condition causes knee pain. What are the causes? This condition may be caused by:  Making the same movement over and over.  A direct hit (trauma) to the inside of the leg. What increases the risk? You are more likely to develop this condition if you:  Are a runner.  Play sports that involve a lot of running and quick side-to-side movements (cutting).  Are an athlete who plays contact sports.  Swim using an inward angle of the knee, such as with the breaststroke.  Have tight hamstring muscles.  Are a woman.  Are overweight.  Have flat feet.  Have diabetes or osteoarthritis. What are the signs or symptoms? Symptoms of this condition include:  Knee pain that gets better with rest and worse with activities like climbing stairs, walking, running, or getting in and out of a chair.  Swelling.  Warmth.  Tenderness when pressing at the inside of the lower leg, just below the knee. How is this diagnosed? This condition may be diagnosed based on:  Your symptoms.  Your medical history.  A physical exam. ? During your physical exam, your health care provider will press on the tendon attachment to see if you feel pain. ? Your health care provider may also check your hip and knee motion and strength.  Tests to check for swelling and fluid buildup in the bursa and to look at muscles, bones, and tendons. These tests might include: ? X-rays. ? MRI. ? Ultrasound. How is this treated? This condition may be treated by:  Resting your knee. You may be told to raise (elevate) your knee while resting.  Avoiding activities that cause pain.  Icing the inside of your knee.  Sleeping with a pillow between your knees. This will cushion your  injured knee.  Taking medicine by mouth (orally) to reduce pain and swelling or having medicine injected into your knee.  Doing strengthening and stretching exercises (physical therapy). If these treatments do not work or if the condition keeps coming back, you may need to have surgery to remove the bursa. Follow these instructions at home: Managing pain, stiffness, and swelling   If directed, put ice on the injured area. ? Put ice in a plastic bag. ? Place a towel between your skin and the bag. ? Leave the ice on for 20 minutes, 2-3 times a day.  Elevate the injured area above the level of your heart while you are sitting or lying down. Activity  Return to your normal activities as told by your health care provider. Ask your health care provider what activities are safe for you.  Do exercises as told by your health care provider. General instructions  Take over-the-counter and prescription medicines only as told by your health care provider.  Sleep with a pillow between your knees.  Do not use any products that contain nicotine or tobacco, such as cigarettes, e-cigarettes, and chewing tobacco. These can delay healing. If you need help quitting, ask your health care provider.  If you are overweight, work with your health care provider and a dietitian to set a weight-loss goal that is healthy and reasonable for you.  Keep all follow-up visits as told by your health care provider. This is important. How is this prevented?    When exercising, make sure that you: ? Warm up and stretch before being active. ? Cool down and stretch after being active. ? Give your body time to rest between periods of activity. ? Use equipment that fits you. ? Are safe and responsible while being active to avoid falls. ? Do at least 150 minutes of moderate-intensity exercise each week, such as brisk walking or water aerobics. ? Maintain physical fitness, including:  Strength.  Flexibility.   Cardiovascular fitness.  Endurance. ? Maintain a healthy weight. Contact a health care provider if:  Your symptoms do not improve.  Your symptoms get worse. Summary  Pes anserine bursitis is a condition that happens when the fluid-filled sac (bursa) at the inside of your knee gets swollen and irritated. The condition causes knee pain.  Treatment for pes anserine bursitis may include resting your knee, icing the inside of your knee, sleeping with a pillow between your knees, taking medicine by mouth or by injection, and doing strengthening and stretching exercises (physical therapy).  Follow instructions for managing pain, stiffness, and swelling.  Take over-the-counter and prescription medicines only as told by your health care provider. This information is not intended to replace advice given to you by your health care provider. Make sure you discuss any questions you have with your health care provider. Document Released: 11/16/2005 Document Revised: 03/09/2019 Document Reviewed: 04/27/2018 Elsevier Patient Education  2020 [place].  Costochondritis  Costochondritis is swelling and irritation (inflammation) of the tissue (cartilage) that connects your ribs to your breastbone (sternum). This causes pain in the front of your chest. The pain usually starts gradually and involves more than one rib. What are the causes? The exact cause of this condition is not always known. It results from stress on the cartilage where your ribs attach to your sternum. The cause of this stress could be:  Chest injury (trauma).  Exercise or activity, such as lifting.  Severe coughing. What increases the risk? You may be at higher risk for this condition if you:  Are female.  Are 21?69 years old.  Recently started a new exercise or work activity.  Have low levels of vitamin D.  Have a condition that makes you cough frequently. What are the signs or symptoms? The main symptom of this  condition is chest pain. The pain:  Usually starts gradually and can be sharp or dull.  Gets worse with deep breathing, coughing, or exercise.  Gets better with rest.  May be worse when you press on the sternum-rib connection (tenderness). How is this diagnosed? This condition is diagnosed based on your symptoms, medical history, and a physical exam. Your health care provider will check for tenderness when pressing on your sternum. This is the most important finding. You may also have tests to rule out other causes of chest pain. These may include:  A chest X-ray to check for lung problems.  An electrocardiogram (ECG) to see if you have a heart problem that could be causing the pain.  An imaging scan to rule out a chest or rib fracture. How is this treated? This condition usually goes away on its own over time. Your health care provider may prescribe an NSAID to reduce pain and inflammation. Your health care provider may also suggest that you:  Rest and avoid activities that make pain worse.  Apply heat or cold to the area to reduce pain and inflammation.  Do exercises to stretch your chest muscles. If these treatments do not help, your health  care provider may inject a numbing medicine at the sternum-rib connection to help relieve the pain. Follow these instructions at home:  Avoid activities that make pain worse. This includes any activities that use chest, abdominal, and side muscles.  If directed, put ice on the painful area: ? Put ice in a plastic bag. ? Place a towel between your skin and the bag. ? Leave the ice on for 20 minutes, 2-3 times a day.  If directed, apply heat to the affected area as often as told by your health care provider. Use the heat source that your health care provider recommends, such as a moist heat pack or a heating pad. ? Place a towel between your skin and the heat source. ? Leave the heat on for 20-30 minutes. ? Remove the heat if your skin turns  bright red. This is especially important if you are unable to feel pain, heat, or cold. You may have a greater risk of getting burned.  Take over-the-counter and prescription medicines only as told by your health care provider.  Return to your normal activities as told by your health care provider. Ask your health care provider what activities are safe for you.  Keep all follow-up visits as told by your health care provider. This is important. Contact a health care provider if:  You have chills or a fever.  Your pain does not go away or it gets worse.  You have a cough that does not go away (is persistent). Get help right away if:  You have shortness of breath. This information is not intended to replace advice given to you by your health care provider. Make sure you discuss any questions you have with your health care provider. Document Released: 08/26/2005 Document Revised: 12/01/2017 Document Reviewed: 03/11/2016 Elsevier Patient Education  2020 [employer].

## 2019-07-04 NOTE — Progress Notes (Signed)
Date:  07/04/2019   Name:  Lindsey George   DOB:  Aug 26, 1951   MRN:  [id number]   Chief Complaint: Joint Swelling (bilateral knees swelling- below the knees and in ankles. No pain)  Knee Pain  Incident onset: for knee swelling. There was no injury mechanism ("back a while"). The pain is present in the right knee and left knee. The quality of the pain is described as aching (right knee). The pain is mild. The pain has been fluctuating since onset. Pertinent negatives include no inability to bear weight, loss of motion, loss of sensation, muscle weakness, numbness or tingling. The symptoms are aggravated by movement, weight bearing and palpation. She has tried rest for the symptoms. The treatment provided mild relief.    Review of Systems  Constitutional: Negative.  Negative for chills, fatigue, fever and unexpected weight change.  HENT: Negative for congestion, ear discharge, ear pain, rhinorrhea, sinus pressure, sneezing and sore throat.   Eyes: Negative for photophobia, pain, discharge, redness and itching.  Respiratory: Negative for cough, shortness of breath, wheezing and stridor.   Cardiovascular: Positive for chest pain. Negative for palpitations and leg swelling.  Gastrointestinal: Negative for abdominal pain, blood in stool, constipation, diarrhea, nausea and vomiting.  Endocrine: Negative for cold intolerance, heat intolerance, polydipsia, polyphagia and polyuria.  Genitourinary: Negative for dysuria, flank pain, frequency, hematuria, menstrual problem, pelvic pain, urgency, vaginal bleeding and vaginal discharge.  Musculoskeletal: Negative for arthralgias, back pain and myalgias.  Skin: Negative for rash.  Allergic/Immunologic: Negative for environmental allergies and food allergies.  Neurological: Negative for dizziness, tingling, weakness, light-headedness, numbness and headaches.  Hematological: Negative for adenopathy. Does not bruise/bleed easily.   Psychiatric/Behavioral: Negative for dysphoric mood. The patient is not nervous/anxious.     Patient Active Problem List   Diagnosis Date Noted  . Morbid obesity ([place]) 03/29/2019  . Allergic rhinitis 01/19/2018  . Hx of adenomatous colonic polyps 01/14/2018  . Reactive airway disease without complication 78/24/2353  . Back ache 05/22/2015  . Alimentary obesity 05/22/2015  . Type 2 diabetes mellitus with complication, without long-term current use of insulin ([place]) 04/12/2014  . Hypercholesterolemia 04/12/2014  . OP (osteoporosis) 04/12/2014    Allergies  Allergen Reactions  . Codeine Other (See Comments)    Past Surgical History:  Procedure Laterality Date  . ABDOMINAL HYSTERECTOMY    . BREAST BIOPSY Right 06/22/11   bx/clip-neg  . BREAST CYST ASPIRATION Left    neg  . COLONOSCOPY  2014   cleared for 5 yrs- [facility] doc  . COLONOSCOPY WITH PROPOFOL N/A 03/25/2018   Procedure: COLONOSCOPY WITH PROPOFOL;  Surgeon: Manya Silvas, MD;  Location: [facility] ENDOSCOPY;  Service: Endoscopy;  Laterality: N/A;  . PARTIAL HYSTERECTOMY    . SPLENECTOMY      Social History   Tobacco Use  . Smoking status: Never Smoker  . Smokeless tobacco: Never Used  . Tobacco comment: smoking cessation materials not required  Substance Use Topics  . Alcohol use: No    Alcohol/week: 0.0 standard drinks  . Drug use: No     Medication list has been reviewed and updated.  Current Meds  Medication Sig  . albuterol (PROVENTIL HFA;VENTOLIN HFA) 108 (90 Base) MCG/ACT inhaler Inhale 2 puffs into the lungs every 6 (six) hours as needed for wheezing or shortness of breath.  Marland Kitchen albuterol (PROVENTIL) (2.5 MG/3ML) 0.083% nebulizer solution Take 3 mLs (2.5 mg total) by nebulization every 6 (six) hours as needed for wheezing or shortness  of breath.  Marland Kitchen alendronate (FOSAMAX) 70 MG tablet Take 1 tablet (70 mg total) by mouth every 7 (seven) days. Take with a full glass of water on an empty stomach.  Marland Kitchen aspirin 81 MG  tablet Take 81 mg by mouth daily.  . calcium-vitamin D (OSCAL WITH D) 500-200 MG-UNIT tablet Take 1 tablet by mouth.  . EPIPEN 2-PAK 0.3 MG/0.3ML SOAJ injection   . glipiZIDE (GLIPIZIDE XL) 5 MG 24 hr tablet Take 1 tablet (5 mg total) by mouth daily.  Marland Kitchen loratadine (CLARITIN) 10 MG tablet Take 10 mg by mouth daily. otc  . losartan (COZAAR) 25 MG tablet Take 1 tablet (25 mg total) by mouth daily.  . metFORMIN (GLUCOPHAGE) 500 MG tablet TAKE ONE TABLET BY MOUTH TWICE DAILY  . Multiple Vitamin (MULTIVITAMIN) capsule Take 1 capsule by mouth daily.  Glory Rosebush ULTRA test strip USE ONE STRIP TO CHECK GLUCOSE ONCE DAILY  . pioglitazone (ACTOS) 15 MG tablet Take 1 tablet (15 mg total) by mouth daily.  . simvastatin (ZOCOR) 40 MG tablet TAKE ONE TABLET BY MOUTH ONCE DAILY   Current Facility-Administered Medications for the 07/04/19 encounter (Office Visit) with Juline Patch, MD  Medication  . albuterol (PROVENTIL) (2.5 MG/3ML) 0.083% nebulizer solution 2.5 mg  . ipratropium-albuterol (DUONEB) 0.5-2.5 (3) MG/3ML nebulizer solution 3 mL    PHQ 2/9 Scores 07/04/2019 06/05/2019 03/29/2019 06/01/2018  PHQ - 2 Score 0 2 2 0  PHQ- 9 Score 0 3 4 0    BP Readings from Last 3 Encounters:  07/04/19 120/62  06/05/19 122/72  05/09/19 131/81    Physical Exam Vitals signs and nursing note reviewed.  Constitutional:      General: She is not in acute distress.    Appearance: She is not diaphoretic.  HENT:     Head: Normocephalic and atraumatic.     Right Ear: External ear normal.     Left Ear: External ear normal.     Nose: Nose normal.  Eyes:     General:        Right eye: No discharge.        Left eye: No discharge.     Conjunctiva/sclera: Conjunctivae normal.     Pupils: Pupils are equal, round, and reactive to light.  Neck:     Musculoskeletal: Normal range of motion and neck supple.     Thyroid: No thyromegaly.     Vascular: No JVD.  Cardiovascular:     Rate and Rhythm: Normal rate and regular  rhythm.     Heart sounds: Normal heart sounds, S1 normal and S2 normal. No murmur. No systolic murmur. No diastolic murmur. No friction rub. No gallop. No S3 or S4 sounds.   Pulmonary:     Effort: Pulmonary effort is normal.     Breath sounds: Normal breath sounds.  Chest:     Chest wall: Tenderness present.     Breasts:        Right: Normal. No swelling or tenderness.        Left: Normal. No swelling or tenderness.  Abdominal:     General: Bowel sounds are normal.     Palpations: Abdomen is soft. There is no mass.     Tenderness: There is no abdominal tenderness. There is no guarding.  Musculoskeletal: Normal range of motion.     Right knee: She exhibits no swelling and no effusion. Tenderness found. Medial joint line tenderness noted. No lateral joint line, no MCL, no LCL and no  patellar tendon tenderness noted.     Left knee: She exhibits no swelling and no effusion. Tenderness found. Medial joint line tenderness noted. No lateral joint line, no MCL, no LCL and no patellar tendon tenderness noted.  Lymphadenopathy:     Cervical: No cervical adenopathy.  Skin:    General: Skin is warm and dry.  Neurological:     Mental Status: She is alert.     Deep Tendon Reflexes: Reflexes are normal and symmetric.     Wt Readings from Last 3 Encounters:  07/04/19 187 lb (84.8 kg)  06/05/19 188 lb (85.3 kg)  05/09/19 189 lb (85.7 kg)    BP 120/62   Pulse 76   Ht 5\' 3"  (1.6 m)   Wt 187 lb (84.8 kg)   BMI 33.13 kg/m    Assessment and Plan: 1. Primary osteoarthritis of both knees Recurrent.  Acute exacerbation.  Patient is noted to have some discomfort and exam is consistent with osteoarthritis with joint line tenderness bilateral but no effusion of the knee joint itself will initiate meloxicam 7.5 mg once a day. - meloxicam (MOBIC) 7.5 MG tablet; Take 1 tablet (7.5 mg total) by mouth daily.  Dispense: 30 tablet; Refill: 0  2. Pes anserine bursitis Puffiness the patient is seen is  actually the Pez anserine bursa which is seen in the knee and now in both legs.  They are not particularly exquisitely tender but some discomfort this is consistent with a mild bursitis of the pedis anserine bursa and will treat with the meloxicam. - meloxicam (MOBIC) 7.5 MG tablet; Take 1 tablet (7.5 mg total) by mouth daily.  Dispense: 30 tablet; Refill: 0  3. Costochondritis Patient is exquisitely tender over the cartilaginous costochondral joints on the left side ninth and 10th ribs.  Patient attributes this to a breast exam but I assured her that it is unlikely that is that because it does not involve the soft tissue of the breast itself and there is no palpable tenderness of the breast nor up palpable swelling.  But there is tenderness of the costochondral joints information was given to the patient of costochondritis.  We will treat with the meloxicam and give it some time for resolution. - meloxicam (MOBIC) 7.5 MG tablet; Take 1 tablet (7.5 mg total) by mouth daily.  Dispense: 30 tablet; Refill: 0

## 2019-07-18 ENCOUNTER — Other Ambulatory Visit: Payer: Self-pay | Admitting: Family Medicine

## 2019-07-18 DIAGNOSIS — E7800 Pure hypercholesterolemia, unspecified: Secondary | ICD-10-CM

## 2019-09-01 ENCOUNTER — Encounter: Payer: Self-pay | Admitting: Family Medicine

## 2019-09-01 ENCOUNTER — Ambulatory Visit: Payer: Medicare Other | Admitting: Family Medicine

## 2019-09-01 ENCOUNTER — Other Ambulatory Visit: Payer: Self-pay

## 2019-09-01 VITALS — BP 120/70 | HR 80 | Ht 63.0 in | Wt 187.0 lb

## 2019-09-01 DIAGNOSIS — E7800 Pure hypercholesterolemia, unspecified: Secondary | ICD-10-CM | POA: Diagnosis not present

## 2019-09-01 DIAGNOSIS — M810 Age-related osteoporosis without current pathological fracture: Secondary | ICD-10-CM | POA: Diagnosis not present

## 2019-09-01 DIAGNOSIS — I10 Essential (primary) hypertension: Secondary | ICD-10-CM | POA: Diagnosis not present

## 2019-09-01 DIAGNOSIS — E119 Type 2 diabetes mellitus without complications: Secondary | ICD-10-CM | POA: Diagnosis not present

## 2019-09-01 DIAGNOSIS — Z23 Encounter for immunization: Secondary | ICD-10-CM

## 2019-09-01 MED ORDER — METFORMIN HCL 500 MG PO TABS: ORAL_TABLET | ORAL | 1 refills | Status: DC

## 2019-09-01 MED ORDER — ALENDRONATE SODIUM 70 MG PO TABS: 70.0000 mg | ORAL_TABLET | ORAL | 1 refills | Status: DC

## 2019-09-01 MED ORDER — GLIPIZIDE ER 5 MG PO TB24: 5.0000 mg | ORAL_TABLET | Freq: Every day | ORAL | 1 refills | Status: DC

## 2019-09-01 MED ORDER — SIMVASTATIN 40 MG PO TABS: ORAL_TABLET | ORAL | 1 refills | Status: DC

## 2019-09-01 MED ORDER — PIOGLITAZONE HCL 15 MG PO TABS: 15.0000 mg | ORAL_TABLET | Freq: Every day | ORAL | 1 refills | Status: DC

## 2019-09-01 MED ORDER — LOSARTAN POTASSIUM 25 MG PO TABS: 25.0000 mg | ORAL_TABLET | Freq: Every day | ORAL | 1 refills | Status: DC

## 2019-09-01 NOTE — Progress Notes (Signed)
Date:  09/01/2019   Name:  Lindsey George   DOB:  [birth year]   MRN:  [id number]   Chief Complaint: Hyperlipidemia, Hypertension, Diabetes, influenza vacc need, and Osteoporosis  Hyperlipidemia This is a chronic problem. The current episode started more than 1 year ago. The problem is controlled. Recent lipid tests were reviewed and are normal. She has no history of chronic renal disease, diabetes, hypothyroidism, liver disease, obesity or nephrotic syndrome. Pertinent negatives include no chest pain, focal sensory loss, focal weakness, leg pain, myalgias or shortness of breath. Current antihyperlipidemic treatment includes statins. The current treatment provides moderate improvement of lipids. There are no compliance problems.  There are no known risk factors for coronary artery disease.  Hypertension This is a chronic problem. The current episode started more than 1 year ago. The problem has been waxing and waning since onset. The problem is controlled. Pertinent negatives include no anxiety, blurred vision, chest pain, headaches, malaise/fatigue, neck pain, orthopnea, palpitations, peripheral edema, PND, shortness of breath or sweats. Past treatments include angiotensin blockers. There are no compliance problems.  There is no history of angina, kidney disease, CAD/MI, CVA, heart failure, left ventricular hypertrophy, PVD or retinopathy. There is no history of chronic renal disease, a hypertension causing med or renovascular disease.  Diabetes She presents for her follow-up diabetic visit. She has gestational diabetes mellitus. Her disease course has been stable. There are no hypoglycemic associated symptoms. Pertinent negatives for hypoglycemia include no confusion, dizziness, headaches, hunger, nervousness/anxiousness or sweats. Pertinent negatives for diabetes include no blurred vision, no chest pain, no fatigue, no foot paresthesias, no foot ulcerations, no polydipsia, no polyphagia, no  polyuria, no visual change, no weakness and no weight loss. There are no diabetic complications. Pertinent negatives for diabetic complications include no CVA, PVD or retinopathy. Current diabetic treatment includes oral agent (triple therapy) (actos/metformen/glipizide). She is following a generally healthy diet. Her breakfast blood glucose is taken between 8-9 am. An ACE inhibitor/angiotensin II receptor blocker is being taken.    Review of Systems  Constitutional: Negative for chills, fatigue, fever, malaise/fatigue and weight loss.  HENT: Negative for drooling, ear discharge, ear pain and sore throat.   Eyes: Negative for blurred vision.  Respiratory: Negative for cough, shortness of breath and wheezing.   Cardiovascular: Negative for chest pain, palpitations, orthopnea, leg swelling and PND.  Gastrointestinal: Negative for abdominal pain, blood in stool, constipation, diarrhea and nausea.  Endocrine: Negative for polydipsia, polyphagia and polyuria.  Genitourinary: Negative for dysuria, frequency, hematuria and urgency.  Musculoskeletal: Negative for back pain, myalgias and neck pain.  Skin: Negative for rash.  Allergic/Immunologic: Negative for environmental allergies.  Neurological: Negative for dizziness, focal weakness, weakness and headaches.  Hematological: Does not bruise/bleed easily.  Psychiatric/Behavioral: Negative for confusion and suicidal ideas. The patient is not nervous/anxious.     Patient Active Problem List   Diagnosis Date Noted  . Morbid obesity ([place]) 03/29/2019  . Allergic rhinitis 01/19/2018  . Hx of adenomatous colonic polyps 01/14/2018  . Reactive airway disease without complication [id number]  . Back ache 05/22/2015  . Alimentary obesity 05/22/2015  . Type 2 diabetes mellitus with complication, without long-term current use of insulin ([place]) 04/12/2014  . Hypercholesterolemia 04/12/2014  . OP (osteoporosis) 04/12/2014    Allergies  Allergen Reactions   . Codeine Other (See Comments)    Past Surgical History:  Procedure Laterality Date  . ABDOMINAL HYSTERECTOMY    . BREAST BIOPSY Right 06/22/11   bx/clip-neg  .  BREAST CYST ASPIRATION Left    neg  . COLONOSCOPY  2014   cleared for 5 yrs- [place] doc  . COLONOSCOPY WITH PROPOFOL N/A 03/25/2018   Procedure: COLONOSCOPY WITH PROPOFOL;  Surgeon: Manya Silvas, MD;  Location: [facility] ENDOSCOPY;  Service: Endoscopy;  Laterality: N/A;  . PARTIAL HYSTERECTOMY    . SPLENECTOMY      Social History   Tobacco Use  . Smoking status: Never Smoker  . Smokeless tobacco: Never Used  . Tobacco comment: smoking cessation materials not required  Substance Use Topics  . Alcohol use: No    Alcohol/week: 0.0 standard drinks  . Drug use: No     Medication list has been reviewed and updated.  Current Meds  Medication Sig  . albuterol (PROVENTIL HFA;VENTOLIN HFA) 108 (90 Base) MCG/ACT inhaler Inhale 2 puffs into the lungs every 6 (six) hours as needed for wheezing or shortness of breath.  Marland Kitchen albuterol (PROVENTIL) (2.5 MG/3ML) 0.083% nebulizer solution Take 3 mLs (2.5 mg total) by nebulization every 6 (six) hours as needed for wheezing or shortness of breath.  Marland Kitchen alendronate (FOSAMAX) 70 MG tablet Take 1 tablet (70 mg total) by mouth every 7 (seven) days. Take with a full glass of water on an empty stomach.  Marland Kitchen aspirin 81 MG tablet Take 81 mg by mouth daily.  . calcium-vitamin D (OSCAL WITH D) 500-200 MG-UNIT tablet Take 1 tablet by mouth.  . EPIPEN 2-PAK 0.3 MG/0.3ML SOAJ injection   . glipiZIDE (GLIPIZIDE XL) 5 MG 24 hr tablet Take 1 tablet (5 mg total) by mouth daily.  Marland Kitchen loratadine (CLARITIN) 10 MG tablet Take 10 mg by mouth daily. otc  . losartan (COZAAR) 25 MG tablet Take 1 tablet (25 mg total) by mouth daily.  . metFORMIN (GLUCOPHAGE) 500 MG tablet TAKE ONE TABLET BY MOUTH TWICE DAILY  . Multiple Vitamin (MULTIVITAMIN) capsule Take 1 capsule by mouth daily.  Glory Rosebush ULTRA test strip USE ONE  STRIP TO CHECK GLUCOSE ONCE DAILY  . pioglitazone (ACTOS) 15 MG tablet Take 1 tablet (15 mg total) by mouth daily.  . simvastatin (ZOCOR) 40 MG tablet Take 1 tablet by mouth once daily  . [DISCONTINUED] meloxicam (MOBIC) 7.5 MG tablet Take 1 tablet (7.5 mg total) by mouth daily.   Current Facility-Administered Medications for the 09/01/19 encounter (Office Visit) with Juline Patch, MD  Medication  . albuterol (PROVENTIL) (2.5 MG/3ML) 0.083% nebulizer solution 2.5 mg  . ipratropium-albuterol (DUONEB) 0.5-2.5 (3) MG/3ML nebulizer solution 3 mL    PHQ 2/9 Scores 09/01/2019 07/04/2019 06/05/2019 03/29/2019  PHQ - 2 Score 0 0 2 2  PHQ- 9 Score 0 0 3 4    BP Readings from Last 3 Encounters:  09/01/19 120/70  07/04/19 120/62  06/05/19 122/72    Physical Exam Vitals signs and nursing note reviewed.  Constitutional:      Appearance: She is well-developed.  HENT:     Head: Normocephalic.     Right Ear: Tympanic membrane, ear canal and external ear normal.     Left Ear: Tympanic membrane, ear canal and external ear normal.     Nose: Nose normal.     Mouth/Throat:     Mouth: Mucous membranes are moist.  Eyes:     General: Lids are everted, no foreign bodies appreciated. No scleral icterus.       Left eye: No foreign body or hordeolum.     Conjunctiva/sclera: Conjunctivae normal.     Right eye: Right conjunctiva is  not injected.     Left eye: Left conjunctiva is not injected.     Pupils: Pupils are equal, round, and reactive to light.  Neck:     Musculoskeletal: Normal range of motion and neck supple.     Thyroid: No thyromegaly.     Vascular: No JVD.     Trachea: No tracheal deviation.  Cardiovascular:     Rate and Rhythm: Normal rate and regular rhythm.     Heart sounds: Normal heart sounds. No murmur. No friction rub. No gallop.   Pulmonary:     Effort: Pulmonary effort is normal. No respiratory distress.     Breath sounds: Normal breath sounds. No wheezing, rhonchi or rales.   Abdominal:     General: Bowel sounds are normal.     Palpations: Abdomen is soft. There is no mass.     Tenderness: There is no abdominal tenderness. There is no guarding or rebound.  Musculoskeletal: Normal range of motion.        General: No tenderness.  Lymphadenopathy:     Cervical: No cervical adenopathy.  Skin:    General: Skin is warm.     Findings: No rash.  Neurological:     Mental Status: She is alert and oriented to person, place, and time.     Cranial Nerves: No cranial nerve deficit.     Deep Tendon Reflexes: Reflexes normal.  Psychiatric:        Mood and Affect: Mood is not anxious or depressed.     Wt Readings from Last 3 Encounters:  09/01/19 187 lb (84.8 kg)  07/04/19 187 lb (84.8 kg)  06/05/19 188 lb (85.3 kg)    BP 120/70   Pulse 80   Ht 5\' 3"  (1.6 m)   Wt 187 lb (84.8 kg)   BMI 33.13 kg/m   Assessment and Plan:  1. Age-related osteoporosis without current pathological fracture Chronic.  Controlled.  Will continue Fosamax 70 mg weekly for another year and then reassess after a year whether to continue at this stage. - alendronate (FOSAMAX) 70 MG tablet; Take 1 tablet (70 mg total) by mouth every 7 (seven) days. Take with a full glass of water on an empty stomach.  Dispense: 12 tablet; Refill: 1  2. Type 2 diabetes mellitus without complication, without long-term current use of insulin (HCC) Chronic.  Controlled.  Continue glipizide XL 5 mg once a day, metformin 500 mg twice a day, and Actos 15 mg once a day.  Will check A1c to determine his level of control. - HgB A1c - glipiZIDE (GLIPIZIDE XL) 5 MG 24 hr tablet; Take 1 tablet (5 mg total) by mouth daily.  Dispense: 90 tablet; Refill: 1 - metFORMIN (GLUCOPHAGE) 500 MG tablet; TAKE ONE TABLET BY MOUTH TWICE DAILY  Dispense: 180 tablet; Refill: 1 - pioglitazone (ACTOS) 15 MG tablet; Take 1 tablet (15 mg total) by mouth daily.  Dispense: 90 tablet; Refill: 1  3. Essential hypertension Chronic.   Controlled.  Continue losartan 25 mg once a day for protection for nephrotoxicity of diabetes and control of blood pressure.  We will review renal panel and will not need to repeat at this time and will do so in 6 months - losartan (COZAAR) 25 MG tablet; Take 1 tablet (25 mg total) by mouth daily.  Dispense: 90 tablet; Refill: 1  4. Hypercholesterolemia Chronic.  Controlled.  Continue simvastatin 40 mg once a day.  Review of previous lipid panel is within normal limits on control  with medication.  Will repeat in 6 months upon return. - simvastatin (ZOCOR) 40 MG tablet; Take 1 tablet by mouth once daily  Dispense: 90 tablet; Refill: 1

## 2019-09-02 LAB — HEMOGLOBIN A1C
Est. average glucose Bld gHb Est-mCnc: 148 mg/dL
Hgb A1c MFr Bld: 6.8 % — ABNORMAL HIGH (ref 4.8–5.6)

## 2019-09-07 ENCOUNTER — Ambulatory Visit: Payer: Medicare Other | Admitting: Family Medicine

## 2019-09-07 ENCOUNTER — Encounter: Payer: Self-pay | Admitting: Family Medicine

## 2019-09-07 ENCOUNTER — Other Ambulatory Visit: Payer: Self-pay

## 2019-09-07 VITALS — BP 128/82 | HR 84 | Resp 16 | Ht 63.0 in | Wt 186.0 lb

## 2019-09-07 DIAGNOSIS — M7062 Trochanteric bursitis, left hip: Secondary | ICD-10-CM | POA: Diagnosis not present

## 2019-09-07 DIAGNOSIS — M7632 Iliotibial band syndrome, left leg: Secondary | ICD-10-CM | POA: Diagnosis not present

## 2019-09-07 MED ORDER — MELOXICAM 15 MG PO TABS: 15.0000 mg | ORAL_TABLET | Freq: Every day | ORAL | 0 refills | Status: DC

## 2019-09-07 NOTE — Progress Notes (Signed)
Date:  09/07/2019   Name:  Lindsey George   DOB:  1951-10-06   MRN:  [id number]   Chief Complaint: Hip Pain (1 year off and on - LEFT no injury to hip. )  Hip Pain  The incident occurred more than 1 week ago (worse last night after 2 mile walk). The incident occurred at home. There was no injury mechanism. The pain is present in the left thigh (pelvic). The quality of the pain is described as aching. The pain is moderate. The pain has been constant since onset. Pertinent negatives include no inability to bear weight, loss of motion, loss of sensation, muscle weakness, numbness or tingling. She has tried NSAIDs for the symptoms. The treatment provided moderate relief.    Review of Systems  Neurological: Negative for tingling and numbness.    Patient Active Problem List   Diagnosis Date Noted  . Morbid obesity ([place]) 03/29/2019  . Allergic rhinitis 01/19/2018  . Hx of adenomatous colonic polyps 01/14/2018  . Reactive airway disease without complication [id number]  . Back ache 05/22/2015  . Alimentary obesity 05/22/2015  . Type 2 diabetes mellitus with complication, without long-term current use of insulin ([place]) 04/12/2014  . Hypercholesterolemia 04/12/2014  . OP (osteoporosis) 04/12/2014    Allergies  Allergen Reactions  . Codeine Other (See Comments)    Past Surgical History:  Procedure Laterality Date  . ABDOMINAL HYSTERECTOMY    . BREAST BIOPSY Right 06/22/11   bx/clip-neg  . BREAST CYST ASPIRATION Left    neg  . COLONOSCOPY  2014   cleared for 5 yrs- [place] doc  . COLONOSCOPY WITH PROPOFOL N/A 03/25/2018   Procedure: COLONOSCOPY WITH PROPOFOL;  Surgeon: Manya Silvas, MD;  Location: [facility] ENDOSCOPY;  Service: Endoscopy;  Laterality: N/A;  . PARTIAL HYSTERECTOMY    . SPLENECTOMY      Social History   Tobacco Use  . Smoking status: Never Smoker  . Smokeless tobacco: Never Used  . Tobacco comment: smoking cessation materials not required  Substance Use Topics   . Alcohol use: No    Alcohol/week: 0.0 standard drinks  . Drug use: No     Medication list has been reviewed and updated.  Current Meds  Medication Sig  . albuterol (PROVENTIL HFA;VENTOLIN HFA) 108 (90 Base) MCG/ACT inhaler Inhale 2 puffs into the lungs every 6 (six) hours as needed for wheezing or shortness of breath.  Marland Kitchen albuterol (PROVENTIL) (2.5 MG/3ML) 0.083% nebulizer solution Take 3 mLs (2.5 mg total) by nebulization every 6 (six) hours as needed for wheezing or shortness of breath.  Marland Kitchen alendronate (FOSAMAX) 70 MG tablet Take 1 tablet (70 mg total) by mouth every 7 (seven) days. Take with a full glass of water on an empty stomach.  Marland Kitchen aspirin 81 MG tablet Take 81 mg by mouth daily.  . calcium-vitamin D (OSCAL WITH D) 500-200 MG-UNIT tablet Take 1 tablet by mouth.  . EPIPEN 2-PAK 0.3 MG/0.3ML SOAJ injection   . glipiZIDE (GLIPIZIDE XL) 5 MG 24 hr tablet Take 1 tablet (5 mg total) by mouth daily.  Marland Kitchen loratadine (CLARITIN) 10 MG tablet Take 10 mg by mouth daily. otc  . losartan (COZAAR) 25 MG tablet Take 1 tablet (25 mg total) by mouth daily.  . metFORMIN (GLUCOPHAGE) 500 MG tablet TAKE ONE TABLET BY MOUTH TWICE DAILY  . Multiple Vitamin (MULTIVITAMIN) capsule Take 1 capsule by mouth daily.  Glory Rosebush ULTRA test strip USE ONE STRIP TO CHECK GLUCOSE ONCE DAILY  .  pioglitazone (ACTOS) 15 MG tablet Take 1 tablet (15 mg total) by mouth daily.  . simvastatin (ZOCOR) 40 MG tablet Take 1 tablet by mouth once daily   Current Facility-Administered Medications for the 09/07/19 encounter (Office Visit) with Juline Patch, MD  Medication  . albuterol (PROVENTIL) (2.5 MG/3ML) 0.083% nebulizer solution 2.5 mg  . ipratropium-albuterol (DUONEB) 0.5-2.5 (3) MG/3ML nebulizer solution 3 mL    PHQ 2/9 Scores 09/07/2019 09/01/2019 07/04/2019 06/05/2019  PHQ - 2 Score 0 0 0 2  PHQ- 9 Score 0 0 0 3    BP Readings from Last 3 Encounters:  09/07/19 128/82  09/01/19 120/70  07/04/19 120/62    Physical  Exam  Wt Readings from Last 3 Encounters:  09/07/19 186 lb (84.4 kg)  09/01/19 187 lb (84.8 kg)  07/04/19 187 lb (84.8 kg)    BP 128/82   Pulse 84   Resp 16   Ht 5\' 3"  (1.6 m)   Wt 186 lb (84.4 kg)   BMI 32.95 kg/m   Assessment and Plan:  1. Trochanteric bursitis of left hip Patient is noticed some discomfort for the last 2 weeks in the hip area on the left.  But yesterday she walked for 2 miles and then last night it started hurting and then became more painful when she awoke this morning.  She is exquisitely tenderness along the lateral aspect of the pelvis and trochanteric area.  This is consistent with either a bursitis or iliotibial band circumstance.  We will initiate meloxicam 15 mg once a day and place an orthopedic referral in for the possibility of need of an injection. - Ambulatory referral to Orthopedic Surgery - meloxicam (MOBIC) 15 MG tablet; Take 1 tablet (15 mg total) by mouth daily.  Dispense: 30 tablet; Refill: 0  2. Iliotibial band syndrome of left side We will initiate meloxicam 15 mg once a day and will place an orthopedic referral for suspected need for other intervention such as steroid injection in the future. - Ambulatory referral to Orthopedic Surgery - meloxicam (MOBIC) 15 MG tablet; Take 1 tablet (15 mg total) by mouth daily.  Dispense: 30 tablet; Refill: 0

## 2019-09-07 NOTE — Patient Instructions (Signed)
Hip Bursitis  Hip bursitis is inflammation of a fluid-filled sac (bursa) in the hip joint. The bursa prevents the bones in the hip joint from rubbing against each other. Hip bursitis can cause mild to moderate pain, and symptoms often come and go over time. What are the causes? This condition may be caused by:  Injury to the hip.  Overuse of the muscles that surround the hip joint.  Previous injury or surgery of the hip.  Arthritis or gout.  Diabetes.  Thyroid disease.  Infection. In some cases, the cause may not be known. What are the signs or symptoms? Symptoms of this condition include:  Mild or moderate pain in the hip area. Pain may get worse with movement.  Tenderness and swelling of the hip, especially on the outer side of the hip.  In rare cases, the bursa may become infected. This may cause a fever, as well as warmth and redness in the area. Symptoms may come and go. How is this diagnosed? This condition may be diagnosed based on:  A physical exam.  Your medical history.  X-rays.  Removal of fluid from your inflamed bursa for testing (biopsy). You may be sent to a health care provider who specializes in bone diseases (orthopedist) or a provider who specializes in joint inflammation (rheumatologist). How is this treated? This condition is treated by resting, icing, applying pressure (compression), and raising (elevating) the injured area. This is called RICE treatment. In some cases, this may be enough to make your symptoms go away. Treatment may also include:  Using crutches.  Draining fluid out of the bursa to help relieve swelling.  Injecting medicine that helps to reduce inflammation (cortisone).  Additional medicines if the bursa is infected. Follow these instructions at home: Managing pain, stiffness, and swelling   If directed, put ice on the painful area. ? Put ice in a plastic bag. ? Place a towel between your skin and the bag. ? Leave the ice  on for 20 minutes, 2-3 times a day. ? Raise (elevate) your hip above the level of your heart as much as you can without pain. To do this, try putting a pillow under your hips while you lie down. Activity  Return to your normal activities as told by your health care provider. Ask your health care provider what activities are safe for you.  Rest and protect your hip as much as possible until your pain and swelling get better. General instructions  Take over-the-counter and prescription medicines only as told by your health care provider.  Wear compression wraps only as told by your health care provider.  Do not use your hip to support your body weight until your health care provider says that you can. Use crutches as told by your health care provider.  Gently massage and stretch your injured area as often as is comfortable.  Keep all follow-up visits as told by your health care provider. This is important. How is this prevented?  Exercise regularly, as told by your health care provider.  Warm up and stretch before being active.  Cool down and stretch after being active.  If an activity irritates your hip or causes pain, avoid the activity as much as possible.  Avoid sitting down for long periods at a time. Contact a health care provider if you:  Have a fever.  Develop new symptoms.  Have difficulty walking or doing everyday activities.  Have pain that gets worse or does not get better with medicine.  Develop red skin or a feeling of warmth in your hip area. Get help right away if you:  Cannot move your hip.  Have severe pain. Summary  Hip bursitis is inflammation of a fluid-filled sac (bursa) in the hip joint.  Hip bursitis can cause mild to moderate pain, and symptoms often come and go over time.  This condition is treated with rest, ice, compression, elevation, and medicines. This information is not intended to replace advice given to you by your health care  provider. Make sure you discuss any questions you have with your health care provider. Document Released: 05/08/2002 Document Revised: 07/25/2018 Document Reviewed: 07/25/2018 Elsevier Patient Education  [place] Band Syndrome  Iliotibial band syndrome (ITBS) is a condition that often causes knee pain. It can also cause pain in the outside of your hip, thigh, and knee. The iliotibial band is a strip of tissue that runs from the outside of your hip and down your thigh to the outside of your knee. Repeatedly bending and straightening your knee can irritate the iliotibial band. What are the causes? This condition is caused by inflammation and irritation from the friction of the iliotibial band moving over the thigh bone (femur) when you repeatedly bend and straighten your knee. What increases the risk? This condition is more likely to develop in people who:  Frequently change elevation during their workouts.  Run very long distances.  Recently increased the length or intensity of their workouts.  Run downhill often, or just started running downhill.  Ride a bike very far or often. You may also be at greater risk if you start a new workout routine without first warming up or if you have a job that requires you to bend, squat, or climb frequently. What are the signs or symptoms? Symptoms of this condition include:  Pain along the outside of your knee that may be worse with activity, especially running or going up and down stairs.  A "snapping" sensation over your knee.  Swelling on the outside of your knee.  Pain or a feeling of tightness in your hip. How is this diagnosed? This condition is diagnosed based on your symptoms, medical history, and physical exam. You may also see a health care provider who specializes in reducing pain and increasing mobility (physical therapist). A physical therapist may do an exam to check your balance, movement, and way of walking or  running (gait) to see whether the way you move could contribute to your injury. You may also have tests to measure your strength, flexibility, and range of motion. How is this treated? Treatment for this condition includes:  Resting and limiting exercise.  Returning to activities gradually.  Doing range-of-motion and strengthening exercises (physical therapy) as told by your health care provider.  Including low-impact activities, such as swimming, in your exercise routine. Follow these instructions at home:  If directed, apply ice to the injured area. ? Put ice in a plastic bag. ? Place a towel between your skin and the bag. ? Leave the ice on for 20 minutes, 2-3 times per day.  Return to your normal activities as told by your health care provider. Ask your health care provider what activities are safe for you.  Keep all follow-up visits with your health care provider. This is important. Contact a health care provider if:  Your pain does not improve or gets worse despite treatment. This information is not intended to replace advice given to you by your health care provider.  Make sure you discuss any questions you have with your health care provider. Document Released: 05/08/2002 Document Revised: 10/29/2017 Document Reviewed: 12/18/2016 Elsevier Patient Education  2020 [employer].

## 2019-10-02 ENCOUNTER — Other Ambulatory Visit: Payer: Self-pay

## 2019-10-02 ENCOUNTER — Encounter: Payer: Self-pay | Admitting: Family Medicine

## 2019-10-02 ENCOUNTER — Ambulatory Visit: Payer: Medicare Other | Admitting: Family Medicine

## 2019-10-02 VITALS — BP 124/64 | HR 64 | Ht 63.0 in | Wt 183.0 lb

## 2019-10-02 DIAGNOSIS — N3001 Acute cystitis with hematuria: Secondary | ICD-10-CM | POA: Diagnosis not present

## 2019-10-02 LAB — POCT URINALYSIS DIPSTICK
Bilirubin, UA: NEGATIVE
Glucose, UA: NEGATIVE
Ketones, UA: NEGATIVE
Nitrite, UA: POSITIVE
Protein, UA: POSITIVE — AB
Spec Grav, UA: 1.015 (ref 1.010–1.025)
Urobilinogen, UA: 0.2 E.U./dL
pH, UA: 5 (ref 5.0–8.0)

## 2019-10-02 MED ORDER — CEPHALEXIN 500 MG PO CAPS: 500.0000 mg | ORAL_CAPSULE | Freq: Two times a day (BID) | ORAL | 0 refills | Status: DC

## 2019-10-02 NOTE — Progress Notes (Signed)
Date:  10/02/2019   Name:  Lindsey George   DOB:  [birth year]   MRN:  [id number]   Chief Complaint: Urinary Tract Infection (urinary frequency, little comes out)  Urinary Tract Infection  This is a new problem. The current episode started in the past 7 days (since Thursday). The problem occurs every urination. The problem has been gradually worsening. The patient is experiencing no pain. There has been no fever. Associated symptoms include frequency, hematuria, hesitancy and urgency. Pertinent negatives include no chills, discharge, flank pain, nausea or sweats. She has tried increased fluids for the symptoms. The treatment provided no relief.    Review of Systems  Constitutional: Negative for chills and fever.  HENT: Negative for drooling, ear discharge, ear pain and sore throat.   Respiratory: Negative for cough, shortness of breath and wheezing.   Cardiovascular: Negative for chest pain, palpitations and leg swelling.  Gastrointestinal: Negative for abdominal pain, blood in stool, constipation, diarrhea and nausea.  Endocrine: Negative for polydipsia.  Genitourinary: Positive for difficulty urinating, frequency, hematuria, hesitancy and urgency. Negative for dysuria and flank pain.  Musculoskeletal: Negative for back pain, myalgias and neck pain.  Skin: Negative for rash.  Allergic/Immunologic: Negative for environmental allergies.  Neurological: Negative for dizziness and headaches.  Hematological: Does not bruise/bleed easily.  Psychiatric/Behavioral: Negative for suicidal ideas. The patient is not nervous/anxious.     Patient Active Problem List   Diagnosis Date Noted  . Morbid obesity ([place]) 03/29/2019  . Allergic rhinitis 01/19/2018  . Hx of adenomatous colonic polyps 01/14/2018  . Reactive airway disease without complication [id number]  . Back ache 05/22/2015  . Alimentary obesity 05/22/2015  . Type 2 diabetes mellitus with complication, without long-term current use  of insulin ([place]) 04/12/2014  . Hypercholesterolemia 04/12/2014  . OP (osteoporosis) 04/12/2014    Allergies  Allergen Reactions  . Codeine Other (See Comments)    Past Surgical History:  Procedure Laterality Date  . ABDOMINAL HYSTERECTOMY    . BREAST BIOPSY Right 06/22/11   bx/clip-neg  . BREAST CYST ASPIRATION Left    neg  . COLONOSCOPY  2014   cleared for 5 yrs- [place] doc  . COLONOSCOPY WITH PROPOFOL N/A 03/25/2018   Procedure: COLONOSCOPY WITH PROPOFOL;  Surgeon: Manya Silvas, MD;  Location: [facility] ENDOSCOPY;  Service: Endoscopy;  Laterality: N/A;  . PARTIAL HYSTERECTOMY    . SPLENECTOMY      Social History   Tobacco Use  . Smoking status: Never Smoker  . Smokeless tobacco: Never Used  . Tobacco comment: smoking cessation materials not required  Substance Use Topics  . Alcohol use: No    Alcohol/week: 0.0 standard drinks  . Drug use: No     Medication list has been reviewed and updated.  Current Meds  Medication Sig  . albuterol (PROVENTIL HFA;VENTOLIN HFA) 108 (90 Base) MCG/ACT inhaler Inhale 2 puffs into the lungs every 6 (six) hours as needed for wheezing or shortness of breath.  Marland Kitchen albuterol (PROVENTIL) (2.5 MG/3ML) 0.083% nebulizer solution Take 3 mLs (2.5 mg total) by nebulization every 6 (six) hours as needed for wheezing or shortness of breath.  Marland Kitchen alendronate (FOSAMAX) 70 MG tablet Take 1 tablet (70 mg total) by mouth every 7 (seven) days. Take with a full glass of water on an empty stomach.  Marland Kitchen aspirin 81 MG tablet Take 81 mg by mouth daily.  . calcium-vitamin D (OSCAL WITH D) 500-200 MG-UNIT tablet Take 1 tablet by mouth.  . EPIPEN 2-PAK  0.3 MG/0.3ML SOAJ injection   . glipiZIDE (GLIPIZIDE XL) 5 MG 24 hr tablet Take 1 tablet (5 mg total) by mouth daily.  Marland Kitchen loratadine (CLARITIN) 10 MG tablet Take 10 mg by mouth daily. otc  . losartan (COZAAR) 25 MG tablet Take 1 tablet (25 mg total) by mouth daily.  . meloxicam (MOBIC) 15 MG tablet Take 1 tablet (15 mg  total) by mouth daily.  . metFORMIN (GLUCOPHAGE) 500 MG tablet TAKE ONE TABLET BY MOUTH TWICE DAILY  . Multiple Vitamin (MULTIVITAMIN) capsule Take 1 capsule by mouth daily.  Glory Rosebush ULTRA test strip USE ONE STRIP TO CHECK GLUCOSE ONCE DAILY  . pioglitazone (ACTOS) 15 MG tablet Take 1 tablet (15 mg total) by mouth daily.  . simvastatin (ZOCOR) 40 MG tablet Take 1 tablet by mouth once daily   Current Facility-Administered Medications for the 10/02/19 encounter (Office Visit) with Juline Patch, MD  Medication  . albuterol (PROVENTIL) (2.5 MG/3ML) 0.083% nebulizer solution 2.5 mg  . ipratropium-albuterol (DUONEB) 0.5-2.5 (3) MG/3ML nebulizer solution 3 mL    PHQ 2/9 Scores 10/02/2019 09/07/2019 09/01/2019 07/04/2019  PHQ - 2 Score 0 0 0 0  PHQ- 9 Score 0 0 0 0    BP Readings from Last 3 Encounters:  10/02/19 124/64  09/07/19 128/82  09/01/19 120/70    Physical Exam Vitals signs and nursing note reviewed.  Constitutional:      Appearance: She is well-developed.  HENT:     Head: Normocephalic.     Right Ear: Tympanic membrane, ear canal and external ear normal.     Left Ear: Tympanic membrane, ear canal and external ear normal.     Nose: No congestion or rhinorrhea.     Mouth/Throat:     Mouth: Mucous membranes are moist.  Eyes:     General: Lids are everted, no foreign bodies appreciated. No scleral icterus.       Left eye: No foreign body or hordeolum.     Conjunctiva/sclera: Conjunctivae normal.     Right eye: Right conjunctiva is not injected.     Left eye: Left conjunctiva is not injected.     Pupils: Pupils are equal, round, and reactive to light.  Neck:     Musculoskeletal: Normal range of motion and neck supple.     Thyroid: No thyromegaly.     Vascular: No JVD.     Trachea: No tracheal deviation.  Cardiovascular:     Rate and Rhythm: Normal rate and regular rhythm.     Heart sounds: Normal heart sounds. No murmur. No friction rub. No gallop.   Pulmonary:      Effort: Pulmonary effort is normal. No respiratory distress.     Breath sounds: Normal breath sounds. No stridor. No wheezing, rhonchi or rales.  Chest:     Chest wall: No tenderness.  Abdominal:     General: Bowel sounds are normal.     Palpations: Abdomen is soft. There is no mass.     Tenderness: There is no abdominal tenderness. There is no right CVA tenderness, left CVA tenderness, guarding or rebound.  Musculoskeletal: Normal range of motion.        General: No tenderness.  Lymphadenopathy:     Cervical: No cervical adenopathy.  Skin:    General: Skin is warm.     Findings: No rash.  Neurological:     Mental Status: She is alert and oriented to person, place, and time.     Cranial Nerves: No cranial  nerve deficit.     Deep Tendon Reflexes: Reflexes normal.  Psychiatric:        Mood and Affect: Mood is not anxious or depressed.     Wt Readings from Last 3 Encounters:  10/02/19 183 lb (83 kg)  09/07/19 186 lb (84.4 kg)  09/01/19 187 lb (84.8 kg)    BP 124/64   Pulse 64   Ht 5\' 3"  (1.6 m)   Wt 183 lb (83 kg)   BMI 32.42 kg/m   Assessment and Plan: 1. Acute cystitis with hematuria  Acute. Persistent . Cephalexin 500 mg bid for 5 days. - POCT urinalysis dipstick - cephALEXin (KEFLEX) 500 MG capsule; Take 1 capsule (500 mg total) by mouth 2 (two) times daily.  Dispense: 10 capsule; Refill: 0

## 2019-10-09 ENCOUNTER — Other Ambulatory Visit: Payer: Self-pay | Admitting: Family Medicine

## 2019-10-09 ENCOUNTER — Other Ambulatory Visit: Payer: Self-pay

## 2019-10-09 DIAGNOSIS — N3001 Acute cystitis with hematuria: Secondary | ICD-10-CM

## 2019-10-09 MED ORDER — NITROFURANTOIN MONOHYD MACRO 100 MG PO CAPS: 100.0000 mg | ORAL_CAPSULE | Freq: Two times a day (BID) | ORAL | 0 refills | Status: DC

## 2019-10-09 NOTE — Progress Notes (Unsigned)
Switched over to [employer] from cephalexin- sent in to [facility]

## 2019-10-31 ENCOUNTER — Ambulatory Visit
Admission: RE | Admit: 2019-10-31 | Discharge: 2019-10-31 | Disposition: A | Discharge status: Home / Self Care | Payer: Medicare Other | Source: Ambulatory Visit | Attending: Family Medicine | Admitting: Family Medicine

## 2019-10-31 ENCOUNTER — Ambulatory Visit (INDEPENDENT_AMBULATORY_CARE_PROVIDER_SITE_OTHER): Payer: Medicare Other | Admitting: Family Medicine

## 2019-10-31 ENCOUNTER — Encounter: Payer: Self-pay | Admitting: Family Medicine

## 2019-10-31 ENCOUNTER — Other Ambulatory Visit: Payer: Self-pay

## 2019-10-31 VITALS — BP 120/62 | HR 76 | Ht 63.0 in | Wt 185.0 lb

## 2019-10-31 DIAGNOSIS — M5030 Other cervical disc degeneration, unspecified cervical region: Secondary | ICD-10-CM

## 2019-10-31 DIAGNOSIS — M5412 Radiculopathy, cervical region: Secondary | ICD-10-CM | POA: Diagnosis not present

## 2019-10-31 MED ORDER — TRAMADOL HCL 50 MG PO TABS: 50.0000 mg | ORAL_TABLET | Freq: Three times a day (TID) | ORAL | 0 refills | Status: AC | PRN

## 2019-10-31 MED ORDER — MELOXICAM 15 MG PO TABS: 15.0000 mg | ORAL_TABLET | Freq: Every day | ORAL | 0 refills | Status: DC

## 2019-10-31 MED ORDER — PREDNISONE 10 MG PO TABS: 10.0000 mg | ORAL_TABLET | Freq: Every day | ORAL | 0 refills | Status: DC

## 2019-10-31 NOTE — Progress Notes (Signed)
Date:  10/31/2019   Name:  Lindsey George   DOB:  June 17, 1951   MRN:  [id number]   Chief Complaint: Arm Pain (has taken meloxicam for pain in past- arm hurts when "not supported")  Arm Pain  Incident onset: over a year. Incident location: fell out of the bed. The injury mechanism was a fall. The pain is present in the right shoulder, left elbow, left fingers, left shoulder, left wrist, left hand and upper left arm. The quality of the pain is described as aching. The pain is moderate. The pain has been worsening (last Friday) since the incident. Pertinent negatives include no chest pain, muscle weakness, numbness or tingling. She has tried acetaminophen and NSAIDs for the symptoms.    Lab Results  Component Value Date   CREATININE 0.57 03/29/2019   BUN 15 03/29/2019   NA 141 03/29/2019   K 4.6 03/29/2019   CL 98 03/29/2019   CO2 23 03/29/2019   Lab Results  Component Value Date   CHOL 130 03/29/2019   HDL 49 03/29/2019   LDLCALC 60 03/29/2019   TRIG 104 03/29/2019   CHOLHDL 2.9 09/19/2018   No results found for: TSH Lab Results  Component Value Date   HGBA1C 6.8 (H) 09/01/2019     Review of Systems  Constitutional: Negative.  Negative for chills, fatigue, fever and unexpected weight change.  HENT: Negative for congestion, ear discharge, ear pain, rhinorrhea, sinus pressure, sneezing and sore throat.   Eyes: Negative for photophobia, pain, discharge, redness and itching.  Respiratory: Negative for cough, shortness of breath, wheezing and stridor.   Cardiovascular: Negative for chest pain.  Gastrointestinal: Negative for abdominal pain, blood in stool, constipation, diarrhea, nausea and vomiting.  Endocrine: Negative for cold intolerance, heat intolerance, polydipsia, polyphagia and polyuria.  Genitourinary: Negative for dysuria, flank pain, frequency, hematuria, menstrual problem, pelvic pain, urgency, vaginal bleeding and vaginal discharge.  Musculoskeletal: Negative  for arthralgias, back pain and myalgias.  Skin: Negative for rash.  Allergic/Immunologic: Negative for environmental allergies and food allergies.  Neurological: Negative for dizziness, tingling, weakness, light-headedness, numbness and headaches.  Hematological: Negative for adenopathy. Does not bruise/bleed easily.  Psychiatric/Behavioral: Negative for dysphoric mood. The patient is not nervous/anxious.     Patient Active Problem List   Diagnosis Date Noted  . Morbid obesity ([place]) 03/29/2019  . Allergic rhinitis 01/19/2018  . Hx of adenomatous colonic polyps 01/14/2018  . Reactive airway disease without complication Q000111Q  . Back ache 05/22/2015  . Alimentary obesity 05/22/2015  . Type 2 diabetes mellitus with complication, without long-term current use of insulin ([place]) 04/12/2014  . Hypercholesterolemia 04/12/2014  . OP (osteoporosis) 04/12/2014    Allergies  Allergen Reactions  . Codeine Other (See Comments)    Past Surgical History:  Procedure Laterality Date  . ABDOMINAL HYSTERECTOMY    . BREAST BIOPSY Right 06/22/11   bx/clip-neg  . BREAST CYST ASPIRATION Left    neg  . COLONOSCOPY  2014   cleared for 5 yrs- [place] doc  . COLONOSCOPY WITH PROPOFOL N/A 03/25/2018   Procedure: COLONOSCOPY WITH PROPOFOL;  Surgeon: Manya Silvas, MD;  Location: [facility] ENDOSCOPY;  Service: Endoscopy;  Laterality: N/A;  . PARTIAL HYSTERECTOMY    . SPLENECTOMY      Social History   Tobacco Use  . Smoking status: Never Smoker  . Smokeless tobacco: Never Used  . Tobacco comment: smoking cessation materials not required  Substance Use Topics  . Alcohol use: No  Alcohol/week: 0.0 standard drinks  . Drug use: No     Medication list has been reviewed and updated.  Current Meds  Medication Sig  . albuterol (PROVENTIL HFA;VENTOLIN HFA) 108 (90 Base) MCG/ACT inhaler Inhale 2 puffs into the lungs every 6 (six) hours as needed for wheezing or shortness of breath.  Marland Kitchen albuterol  (PROVENTIL) (2.5 MG/3ML) 0.083% nebulizer solution Take 3 mLs (2.5 mg total) by nebulization every 6 (six) hours as needed for wheezing or shortness of breath.  Marland Kitchen alendronate (FOSAMAX) 70 MG tablet Take 1 tablet (70 mg total) by mouth every 7 (seven) days. Take with a full glass of water on an empty stomach.  Marland Kitchen aspirin 81 MG tablet Take 81 mg by mouth daily.  . calcium-vitamin D (OSCAL WITH D) 500-200 MG-UNIT tablet Take 1 tablet by mouth.  . EPIPEN 2-PAK 0.3 MG/0.3ML SOAJ injection   . glipiZIDE (GLIPIZIDE XL) 5 MG 24 hr tablet Take 1 tablet (5 mg total) by mouth daily.  Marland Kitchen loratadine (CLARITIN) 10 MG tablet Take 10 mg by mouth daily. otc  . losartan (COZAAR) 25 MG tablet Take 1 tablet (25 mg total) by mouth daily.  . metFORMIN (GLUCOPHAGE) 500 MG tablet TAKE ONE TABLET BY MOUTH TWICE DAILY  . Multiple Vitamin (MULTIVITAMIN) capsule Take 1 capsule by mouth daily.  . nitrofurantoin, macrocrystal-monohydrate, (MACROBID) 100 MG capsule Take 1 capsule (100 mg total) by mouth 2 (two) times daily.  Glory Rosebush ULTRA test strip USE ONE STRIP TO CHECK GLUCOSE ONCE DAILY  . pioglitazone (ACTOS) 15 MG tablet Take 1 tablet (15 mg total) by mouth daily.  . simvastatin (ZOCOR) 40 MG tablet Take 1 tablet by mouth once daily   Current Facility-Administered Medications for the 10/31/19 encounter (Office Visit) with Juline Patch, MD  Medication  . albuterol (PROVENTIL) (2.5 MG/3ML) 0.083% nebulizer solution 2.5 mg  . ipratropium-albuterol (DUONEB) 0.5-2.5 (3) MG/3ML nebulizer solution 3 mL    PHQ 2/9 Scores 10/31/2019 10/02/2019 09/07/2019 09/01/2019  PHQ - 2 Score 0 0 0 0  PHQ- 9 Score 0 0 0 0    BP Readings from Last 3 Encounters:  10/31/19 120/62  10/02/19 124/64  09/07/19 128/82    Physical Exam Vitals signs and nursing note reviewed.  Constitutional:      Appearance: She is well-developed.  HENT:     Head: Normocephalic.     Right Ear: Tympanic membrane, ear canal and external ear normal.      Left Ear: Tympanic membrane, ear canal and external ear normal.  Eyes:     General: Lids are everted, no foreign bodies appreciated. No scleral icterus.       Left eye: No foreign body or hordeolum.     Conjunctiva/sclera: Conjunctivae normal.     Right eye: Right conjunctiva is not injected.     Left eye: Left conjunctiva is not injected.     Pupils: Pupils are equal, round, and reactive to light.  Neck:     Musculoskeletal: Normal range of motion and neck supple.     Thyroid: No thyromegaly.     Vascular: No JVD.     Trachea: No tracheal deviation.  Cardiovascular:     Rate and Rhythm: Normal rate and regular rhythm.     Heart sounds: Normal heart sounds. No murmur. No friction rub. No gallop.   Pulmonary:     Effort: Pulmonary effort is normal. No respiratory distress.     Breath sounds: Normal breath sounds. No wheezing, rhonchi or  rales.  Abdominal:     General: Bowel sounds are normal.     Palpations: Abdomen is soft. There is no mass.     Tenderness: There is no abdominal tenderness. There is no guarding or rebound.  Musculoskeletal: Normal range of motion.        General: No tenderness.  Lymphadenopathy:     Cervical: No cervical adenopathy.  Skin:    General: Skin is warm.     Findings: No rash.  Neurological:     Mental Status: She is alert and oriented to person, place, and time.     Cranial Nerves: No cranial nerve deficit.     Sensory: No sensory deficit.     Motor: No weakness.     Deep Tendon Reflexes: Reflexes normal.  Psychiatric:        Mood and Affect: Mood is not anxious or depressed.     Wt Readings from Last 3 Encounters:  10/31/19 185 lb (83.9 kg)  10/02/19 183 lb (83 kg)  09/07/19 186 lb (84.4 kg)    BP 120/62   Pulse 76   Ht 5\' 3"  (1.6 m)   Wt 185 lb (83.9 kg)   BMI 32.77 kg/m   Assessment and Plan: . 1. DDD (degenerative disc disease), cervical Patient presents with left arm pain that patient relates that she had a remote accident  falling out of bed a year ago.  However this pain seems to be more related to a referred nature rather than a direct arm pain situation. - DG Cervical Spine Complete; Future  2. Cervical radiculopathy Neurologic exam is unremarkable but there is some spasm of the left trapezius.  We will initiate some prednisone 10 mg once a day because of her diabetes we will keep her low-dose I have refilled her meloxicam 15 mg once a day.  I have also given her some tramadol to take every 8 hours as needed for severe pain but patient may want to save this mostly for hour of sleep.  We will obtain an x-ray of her neck to see the degree of degenerative changes.  Our next step will be to refer to orthopedics and possibly involved Dr. Jana Hakim. - DG Cervical Spine Complete; Future

## 2019-10-31 NOTE — Patient Instructions (Signed)
Cervical Radiculopathy  Cervical radiculopathy happens when a nerve in the neck (a cervical nerve) is pinched or bruised. This condition can happen because of an injury to the cervical spine (vertebrae) in the neck, or as part of the normal aging process. Pressure on the cervical nerves can cause pain or numbness that travels from the neck all the way down into the arm and fingers. Usually, this condition gets better with rest. Treatment may be needed if the condition does not improve. What are the causes? This condition may be caused by:  A neck injury.  A bulging (herniated) disk.  Muscle spasms.  Muscle tightness in the neck because of overuse.  Arthritis.  Breakdown or degeneration in the bones and joints of the spine (spondylosis) due to aging.  Bone spurs that may develop near the cervical nerves. What are the signs or symptoms? Symptoms of this condition include:  Pain. The pain may travel from the neck to the arm and hand. The pain can be severe or irritating. It may be worse when you move your neck.  Numbness or tingling in your arm or hand.  Weakness in the affected arm and hand, in severe cases. How is this diagnosed? This condition may be diagnosed based on your symptoms, your medical history, and a physical exam. You may also have tests, including:  X-rays.  A CT scan.  An MRI.  An electromyogram (EMG).  Nerve conduction tests. How is this treated? In many cases, treatment is not needed for this condition. With rest, the condition usually gets better over time. If treatment is needed, options may include:  Wearing a soft neck collar (cervical collar) for short periods of time, as told by your health care provider.  Doing physical therapy to strengthen your neck muscles.  Taking medicines, such as NSAIDs or oral corticosteroids.  Having spinal injections, in severe cases.  Having surgery. This may be needed if other treatments do not help. Different types  of surgery may be done depending on the cause of this condition. Follow these instructions at home: If you have a cervical collar:  Wear it as told by your health care provider. Remove it only as told by your health care provider.  Ask your health care provider if you can remove the collar for cleaning and bathing. If you are allowed to remove the collar for cleaning or bathing: ? Follow instructions from your health care provider about how to remove the collar safely. ? Clean the collar by wiping it with mild soap and water and drying it completely. ? Take out any removable pads in the collar every 1-2 days, and wash them by hand with soap and water. Let them air-dry completely before you put them back in the collar. ? Check your skin under the collar for irritation or sores. If you see any, tell your health care provider. Managing pain      Take over-the-counter and prescription medicines only as told by your health care provider.  If directed, put ice on the affected area. ? If you have a soft neck collar, remove it as told by your health care provider. ? Put ice in a plastic bag. ? Place a towel between your skin and the bag. ? Leave the ice on for 20 minutes, 2-3 times a day.  If applying ice does not help, you can try using heat. Use the heat source that your health care provider recommends, such as a moist heat pack or a heating pad. ?  Place a towel between your skin and the heat source. ? Leave the heat on for 20-30 minutes. ? Remove the heat if your skin turns bright red. This is especially important if you are unable to feel pain, heat, or cold. You may have a greater risk of getting burned.  Try a gentle neck and shoulder massage to help relieve symptoms. Activity  Rest as needed.  Return to your normal activities as told by your health care provider. Ask your health care provider what activities are safe for you.  Do stretching and strengthening exercises as told by  your health care provider or physical therapist.  Do not lift anything that is heavier than 10 lb (4.5 kg) until your health care provider tells you that it is safe. General instructions  Use a flat pillow when you sleep.  Do not drive while wearing a cervical collar. If you do not have a cervical collar, ask your health care provider if it is safe to drive while your neck heals.  Ask your health care provider if the medicine prescribed to you requires you to avoid driving or using heavy machinery.  Do not use any products that contain nicotine or tobacco, such as cigarettes, e-cigarettes, and chewing tobacco. These can delay healing. If you need help quitting, ask your health care provider.  Keep all follow-up visits as told by your health care provider. This is important. Contact a health care provider if:  Your condition does not improve with treatment. Get help right away if:  Your pain gets much worse and cannot be controlled with medicines.  You have weakness or numbness in your hand, arm, face, or leg.  You have a high fever.  You have a stiff, rigid neck.  You lose control of your bowels or your bladder (have incontinence).  You have trouble with walking, balance, or speaking. Summary  Cervical radiculopathy happens when a nerve in the neck is pinched or bruised.  A nerve can get pinched from a bulging disk, arthritis, muscle spasms, or an injury to the neck.  Symptoms include pain, tingling, or numbness radiating from the neck into the arm or hand. Weakness can also occur in severe cases.  Treatment may include rest, wearing a cervical collar, and physical therapy. Medicines may be prescribed to help with pain. In severe cases, injections or surgery may be needed. This information is not intended to replace advice given to you by your health care provider. Make sure you discuss any questions you have with your health care provider. Document Released: 08/11/2001  Document Revised: 10/07/2018 Document Reviewed: 10/07/2018 Elsevier Patient Education  2020 [employer].

## 2019-11-14 ENCOUNTER — Telehealth: Payer: Self-pay

## 2019-11-14 NOTE — Telephone Encounter (Signed)
Confirmed appointment with patient. klh °

## 2019-11-16 ENCOUNTER — Other Ambulatory Visit: Payer: Self-pay

## 2019-11-16 ENCOUNTER — Encounter: Payer: Self-pay | Admitting: Internal Medicine

## 2019-11-16 ENCOUNTER — Ambulatory Visit: Payer: Medicare Other | Admitting: Internal Medicine

## 2019-11-16 VITALS — BP 151/84 | HR 81 | Resp 16 | Ht 63.0 in | Wt 185.0 lb

## 2019-11-16 DIAGNOSIS — J4520 Mild intermittent asthma, uncomplicated: Secondary | ICD-10-CM

## 2019-11-16 DIAGNOSIS — J301 Allergic rhinitis due to pollen: Secondary | ICD-10-CM

## 2019-11-16 DIAGNOSIS — I10 Essential (primary) hypertension: Secondary | ICD-10-CM | POA: Diagnosis not present

## 2019-11-16 NOTE — Progress Notes (Signed)
[facility] [place], [place] [address]  Pulmonary Sleep Medicine   Office Visit Note  Patient Name: Lindsey George DOB: 1951-05-02 MRN [id number]  Date of Service: 11/16/2019  Complaints/HPI: Patient is here for follow-up of asthma she states she has been under good control has not had any flareups.  She is also not really been using any inhalers.  She is off the allergy shots has not had any issues.  She did note today that her blood pressure is elevated it was checked twice and still was elevated.  She is on a nonsteroidal anti-inflammatory and also is on prednisone which may very well be raising her blood pressure.  I will defer this to primary care team for assessment currently is supposed to be on losartan also.  ROS  General: (-) fever, (-) chills, (-) night sweats, (-) weakness Skin: (-) rashes, (-) itching,. Eyes: (-) visual changes, (-) redness, (-) itching. Nose and Sinuses: (-) nasal stuffiness or itchiness, (-) postnasal drip, (-) nosebleeds, (-) sinus trouble. Mouth and Throat: (-) sore throat, (-) hoarseness. Neck: (-) swollen glands, (-) enlarged thyroid, (-) neck pain. Respiratory: - cough, (-) bloody sputum, + shortness of breath, - wheezing. Cardiovascular: - ankle swelling, (-) chest pain. Lymphatic: (-) lymph node enlargement. Neurologic: (-) numbness, (-) tingling. Psychiatric: (-) anxiety, (-) depression   Current Medication: Outpatient Encounter Medications as of 11/16/2019  Medication Sig Note  . albuterol (PROVENTIL HFA;VENTOLIN HFA) 108 (90 Base) MCG/ACT inhaler Inhale 2 puffs into the lungs every 6 (six) hours as needed for wheezing or shortness of breath.   Marland Kitchen albuterol (PROVENTIL) (2.5 MG/3ML) 0.083% nebulizer solution Take 3 mLs (2.5 mg total) by nebulization every 6 (six) hours as needed for wheezing or shortness of breath.   Marland Kitchen alendronate (FOSAMAX) 70 MG tablet Take 1 tablet (70 mg total) by mouth every 7 (seven) days.  Take with a full glass of water on an empty stomach.   Marland Kitchen aspirin 81 MG tablet Take 81 mg by mouth daily.   . calcium-vitamin D (OSCAL WITH D) 500-200 MG-UNIT tablet Take 1 tablet by mouth.   . EPIPEN 2-PAK 0.3 MG/0.3ML SOAJ injection  03/20/2016: Received from: External Pharmacy  . glipiZIDE (GLIPIZIDE XL) 5 MG 24 hr tablet Take 1 tablet (5 mg total) by mouth daily.   Marland Kitchen loratadine (CLARITIN) 10 MG tablet Take 10 mg by mouth daily. otc   . losartan (COZAAR) 25 MG tablet Take 1 tablet (25 mg total) by mouth daily.   . meloxicam (MOBIC) 15 MG tablet Take 1 tablet (15 mg total) by mouth daily.   . meloxicam (MOBIC) 15 MG tablet Take 1 tablet (15 mg total) by mouth daily.   . metFORMIN (GLUCOPHAGE) 500 MG tablet TAKE ONE TABLET BY MOUTH TWICE DAILY   . Multiple Vitamin (MULTIVITAMIN) capsule Take 1 capsule by mouth daily.   Glory Rosebush ULTRA test strip USE ONE STRIP TO CHECK GLUCOSE ONCE DAILY   . pioglitazone (ACTOS) 15 MG tablet Take 1 tablet (15 mg total) by mouth daily.   . predniSONE (DELTASONE) 10 MG tablet Take 1 tablet (10 mg total) by mouth daily with breakfast.   . simvastatin (ZOCOR) 40 MG tablet Take 1 tablet by mouth once daily   . [DISCONTINUED] nitrofurantoin, macrocrystal-monohydrate, (MACROBID) 100 MG capsule Take 1 capsule (100 mg total) by mouth 2 (two) times daily. (Patient not taking: Reported on 11/16/2019)    Facility-Administered Encounter Medications as of 11/16/2019  Medication  .  albuterol (PROVENTIL) (2.5 MG/3ML) 0.083% nebulizer solution 2.5 mg  . ipratropium-albuterol (DUONEB) 0.5-2.5 (3) MG/3ML nebulizer solution 3 mL    Surgical History: Past Surgical History:  Procedure Laterality Date  . ABDOMINAL HYSTERECTOMY    . BREAST BIOPSY Right 06/22/11   bx/clip-neg  . BREAST CYST ASPIRATION Left    neg  . COLONOSCOPY  2014   cleared for 5 yrs- [place] doc  . COLONOSCOPY WITH PROPOFOL N/A 03/25/2018   Procedure: COLONOSCOPY WITH PROPOFOL;  Surgeon: Manya Silvas, MD;   Location: [facility];  Service: Endoscopy;  Laterality: N/A;  . PARTIAL HYSTERECTOMY    . SPLENECTOMY      Medical History: Past Medical History:  Diagnosis Date  . Asthma   . Cancer ([place])    skin ca  . Chronic back pain   . Diabetes mellitus without complication ([place])   . Hyperlipidemia   . Hypertension   . Obesity   . Osteoporosis     Family History: Family History  Problem Relation Age of Onset  . Breast cancer Sister 36  . Cancer Sister   . Heart disease Sister   . Cancer Father   . Stroke Father   . Heart disease Sister     Social History: Social History   Socioeconomic History  . Marital status: Married    Spouse name: Not on file  . Number of children: 2  . Years of education: Not on file  . Highest education level: Associate degree: academic program  Occupational History  . Occupation: Retired  Tobacco Use  . Smoking status: Never Smoker  . Smokeless tobacco: Never Used  . Tobacco comment: smoking cessation materials not required  Substance and Sexual Activity  . Alcohol use: No    Alcohol/week: 0.0 standard drinks  . Drug use: No  . Sexual activity: Yes  Other Topics Concern  . Not on file  Social History Narrative  . Not on file   Social Determinants of Health   Financial Resource Strain:   . Difficulty of Paying Living Expenses: Not on file  Food Insecurity:   . Worried About Charity fundraiser in the Last Year: Not on file  . Ran Out of Food in the Last Year: Not on file  Transportation Needs:   . Lack of Transportation (Medical): Not on file  . Lack of Transportation (Non-Medical): Not on file  Physical Activity:   . Days of Exercise per Week: Not on file  . Minutes of Exercise per Session: Not on file  Stress: No Stress Concern Present  . Feeling of Stress : Only a little  Social Connections: Unknown  . Frequency of Communication with Friends and Family: More than three times a week  . Frequency of Social Gatherings with  Friends and Family: Three times a week  . Attends Religious Services: More than 4 times per year  . Active Member of Clubs or Organizations: No  . Attends Archivist Meetings: Never  . Marital Status: Not on file  Intimate Partner Violence:   . Fear of Current or Ex-Partner: Not on file  . Emotionally Abused: Not on file  . Physically Abused: Not on file  . Sexually Abused: Not on file    Vital Signs: Blood pressure (!) 151/84, pulse 81, resp. rate 16, height 5\' 3"  (1.6 m), weight 185 lb (83.9 kg), SpO2 95 %.  Examination: General Appearance: The patient is well-developed, well-nourished, and in no distress. Skin: Gross inspection of skin unremarkable.  Head: normocephalic, no gross deformities. Eyes: no gross deformities noted. ENT: ears appear grossly normal no exudates. Neck: Supple. No thyromegaly. No LAD. Respiratory: no rhonchi noted. Cardiovascular: Normal S1 and S2 without murmur or rub. Extremities: No cyanosis. pulses are equal. Neurologic: Alert and oriented. No involuntary movements.  LABS: Recent Results (from the past 2160 hour(s))  HgB A1c     Status: Abnormal   Collection Time: 09/01/19 10:03 AM  Result Value Ref Range   Hgb A1c MFr Bld 6.8 (H) 4.8 - 5.6 %    Comment:          Prediabetes: 5.7 - 6.4          Diabetes: >6.4          Glycemic control for adults with diabetes: <7.0    Est. average glucose Bld gHb Est-mCnc 148 mg/dL  POCT urinalysis dipstick     Status: Abnormal   Collection Time: 10/02/19  3:38 PM  Result Value Ref Range   Color, UA yellow    Clarity, UA cloudy    Glucose, UA Negative Negative   Bilirubin, UA negative    Ketones, UA negative    Spec Grav, UA 1.015 1.010 - 1.025   Blood, UA non hem trace    pH, UA 5.0 5.0 - 8.0   Protein, UA Positive (A) Negative    Comment: 2+   Urobilinogen, UA 0.2 0.2 or 1.0 E.U./dL   Nitrite, UA positive    Leukocytes, UA Moderate (2+) (A) Negative   Appearance yellow    Odor strong      Radiology: DG Cervical Spine Complete  Result Date: 11/01/2019 CLINICAL DATA:  Cervicalgia with left upper extremity radicular symptoms EXAM: CERVICAL SPINE - COMPLETE 4+ VIEW COMPARISON:  Report of prior cervical spine series August 30, 2013 available; images from that study not available FINDINGS: Frontal, lateral, open-mouth odontoid, and bilateral oblique views were obtained. There is no fracture or spondylolisthesis. Prevertebral soft tissues and predental space regions are normal. There is severe disc space narrowing at C4-5 and C5-6 with moderate disc space narrowing at C6-7 and C7-T1. There are prominent anterior osteophytes at C4, C5, and C6. There is facet hypertrophy with exit foraminal narrowing due to bony hypertrophy at C3-4, C4-5, C5-6, C6-7, and C7-T1 bilaterally. No erosive changes. Lung apices are clear. IMPRESSION: Multilevel arthropathy, most severe at C4-5 and C5-6. no fracture or spondylolisthesis. Electronically Signed   By: Lowella Grip III M.D.   On: 11/01/2019 08:27    No results found.  DG Cervical Spine Complete  Result Date: 11/01/2019 CLINICAL DATA:  Cervicalgia with left upper extremity radicular symptoms EXAM: CERVICAL SPINE - COMPLETE 4+ VIEW COMPARISON:  Report of prior cervical spine series August 30, 2013 available; images from that study not available FINDINGS: Frontal, lateral, open-mouth odontoid, and bilateral oblique views were obtained. There is no fracture or spondylolisthesis. Prevertebral soft tissues and predental space regions are normal. There is severe disc space narrowing at C4-5 and C5-6 with moderate disc space narrowing at C6-7 and C7-T1. There are prominent anterior osteophytes at C4, C5, and C6. There is facet hypertrophy with exit foraminal narrowing due to bony hypertrophy at C3-4, C4-5, C5-6, C6-7, and C7-T1 bilaterally. No erosive changes. Lung apices are clear. IMPRESSION: Multilevel arthropathy, most severe at C4-5 and C5-6. no  fracture or spondylolisthesis. Electronically Signed   By: Lowella Grip III M.D.   On: 11/01/2019 08:27      Assessment and Plan: Patient Active Problem List  Diagnosis Date Noted  . Morbid obesity ([place]) 03/29/2019  . Allergic rhinitis 01/19/2018  . Hx of adenomatous colonic polyps 01/14/2018  . Reactive airway disease without complication [id number]  . Back ache 05/22/2015  . Alimentary obesity 05/22/2015  . Type 2 diabetes mellitus with complication, without long-term current use of insulin ([place]) 04/12/2014  . Hypercholesterolemia 04/12/2014  . OP (osteoporosis) 04/12/2014    1. Asthma under control. Not requiring any inhalers has been stable no flareups no admissions we will continue with as needed inhalers as necessary.  Patient is under control right now 2. Allergic Rhinitis off shots doing well currently off the shots she will be continued on supportive care antihistamines as needed 3. Morbid Obesity work on diet and exercise she needs to continue with monitoring her progression with her weight closely 4. HTN states it is unusual for her to have elevated BP. She is on NSAID and prednisone this could be causing an elevation. She will follow up with her PCP  General Counseling: I have discussed the findings of the evaluation and examination with Marlowe Kays.  I have also discussed any further diagnostic evaluation thatmay be needed or ordered today. Kady verbalizes understanding of the findings of todays visit. We also reviewed her medications today and discussed drug interactions and side effects including but not limited excessive drowsiness and altered mental states. We also discussed that there is always a risk not just to her but also people around her. she has been encouraged to call the office with any questions or concerns that should arise related to todays visit.  No orders of the defined types were placed in this encounter.    Time spent: 41min  I have personally  obtained a history, examined the patient, evaluated laboratory and imaging results, formulated the assessment and plan and placed orders.    Allyne Gee, MD [facility] Pulmonary and Critical Care Sleep medicine

## 2019-11-16 NOTE — Patient Instructions (Signed)
Asthma, Adult ° °Asthma is a long-term (chronic) condition in which the airways get tight and narrow. The airways are the breathing passages that lead from the nose and mouth down into the lungs. A person with asthma will have times when symptoms get worse. These are called asthma attacks. They can cause coughing, whistling sounds when you breathe (wheezing), shortness of breath, and chest pain. They can make it hard to breathe. There is no cure for asthma, but medicines and lifestyle changes can help control it. °There are many things that can bring on an asthma attack or make asthma symptoms worse (triggers). Common triggers include: °· Mold. °· Dust. °· Cigarette smoke. °· Cockroaches. °· Things that can cause allergy symptoms (allergens). These include animal skin flakes (dander) and pollen from trees or grass. °· Things that pollute the air. These may include household cleaners, wood smoke, smog, or chemical odors. °· Cold air, weather changes, and wind. °· Crying or laughing hard. °· Stress. °· Certain medicines or drugs. °· Certain foods such as dried fruit, potato chips, and grape juice. °· Infections, such as a cold or the flu. °· Certain medical conditions or diseases. °· Exercise or tiring activities. °Asthma may be treated with medicines and by staying away from the things that cause asthma attacks. Types of medicines may include: °· Controller medicines. These help prevent asthma symptoms. They are usually taken every day. °· Fast-acting reliever or rescue medicines. These quickly relieve asthma symptoms. They are used as needed and provide short-term relief. °· Allergy medicines if your attacks are brought on by allergens. °· Medicines to help control the body's defense (immune) system. °Follow these instructions at home: °Avoiding triggers in your home °· Change your heating and air conditioning filter often. °· Limit your use of fireplaces and wood stoves. °· Get rid of pests (such as roaches and  mice) and their droppings. °· Throw away plants if you see mold on them. °· Clean your floors. Dust regularly. Use cleaning products that do not smell. °· Have someone vacuum when you are not home. Use a vacuum cleaner with a HEPA filter if possible. °· Replace carpet with wood, tile, or vinyl flooring. Carpet can trap animal skin flakes and dust. °· Use allergy-proof pillows, mattress covers, and box spring covers. °· Wash bed sheets and blankets every week in hot water. Dry them in a dryer. °· Keep your bedroom free of any triggers. °· Avoid pets and keep windows closed when things that cause allergy symptoms are in the air. °· Use blankets that are made of polyester or cotton. °· Clean bathrooms and kitchens with bleach. If possible, have someone repaint the walls in these rooms with mold-resistant paint. Keep out of the rooms that are being cleaned and painted. °· Wash your hands often with soap and water. If soap and water are not available, use hand sanitizer. °· Do not allow anyone to smoke in your home. °General instructions °· Take over-the-counter and prescription medicines only as told by your doctor. °? Talk with your doctor if you have questions about how or when to take your medicines. °? Make note if you need to use your medicines more often than usual. °· Do not use any products that contain nicotine or tobacco, such as cigarettes and e-cigarettes. If you need help quitting, ask your doctor. °· Stay away from secondhand smoke. °· Avoid doing things outdoors when allergen counts are high and when air quality is low. °· Wear a ski mask   when doing outdoor activities in the winter. The mask should cover your nose and mouth. Exercise indoors on cold days if you can. °· Warm up before you exercise. Take time to cool down after exercise. °· Use a peak flow meter as told by your doctor. A peak flow meter is a tool that measures how well the lungs are working. °· Keep track of the peak flow meter's readings.  Write them down. °· Follow your asthma action plan. This is a written plan for taking care of your asthma and treating your attacks. °· Make sure you get all the shots (vaccines) that your doctor recommends. Ask your doctor about a flu shot and a pneumonia shot. °· Keep all follow-up visits as told by your doctor. This is important. °Contact a doctor if: °· You have wheezing, shortness of breath, or a cough even while taking medicine to prevent attacks. °· The mucus you cough up (sputum) is thicker than usual. °· The mucus you cough up changes from clear or white to yellow, green, gray, or bloody. °· You have problems from the medicine you are taking, such as: °? A rash. °? Itching. °? Swelling. °? Trouble breathing. °· You need reliever medicines more than 2-3 times a week. °· Your peak flow reading is still at 50-79% of your personal best after following the action plan for 1 hour. °· You have a fever. °Get help right away if: °· You seem to be worse and are not responding to medicine during an asthma attack. °· You are short of breath even at rest. °· You get short of breath when doing very little activity. °· You have trouble eating, drinking, or talking. °· You have chest pain or tightness. °· You have a fast heartbeat. °· Your lips or fingernails start to turn blue. °· You are light-headed or dizzy, or you faint. °· Your peak flow is less than 50% of your personal best. °· You feel too tired to breathe normally. °Summary °· Asthma is a long-term (chronic) condition in which the airways get tight and narrow. An asthma attack can make it hard to breathe. °· Asthma cannot be cured, but medicines and lifestyle changes can help control it. °· Make sure you understand how to avoid triggers and how and when to use your medicines. °This information is not intended to replace advice given to you by your health care provider. Make sure you discuss any questions you have with your health care provider. °Document  Released: 05/04/2008 Document Revised: 01/19/2019 Document Reviewed: 12/21/2016 °Elsevier Patient Education © 2020 [employer] ° °

## 2019-11-27 ENCOUNTER — Other Ambulatory Visit: Payer: Self-pay | Admitting: Family Medicine

## 2019-12-13 ENCOUNTER — Other Ambulatory Visit: Payer: Self-pay

## 2020-01-10 ENCOUNTER — Ambulatory Visit: Payer: Medicare PPO | Admitting: Family Medicine

## 2020-01-10 ENCOUNTER — Other Ambulatory Visit: Payer: Self-pay

## 2020-01-10 ENCOUNTER — Encounter: Payer: Self-pay | Admitting: Family Medicine

## 2020-01-10 VITALS — BP 118/62 | HR 72 | Temp 98.9°F | Ht 63.0 in | Wt 183.0 lb

## 2020-01-10 DIAGNOSIS — N3001 Acute cystitis with hematuria: Secondary | ICD-10-CM

## 2020-01-10 DIAGNOSIS — N3090 Cystitis, unspecified without hematuria: Secondary | ICD-10-CM

## 2020-01-10 LAB — POCT URINALYSIS DIPSTICK
Bilirubin, UA: NEGATIVE
Glucose, UA: NEGATIVE
Ketones, UA: NEGATIVE
Nitrite, UA: POSITIVE
Protein, UA: POSITIVE — AB
Spec Grav, UA: 1.02 (ref 1.010–1.025)
Urobilinogen, UA: 0.2 E.U./dL
pH, UA: 6 (ref 5.0–8.0)

## 2020-01-10 MED ORDER — CEPHALEXIN 500 MG PO CAPS: 500.0000 mg | ORAL_CAPSULE | Freq: Four times a day (QID) | ORAL | 0 refills | Status: DC

## 2020-01-10 NOTE — Progress Notes (Signed)
Date:  01/10/2020   Name:  Lindsey George   DOB:  05/23/1951   MRN:  [id number]   Chief Complaint: Urinary Tract Infection (passing blood in urine and having some abd pressure)  Urinary Tract Infection  This is a chronic problem. The current episode started more than 1 year ago. The problem occurs intermittently. The problem has been resolved. The quality of the pain is described as burning. The pain is at a severity of 1/10. There has been no fever. Associated symptoms include frequency, hematuria and urgency. Pertinent negatives include no chills, discharge, flank pain, hesitancy, nausea, sweats or vomiting. She has tried antibiotics for the symptoms. The treatment provided moderate relief. There is no history of catheterization, kidney stones, recurrent UTIs, a single kidney, urinary stasis or a urological procedure.    Lab Results  Component Value Date   CREATININE 0.57 03/29/2019   BUN 15 03/29/2019   NA 141 03/29/2019   K 4.6 03/29/2019   CL 98 03/29/2019   CO2 23 03/29/2019   Lab Results  Component Value Date   CHOL 130 03/29/2019   HDL 49 03/29/2019   LDLCALC 60 03/29/2019   TRIG 104 03/29/2019   CHOLHDL 2.9 09/19/2018   No results found for: TSH Lab Results  Component Value Date   HGBA1C 6.8 (H) 09/01/2019     Review of Systems  Constitutional: Negative.  Negative for chills, fatigue, fever and unexpected weight change.  HENT: Negative for congestion, ear discharge, ear pain, rhinorrhea, sinus pressure, sneezing and sore throat.   Eyes: Negative for photophobia, pain, discharge, redness and itching.  Respiratory: Negative for cough, shortness of breath, wheezing and stridor.   Gastrointestinal: Negative for abdominal pain, blood in stool, constipation, diarrhea, nausea and vomiting.  Endocrine: Negative for cold intolerance, heat intolerance, polydipsia, polyphagia and polyuria.  Genitourinary: Positive for frequency, hematuria and urgency. Negative for  dysuria, flank pain, hesitancy, menstrual problem, pelvic pain, vaginal bleeding and vaginal discharge.  Musculoskeletal: Negative for arthralgias, back pain and myalgias.  Skin: Negative for rash.  Allergic/Immunologic: Negative for environmental allergies and food allergies.  Neurological: Negative for dizziness, weakness, light-headedness, numbness and headaches.  Hematological: Negative for adenopathy. Does not bruise/bleed easily.  Psychiatric/Behavioral: Negative for dysphoric mood. The patient is not nervous/anxious.     Patient Active Problem List   Diagnosis Date Noted  . Morbid obesity ([place]) 03/29/2019  . Allergic rhinitis 01/19/2018  . Hx of adenomatous colonic polyps 01/14/2018  . Reactive airway disease without complication [id number]  . Back ache 05/22/2015  . Alimentary obesity 05/22/2015  . Type 2 diabetes mellitus with complication, without long-term current use of insulin ([place]) 04/12/2014  . Hypercholesterolemia 04/12/2014  . OP (osteoporosis) 04/12/2014    Allergies  Allergen Reactions  . Codeine Other (See Comments)    Past Surgical History:  Procedure Laterality Date  . ABDOMINAL HYSTERECTOMY    . BREAST BIOPSY Right 06/22/11   bx/clip-neg  . BREAST CYST ASPIRATION Left    neg  . COLONOSCOPY  2014   cleared for 5 yrs- [place] doc  . COLONOSCOPY WITH PROPOFOL N/A 03/25/2018   Procedure: COLONOSCOPY WITH PROPOFOL;  Surgeon: Manya Silvas, MD;  Location: [facility] ENDOSCOPY;  Service: Endoscopy;  Laterality: N/A;  . PARTIAL HYSTERECTOMY    . SPLENECTOMY      Social History   Tobacco Use  . Smoking status: Never Smoker  . Smokeless tobacco: Never Used  . Tobacco comment: smoking cessation materials not required  Substance  Use Topics  . Alcohol use: No    Alcohol/week: 0.0 standard drinks  . Drug use: No     Medication list has been reviewed and updated.  Current Meds  Medication Sig  . albuterol (PROVENTIL HFA;VENTOLIN HFA) 108 (90 Base) MCG/ACT  inhaler Inhale 2 puffs into the lungs every 6 (six) hours as needed for wheezing or shortness of breath.  Marland Kitchen albuterol (PROVENTIL) (2.5 MG/3ML) 0.083% nebulizer solution Take 3 mLs (2.5 mg total) by nebulization every 6 (six) hours as needed for wheezing or shortness of breath.  Marland Kitchen alendronate (FOSAMAX) 70 MG tablet Take 1 tablet (70 mg total) by mouth every 7 (seven) days. Take with a full glass of water on an empty stomach.  Marland Kitchen aspirin 81 MG tablet Take 81 mg by mouth daily.  . calcium-vitamin D (OSCAL WITH D) 500-200 MG-UNIT tablet Take 1 tablet by mouth.  . EPIPEN 2-PAK 0.3 MG/0.3ML SOAJ injection   . glipiZIDE (GLIPIZIDE XL) 5 MG 24 hr tablet Take 1 tablet (5 mg total) by mouth daily.  Marland Kitchen loratadine (CLARITIN) 10 MG tablet Take 10 mg by mouth daily. otc  . losartan (COZAAR) 25 MG tablet Take 1 tablet (25 mg total) by mouth daily.  . metFORMIN (GLUCOPHAGE) 500 MG tablet TAKE ONE TABLET BY MOUTH TWICE DAILY  . Multiple Vitamin (MULTIVITAMIN) capsule Take 1 capsule by mouth daily.  Glory Rosebush ULTRA test strip USE 1 STRIP TO CHECK GLUCOSE ONCE DAILY  . pioglitazone (ACTOS) 15 MG tablet Take 1 tablet (15 mg total) by mouth daily.  . simvastatin (ZOCOR) 40 MG tablet Take 1 tablet by mouth once daily   Current Facility-Administered Medications for the 01/10/20 encounter (Office Visit) with Juline Patch, MD  Medication  . albuterol (PROVENTIL) (2.5 MG/3ML) 0.083% nebulizer solution 2.5 mg  . ipratropium-albuterol (DUONEB) 0.5-2.5 (3) MG/3ML nebulizer solution 3 mL    PHQ 2/9 Scores 01/10/2020 10/31/2019 10/02/2019 09/07/2019  PHQ - 2 Score 0 0 0 0  PHQ- 9 Score 0 0 0 0    BP Readings from Last 3 Encounters:  01/10/20 118/62  11/16/19 (!) 151/84  10/31/19 120/62    Physical Exam Vitals and nursing note reviewed.  Constitutional:      Appearance: She is well-developed.  HENT:     Head: Normocephalic.     Right Ear: Tympanic membrane, ear canal and external ear normal.     Left Ear:  Tympanic membrane, ear canal and external ear normal.     Nose: Nose normal.     Mouth/Throat:     Mouth: Mucous membranes are moist.  Eyes:     General: Lids are everted, no foreign bodies appreciated. No scleral icterus.       Left eye: No foreign body or hordeolum.     Conjunctiva/sclera: Conjunctivae normal.     Right eye: Right conjunctiva is not injected.     Left eye: Left conjunctiva is not injected.     Pupils: Pupils are equal, round, and reactive to light.  Neck:     Thyroid: No thyromegaly.     Vascular: No JVD.     Trachea: No tracheal deviation.  Cardiovascular:     Rate and Rhythm: Normal rate and regular rhythm.     Heart sounds: Normal heart sounds. No murmur. No friction rub. No gallop.   Pulmonary:     Effort: Pulmonary effort is normal. No respiratory distress.     Breath sounds: Normal breath sounds. No wheezing or rales.  Abdominal:     General: Bowel sounds are normal.     Palpations: Abdomen is soft. There is no hepatomegaly, splenomegaly or mass.     Tenderness: There is no abdominal tenderness. There is no guarding or rebound.  Musculoskeletal:        General: No tenderness. Normal range of motion.     Cervical back: Normal range of motion and neck supple.  Lymphadenopathy:     Cervical: No cervical adenopathy.  Skin:    General: Skin is warm.     Coloration: Skin is not jaundiced.     Findings: No rash.  Neurological:     Mental Status: She is alert and oriented to person, place, and time.     Cranial Nerves: No cranial nerve deficit.     Deep Tendon Reflexes: Reflexes normal.  Psychiatric:        Mood and Affect: Mood is not anxious or depressed.     Wt Readings from Last 3 Encounters:  01/10/20 183 lb (83 kg)  11/16/19 185 lb (83.9 kg)  10/31/19 185 lb (83.9 kg)    BP 118/62   Pulse 72   Temp 98.9 F (37.2 C) (Oral)   Ht 5\' 3"  (1.6 m)   Wt 183 lb (83 kg)   BMI 32.42 kg/m   Assessment and Plan: 1. Acute cystitis with  hematuria Acute.  Recurrent.  Last urinary tract infection was November 2020.  Stable.  Urinalysis by dipstick is positive for leukocytes and erythrocytes.  There is also positive for nitrite.  Will initiate cephalexin 500 mg 4 times a day for 3 days for uncomplicated UTI and refer to urology because of the recurrent nature of these infections as well as the hematuria. - POCT Urinalysis Dipstick - Ambulatory referral to Urology - cephALEXin (KEFLEX) 500 MG capsule; Take 1 capsule (500 mg total) by mouth 4 (four) times daily.  Dispense: 12 capsule; Refill: 0  2. Recurrent cystitis As noted above this is a recurrent episode and we will refer to urology for evaluation of hematuria as well as the recurrent infections. - Ambulatory referral to Urology - cephALEXin (KEFLEX) 500 MG capsule; Take 1 capsule (500 mg total) by mouth 4 (four) times daily.  Dispense: 12 capsule; Refill: 0

## 2020-02-05 ENCOUNTER — Other Ambulatory Visit: Payer: Self-pay

## 2020-02-05 DIAGNOSIS — N3020 Other chronic cystitis without hematuria: Secondary | ICD-10-CM

## 2020-02-06 ENCOUNTER — Ambulatory Visit: Payer: Medicare PPO | Admitting: Urology

## 2020-02-06 ENCOUNTER — Encounter: Payer: Self-pay | Admitting: Urology

## 2020-02-06 ENCOUNTER — Other Ambulatory Visit
Admission: RE | Admit: 2020-02-06 | Discharge: 2020-02-06 | Disposition: A | Discharge status: Home / Self Care | Payer: Medicare PPO | Attending: Urology | Admitting: Urology

## 2020-02-06 ENCOUNTER — Other Ambulatory Visit: Payer: Self-pay

## 2020-02-06 VITALS — BP 144/76 | HR 72 | Ht 63.0 in | Wt 183.0 lb

## 2020-02-06 DIAGNOSIS — N3020 Other chronic cystitis without hematuria: Secondary | ICD-10-CM | POA: Insufficient documentation

## 2020-02-06 DIAGNOSIS — N390 Urinary tract infection, site not specified: Secondary | ICD-10-CM

## 2020-02-06 LAB — URINALYSIS, COMPLETE (UACMP) WITH MICROSCOPIC
Bilirubin Urine: NEGATIVE
Glucose, UA: NEGATIVE mg/dL
Hgb urine dipstick: NEGATIVE
Ketones, ur: NEGATIVE mg/dL
Leukocytes,Ua: NEGATIVE
Nitrite: NEGATIVE
RBC / HPF: NONE SEEN RBC/hpf (ref 0–5)
Specific Gravity, Urine: 1.02 (ref 1.005–1.030)
pH: 6 (ref 5.0–8.0)

## 2020-02-06 LAB — BLADDER SCAN AMB NON-IMAGING

## 2020-02-06 NOTE — Patient Instructions (Signed)
Start cranberry tablets daily.   Urinary Tract Infection, Adult  A urinary tract infection (UTI) is an infection of any part of the urinary tract. The urinary tract includes the kidneys, ureters, bladder, and urethra. These organs make, store, and get rid of urine in the body. Your health care provider may use other names to describe the infection. An upper UTI affects the ureters and kidneys (pyelonephritis). A lower UTI affects the bladder (cystitis) and urethra (urethritis). What are the causes? Most urinary tract infections are caused by bacteria in your genital area, around the entrance to your urinary tract (urethra). These bacteria grow and cause inflammation of your urinary tract. What increases the risk? You are more likely to develop this condition if:  You have a urinary catheter that stays in place (indwelling).  You are not able to control when you urinate or have a bowel movement (you have incontinence).  You are female and you: ? Use a spermicide or diaphragm for birth control. ? Have low estrogen levels. ? Are pregnant.  You have certain genes that increase your risk (genetics).  You are sexually active.  You take antibiotic medicines.  You have a condition that causes your flow of urine to slow down, such as: ? An enlarged prostate, if you are female. ? Blockage in your urethra (stricture). ? A kidney stone. ? A nerve condition that affects your bladder control (neurogenic bladder). ? Not getting enough to drink, or not urinating often.  You have certain medical conditions, such as: ? Diabetes. ? A weak disease-fighting system (immunesystem). ? Sickle cell disease. ? Gout. ? Spinal cord injury. What are the signs or symptoms? Symptoms of this condition include:  Needing to urinate right away (urgently).  Frequent urination or passing small amounts of urine frequently.  Pain or burning with urination.  Blood in the urine.  Urine that smells bad or  unusual.  Trouble urinating.  Cloudy urine.  Vaginal discharge, if you are female.  Pain in the abdomen or the lower back. You may also have:  Vomiting or a decreased appetite.  Confusion.  Irritability or tiredness.  A fever.  Diarrhea. The first symptom in older adults may be confusion. In some cases, they may not have any symptoms until the infection has worsened. How is this diagnosed? This condition is diagnosed based on your medical history and a physical exam. You may also have other tests, including:  Urine tests.  Blood tests.  Tests for sexually transmitted infections (STIs). If you have had more than one UTI, a cystoscopy or imaging studies may be done to determine the cause of the infections. How is this treated? Treatment for this condition includes:  Antibiotic medicine.  Over-the-counter medicines to treat discomfort.  Drinking enough water to stay hydrated. If you have frequent infections or have other conditions such as a kidney stone, you may need to see a health care provider who specializes in the urinary tract (urologist). In rare cases, urinary tract infections can cause sepsis. Sepsis is a life-threatening condition that occurs when the body responds to an infection. Sepsis is treated in the hospital with IV antibiotics, fluids, and other medicines. Follow these instructions at home:  Medicines  Take over-the-counter and prescription medicines only as told by your health care provider.  If you were prescribed an antibiotic medicine, take it as told by your health care provider. Do not stop using the antibiotic even if you start to feel better. General instructions  Make sure you: ?  Empty your bladder often and completely. Do not hold urine for long periods of time. ? Empty your bladder after sex. ? Wipe from front to back after a bowel movement if you are female. Use each tissue one time when you wipe.  Drink enough fluid to keep your urine  pale yellow.  Keep all follow-up visits as told by your health care provider. This is important. Contact a health care provider if:  Your symptoms do not get better after 1-2 days.  Your symptoms go away and then return. Get help right away if you have:  Severe pain in your back or your lower abdomen.  A fever.  Nausea or vomiting. Summary  A urinary tract infection (UTI) is an infection of any part of the urinary tract, which includes the kidneys, ureters, bladder, and urethra.  Most urinary tract infections are caused by bacteria in your genital area, around the entrance to your urinary tract (urethra).  Treatment for this condition often includes antibiotic medicines.  If you were prescribed an antibiotic medicine, take it as told by your health care provider. Do not stop using the antibiotic even if you start to feel better.  Keep all follow-up visits as told by your health care provider. This is important. This information is not intended to replace advice given to you by your health care provider. Make sure you discuss any questions you have with your health care provider. Document Revised: 11/03/2018 Document Reviewed: 05/26/2018 Elsevier Patient Education  2020 [employer].

## 2020-02-06 NOTE — Progress Notes (Signed)
02/06/20 10:59 AM   Jenne Pane 1951-10-21 [id number]  CC: rUTIs  HPI: I saw [name] in urology clinic today for evaluation of recurrent UTIs.  She is a 69 year old female with diabetes who has had 2 suspected UTIs over the last few months.  Dipstick urinalysis on 10/02/2019 and 01/10/2020 for both suspicious for UTI with moderate leukocytes and nitrite positive.  Microscopic analysis was not performed and these were not sent for culture.  Her symptoms with both UTIs were feeling of incomplete emptying and urgency.  She also had some gross hematuria with her second UTI.  Her symptoms resolved with Keflex.  She denies any symptoms today.  Urinalysis is benign with no leukocytes, nitrite negative, 0-5 WBCs, 0 RBCs, few bacteria.  She is a never smoker and denies any other carcinogenic exposures.  She is no longer sexually active.  She has not had UTIs since she was in her 45s.  Most recent HgA1c 6.8 in October 2020. PVR 39ml.  No prior cross sectional imaging to review.  PMH: Past Medical History:  Diagnosis Date  . Asthma   . Cancer ([place])    skin ca  . Chronic back pain   . Diabetes mellitus without complication ([place])   . Hyperlipidemia   . Hypertension   . Obesity   . Osteoporosis     Surgical History: Past Surgical History:  Procedure Laterality Date  . ABDOMINAL HYSTERECTOMY    . BREAST BIOPSY Right 06/22/11   bx/clip-neg  . BREAST CYST ASPIRATION Left    neg  . COLONOSCOPY  2014   cleared for 5 yrs- [place] doc  . COLONOSCOPY WITH PROPOFOL N/A 03/25/2018   Procedure: COLONOSCOPY WITH PROPOFOL;  Surgeon: Manya Silvas, MD;  Location: [facility] ENDOSCOPY;  Service: Endoscopy;  Laterality: N/A;  . PARTIAL HYSTERECTOMY    . SPLENECTOMY      Family History: Family History  Problem Relation Age of Onset  . Breast cancer Sister 75  . Cancer Sister   . Heart disease Sister   . Cancer Father   . Stroke Father   . Heart disease Sister     Social History:   reports that she has never smoked. She has never used smokeless tobacco. She reports that she does not drink alcohol or use drugs.  Physical Exam: BP (!) 144/76 (BP Location: Left Arm, Patient Position: Sitting, Cuff Size: Normal)   Pulse 72   Ht 5\' 3"  (1.6 m)   Wt 183 lb (83 kg)   BMI 32.42 kg/m    Constitutional:  Alert and oriented, No acute distress. Cardiovascular: No clubbing, cyanosis, or edema. Respiratory: Normal respiratory effort, no increased work of breathing. GI: Abdomen is soft, nontender, nondistended, no abdominal masses Lymph: No cervical or inguinal lymphadenopathy. Skin: No rashes, bruises or suspicious lesions. Neurologic: Grossly intact, no focal deficits, moving all 4 extremities. Psychiatric: Normal mood and affect.  Laboratory Data: Reviewed, see HPI  Assessment & Plan:   In summary, she is a 69 year old female with diabetes with 2 urine samples suspicious for UTI over the last 5 months.  Unfortunately, these were not sent for culture.  Urinalysis is benign today and she is asymptomatic.  She does not warrant hematuria work-up for dipstick positive blood at time of an active UTI.  We discussed the evaluation and treatment of patients with recurrent UTIs at length.  We specifically discussed the differences between asymptomatic bacteriuria and true urinary tract infection.  We discussed the AUA definition of  recurrent UTI of at least 2 culture proven symptomatic acute cystitis episodes in a 66-month period, or 3 within a 1 year period.  We discussed the importance of culture directed antibiotic treatment, and antibiotic stewardship.  First-line therapy includes nitrofurantoin(5 days), Bactrim(3 days), or fosfomycin(3 g single dose).  Possible etiologies of recurrent infection include periurethral tissue atrophy in postmenopausal woman, constipation, sexual activity, incomplete emptying, anatomic abnormalities, and even genetic predisposition.  Finally, we discussed the  role of perineal hygiene, timed voiding, adequate hydration, topical vaginal estrogen, cranberry prophylaxis, and low-dose antibiotic prophylaxis.  Cranberry tablets daily for UTI prophylaxis, prevention strategies discussed Follow-up as needed  I spent 45 total minutes on the day of the encounter including pre-visit review of the medical record, face-to-face time with the patient, and post visit ordering of labs/imaging/tests.  Nickolas Madrid, MD 02/06/2020  [facility] [address], [place], [place] [address] [phone]

## 2020-02-23 ENCOUNTER — Other Ambulatory Visit: Payer: Self-pay | Admitting: Family Medicine

## 2020-02-23 DIAGNOSIS — M810 Age-related osteoporosis without current pathological fracture: Secondary | ICD-10-CM

## 2020-03-01 ENCOUNTER — Ambulatory Visit: Payer: Medicare Other | Admitting: Family Medicine

## 2020-03-06 ENCOUNTER — Other Ambulatory Visit: Payer: Self-pay

## 2020-03-06 ENCOUNTER — Ambulatory Visit: Payer: Medicare PPO | Admitting: Family Medicine

## 2020-03-06 ENCOUNTER — Encounter: Payer: Self-pay | Admitting: Family Medicine

## 2020-03-06 VITALS — BP 118/80 | HR 76 | Ht 63.0 in | Wt 180.0 lb

## 2020-03-06 DIAGNOSIS — E7800 Pure hypercholesterolemia, unspecified: Secondary | ICD-10-CM

## 2020-03-06 DIAGNOSIS — I8393 Asymptomatic varicose veins of bilateral lower extremities: Secondary | ICD-10-CM | POA: Insufficient documentation

## 2020-03-06 DIAGNOSIS — I10 Essential (primary) hypertension: Secondary | ICD-10-CM | POA: Insufficient documentation

## 2020-03-06 DIAGNOSIS — E119 Type 2 diabetes mellitus without complications: Secondary | ICD-10-CM

## 2020-03-06 DIAGNOSIS — M5412 Radiculopathy, cervical region: Secondary | ICD-10-CM

## 2020-03-06 DIAGNOSIS — S90425A Blister (nonthermal), left lesser toe(s), initial encounter: Secondary | ICD-10-CM

## 2020-03-06 MED ORDER — METFORMIN HCL 500 MG PO TABS: ORAL_TABLET | ORAL | 1 refills | Status: DC

## 2020-03-06 MED ORDER — SIMVASTATIN 40 MG PO TABS: ORAL_TABLET | ORAL | 1 refills | Status: DC

## 2020-03-06 MED ORDER — GLIPIZIDE ER 5 MG PO TB24: 5.0000 mg | ORAL_TABLET | Freq: Every day | ORAL | 1 refills | Status: DC

## 2020-03-06 MED ORDER — LOSARTAN POTASSIUM 25 MG PO TABS: 25.0000 mg | ORAL_TABLET | Freq: Every day | ORAL | 1 refills | Status: DC

## 2020-03-06 MED ORDER — PIOGLITAZONE HCL 15 MG PO TABS: 15.0000 mg | ORAL_TABLET | Freq: Every day | ORAL | 1 refills | Status: DC

## 2020-03-06 NOTE — Progress Notes (Signed)
Date:  03/06/2020   Name:  Lindsey George   DOB:  May 15, 1951   MRN:  [id number]   Chief Complaint: Diabetes (needs foot exam), Hypertension, Hyperlipidemia, and Osteoporosis  Diabetes She presents for her follow-up diabetic visit. She has type 2 diabetes mellitus. Her disease course has been stable. There are no hypoglycemic associated symptoms. Pertinent negatives for hypoglycemia include no dizziness, headaches, nervousness/anxiousness or sweats. There are no diabetic associated symptoms. Pertinent negatives for diabetes include no blurred vision, no chest pain, no fatigue, no foot paresthesias, no foot ulcerations, no polydipsia, no polyphagia, no polyuria, no visual change, no weakness and no weight loss. There are no hypoglycemic complications. Symptoms are stable. There are no diabetic complications. Pertinent negatives for diabetic complications include no CVA, PVD or retinopathy. Risk factors for coronary artery disease include diabetes mellitus, dyslipidemia, hypertension and obesity. Current diabetic treatment includes oral agent (triple therapy). She is compliant with treatment all of the time. Her weight is stable. She is following a generally healthy diet. She participates in exercise daily. Her home blood glucose trend is fluctuating minimally. Her breakfast blood glucose range is generally 140-180 mg/dl. An ACE inhibitor/angiotensin II receptor blocker is being taken. She does not see a podiatrist.Eye exam is current.  Hypertension This is a chronic problem. The current episode started more than 1 year ago. The problem has been waxing and waning since onset. The problem is controlled. Pertinent negatives include no anxiety, blurred vision, chest pain, headaches, malaise/fatigue, neck pain, orthopnea, palpitations, peripheral edema, PND, shortness of breath or sweats. There are no associated agents to hypertension. Risk factors for coronary artery disease include diabetes mellitus and  dyslipidemia. The current treatment provides moderate improvement. There are no compliance problems.  There is no history of angina, kidney disease, CAD/MI, CVA, heart failure, left ventricular hypertrophy, PVD or retinopathy. There is no history of chronic renal disease, a hypertension causing med or renovascular disease.  Hyperlipidemia This is a chronic problem. The current episode started more than 1 year ago. The problem is controlled. Recent lipid tests were reviewed and are normal. She has no history of chronic renal disease or diabetes. Factors aggravating her hyperlipidemia include thiazides. Pertinent negatives include no chest pain, focal sensory loss, focal weakness, leg pain, myalgias or shortness of breath. Current antihyperlipidemic treatment includes statins. The current treatment provides moderate improvement of lipids. There are no compliance problems.  Risk factors for coronary artery disease include dyslipidemia, diabetes mellitus and hypertension.  Arm Pain  The incident occurred more than 1 week ago. The pain is present in the left hand, left forearm, left fingers, left elbow and left wrist. The quality of the pain is described as aching. Pertinent negatives include no chest pain or numbness. She has tried NSAIDs and acetaminophen for the symptoms.    Lab Results  Component Value Date   CREATININE 0.57 03/29/2019   BUN 15 03/29/2019   NA 141 03/29/2019   K 4.6 03/29/2019   CL 98 03/29/2019   CO2 23 03/29/2019   Lab Results  Component Value Date   CHOL 130 03/29/2019   HDL 49 03/29/2019   LDLCALC 60 03/29/2019   TRIG 104 03/29/2019   CHOLHDL 2.9 09/19/2018   No results found for: TSH Lab Results  Component Value Date   HGBA1C 6.8 (H) 09/01/2019   Lab Results  Component Value Date   WBC 12.1 (H) 08/11/2018   HGB 12.9 08/11/2018   HCT 40.8 08/11/2018   MCV 80.5  08/11/2018   PLT 394 08/11/2018   Lab Results  Component Value Date   ALT 15 09/19/2018   AST 14  09/19/2018   ALKPHOS 59 09/19/2018   BILITOT 0.3 09/19/2018     Review of Systems  Constitutional: Negative.  Negative for chills, fatigue, fever, malaise/fatigue, unexpected weight change and weight loss.  HENT: Negative for congestion, ear discharge, ear pain, rhinorrhea, sinus pressure, sneezing and sore throat.   Eyes: Negative for blurred vision, photophobia, pain, discharge, redness and itching.  Respiratory: Negative for cough, shortness of breath, wheezing and stridor.   Cardiovascular: Negative for chest pain, palpitations, orthopnea and PND.  Gastrointestinal: Negative for abdominal pain, blood in stool, constipation, diarrhea, nausea and vomiting.  Endocrine: Negative for cold intolerance, heat intolerance, polydipsia, polyphagia and polyuria.  Genitourinary: Negative for dysuria, flank pain, frequency, hematuria, menstrual problem, pelvic pain, urgency, vaginal bleeding and vaginal discharge.  Musculoskeletal: Negative for arthralgias, back pain, myalgias and neck pain.  Skin: Negative for rash.  Allergic/Immunologic: Negative for environmental allergies and food allergies.  Neurological: Negative for dizziness, focal weakness, weakness, light-headedness, numbness and headaches.  Hematological: Negative for adenopathy. Does not bruise/bleed easily.  Psychiatric/Behavioral: Negative for dysphoric mood. The patient is not nervous/anxious.     Patient Active Problem List   Diagnosis Date Noted  . Morbid obesity ([place]) 03/29/2019  . Allergic rhinitis 01/19/2018  . Hx of adenomatous colonic polyps 01/14/2018  . Reactive airway disease without complication [id number]  . Back ache 05/22/2015  . Alimentary obesity 05/22/2015  . Type 2 diabetes mellitus with complication, without long-term current use of insulin ([place]) 04/12/2014  . Hypercholesterolemia 04/12/2014  . OP (osteoporosis) 04/12/2014    Allergies  Allergen Reactions  . Codeine Other (See Comments)    Past  Surgical History:  Procedure Laterality Date  . ABDOMINAL HYSTERECTOMY    . BREAST BIOPSY Right 06/22/11   bx/clip-neg  . BREAST CYST ASPIRATION Left    neg  . COLONOSCOPY  2014   cleared for 5 yrs- [facility] doc  . COLONOSCOPY WITH PROPOFOL N/A 03/25/2018   Procedure: COLONOSCOPY WITH PROPOFOL;  Surgeon: Manya Silvas, MD;  Location: [facility] ENDOSCOPY;  Service: Endoscopy;  Laterality: N/A;  . PARTIAL HYSTERECTOMY    . SPLENECTOMY      Social History   Tobacco Use  . Smoking status: Never Smoker  . Smokeless tobacco: Never Used  . Tobacco comment: smoking cessation materials not required  Substance Use Topics  . Alcohol use: No    Alcohol/week: 0.0 standard drinks  . Drug use: No     Medication list has been reviewed and updated.  Current Meds  Medication Sig  . albuterol (PROVENTIL HFA;VENTOLIN HFA) 108 (90 Base) MCG/ACT inhaler Inhale 2 puffs into the lungs every 6 (six) hours as needed for wheezing or shortness of breath.  Marland Kitchen albuterol (PROVENTIL) (2.5 MG/3ML) 0.083% nebulizer solution Take 3 mLs (2.5 mg total) by nebulization every 6 (six) hours as needed for wheezing or shortness of breath.  Marland Kitchen alendronate (FOSAMAX) 70 MG tablet TAKE 1 TABLET BY MOUTH ONCE A WEEK. TAKE WITH A FULL GLASS OF WATER ON AN EMPTY STOMACH.  Marland Kitchen aspirin 81 MG tablet Take 81 mg by mouth daily.  . calcium-vitamin D (OSCAL WITH D) 500-200 MG-UNIT tablet Take 1 tablet by mouth.  . Cholecalciferol (VITAMIN D3) 10 MCG (400 UNIT) CAPS   . EPIPEN 2-PAK 0.3 MG/0.3ML SOAJ injection   . glipiZIDE (GLIPIZIDE XL) 5 MG 24 hr tablet Take 1 tablet (  5 mg total) by mouth daily.  Marland Kitchen loratadine (CLARITIN) 10 MG tablet Take 10 mg by mouth daily. otc  . losartan (COZAAR) 25 MG tablet Take 1 tablet (25 mg total) by mouth daily.  . metFORMIN (GLUCOPHAGE) 500 MG tablet TAKE ONE TABLET BY MOUTH TWICE DAILY  . Multiple Vitamin (MULTIVITAMIN) capsule Take 1 capsule by mouth daily.  Glory Rosebush ULTRA test strip USE 1 STRIP TO CHECK  GLUCOSE ONCE DAILY  . pioglitazone (ACTOS) 15 MG tablet Take 1 tablet (15 mg total) by mouth daily.  . simvastatin (ZOCOR) 40 MG tablet Take 1 tablet by mouth once daily   Current Facility-Administered Medications for the 03/06/20 encounter (Office Visit) with Juline Patch, MD  Medication  . albuterol (PROVENTIL) (2.5 MG/3ML) 0.083% nebulizer solution 2.5 mg  . ipratropium-albuterol (DUONEB) 0.5-2.5 (3) MG/3ML nebulizer solution 3 mL    PHQ 2/9 Scores 03/06/2020 01/10/2020 10/31/2019 10/02/2019  PHQ - 2 Score 0 0 0 0  PHQ- 9 Score 0 0 0 0    BP Readings from Last 3 Encounters:  03/06/20 118/80  02/06/20 (!) 144/76  01/10/20 118/62    Physical Exam Vitals and nursing note reviewed.  Constitutional:      General: She is not in acute distress.    Appearance: She is not diaphoretic.  HENT:     Head: Normocephalic and atraumatic.     Right Ear: External ear normal.     Left Ear: External ear normal.     Nose: Nose normal.  Eyes:     General:        Right eye: No discharge.        Left eye: No discharge.     Conjunctiva/sclera: Conjunctivae normal.     Pupils: Pupils are equal, round, and reactive to light.  Neck:     Thyroid: No thyromegaly.     Vascular: No JVD.  Cardiovascular:     Rate and Rhythm: Normal rate and regular rhythm.     Pulses:          Dorsalis pedis pulses are 2+ on the right side and 2+ on the left side.       Posterior tibial pulses are 2+ on the right side and 2+ on the left side.     Heart sounds: Normal heart sounds. No murmur. No friction rub. No gallop.      Comments: Varicose veins noted Pulmonary:     Effort: Pulmonary effort is normal.     Breath sounds: Normal breath sounds.  Abdominal:     General: Bowel sounds are normal.     Palpations: Abdomen is soft. There is no mass.     Tenderness: There is no abdominal tenderness. There is no guarding.  Musculoskeletal:        General: Normal range of motion.     Cervical back: Normal range of  motion and neck supple.     Right foot: Normal range of motion. No bunion or foot drop.     Left foot: Normal range of motion. No bunion or foot drop.       Feet:  Feet:     Right foot:     Skin integrity: Blister present. No ulcer, skin breakdown, erythema, warmth, callus, dry skin or fissure.     Toenail Condition: Right toenails are normal.     Left foot:     Skin integrity: No ulcer, blister, skin breakdown, erythema, warmth, callus, dry skin or fissure.  Toenail Condition: Left toenails are normal.  Lymphadenopathy:     Cervical: No cervical adenopathy.  Skin:    General: Skin is warm and dry.  Neurological:     Mental Status: She is alert.     Deep Tendon Reflexes: Reflexes are normal and symmetric.     Wt Readings from Last 3 Encounters:  03/06/20 180 lb (81.6 kg)  02/06/20 183 lb (83 kg)  01/10/20 183 lb (83 kg)    BP 118/80   Pulse 76   Ht 5\' 3"  (1.6 m)   Wt 180 lb (81.6 kg)   BMI 31.89 kg/m   Assessment and Plan:  1. Type 2 diabetes mellitus without complication, without long-term current use of insulin (HCC) Chronic.  Controlled.  Stable.  Continuance of triple therapy with glipizide XL 5 mg once a day Metformin 500 mg 1 twice a day and pioglitazone 15 mg once a day as well.  Will check A1c and fasting glucose today.  Foot exam was noted to be normal except for a small blister on the second toe of left foot due to walking and ill fitting socks.  We will check an A1c, microalbuminuria and CMP today. - glipiZIDE (GLIPIZIDE XL) 5 MG 24 hr tablet; Take 1 tablet (5 mg total) by mouth daily.  Dispense: 90 tablet; Refill: 1 - metFORMIN (GLUCOPHAGE) 500 MG tablet; TAKE ONE TABLET BY MOUTH TWICE DAILY  Dispense: 180 tablet; Refill: 1 - pioglitazone (ACTOS) 15 MG tablet; Take 1 tablet (15 mg total) by mouth daily.  Dispense: 90 tablet; Refill: 1 - Microalbumin, urine - HgB A1c - Comprehensive Metabolic Panel (CMET)  2. Essential hypertension Chronic.  Controlled.   Stable.  Continue losartan 25 mg for control of blood pressure and protection of diabetic nephropathy.  Will check CMP to assess GFR. - losartan (COZAAR) 25 MG tablet; Take 1 tablet (25 mg total) by mouth daily.  Dispense: 90 tablet; Refill: 1 - Comprehensive Metabolic Panel (CMET)  3. Hypercholesterolemia Chronic.  Controlled.  Stable.  Continue meds simvastatin 40 mg once a day.  Will check lipid panel. - simvastatin (ZOCOR) 40 MG tablet; Take 1 tablet by mouth once daily  Dispense: 90 tablet; Refill: 1 - Lipid Panel With LDL/HDL Ratio  4. Cervical radiculopathy Chronic.  Radiculopathy: Into the left arm.  Reviewed patient's cervical spine films with severe degenerative changes at 2 levels and moderate at others.  Patient would like to try Tylenol prior to going to bed at night which is okay but has discussed the possibility of having Todd Monday to look at it in the future.  5. Blister of second toe of left foot, initial encounter Acute.  Superficial.  Patient had some ill fitting socks that caused a blister like area on the second toe of left foot plantar aspect.  This was dressed and patient was suggested to get posterior socks for control of this in the future.  6. Varicose veins noted bilateral lower extremities consistent with aging there are no thrombosed veins other than the smaller ones that are not superficial and asymptomatic.

## 2020-03-07 LAB — COMPREHENSIVE METABOLIC PANEL
ALT: 14 IU/L (ref 0–32)
AST: 17 IU/L (ref 0–40)
Albumin/Globulin Ratio: 1.8 (ref 1.2–2.2)
Albumin: 4.8 g/dL (ref 3.8–4.8)
Alkaline Phosphatase: 50 IU/L (ref 39–117)
BUN/Creatinine Ratio: 30 — ABNORMAL HIGH (ref 12–28)
BUN: 16 mg/dL (ref 8–27)
Bilirubin Total: 0.5 mg/dL (ref 0.0–1.2)
CO2: 24 mmol/L (ref 20–29)
Calcium: 10.4 mg/dL — ABNORMAL HIGH (ref 8.7–10.3)
Chloride: 99 mmol/L (ref 96–106)
Creatinine, Ser: 0.53 mg/dL — ABNORMAL LOW (ref 0.57–1.00)
GFR calc Af Amer: 111 mL/min/{1.73_m2} (ref >59)
GFR calc non Af Amer: 97 mL/min/{1.73_m2} (ref >59)
Globulin, Total: 2.6 g/dL (ref 1.5–4.5)
Glucose: 115 mg/dL — ABNORMAL HIGH (ref 65–99)
Potassium: 4.3 mmol/L (ref 3.5–5.2)
Sodium: 138 mmol/L (ref 134–144)
Total Protein: 7.4 g/dL (ref 6.0–8.5)

## 2020-03-07 LAB — LIPID PANEL WITH LDL/HDL RATIO
Cholesterol, Total: 128 mg/dL (ref 100–199)
HDL: 48 mg/dL (ref >39)
LDL Chol Calc (NIH): 58 mg/dL (ref 0–99)
LDL/HDL Ratio: 1.2 ratio (ref 0.0–3.2)
Triglycerides: 127 mg/dL (ref 0–149)
VLDL Cholesterol Cal: 22 mg/dL (ref 5–40)

## 2020-03-07 LAB — HEMOGLOBIN A1C
Est. average glucose Bld gHb Est-mCnc: 137 mg/dL
Hgb A1c MFr Bld: 6.4 % — ABNORMAL HIGH (ref 4.8–5.6)

## 2020-03-07 LAB — MICROALBUMIN, URINE: Microalbumin, Urine: 293.2 ug/mL

## 2020-04-22 ENCOUNTER — Other Ambulatory Visit: Payer: Self-pay

## 2020-04-22 ENCOUNTER — Encounter: Payer: Self-pay | Admitting: Family Medicine

## 2020-04-22 ENCOUNTER — Ambulatory Visit
Admission: RE | Admit: 2020-04-22 | Discharge: 2020-04-22 | Disposition: A | Discharge status: Home / Self Care | Payer: Medicare PPO | Attending: Family Medicine | Admitting: Family Medicine

## 2020-04-22 ENCOUNTER — Ambulatory Visit: Payer: Medicare PPO | Admitting: Family Medicine

## 2020-04-22 ENCOUNTER — Ambulatory Visit
Admission: RE | Admit: 2020-04-22 | Discharge: 2020-04-22 | Disposition: A | Discharge status: Home / Self Care | Payer: Medicare PPO | Source: Ambulatory Visit | Attending: Family Medicine | Admitting: Family Medicine

## 2020-04-22 ENCOUNTER — Telehealth: Payer: Self-pay | Admitting: Family Medicine

## 2020-04-22 VITALS — BP 120/70 | HR 80 | Ht 63.0 in | Wt 182.0 lb

## 2020-04-22 DIAGNOSIS — M79604 Pain in right leg: Secondary | ICD-10-CM

## 2020-04-22 DIAGNOSIS — M545 Low back pain: Secondary | ICD-10-CM | POA: Insufficient documentation

## 2020-04-22 MED ORDER — CYCLOBENZAPRINE HCL 5 MG PO TABS: 5.0000 mg | ORAL_TABLET | Freq: Every day | ORAL | 1 refills | Status: DC

## 2020-04-22 NOTE — Telephone Encounter (Signed)
Patient is calling because she had previous discussed nerve pain with Dr. Ronnald Ramp. Patient has her nerves in legs jumping. Patient this weekend felt pressure in her leg nerves. Patient is not taking any pain medication. She just has a sudden jump in nerves. Wanting to know does Dr. Ronnald Ramp want to evaluate her again? Or should be be referred for evaluation some where else? Please advise Cb- [phone]

## 2020-04-22 NOTE — Telephone Encounter (Signed)
Her discussion was about her nerves in the arm. So if it is her legs now- needs to be seen. Please call and see if she can come today. Tell her we can see her today

## 2020-04-22 NOTE — Progress Notes (Signed)
Date:  04/22/2020   Name:  Lindsey George   DOB:  1951-11-10   MRN:  [id number]   Chief Complaint: Back Pain (lower back pain radiating down both legs, but mostly the left leg. "feeling a pressure")  Back Pain This is a recurrent problem. The current episode started in the past 7 days. The problem occurs daily. The problem has been waxing and waning since onset. The pain is present in the lumbar spine. The quality of the pain is described as aching. The pain is at a severity of 6/10. The pain is moderate. Pertinent negatives include no abdominal pain, bladder incontinence, bowel incontinence, chest pain, dysuria, fever, headaches, leg pain, numbness, paresis, paresthesias, pelvic pain, perianal numbness, tingling, weakness or weight loss.    Lab Results  Component Value Date   CREATININE 0.53 (L) 03/06/2020   BUN 16 03/06/2020   NA 138 03/06/2020   K 4.3 03/06/2020   CL 99 03/06/2020   CO2 24 03/06/2020   Lab Results  Component Value Date   CHOL 128 03/06/2020   HDL 48 03/06/2020   LDLCALC 58 03/06/2020   TRIG 127 03/06/2020   CHOLHDL 2.9 09/19/2018   No results found for: TSH Lab Results  Component Value Date   HGBA1C 6.4 (H) 03/06/2020   Lab Results  Component Value Date   WBC 12.1 (H) 08/11/2018   HGB 12.9 08/11/2018   HCT 40.8 08/11/2018   MCV 80.5 08/11/2018   PLT 394 08/11/2018   Lab Results  Component Value Date   ALT 14 03/06/2020   AST 17 03/06/2020   ALKPHOS 50 03/06/2020   BILITOT 0.5 03/06/2020     Review of Systems  Constitutional: Negative for fever and weight loss.  Cardiovascular: Negative for chest pain.  Gastrointestinal: Negative for abdominal pain and bowel incontinence.  Genitourinary: Negative for bladder incontinence, dysuria and pelvic pain.  Musculoskeletal: Positive for back pain.  Neurological: Negative for tingling, weakness, numbness, headaches and paresthesias.    Patient Active Problem List   Diagnosis Date Noted  .  Essential hypertension 03/06/2020  . Cervical radiculopathy 03/06/2020  . Asymptomatic varicose veins of both lower extremities 03/06/2020  . Morbid obesity ([place]) 03/29/2019  . Allergic rhinitis 01/19/2018  . Hx of adenomatous colonic polyps 01/14/2018  . Reactive airway disease without complication [id number]  . Back ache 05/22/2015  . Alimentary obesity 05/22/2015  . Type 2 diabetes mellitus without complication, without long-term current use of insulin ([place]) 04/12/2014  . Hypercholesterolemia 04/12/2014  . OP (osteoporosis) 04/12/2014    Allergies  Allergen Reactions  . Codeine Other (See Comments)    Past Surgical History:  Procedure Laterality Date  . ABDOMINAL HYSTERECTOMY    . BREAST BIOPSY Right 06/22/11   bx/clip-neg  . BREAST CYST ASPIRATION Left    neg  . COLONOSCOPY  2014   cleared for 5 yrs- [place] doc  . COLONOSCOPY WITH PROPOFOL N/A 03/25/2018   Procedure: COLONOSCOPY WITH PROPOFOL;  Surgeon: Manya Silvas, MD;  Location: [facility] ENDOSCOPY;  Service: Endoscopy;  Laterality: N/A;  . PARTIAL HYSTERECTOMY    . SPLENECTOMY      Social History   Tobacco Use  . Smoking status: Never Smoker  . Smokeless tobacco: Never Used  . Tobacco comment: smoking cessation materials not required  Substance Use Topics  . Alcohol use: No    Alcohol/week: 0.0 standard drinks  . Drug use: No     Medication list has been reviewed and updated.  Current Meds  Medication Sig  . albuterol (PROVENTIL HFA;VENTOLIN HFA) 108 (90 Base) MCG/ACT inhaler Inhale 2 puffs into the lungs every 6 (six) hours as needed for wheezing or shortness of breath.  Marland Kitchen albuterol (PROVENTIL) (2.5 MG/3ML) 0.083% nebulizer solution Take 3 mLs (2.5 mg total) by nebulization every 6 (six) hours as needed for wheezing or shortness of breath.  Marland Kitchen alendronate (FOSAMAX) 70 MG tablet TAKE 1 TABLET BY MOUTH ONCE A WEEK. TAKE WITH A FULL GLASS OF WATER ON AN EMPTY STOMACH.  Marland Kitchen aspirin 81 MG tablet Take 81 mg by mouth  daily.  . calcium-vitamin D (OSCAL WITH D) 500-200 MG-UNIT tablet Take 1 tablet by mouth.  . Cholecalciferol (VITAMIN D3) 10 MCG (400 UNIT) CAPS   . EPIPEN 2-PAK 0.3 MG/0.3ML SOAJ injection   . glipiZIDE (GLIPIZIDE XL) 5 MG 24 hr tablet Take 1 tablet (5 mg total) by mouth daily.  Marland Kitchen loratadine (CLARITIN) 10 MG tablet Take 10 mg by mouth daily. otc  . losartan (COZAAR) 25 MG tablet Take 1 tablet (25 mg total) by mouth daily.  . metFORMIN (GLUCOPHAGE) 500 MG tablet TAKE ONE TABLET BY MOUTH TWICE DAILY  . Multiple Vitamin (MULTIVITAMIN) capsule Take 1 capsule by mouth daily.  Glory Rosebush ULTRA test strip USE 1 STRIP TO CHECK GLUCOSE ONCE DAILY  . pioglitazone (ACTOS) 15 MG tablet Take 1 tablet (15 mg total) by mouth daily.  . simvastatin (ZOCOR) 40 MG tablet Take 1 tablet by mouth once daily   Current Facility-Administered Medications for the 04/22/20 encounter (Office Visit) with Juline Patch, MD  Medication  . albuterol (PROVENTIL) (2.5 MG/3ML) 0.083% nebulizer solution 2.5 mg  . ipratropium-albuterol (DUONEB) 0.5-2.5 (3) MG/3ML nebulizer solution 3 mL    PHQ 2/9 Scores 04/22/2020 03/06/2020 01/10/2020 10/31/2019  PHQ - 2 Score 0 0 0 0  PHQ- 9 Score 0 0 0 0    BP Readings from Last 3 Encounters:  04/22/20 120/70  03/06/20 118/80  02/06/20 (!) 144/76    Physical Exam  Wt Readings from Last 3 Encounters:  04/22/20 182 lb (82.6 kg)  03/06/20 180 lb (81.6 kg)  02/06/20 183 lb (83 kg)    BP 120/70   Pulse 80   Ht 5\' 3"  (1.6 m)   Wt 182 lb (82.6 kg)   BMI 32.24 kg/m   Assessment and Plan: 1. Lumbar pain with radiation down both legs Chronic. Recurrent. Last episode noted was in 2019. At which time it was noted she had severe degenerative disc disease at L5-S1. Patient over the past week is noted lower back pain with pain radiating down into both legs to the feet. There is no paresthesias and no muscular weakness but does feel as if it may be going to "give out ". Also noted that  there was 25% compression fractures noted and there may be a progression of this. Patient has been given some cyclobenzaprine 5 mg to take at night for some relaxation and sedation to help sleep and we will obtain a lower back film to gauge current status of her compression fracture and note if there is any other degenerative changes other than L5-S1. - DG Lumbar Spine Complete; Future

## 2020-04-26 ENCOUNTER — Telehealth: Payer: Self-pay | Admitting: Family Medicine

## 2020-04-26 NOTE — Telephone Encounter (Signed)
Copied from [place] [id number]. Topic: Quick Communication - Office Called Patient (Clinic Use ONLY) >> Apr 26, 2020  8:15 AM Lennox Solders wrote: Reason for CRM:pt is returning tara call concerning xray result

## 2020-04-26 NOTE — Telephone Encounter (Signed)
Spoke to patient concerning xrays

## 2020-04-30 ENCOUNTER — Telehealth: Payer: Self-pay | Admitting: Family Medicine

## 2020-04-30 NOTE — Telephone Encounter (Signed)
Copied from [place] [id number]. Topic: General - Other >> Apr 30, 2020 12:48 PM Hinda Lenis D wrote: Reason for CRM: Pt wants a call back from [facility] to talk about new medication  [phone]

## 2020-04-30 NOTE — Telephone Encounter (Signed)
Pt wanted to know if she needed/ could do a "deep cleansing process to get all the toxins out." I informed pt to get some Miralax otc to have a good BM. We don't want her to get dehydrated from other means of cleansing.

## 2020-05-18 ENCOUNTER — Other Ambulatory Visit: Payer: Self-pay | Admitting: Family Medicine

## 2020-05-18 DIAGNOSIS — M810 Age-related osteoporosis without current pathological fracture: Secondary | ICD-10-CM

## 2020-05-21 ENCOUNTER — Other Ambulatory Visit: Payer: Self-pay | Admitting: Family Medicine

## 2020-05-21 DIAGNOSIS — Z1231 Encounter for screening mammogram for malignant neoplasm of breast: Secondary | ICD-10-CM

## 2020-06-10 ENCOUNTER — Ambulatory Visit (INDEPENDENT_AMBULATORY_CARE_PROVIDER_SITE_OTHER): Payer: Medicare PPO

## 2020-06-10 ENCOUNTER — Other Ambulatory Visit: Payer: Self-pay

## 2020-06-10 ENCOUNTER — Other Ambulatory Visit: Payer: Self-pay | Admitting: Family Medicine

## 2020-06-10 VITALS — BP 128/74 | HR 81 | Resp 16 | Ht 63.0 in | Wt 184.8 lb

## 2020-06-10 DIAGNOSIS — Z0000 Encounter for general adult medical examination without abnormal findings: Secondary | ICD-10-CM

## 2020-06-10 NOTE — Progress Notes (Addendum)
Subjective:   Lindsey George is a 69 y.o. female who presents for Medicare Annual (Subsequent) preventive examination.  Review of Systems     Cardiac Risk Factors include: advanced age (>63men, >11 women);diabetes mellitus;dyslipidemia;hypertension;obesity (BMI >30kg/m2)     Objective:    Today's Vitals   06/10/20 0833  BP: 128/74  Pulse: 81  Resp: 16  SpO2: 96%  Weight: 184 lb 12.8 oz (83.8 kg)  Height: 5\' 3"  (1.6 m)   Body mass index is 32.74 kg/m.  Advanced Directives 06/10/2020 06/05/2019 06/01/2018 03/25/2018 08/30/2015  Does Patient Have a Medical Advance Directive? No No No No No  Would patient like information on creating a medical advance directive? No - Patient declined Yes (MAU/Ambulatory/Procedural Areas - Information given) Yes (MAU/Ambulatory/Procedural Areas - Information given) - No - patient declined information    Current Medications (verified) Outpatient Encounter Medications as of 06/10/2020  Medication Sig   albuterol (PROVENTIL HFA;VENTOLIN HFA) 108 (90 Base) MCG/ACT inhaler Inhale 2 puffs into the lungs every 6 (six) hours as needed for wheezing or shortness of breath.   albuterol (PROVENTIL) (2.5 MG/3ML) 0.083% nebulizer solution Take 3 mLs (2.5 mg total) by nebulization every 6 (six) hours as needed for wheezing or shortness of breath.   alendronate (FOSAMAX) 70 MG tablet TAKE 1 TABLET BY MOUTH ONCE A WEEK. TAKE WITH A FULL GLASS OF WATER ON AN EMPTY STOMACH.   aspirin 81 MG tablet Take 81 mg by mouth daily.   calcium-vitamin D (OSCAL WITH D) 500-200 MG-UNIT tablet Take 1 tablet by mouth.   Cholecalciferol (VITAMIN D3) 10 MCG (400 UNIT) CAPS    glipiZIDE (GLIPIZIDE XL) 5 MG 24 hr tablet Take 1 tablet (5 mg total) by mouth daily.   loratadine (CLARITIN) 10 MG tablet Take 10 mg by mouth daily. otc   losartan (COZAAR) 25 MG tablet Take 1 tablet (25 mg total) by mouth daily.   metFORMIN (GLUCOPHAGE) 500 MG tablet TAKE ONE TABLET BY MOUTH TWICE DAILY    Multiple Vitamin (MULTIVITAMIN) capsule Take 1 capsule by mouth daily.   ONETOUCH ULTRA test strip USE 1 STRIP TO CHECK GLUCOSE ONCE DAILY   pioglitazone (ACTOS) 15 MG tablet Take 1 tablet (15 mg total) by mouth daily.   simvastatin (ZOCOR) 40 MG tablet Take 1 tablet by mouth once daily   cyclobenzaprine (FLEXERIL) 5 MG tablet Take 1 tablet (5 mg total) by mouth at bedtime. (Patient not taking: Reported on 06/10/2020)   EPIPEN 2-PAK 0.3 MG/0.3ML SOAJ injection    Facility-Administered Encounter Medications as of 06/10/2020  Medication   albuterol (PROVENTIL) (2.5 MG/3ML) 0.083% nebulizer solution 2.5 mg   ipratropium-albuterol (DUONEB) 0.5-2.5 (3) MG/3ML nebulizer solution 3 mL    Allergies (verified) Codeine   History: Past Medical History:  Diagnosis Date   Asthma    Cancer ([place])    skin ca   Chronic back pain    Diabetes mellitus without complication ([place])    Hyperlipidemia    Hypertension    Obesity    Osteoporosis    Past Surgical History:  Procedure Laterality Date   ABDOMINAL HYSTERECTOMY     BREAST BIOPSY Right 06/22/11   bx/clip-neg   BREAST CYST ASPIRATION Left    neg   COLONOSCOPY  2014   cleared for 5 yrs- [place] doc   COLONOSCOPY WITH PROPOFOL N/A 03/25/2018   Procedure: COLONOSCOPY WITH PROPOFOL;  Surgeon: Manya Silvas, MD;  Location: [facility] ENDOSCOPY;  Service: Endoscopy;  Laterality: N/A;   PARTIAL  HYSTERECTOMY     SPLENECTOMY     Family History  Problem Relation Age of Onset   Breast cancer Sister 58   Cancer Sister    Heart disease Sister    Cancer Father    Stroke Father    Heart disease Sister    Social History   Socioeconomic History   Marital status: Married    Spouse name: Not on file   Number of children: 2   Years of education: Not on file   Highest education level: Associate degree: academic program  Occupational History   Occupation: Retired  Tobacco Use   Smoking status: Never Smoker   Smokeless tobacco: Never Used   Tobacco  comment: smoking cessation materials not required  Scientific laboratory technician Use: Never used  Substance and Sexual Activity   Alcohol use: No    Alcohol/week: 0.0 standard drinks   Drug use: No   Sexual activity: Yes  Other Topics Concern   Not on file  Social History Narrative   Not on file   Social Determinants of Health   Financial Resource Strain: Low Risk    Difficulty of Paying Living Expenses: Not hard at all  Food Insecurity: No Food Insecurity   Worried About Charity fundraiser in the Last Year: Never true   [place] in the Last Year: Never true  Transportation Needs: No Transportation Needs   Lack of Transportation (Medical): No   Lack of Transportation (Non-Medical): No  Physical Activity: Inactive   Days of Exercise per Week: 0 days   Minutes of Exercise per Session: 0 min  Stress: No Stress Concern Present   Feeling of Stress : Not at all  Social Connections: Moderately Integrated   Frequency of Communication with Friends and Family: More than three times a week   Frequency of Social Gatherings with Friends and Family: Three times a week   Attends Religious Services: More than 4 times per year   Active Member of Clubs or Organizations: No   Attends Archivist Meetings: Never   Marital Status: Married    Tobacco Counseling Counseling given: Not Answered Comment: smoking cessation materials not required   Clinical Intake:  Pre-visit preparation completed: Yes  Pain : No/denies pain     BMI - recorded: 32.74 Nutritional Status: BMI > 30  Obese Nutritional Risks: None Diabetes: Yes CBG done?: No Did pt. bring in CBG monitor from home?: No  How often do you need to have someone help you when you read instructions, pamphlets, or other written materials from your doctor or pharmacy?: 1 - Never  Nutrition Risk Assessment:  Has the patient had any N/V/D within the last 2 months?  No  Does the patient have any non-healing wounds?  No  Has  the patient had any unintentional weight loss or weight gain?  No   Diabetes:  Is the patient diabetic?  Yes  If diabetic, was a CBG obtained today?  No  Did the patient bring in their glucometer from home?  No  How often do you monitor your CBG's? 1-2 times per week. 154 today fasting at home. .   Financial Strains and Diabetes Management:  Are you having any financial strains with the device, your supplies or your medication? No .  Does the patient want to be seen by Chronic Care Management for management of their diabetes?  No  Would the patient like to be referred to a Nutritionist or for  Diabetic Management?  No   Diabetic Exams:  Diabetic Eye Exam: Completed 11/14/19 negative retinopathy.   Diabetic Foot Exam: Completed 04/05/20.   Interpreter Needed?: No  Information entered by :: Clemetine Marker LPN   Activities of Daily Living In your present state of health, do you have any difficulty performing the following activities: 06/10/2020  Hearing? N  Comment declines hearing aids  Vision? N  Difficulty concentrating or making decisions? N  Walking or climbing stairs? N  Dressing or bathing? N  Doing errands, shopping? N  Preparing Food and eating ? N  Using the Toilet? N  In the past six months, have you accidently leaked urine? N  Do you have problems with loss of bowel control? N  Managing your Medications? N  Managing your Finances? N  Housekeeping or managing your Housekeeping? N  Some recent data might be hidden    Patient Care Team: Juline Patch, MD as PCP - General (Family Medicine) Catheryn Bacon, CNM (Inactive) as Midwife (Obstetrics and Gynecology) Allyne Gee, MD as Consulting Physician (Internal Medicine)  Indicate any recent Medical Services you may have received from other than [facility] providers in the past year (date may be approximate).     Assessment:   This is a routine wellness examination for [place].  Hearing/Vision screen  Hearing  Screening   125Hz  250Hz  500Hz  1000Hz  2000Hz  3000Hz  4000Hz  6000Hz  8000Hz   Right ear:           Left ear:           Comments: Pt denies hearing difficulty  Vision Screening Comments: [facility] screen[facility]  Dietary issues and exercise activities discussed: Current Exercise Habits: The patient does not participate in regular exercise at present, Exercise limited by: orthopedic condition(s)  Goals      DIET - INCREASE WATER INTAKE     Recommend to drink at least 6-8 8oz glasses of water per day.     Increase physical activity     Pt would like to get back to walking more and exercising overall, unable to go to the gym right now due to covid-19       Depression Screen PHQ 2/9 Scores 06/10/2020 04/22/2020 03/06/2020 01/10/2020 10/31/2019 10/02/2019 09/07/2019  PHQ - 2 Score 0 0 0 0 0 0 0  PHQ- 9 Score - 0 0 0 0 0 0    Fall Risk Fall Risk  06/10/2020 03/06/2020 09/07/2019 06/05/2019 11/16/2018  Falls in the past year? 0 0 0 1 0  Comment - - - fell out of bed -  Number falls in past yr: 0 - 0 1 -  Injury with Fall? 0 - 0 1 -  Risk for fall due to : No Fall Risks - - History of fall(s) -  Risk for fall due to: Comment - - - - -  Follow up Falls prevention discussed Falls evaluation completed - Falls prevention discussed Falls evaluation completed    Any stairs in or around the home? Yes  If so, are there any without handrails? No  Home free of loose throw rugs in walkways, pet beds, electrical cords, etc? Yes  Adequate lighting in your home to reduce risk of falls? Yes   ASSISTIVE DEVICES UTILIZED TO PREVENT FALLS:  Life alert? No  Use of a cane, walker or w/c? No  Grab bars in the bathroom? No  Shower chair or bench in shower? No  Elevated toilet seat or a handicapped  toilet? No   TIMED UP AND GO:  Was the test performed? Yes .  Length of time to ambulate 10 feet: 5 sec.   Gait steady and fast without use of assistive device  Cognitive Function:       6CIT Screen 06/10/2020 06/05/2019 06/01/2018  What Year? 0 points 0 points 0 points  What month? 0 points 0 points 0 points  What time? 0 points 0 points 0 points  Count back from 20 0 points 0 points 0 points  Months in reverse 0 points 0 points 0 points  Repeat phrase 0 points 0 points 2 points  Total Score 0 0 2    Immunizations Immunization History  Administered Date(s) Administered   Fluad Quad(high Dose 65+) 09/01/2019   Influenza, High Dose Seasonal PF 08/05/2017, 09/19/2018   Influenza,inj,Quad PF,6+ Mos 08/19/2016   PFIZER SARS-COV-2 Vaccination 01/05/2020, 01/26/2020   Pneumococcal Conjugate-13 09/03/2015   Pneumococcal Polysaccharide-23 09/30/2009, 08/05/2017   Td 07/31/2004   Tdap 08/19/2016   Zoster 07/20/2016    TDAP status: Up to date   Flu Vaccine status: Up to date   Pneumococcal vaccine status: Up to date   Covid-19 vaccine status: Completed vaccines  Qualifies for Shingles Vaccine? Yes   Zostavax completed Yes   Shingrix Completed?: No.    Education has been provided regarding the importance of this vaccine. Patient has been advised to call insurance company to determine out of pocket expense if they have not yet received this vaccine. Advised may also receive vaccine at local pharmacy or Health Dept. Verbalized acceptance and understanding.  Screening Tests Health Maintenance  Topic Date Due   MAMMOGRAM  06/18/2020   INFLUENZA VACCINE  06/30/2020   HEMOGLOBIN A1C  09/05/2020   OPHTHALMOLOGY EXAM  10/30/2020   FOOT EXAM  04/05/2021   COLONOSCOPY  03/26/2023   TETANUS/TDAP  08/19/2026   DEXA SCAN  Completed   COVID-19 Vaccine  Completed   Hepatitis C Screening  Completed   PNA vac Low Risk Adult  Completed    Health Maintenance  There are no preventive care reminders to display for this patient.  Colorectal cancer screening: Completed 03/25/18. Repeat every 5 years   Mammogram status: Completed 06/19/19. Repeat every year. Scheduled for  06/19/20  Bone Density status: Completed 06/05/16. Results reflect: Bone density results: OSTEOPENIA. Repeat every 3-5 years. Pt to discuss at next visit for repeat screening recommendations.   Lung Cancer Screening: (Low Dose CT Chest recommended if Age 68-80 years, 30 pack-year currently smoking OR have quit w/in 15years.) does not qualify.   Additional Screening:  Hepatitis C Screening: does qualify; Completed 08/05/17  Vision Screening: Recommended annual ophthalmology exams for early detection of glaucoma and other disorders of the eye. Is the patient up to date with their annual eye exam?  Yes  Who is the provider or what is the name of the office in which the patient attends annual eye exams? [place] Screening: Recommended annual dental exams for proper oral hygiene  Community Resource Referral / Chronic Care Management: CRR required this visit?  No   CCM required this visit?  No      Plan:     I have personally reviewed and noted the following in the patient's chart:   Medical and social history Use of alcohol, tobacco or illicit drugs  Current medications and supplements Functional ability and status Nutritional status Physical activity Advanced directives List of other physicians Hospitalizations, surgeries, and ER visits in  previous 12 months Vitals Screenings to include cognitive, depression, and falls Referrals and appointments  In addition, I have reviewed and discussed with patient certain preventive protocols, quality metrics, and best practice recommendations. A written personalized care plan for preventive services as well as general preventive health recommendations were provided to patient.     Clemetine Marker, LPN   [id number]   Nurse Notes: none

## 2020-06-10 NOTE — Patient Instructions (Signed)
Lindsey George , Thank you for taking time to come for your Medicare Wellness Visit. I appreciate your ongoing commitment to your health goals. Please review the following plan we discussed and let me know if I can assist you in the future.   Screening recommendations/referrals: Colonoscopy: done 03/25/18. Repeat in 2024.  Mammogram: done 06/19/19. Scheduled for 06/19/20.   Bone Density: done 06/05/16. Please discuss at your next appt.  Recommended yearly ophthalmology/optometry visit for glaucoma screening and checkup Recommended yearly dental visit for hygiene and checkup  Vaccinations: Influenza vaccine: done 09/01/19 Pneumococcal vaccine: done 08/05/17 Tdap vaccine: done 08/19/16 Shingles vaccine: Shingrix discussed. Please contact your pharmacy for coverage information.  Covid-19: done 01/05/20 7 01/26/20  Advanced directives: Advance directive discussed with you today. Even though you declined this today please call our office should you change your mind and we can give you the proper paperwork for you to fill out.  Conditions/risks identified: Recommend increasing physical activity as tolerated.   Next appointment: Follow up in one year for your annual wellness visit    Preventive Care 65 Years and Older, Female Preventive care refers to lifestyle choices and visits with your health care provider that can promote health and wellness. What does preventive care include?  A yearly physical exam. This is also called an annual well check.  Dental exams once or twice a year.  Routine eye exams. Ask your health care provider how often you should have your eyes checked.  Personal lifestyle choices, including:  Daily care of your teeth and gums.  Regular physical activity.  Eating a healthy diet.  Avoiding tobacco and drug use.  Limiting alcohol use.  Practicing safe sex.  Taking low-dose aspirin every day.  Taking vitamin and mineral supplements as recommended by your health care  provider. What happens during an annual well check? The services and screenings done by your health care provider during your annual well check will depend on your age, overall health, lifestyle risk factors, and family history of disease. Counseling  Your health care provider may ask you questions about your:  Alcohol use.  Tobacco use.  Drug use.  Emotional well-being.  Home and relationship well-being.  Sexual activity.  Eating habits.  History of falls.  Memory and ability to understand (cognition).  Work and work Statistician.  Reproductive health. Screening  You may have the following tests or measurements:  Height, weight, and BMI.  Blood pressure.  Lipid and cholesterol levels. These may be checked every 5 years, or more frequently if you are over 53 years old.  Skin check.  Lung cancer screening. You may have this screening every year starting at age 49 if you have a 30-pack-year history of smoking and currently smoke or have quit within the past 15 years.  Fecal occult blood test (FOBT) of the stool. You may have this test every year starting at age 19.  Flexible sigmoidoscopy or colonoscopy. You may have a sigmoidoscopy every 5 years or a colonoscopy every 10 years starting at age 73.  Hepatitis C blood test.  Hepatitis B blood test.  Sexually transmitted disease (STD) testing.  Diabetes screening. This is done by checking your blood sugar (glucose) after you have not eaten for a while (fasting). You may have this done every 1-3 years.  Bone density scan. This is done to screen for osteoporosis. You may have this done starting at age 14.  Mammogram. This may be done every 1-2 years. Talk to your health care provider  about how often you should have regular mammograms. Talk with your health care provider about your test results, treatment options, and if necessary, the need for more tests. Vaccines  Your health care provider may recommend certain  vaccines, such as:  Influenza vaccine. This is recommended every year.  Tetanus, diphtheria, and acellular pertussis (Tdap, Td) vaccine. You may need a Td booster every 10 years.  Zoster vaccine. You may need this after age 46.  Pneumococcal 13-valent conjugate (PCV13) vaccine. One dose is recommended after age 37.  Pneumococcal polysaccharide (PPSV23) vaccine. One dose is recommended after age 69. Talk to your health care provider about which screenings and vaccines you need and how often you need them. This information is not intended to replace advice given to you by your health care provider. Make sure you discuss any questions you have with your health care provider. Document Released: 12/13/2015 Document Revised: 08/05/2016 Document Reviewed: 09/17/2015 Elsevier Interactive Patient Education  2017 [employer] Prevention in the Home Falls can cause injuries. They can happen to people of all ages. There are many things you can do to make your home safe and to help prevent falls. What can I do on the outside of my home?  Regularly fix the edges of walkways and driveways and fix any cracks.  Remove anything that might make you trip as you walk through a door, such as a raised step or threshold.  Trim any bushes or trees on the path to your home.  Use bright outdoor lighting.  Clear any walking paths of anything that might make someone trip, such as rocks or tools.  Regularly check to see if handrails are loose or broken. Make sure that both sides of any steps have handrails.  Any raised decks and porches should have guardrails on the edges.  Have any leaves, snow, or ice cleared regularly.  Use sand or salt on walking paths during winter.  Clean up any spills in your garage right away. This includes oil or grease spills. What can I do in the bathroom?  Use night lights.  Install grab bars by the toilet and in the tub and shower. Do not use towel bars as grab  bars.  Use non-skid mats or decals in the tub or shower.  If you need to sit down in the shower, use a plastic, non-slip stool.  Keep the floor dry. Clean up any water that spills on the floor as soon as it happens.  Remove soap buildup in the tub or shower regularly.  Attach bath mats securely with double-sided non-slip rug tape.  Do not have throw rugs and other things on the floor that can make you trip. What can I do in the bedroom?  Use night lights.  Make sure that you have a light by your bed that is easy to reach.  Do not use any sheets or blankets that are too big for your bed. They should not hang down onto the floor.  Have a firm chair that has side arms. You can use this for support while you get dressed.  Do not have throw rugs and other things on the floor that can make you trip. What can I do in the kitchen?  Clean up any spills right away.  Avoid walking on wet floors.  Keep items that you use a lot in easy-to-reach places.  If you need to reach something above you, use a strong step stool that has a grab bar.  Keep electrical cords out of the way.  Do not use floor polish or wax that makes floors slippery. If you must use wax, use non-skid floor wax.  Do not have throw rugs and other things on the floor that can make you trip. What can I do with my stairs?  Do not leave any items on the stairs.  Make sure that there are handrails on both sides of the stairs and use them. Fix handrails that are broken or loose. Make sure that handrails are as long as the stairways.  Check any carpeting to make sure that it is firmly attached to the stairs. Fix any carpet that is loose or worn.  Avoid having throw rugs at the top or bottom of the stairs. If you do have throw rugs, attach them to the floor with carpet tape.  Make sure that you have a light switch at the top of the stairs and the bottom of the stairs. If you do not have them, ask someone to add them for  you. What else can I do to help prevent falls?  Wear shoes that:  Do not have high heels.  Have rubber bottoms.  Are comfortable and fit you well.  Are closed at the toe. Do not wear sandals.  If you use a stepladder:  Make sure that it is fully opened. Do not climb a closed stepladder.  Make sure that both sides of the stepladder are locked into place.  Ask someone to hold it for you, if possible.  Clearly mark and make sure that you can see:  Any grab bars or handrails.  First and last steps.  Where the edge of each step is.  Use tools that help you move around (mobility aids) if they are needed. These include:  Canes.  Walkers.  Scooters.  Crutches.  Turn on the lights when you go into a dark area. Replace any light bulbs as soon as they burn out.  Set up your furniture so you have a clear path. Avoid moving your furniture around.  If any of your floors are uneven, fix them.  If there are any pets around you, be aware of where they are.  Review your medicines with your doctor. Some medicines can make you feel dizzy. This can increase your chance of falling. Ask your doctor what other things that you can do to help prevent falls. This information is not intended to replace advice given to you by your health care provider. Make sure you discuss any questions you have with your health care provider. Document Released: 09/12/2009 Document Revised: 04/23/2016 Document Reviewed: 12/21/2014 Elsevier Interactive Patient Education  2017 [employer].

## 2020-06-13 ENCOUNTER — Telehealth: Payer: Self-pay | Admitting: Family Medicine

## 2020-06-13 NOTE — Telephone Encounter (Signed)
Answered qusetions on phone with [employer]- it will be 48 hr turn around before we know the answer to PA

## 2020-06-13 NOTE — Telephone Encounter (Signed)
Copied from [place] [id number]. Topic: General - Inquiry >> Jun 13, 2020  1:23 PM Mathis Bud wrote: Reason for CRM: mag from [facility] called requesting to finish PA on medication ONETOUCH ULTRA test strip.  Mag states she has 6 questions for nurse to answer. Call back  [phone]  Reference # [id number]

## 2020-06-18 ENCOUNTER — Other Ambulatory Visit: Payer: Self-pay | Admitting: Family Medicine

## 2020-06-18 NOTE — Telephone Encounter (Signed)
Test strips approved

## 2020-06-18 NOTE — Telephone Encounter (Signed)
Patient would like a call back in reference to getting her testing strips from [facility]. She says that she spoke to Dr. Ronnald Ramp nurse Baxter Flattery about this.

## 2020-06-19 ENCOUNTER — Ambulatory Visit
Admission: RE | Admit: 2020-06-19 | Discharge: 2020-06-19 | Disposition: A | Discharge status: Home / Self Care | Payer: Medicare PPO | Source: Ambulatory Visit | Attending: Family Medicine | Admitting: Family Medicine

## 2020-06-19 ENCOUNTER — Other Ambulatory Visit: Payer: Self-pay

## 2020-06-19 DIAGNOSIS — Z1231 Encounter for screening mammogram for malignant neoplasm of breast: Secondary | ICD-10-CM | POA: Diagnosis not present

## 2020-07-03 DIAGNOSIS — Z124 Encounter for screening for malignant neoplasm of cervix: Secondary | ICD-10-CM | POA: Diagnosis not present

## 2020-07-09 ENCOUNTER — Ambulatory Visit: Payer: Medicare PPO | Admitting: Family Medicine

## 2020-07-11 ENCOUNTER — Ambulatory Visit: Payer: Medicare PPO | Admitting: Family Medicine

## 2020-07-11 ENCOUNTER — Other Ambulatory Visit: Payer: Self-pay

## 2020-07-11 ENCOUNTER — Encounter: Payer: Self-pay | Admitting: Family Medicine

## 2020-07-11 VITALS — BP 120/68 | HR 80 | Ht 63.0 in | Wt 184.0 lb

## 2020-07-11 DIAGNOSIS — M5137 Other intervertebral disc degeneration, lumbosacral region: Secondary | ICD-10-CM

## 2020-07-11 DIAGNOSIS — E119 Type 2 diabetes mellitus without complications: Secondary | ICD-10-CM | POA: Diagnosis not present

## 2020-07-11 NOTE — Progress Notes (Signed)
Date:  07/11/2020   Name:  Lindsey George   DOB:  May 07, 1951   MRN:  [id number]   Chief Complaint: Diabetes and lumbar pain (radiates down both legs now)  Diabetes She presents for her follow-up diabetic visit. She has type 2 diabetes mellitus. Her disease course has been stable. There are no hypoglycemic associated symptoms. Pertinent negatives for hypoglycemia include no dizziness, headaches or nervousness/anxiousness. There are no diabetic associated symptoms. Pertinent negatives for diabetes include no fatigue, no polydipsia, no polyphagia, no polyuria and no weakness. There are no hypoglycemic complications. Symptoms are stable. There are no diabetic complications. Risk factors for coronary artery disease include diabetes mellitus, dyslipidemia and hypertension. Current diabetic treatment includes oral agent (triple therapy). She is compliant with treatment all of the time. Meal planning includes avoidance of concentrated sweets and carbohydrate counting. She participates in exercise daily. There is no change in her home blood glucose trend. Her breakfast blood glucose is taken between 8-9 am. Her breakfast blood glucose range is generally 130-140 mg/dl. An ACE inhibitor/angiotensin II receptor blocker is being taken. She does not see a podiatrist.Eye exam is current.  Back Pain This is a recurrent problem. The problem occurs intermittently. The problem has been waxing and waning since onset. The pain is present in the lumbar spine. The pain radiates to the left foot, left thigh, right thigh, right foot, right knee and left knee. The pain is moderate. The symptoms are aggravated by sitting and standing. Pertinent negatives include no abdominal pain, bladder incontinence, bowel incontinence, dysuria, fever, headaches, numbness, paresis, paresthesias, pelvic pain or weakness.    Lab Results  Component Value Date   CREATININE 0.53 (L) 03/06/2020   BUN 16 03/06/2020   NA 138 03/06/2020   K  4.3 03/06/2020   CL 99 03/06/2020   CO2 24 03/06/2020   Lab Results  Component Value Date   CHOL 128 03/06/2020   HDL 48 03/06/2020   LDLCALC 58 03/06/2020   TRIG 127 03/06/2020   CHOLHDL 2.9 09/19/2018   No results found for: TSH Lab Results  Component Value Date   HGBA1C 6.4 (H) 03/06/2020   Lab Results  Component Value Date   WBC 12.1 (H) 08/11/2018   HGB 12.9 08/11/2018   HCT 40.8 08/11/2018   MCV 80.5 08/11/2018   PLT 394 08/11/2018   Lab Results  Component Value Date   ALT 14 03/06/2020   AST 17 03/06/2020   ALKPHOS 50 03/06/2020   BILITOT 0.5 03/06/2020     Review of Systems  Constitutional: Negative.  Negative for chills, fatigue, fever and unexpected weight change.  HENT: Negative for congestion, ear discharge, ear pain, rhinorrhea, sinus pressure, sneezing and sore throat.   Eyes: Negative for photophobia, pain, discharge, redness and itching.  Respiratory: Negative for cough, shortness of breath, wheezing and stridor.   Gastrointestinal: Negative for abdominal pain, blood in stool, bowel incontinence, constipation, diarrhea, nausea and vomiting.  Endocrine: Negative for cold intolerance, heat intolerance, polydipsia, polyphagia and polyuria.  Genitourinary: Negative for bladder incontinence, dysuria, flank pain, frequency, hematuria, menstrual problem, pelvic pain, urgency, vaginal bleeding and vaginal discharge.  Musculoskeletal: Positive for back pain. Negative for arthralgias and myalgias.  Skin: Negative for rash.  Allergic/Immunologic: Negative for environmental allergies and food allergies.  Neurological: Negative for dizziness, weakness, light-headedness, numbness, headaches and paresthesias.  Hematological: Negative for adenopathy. Does not bruise/bleed easily.  Psychiatric/Behavioral: Negative for dysphoric mood. The patient is not nervous/anxious.     Patient  Active Problem List   Diagnosis Date Noted   Essential hypertension 03/06/2020    Cervical radiculopathy 03/06/2020   Asymptomatic varicose veins of both lower extremities 03/06/2020   Morbid obesity ([place]) 03/29/2019   Allergic rhinitis 01/19/2018   Hx of adenomatous colonic polyps 01/14/2018   Reactive airway disease without complication 40/98/1191   Back ache 05/22/2015   Alimentary obesity 05/22/2015   Type 2 diabetes mellitus without complication, without long-term current use of insulin ([place]) 04/12/2014   Hypercholesterolemia 04/12/2014   OP (osteoporosis) 04/12/2014    Allergies  Allergen Reactions   Codeine Other (See Comments)    Past Surgical History:  Procedure Laterality Date   ABDOMINAL HYSTERECTOMY     BREAST BIOPSY Right 06/22/11   bx/clip-neg   BREAST CYST ASPIRATION Left    neg   COLONOSCOPY  2014   cleared for 5 yrs- [facility]   COLONOSCOPY WITH PROPOFOL N/A 03/25/2018   Procedure: COLONOSCOPY WITH PROPOFOL;  Surgeon: Manya Silvas, MD;  Location: [facility] ENDOSCOPY;  Service: Endoscopy;  Laterality: N/A;   PARTIAL HYSTERECTOMY     SPLENECTOMY      Social History   Tobacco Use   Smoking status: Never Smoker   Smokeless tobacco: Never Used   Tobacco comment: smoking cessation materials not required  Vaping Use   Vaping Use: Never used  Substance Use Topics   Alcohol use: No    Alcohol/week: 0.0 standard drinks   Drug use: No     Medication list has been reviewed and updated.  Current Meds  Medication Sig   albuterol (PROVENTIL HFA;VENTOLIN HFA) 108 (90 Base) MCG/ACT inhaler Inhale 2 puffs into the lungs every 6 (six) hours as needed for wheezing or shortness of breath.   albuterol (PROVENTIL) (2.5 MG/3ML) 0.083% nebulizer solution Take 3 mLs (2.5 mg total) by nebulization every 6 (six) hours as needed for wheezing or shortness of breath.   alendronate (FOSAMAX) 70 MG tablet TAKE 1 TABLET BY MOUTH ONCE A WEEK. TAKE WITH A FULL GLASS OF WATER ON AN EMPTY STOMACH.   aspirin 81 MG tablet Take 81 mg by mouth  daily.   calcium-vitamin D (OSCAL WITH D) 500-200 MG-UNIT tablet Take 1 tablet by mouth.   Cholecalciferol (VITAMIN D3) 10 MCG (400 UNIT) CAPS    EPIPEN 2-PAK 0.3 MG/0.3ML SOAJ injection    glipiZIDE (GLIPIZIDE XL) 5 MG 24 hr tablet Take 1 tablet (5 mg total) by mouth daily.   loratadine (CLARITIN) 10 MG tablet Take 10 mg by mouth daily. otc   losartan (COZAAR) 25 MG tablet Take 1 tablet (25 mg total) by mouth daily.   metFORMIN (GLUCOPHAGE) 500 MG tablet TAKE ONE TABLET BY MOUTH TWICE DAILY   Multiple Vitamin (MULTIVITAMIN) capsule Take 1 capsule by mouth daily.   ONETOUCH ULTRA test strip USE 1 STRIP TO CHECK GLUCOSE ONCE DAILY   pioglitazone (ACTOS) 15 MG tablet Take 1 tablet (15 mg total) by mouth daily.   simvastatin (ZOCOR) 40 MG tablet Take 1 tablet by mouth once daily   Current Facility-Administered Medications for the 07/11/20 encounter (Office Visit) with Juline Patch, MD  Medication   albuterol (PROVENTIL) (2.5 MG/3ML) 0.083% nebulizer solution 2.5 mg   ipratropium-albuterol (DUONEB) 0.5-2.5 (3) MG/3ML nebulizer solution 3 mL    PHQ 2/9 Scores 07/11/2020 06/10/2020 04/22/2020 03/06/2020  PHQ - 2 Score 0 0 0 0  PHQ- 9 Score 1 - 0 0    GAD 7 : Generalized Anxiety Score 07/11/2020 04/22/2020 03/06/2020 01/10/2020  Nervous, Anxious, on Edge 0 0 0 0  Control/stop worrying 0 0 0 0  Worry too much - different things 0 0 0 0  Trouble relaxing 0 0 0 0  Restless 0 0 0 0  Easily annoyed or irritable 0 0 0 0  Afraid - awful might happen 0 0 0 0  Total GAD 7 Score 0 0 0 0    BP Readings from Last 3 Encounters:  07/11/20 120/68  06/10/20 128/74  04/22/20 120/70    Physical Exam Vitals and nursing note reviewed.  Constitutional:      Appearance: She is well-developed.  HENT:     Head: Normocephalic.     Right Ear: Tympanic membrane, ear canal and external ear normal.     Left Ear: Tympanic membrane, ear canal and external ear normal.     Nose: Nose normal.  Eyes:      General: Lids are everted, no foreign bodies appreciated. No scleral icterus.       Left eye: No foreign body or hordeolum.     Conjunctiva/sclera: Conjunctivae normal.     Right eye: Right conjunctiva is not injected.     Left eye: Left conjunctiva is not injected.     Pupils: Pupils are equal, round, and reactive to light.  Neck:     Thyroid: No thyromegaly.     Vascular: No JVD.     Trachea: No tracheal deviation.  Cardiovascular:     Rate and Rhythm: Normal rate and regular rhythm.     Heart sounds: Normal heart sounds. No murmur heard.  No friction rub. No gallop.   Pulmonary:     Effort: Pulmonary effort is normal. No respiratory distress.     Breath sounds: Normal breath sounds. No wheezing, rhonchi or rales.  Abdominal:     General: Bowel sounds are normal.     Palpations: Abdomen is soft. There is no mass.     Tenderness: There is no abdominal tenderness. There is no guarding or rebound.  Musculoskeletal:        General: No tenderness. Normal range of motion.     Cervical back: Normal range of motion and neck supple.  Lymphadenopathy:     Cervical: No cervical adenopathy.  Skin:    General: Skin is warm.     Findings: No rash.  Neurological:     Mental Status: She is alert and oriented to person, place, and time.     Cranial Nerves: No cranial nerve deficit.     Deep Tendon Reflexes: Reflexes normal.  Psychiatric:        Mood and Affect: Mood is not anxious or depressed.     Wt Readings from Last 3 Encounters:  07/11/20 184 lb (83.5 kg)  06/10/20 184 lb 12.8 oz (83.8 kg)  04/22/20 182 lb (82.6 kg)    BP 120/68    Pulse 80    Ht 5\' 3"  (1.6 m)    Wt 184 lb (83.5 kg)    BMI 32.59 kg/m   Assessment and Plan:  1. Type 2 diabetes mellitus without complication, without long-term current use of insulin (HCC) Chronic.  Stable.  Controlled.  We will check A1c for current level of control. - Hemoglobin A1c  2. Degeneration, intervertebral disc,  lumbosacral Relatively new onset.  Persistent.  Uncontrolled.  Patient has had issues with the neck in the past but patient now is having lower back pain which radiates to both legs to the feet.  We will  refer to orthopedics for evaluation and further treatment. - Ambulatory referral to Orthopedic Surgery

## 2020-07-12 LAB — HEMOGLOBIN A1C
Est. average glucose Bld gHb Est-mCnc: 148 mg/dL
Hgb A1c MFr Bld: 6.8 % — ABNORMAL HIGH (ref 4.8–5.6)

## 2020-07-25 DIAGNOSIS — Z6832 Body mass index (BMI) 32.0-32.9, adult: Secondary | ICD-10-CM | POA: Diagnosis not present

## 2020-07-25 DIAGNOSIS — G8929 Other chronic pain: Secondary | ICD-10-CM | POA: Diagnosis not present

## 2020-07-25 DIAGNOSIS — M4807 Spinal stenosis, lumbosacral region: Secondary | ICD-10-CM | POA: Diagnosis not present

## 2020-07-25 DIAGNOSIS — M5442 Lumbago with sciatica, left side: Secondary | ICD-10-CM | POA: Diagnosis not present

## 2020-07-25 DIAGNOSIS — E119 Type 2 diabetes mellitus without complications: Secondary | ICD-10-CM | POA: Diagnosis not present

## 2020-07-25 DIAGNOSIS — M5441 Lumbago with sciatica, right side: Secondary | ICD-10-CM | POA: Diagnosis not present

## 2020-07-25 DIAGNOSIS — M48062 Spinal stenosis, lumbar region with neurogenic claudication: Secondary | ICD-10-CM | POA: Diagnosis not present

## 2020-07-25 DIAGNOSIS — E6609 Other obesity due to excess calories: Secondary | ICD-10-CM | POA: Diagnosis not present

## 2020-07-25 DIAGNOSIS — M5136 Other intervertebral disc degeneration, lumbar region: Secondary | ICD-10-CM | POA: Diagnosis not present

## 2020-07-26 ENCOUNTER — Other Ambulatory Visit: Payer: Self-pay | Admitting: Family Medicine

## 2020-07-26 DIAGNOSIS — M810 Age-related osteoporosis without current pathological fracture: Secondary | ICD-10-CM

## 2020-07-29 ENCOUNTER — Other Ambulatory Visit: Payer: Self-pay | Admitting: Orthopedic Surgery

## 2020-07-29 DIAGNOSIS — D2262 Melanocytic nevi of left upper limb, including shoulder: Secondary | ICD-10-CM | POA: Diagnosis not present

## 2020-07-29 DIAGNOSIS — M5136 Other intervertebral disc degeneration, lumbar region: Secondary | ICD-10-CM

## 2020-07-29 DIAGNOSIS — D2271 Melanocytic nevi of right lower limb, including hip: Secondary | ICD-10-CM | POA: Diagnosis not present

## 2020-07-29 DIAGNOSIS — D225 Melanocytic nevi of trunk: Secondary | ICD-10-CM | POA: Diagnosis not present

## 2020-07-29 DIAGNOSIS — D2261 Melanocytic nevi of right upper limb, including shoulder: Secondary | ICD-10-CM | POA: Diagnosis not present

## 2020-07-29 DIAGNOSIS — D2272 Melanocytic nevi of left lower limb, including hip: Secondary | ICD-10-CM | POA: Diagnosis not present

## 2020-07-29 DIAGNOSIS — L821 Other seborrheic keratosis: Secondary | ICD-10-CM | POA: Diagnosis not present

## 2020-07-29 DIAGNOSIS — M5442 Lumbago with sciatica, left side: Secondary | ICD-10-CM

## 2020-07-29 DIAGNOSIS — M4807 Spinal stenosis, lumbosacral region: Secondary | ICD-10-CM

## 2020-07-29 DIAGNOSIS — M48062 Spinal stenosis, lumbar region with neurogenic claudication: Secondary | ICD-10-CM

## 2020-08-07 ENCOUNTER — Other Ambulatory Visit: Payer: Self-pay

## 2020-08-07 ENCOUNTER — Ambulatory Visit
Admission: RE | Admit: 2020-08-07 | Discharge: 2020-08-07 | Disposition: A | Discharge status: Home / Self Care | Payer: Medicare PPO | Source: Ambulatory Visit | Attending: Orthopedic Surgery | Admitting: Orthopedic Surgery

## 2020-08-07 DIAGNOSIS — M48062 Spinal stenosis, lumbar region with neurogenic claudication: Secondary | ICD-10-CM | POA: Diagnosis not present

## 2020-08-07 DIAGNOSIS — G8929 Other chronic pain: Secondary | ICD-10-CM | POA: Diagnosis not present

## 2020-08-07 DIAGNOSIS — M47816 Spondylosis without myelopathy or radiculopathy, lumbar region: Secondary | ICD-10-CM | POA: Diagnosis not present

## 2020-08-07 DIAGNOSIS — M5442 Lumbago with sciatica, left side: Secondary | ICD-10-CM | POA: Insufficient documentation

## 2020-08-07 DIAGNOSIS — M48061 Spinal stenosis, lumbar region without neurogenic claudication: Secondary | ICD-10-CM | POA: Diagnosis not present

## 2020-08-07 DIAGNOSIS — M51369 Other intervertebral disc degeneration, lumbar region without mention of lumbar back pain or lower extremity pain: Secondary | ICD-10-CM

## 2020-08-07 DIAGNOSIS — M4807 Spinal stenosis, lumbosacral region: Secondary | ICD-10-CM | POA: Diagnosis not present

## 2020-08-07 DIAGNOSIS — M5136 Other intervertebral disc degeneration, lumbar region: Secondary | ICD-10-CM | POA: Insufficient documentation

## 2020-08-07 DIAGNOSIS — M5126 Other intervertebral disc displacement, lumbar region: Secondary | ICD-10-CM | POA: Diagnosis not present

## 2020-08-16 ENCOUNTER — Other Ambulatory Visit: Payer: Medicare PPO

## 2020-08-27 DIAGNOSIS — M5442 Lumbago with sciatica, left side: Secondary | ICD-10-CM | POA: Diagnosis not present

## 2020-08-27 DIAGNOSIS — M48062 Spinal stenosis, lumbar region with neurogenic claudication: Secondary | ICD-10-CM | POA: Diagnosis not present

## 2020-08-27 DIAGNOSIS — G8929 Other chronic pain: Secondary | ICD-10-CM | POA: Insufficient documentation

## 2020-08-27 DIAGNOSIS — M5441 Lumbago with sciatica, right side: Secondary | ICD-10-CM | POA: Diagnosis not present

## 2020-08-30 ENCOUNTER — Other Ambulatory Visit: Payer: Self-pay | Admitting: Family Medicine

## 2020-08-30 DIAGNOSIS — M810 Age-related osteoporosis without current pathological fracture: Secondary | ICD-10-CM

## 2020-09-05 ENCOUNTER — Other Ambulatory Visit: Payer: Self-pay | Admitting: Family Medicine

## 2020-09-05 DIAGNOSIS — M810 Age-related osteoporosis without current pathological fracture: Secondary | ICD-10-CM

## 2020-09-06 ENCOUNTER — Other Ambulatory Visit: Payer: Self-pay | Admitting: Family Medicine

## 2020-09-06 DIAGNOSIS — I10 Essential (primary) hypertension: Secondary | ICD-10-CM

## 2020-10-08 ENCOUNTER — Encounter: Payer: Self-pay | Admitting: Family Medicine

## 2020-10-08 ENCOUNTER — Other Ambulatory Visit: Payer: Self-pay

## 2020-10-08 ENCOUNTER — Ambulatory Visit: Payer: Medicare PPO | Admitting: Family Medicine

## 2020-10-08 ENCOUNTER — Telehealth: Payer: Self-pay

## 2020-10-08 VITALS — BP 130/70 | HR 76 | Ht 63.0 in | Wt 182.0 lb

## 2020-10-08 DIAGNOSIS — Q383 Other congenital malformations of tongue: Secondary | ICD-10-CM | POA: Diagnosis not present

## 2020-10-08 DIAGNOSIS — M48062 Spinal stenosis, lumbar region with neurogenic claudication: Secondary | ICD-10-CM

## 2020-10-08 NOTE — Telephone Encounter (Signed)
Spoke to pt- I called [facility] and [facility]. None of which take her insurance. I also called Dr Alcario Drought office to see if they could advise on another oral surgeon- those are the ones that they send to as well. I advised pt to call her insurance and see who is in network. We will go from there after hearing from the pt

## 2020-10-08 NOTE — Progress Notes (Signed)
Date:  10/08/2020   Name:  Lindsey George   DOB:  03-28-1951   MRN:  [id number]   Chief Complaint: bit tongue (bit tongue a couple of months ago- the place is till there. It is sore)  Patient is a 69 year old female who presents for a oral  exam. The patient reports the following problems: oral lesion. Health maintenance has been reviewed up to date. Onset over 6 months.sensitive/painful. nonbleeding.   Lab Results  Component Value Date   CREATININE 0.53 (L) 03/06/2020   BUN 16 03/06/2020   NA 138 03/06/2020   K 4.3 03/06/2020   CL 99 03/06/2020   CO2 24 03/06/2020   Lab Results  Component Value Date   CHOL 128 03/06/2020   HDL 48 03/06/2020   LDLCALC 58 03/06/2020   TRIG 127 03/06/2020   CHOLHDL 2.9 09/19/2018   No results found for: TSH Lab Results  Component Value Date   HGBA1C 6.8 (H) 07/11/2020   Lab Results  Component Value Date   WBC 12.1 (H) 08/11/2018   HGB 12.9 08/11/2018   HCT 40.8 08/11/2018   MCV 80.5 08/11/2018   PLT 394 08/11/2018   Lab Results  Component Value Date   ALT 14 03/06/2020   AST 17 03/06/2020   ALKPHOS 50 03/06/2020   BILITOT 0.5 03/06/2020     Review of Systems  Constitutional: Negative.  Negative for chills, fatigue, fever and unexpected weight change.  HENT: Negative for congestion, ear discharge, ear pain, rhinorrhea, sinus pressure, sneezing and sore throat.   Eyes: Negative for photophobia, pain, discharge, redness and itching.  Respiratory: Negative for cough, shortness of breath, wheezing and stridor.   Gastrointestinal: Negative for abdominal pain, blood in stool, constipation, diarrhea, nausea and vomiting.  Endocrine: Negative for cold intolerance, heat intolerance, polydipsia, polyphagia and polyuria.  Genitourinary: Negative for dysuria, flank pain, frequency, hematuria, menstrual problem, pelvic pain, urgency, vaginal bleeding and vaginal discharge.  Musculoskeletal: Negative for arthralgias, back pain and  myalgias.  Skin: Negative for rash.  Allergic/Immunologic: Negative for environmental allergies and food allergies.  Neurological: Negative for dizziness, weakness, light-headedness, numbness and headaches.  Hematological: Negative for adenopathy. Does not bruise/bleed easily.  Psychiatric/Behavioral: Negative for dysphoric mood. The patient is not nervous/anxious.     Patient Active Problem List   Diagnosis Date Noted  . Essential hypertension 03/06/2020  . Cervical radiculopathy 03/06/2020  . Asymptomatic varicose veins of both lower extremities 03/06/2020  . Morbid obesity ([place]) 03/29/2019  . Allergic rhinitis 01/19/2018  . Hx of adenomatous colonic polyps 01/14/2018  . Reactive airway disease without complication 95/63/8756  . Back ache 05/22/2015  . Alimentary obesity 05/22/2015  . Type 2 diabetes mellitus without complication, without long-term current use of insulin ([place]) 04/12/2014  . Hypercholesterolemia 04/12/2014  . OP (osteoporosis) 04/12/2014    Allergies  Allergen Reactions  . Codeine Other (See Comments)    Past Surgical History:  Procedure Laterality Date  . ABDOMINAL HYSTERECTOMY    . BREAST BIOPSY Right 06/22/11   bx/clip-neg  . BREAST CYST ASPIRATION Left    neg  . COLONOSCOPY  2014   cleared for 5 yrs- [facility]  . COLONOSCOPY WITH PROPOFOL N/A 03/25/2018   Procedure: COLONOSCOPY WITH PROPOFOL;  Surgeon: Manya Silvas, MD;  Location: [facility] ENDOSCOPY;  Service: Endoscopy;  Laterality: N/A;  . PARTIAL HYSTERECTOMY    . SPLENECTOMY      Social History   Tobacco Use  . Smoking status: Never  Smoker  . Smokeless tobacco: Never Used  . Tobacco comment: smoking cessation materials not required  Vaping Use  . Vaping Use: Never used  Substance Use Topics  . Alcohol use: No    Alcohol/week: 0.0 standard drinks  . Drug use: No     Medication list has been reviewed and updated.  Current Meds  Medication Sig  . albuterol (PROVENTIL HFA;VENTOLIN HFA)  108 (90 Base) MCG/ACT inhaler Inhale 2 puffs into the lungs every 6 (six) hours as needed for wheezing or shortness of breath.  Marland Kitchen albuterol (PROVENTIL) (2.5 MG/3ML) 0.083% nebulizer solution Take 3 mLs (2.5 mg total) by nebulization every 6 (six) hours as needed for wheezing or shortness of breath.  Marland Kitchen alendronate (FOSAMAX) 70 MG tablet TAKE 1 TABLET BY MOUTH ONCE A WEEK. TAKE WITH A FULL GLASS OF WATER ON AN EMPTY STOMACH.  Marland Kitchen aspirin 81 MG tablet Take 81 mg by mouth daily.  . calcium-vitamin D (OSCAL WITH D) 500-200 MG-UNIT tablet Take 1 tablet by mouth.  . Cholecalciferol (VITAMIN D3) 10 MCG (400 UNIT) CAPS   . cyclobenzaprine (FLEXERIL) 10 MG tablet Take by mouth. mundy  . EPIPEN 2-PAK 0.3 MG/0.3ML SOAJ injection   . gabapentin (NEURONTIN) 100 MG capsule Take 200 mg by mouth daily. mundy  . glipiZIDE (GLIPIZIDE XL) 5 MG 24 hr tablet Take 1 tablet (5 mg total) by mouth daily.  Marland Kitchen loratadine (CLARITIN) 10 MG tablet Take 10 mg by mouth daily. otc  . losartan (COZAAR) 25 MG tablet Take 1 tablet by mouth once daily  . metFORMIN (GLUCOPHAGE) 500 MG tablet TAKE ONE TABLET BY MOUTH TWICE DAILY  . Multiple Vitamin (MULTIVITAMIN) capsule Take 1 capsule by mouth daily.  Glory Rosebush ULTRA test strip USE 1 STRIP TO CHECK GLUCOSE ONCE DAILY  . pioglitazone (ACTOS) 15 MG tablet Take 1 tablet (15 mg total) by mouth daily.  . simvastatin (ZOCOR) 40 MG tablet Take 1 tablet by mouth once daily   Current Facility-Administered Medications for the 10/08/20 encounter (Office Visit) with Juline Patch, MD  Medication  . albuterol (PROVENTIL) (2.5 MG/3ML) 0.083% nebulizer solution 2.5 mg  . ipratropium-albuterol (DUONEB) 0.5-2.5 (3) MG/3ML nebulizer solution 3 mL    PHQ 2/9 Scores 07/11/2020 06/10/2020 04/22/2020 03/06/2020  PHQ - 2 Score 0 0 0 0  PHQ- 9 Score 1 - 0 0    GAD 7 : Generalized Anxiety Score 07/11/2020 04/22/2020 03/06/2020 01/10/2020  Nervous, Anxious, on Edge 0 0 0 0  Control/stop worrying 0 0 0 0   Worry too much - different things 0 0 0 0  Trouble relaxing 0 0 0 0  Restless 0 0 0 0  Easily annoyed or irritable 0 0 0 0  Afraid - awful might happen 0 0 0 0  Total GAD 7 Score 0 0 0 0    BP Readings from Last 3 Encounters:  10/08/20 130/70  07/11/20 120/68  06/10/20 128/74    Physical Exam Vitals and nursing note reviewed.  Constitutional:      Appearance: She is well-developed.  HENT:     Head: Normocephalic.     Right Ear: Tympanic membrane, ear canal and external ear normal. There is no impacted cerumen.     Left Ear: Tympanic membrane, ear canal and external ear normal. There is no impacted cerumen.     Nose: Nose normal.     Mouth/Throat:     Lips: Pink.     Mouth: Mucous membranes are moist.  Dentition: Normal dentition. No dental tenderness.     Tongue: Lesions present. Tongue does not deviate from midline.     Palate: No mass and lesions.     Pharynx: Oropharynx is clear. Uvula midline. No pharyngeal swelling, oropharyngeal exudate, posterior oropharyngeal erythema or uvula swelling.   Eyes:     General: Lids are everted, no foreign bodies appreciated. No scleral icterus.       Left eye: No foreign body or hordeolum.     Conjunctiva/sclera: Conjunctivae normal.     Right eye: Right conjunctiva is not injected.     Left eye: Left conjunctiva is not injected.     Pupils: Pupils are equal, round, and reactive to light.  Neck:     Thyroid: No thyromegaly.     Vascular: No JVD.     Trachea: No tracheal deviation.  Cardiovascular:     Rate and Rhythm: Normal rate and regular rhythm.     Heart sounds: Normal heart sounds. No murmur heard.  No friction rub. No gallop.   Pulmonary:     Effort: Pulmonary effort is normal. No respiratory distress.     Breath sounds: Normal breath sounds. No wheezing or rales.  Abdominal:     General: Bowel sounds are normal.     Palpations: Abdomen is soft. There is no mass.     Tenderness: There is no abdominal tenderness.  There is no guarding or rebound.  Musculoskeletal:        General: No tenderness. Normal range of motion.     Cervical back: Normal range of motion and neck supple.  Lymphadenopathy:     Cervical: No cervical adenopathy.  Skin:    General: Skin is warm.     Findings: No rash.  Neurological:     Mental Status: She is alert and oriented to person, place, and time.     Cranial Nerves: No cranial nerve deficit.     Deep Tendon Reflexes: Reflexes normal.  Psychiatric:        Mood and Affect: Mood is not anxious or depressed.     Wt Readings from Last 3 Encounters:  10/08/20 182 lb (82.6 kg)  07/11/20 184 lb (83.5 kg)  06/10/20 184 lb 12.8 oz (83.8 kg)    BP 130/70   Pulse 76   Ht 5\' 3"  (1.6 m)   Wt 182 lb (82.6 kg)   BMI 32.24 kg/m   Assessment and Plan: 1. Tongue abnormality Patient has noticed the growth its on the edge of the left lateral tongue.  It has a wartlike appearance and is tender.  Patient would like to have it removed if possible and we will refer to oral surgery for evaluation and treatment. - Ambulatory referral to Oral Maxillofacial Surgery  2. Spinal stenosis of lumbar region with neurogenic claudication Chronic.  Uncontrolled.  Patient has been evaluated and was to see Dr. Alba Destine for likely injections.  We will refer to orthopedics for appointment for evaluation and treatment thereof. - Ambulatory referral to Orthopedic Surgery

## 2020-10-08 NOTE — Telephone Encounter (Signed)
Patient states [facility] phone # [phone] accepts [employer]. Patient would like a follow up call when referral has been placed.

## 2020-10-08 NOTE — Telephone Encounter (Signed)
Copied from [place] [id number]. Topic: General - Other >> Oct 08, 2020  2:23 PM Celene Kras wrote: Reason for CRM: Pt called stating that she checked with other oral surgeons offices. She states that they opted out of [employer]. Pt is requesting to know if she will be responsible for finding someone else or if pcp will be able to look for her. Please advise.

## 2020-10-09 NOTE — Telephone Encounter (Signed)
Noted and passed on to referral team. I also called pt and told her to call us by Friday if she hasn't heard about an appt

## 2020-10-10 DIAGNOSIS — E119 Type 2 diabetes mellitus without complications: Secondary | ICD-10-CM | POA: Diagnosis not present

## 2020-10-10 DIAGNOSIS — M48062 Spinal stenosis, lumbar region with neurogenic claudication: Secondary | ICD-10-CM | POA: Diagnosis not present

## 2020-10-10 DIAGNOSIS — M5441 Lumbago with sciatica, right side: Secondary | ICD-10-CM | POA: Diagnosis not present

## 2020-10-10 DIAGNOSIS — M5442 Lumbago with sciatica, left side: Secondary | ICD-10-CM | POA: Diagnosis not present

## 2020-10-11 ENCOUNTER — Telehealth: Payer: Self-pay

## 2020-10-11 NOTE — Telephone Encounter (Signed)
Copied from [place] [id number]. Topic: General - Other >> Oct 11, 2020 11:10 AM Rainey Pines A wrote: Patient hasn't heard back from private diagnostics clinic in [place] and would like a callback to speak with Baxter Flattery about this. Please advise

## 2020-10-11 NOTE — Telephone Encounter (Signed)
Spoke to [place] at diagnostic center. She stated she had called the pt and left her a message to call back to schedule appt, but had not heard from pt. I called pt and gave her this info, as well as told her to call the office and ask for [facility]. She stated she is going to call them

## 2020-10-28 DIAGNOSIS — G8929 Other chronic pain: Secondary | ICD-10-CM | POA: Diagnosis not present

## 2020-10-28 DIAGNOSIS — M5441 Lumbago with sciatica, right side: Secondary | ICD-10-CM | POA: Diagnosis not present

## 2020-10-28 DIAGNOSIS — M48062 Spinal stenosis, lumbar region with neurogenic claudication: Secondary | ICD-10-CM | POA: Diagnosis not present

## 2020-10-28 DIAGNOSIS — M5442 Lumbago with sciatica, left side: Secondary | ICD-10-CM | POA: Diagnosis not present

## 2020-11-11 ENCOUNTER — Ambulatory Visit: Payer: Medicare PPO | Admitting: Family Medicine

## 2020-11-11 ENCOUNTER — Encounter: Payer: Self-pay | Admitting: Family Medicine

## 2020-11-11 ENCOUNTER — Other Ambulatory Visit: Payer: Self-pay

## 2020-11-11 VITALS — BP 112/70 | HR 76 | Ht 63.0 in | Wt 180.0 lb

## 2020-11-11 DIAGNOSIS — Z23 Encounter for immunization: Secondary | ICD-10-CM | POA: Diagnosis not present

## 2020-11-11 DIAGNOSIS — E7800 Pure hypercholesterolemia, unspecified: Secondary | ICD-10-CM

## 2020-11-11 DIAGNOSIS — M810 Age-related osteoporosis without current pathological fracture: Secondary | ICD-10-CM

## 2020-11-11 DIAGNOSIS — E119 Type 2 diabetes mellitus without complications: Secondary | ICD-10-CM | POA: Diagnosis not present

## 2020-11-11 DIAGNOSIS — I10 Essential (primary) hypertension: Secondary | ICD-10-CM | POA: Diagnosis not present

## 2020-11-11 MED ORDER — METFORMIN HCL 500 MG PO TABS: ORAL_TABLET | ORAL | 1 refills | Status: DC

## 2020-11-11 MED ORDER — ALENDRONATE SODIUM 70 MG PO TABS: ORAL_TABLET | ORAL | 1 refills | Status: DC

## 2020-11-11 MED ORDER — PIOGLITAZONE HCL 15 MG PO TABS: 15.0000 mg | ORAL_TABLET | Freq: Every day | ORAL | 1 refills | Status: DC

## 2020-11-11 MED ORDER — SIMVASTATIN 40 MG PO TABS: ORAL_TABLET | ORAL | 1 refills | Status: DC

## 2020-11-11 MED ORDER — GLIPIZIDE ER 5 MG PO TB24: 5.0000 mg | ORAL_TABLET | Freq: Every day | ORAL | 1 refills | Status: DC

## 2020-11-11 MED ORDER — LOSARTAN POTASSIUM 25 MG PO TABS: 25.0000 mg | ORAL_TABLET | Freq: Every day | ORAL | 1 refills | Status: DC

## 2020-11-11 NOTE — Progress Notes (Signed)
Date:  11/11/2020   Name:  Lindsey George   DOB:  Jun 25, 1951   MRN:  [id number]   Chief Complaint: Diabetes, Hypertension, Hyperlipidemia, osteopenia (Taking fosamax weekly), and Flu Vaccine  Diabetes She presents for her follow-up diabetic visit. She has type 2 diabetes mellitus. Her disease course has been stable. There are no hypoglycemic associated symptoms. Pertinent negatives for hypoglycemia include no dizziness, headaches, nervousness/anxiousness or sweats. There are no diabetic associated symptoms. Pertinent negatives for diabetes include no blurred vision, no chest pain, no fatigue, no foot paresthesias, no foot ulcerations, no polydipsia, no polyphagia, no polyuria, no visual change, no weakness and no weight loss. There are no hypoglycemic complications. Symptoms are stable. There are no diabetic complications. Pertinent negatives for diabetic complications include no CVA, PVD or retinopathy. Risk factors for coronary artery disease include dyslipidemia, diabetes mellitus and hypertension. Current diabetic treatment includes oral agent (triple therapy). She is compliant with treatment some of the time. Her weight is stable. She is following a generally healthy diet. Meal planning includes avoidance of concentrated sweets and carbohydrate counting. She participates in exercise intermittently. There is no change in her home blood glucose trend. Her breakfast blood glucose is taken between 8-9 am. Her breakfast blood glucose range is generally 90-110 mg/dl. An ACE inhibitor/angiotensin II receptor blocker is being taken.  Hypertension This is a chronic problem. The current episode started more than 1 year ago. The problem is unchanged. The problem is controlled. Pertinent negatives include no anxiety, blurred vision, chest pain, headaches, malaise/fatigue, neck pain, orthopnea, palpitations, peripheral edema, PND, shortness of breath or sweats. There are no associated agents to  hypertension. Risk factors for coronary artery disease include dyslipidemia, diabetes mellitus and obesity. Past treatments include angiotensin blockers. The current treatment provides moderate improvement. There are no compliance problems.  There is no history of angina, kidney disease, CAD/MI, CVA, heart failure, left ventricular hypertrophy, PVD or retinopathy. There is no history of chronic renal disease, a hypertension causing med or renovascular disease.  Hyperlipidemia This is a chronic problem. The current episode started more than 1 year ago. The problem is controlled. Recent lipid tests were reviewed and are normal. Exacerbating diseases include obesity. She has no history of chronic renal disease, diabetes, hypothyroidism, liver disease or nephrotic syndrome. Pertinent negatives include no chest pain, focal sensory loss, focal weakness, leg pain, myalgias or shortness of breath. Current antihyperlipidemic treatment includes statins. The current treatment provides moderate improvement of lipids. There are no compliance problems.  Risk factors for coronary artery disease include diabetes mellitus, dyslipidemia and hypertension.    Lab Results  Component Value Date   CREATININE 0.53 (L) 03/06/2020   BUN 16 03/06/2020   NA 138 03/06/2020   K 4.3 03/06/2020   CL 99 03/06/2020   CO2 24 03/06/2020   Lab Results  Component Value Date   CHOL 128 03/06/2020   HDL 48 03/06/2020   LDLCALC 58 03/06/2020   TRIG 127 03/06/2020   CHOLHDL 2.9 09/19/2018   No results found for: TSH Lab Results  Component Value Date   HGBA1C 6.8 (H) 07/11/2020   Lab Results  Component Value Date   WBC 12.1 (H) 08/11/2018   HGB 12.9 08/11/2018   HCT 40.8 08/11/2018   MCV 80.5 08/11/2018   PLT 394 08/11/2018   Lab Results  Component Value Date   ALT 14 03/06/2020   AST 17 03/06/2020   ALKPHOS 50 03/06/2020   BILITOT 0.5 03/06/2020  Review of Systems  Constitutional: Negative for chills, fatigue,  fever, malaise/fatigue and weight loss.  HENT: Negative for drooling, ear discharge, ear pain, postnasal drip and sore throat.   Eyes: Negative for blurred vision.  Respiratory: Negative for cough, shortness of breath and wheezing.   Cardiovascular: Negative for chest pain, palpitations, orthopnea, leg swelling and PND.  Gastrointestinal: Negative for abdominal pain, blood in stool, constipation, diarrhea and nausea.  Endocrine: Negative for polydipsia, polyphagia and polyuria.  Genitourinary: Negative for dysuria, frequency, hematuria and urgency.  Musculoskeletal: Negative for back pain, myalgias and neck pain.  Skin: Negative for rash.  Allergic/Immunologic: Negative for environmental allergies.  Neurological: Negative for dizziness, focal weakness, weakness and headaches.  Hematological: Does not bruise/bleed easily.  Psychiatric/Behavioral: Negative for suicidal ideas. The patient is not nervous/anxious.     Patient Active Problem List   Diagnosis Date Noted  . Essential hypertension 03/06/2020  . Cervical radiculopathy 03/06/2020  . Asymptomatic varicose veins of both lower extremities 03/06/2020  . Morbid obesity ([place]) 03/29/2019  . Allergic rhinitis 01/19/2018  . Hx of adenomatous colonic polyps 01/14/2018  . Reactive airway disease without complication 70/96/2836  . Back ache 05/22/2015  . Alimentary obesity 05/22/2015  . Type 2 diabetes mellitus without complication, without long-term current use of insulin ([place]) 04/12/2014  . Hypercholesterolemia 04/12/2014  . OP (osteoporosis) 04/12/2014    Allergies  Allergen Reactions  . Codeine Other (See Comments)    Past Surgical History:  Procedure Laterality Date  . ABDOMINAL HYSTERECTOMY    . BREAST BIOPSY Right 06/22/11   bx/clip-neg  . BREAST CYST ASPIRATION Left    neg  . COLONOSCOPY  2014   cleared for 5 yrs- [place] doc  . COLONOSCOPY WITH PROPOFOL N/A 03/25/2018   Procedure: COLONOSCOPY WITH PROPOFOL;  Surgeon:  Manya Silvas, MD;  Location: [facility] ENDOSCOPY;  Service: Endoscopy;  Laterality: N/A;  . PARTIAL HYSTERECTOMY    . SPLENECTOMY      Social History   Tobacco Use  . Smoking status: Never Smoker  . Smokeless tobacco: Never Used  . Tobacco comment: smoking cessation materials not required  Vaping Use  . Vaping Use: Never used  Substance Use Topics  . Alcohol use: No    Alcohol/week: 0.0 standard drinks  . Drug use: No     Medication list has been reviewed and updated.  Current Meds  Medication Sig  . albuterol (PROVENTIL HFA;VENTOLIN HFA) 108 (90 Base) MCG/ACT inhaler Inhale 2 puffs into the lungs every 6 (six) hours as needed for wheezing or shortness of breath.  Marland Kitchen albuterol (PROVENTIL) (2.5 MG/3ML) 0.083% nebulizer solution Take 3 mLs (2.5 mg total) by nebulization every 6 (six) hours as needed for wheezing or shortness of breath.  Marland Kitchen alendronate (FOSAMAX) 70 MG tablet TAKE 1 TABLET BY MOUTH ONCE A WEEK. TAKE WITH A FULL GLASS OF WATER ON AN EMPTY STOMACH.  Marland Kitchen aspirin 81 MG tablet Take 81 mg by mouth daily.  . calcium-vitamin D (OSCAL WITH D) 500-200 MG-UNIT tablet Take 1 tablet by mouth.  . Cholecalciferol (VITAMIN D3) 10 MCG (400 UNIT) CAPS   . cyclobenzaprine (FLEXERIL) 10 MG tablet Take by mouth. mundy  . EPIPEN 2-PAK 0.3 MG/0.3ML SOAJ injection   . gabapentin (NEURONTIN) 100 MG capsule Take 200 mg by mouth daily. mundy  . glipiZIDE (GLIPIZIDE XL) 5 MG 24 hr tablet Take 1 tablet (5 mg total) by mouth daily.  Marland Kitchen loratadine (CLARITIN) 10 MG tablet Take 10 mg by mouth daily. otc  .  losartan (COZAAR) 25 MG tablet Take 1 tablet by mouth once daily  . metFORMIN (GLUCOPHAGE) 500 MG tablet TAKE ONE TABLET BY MOUTH TWICE DAILY  . Multiple Vitamin (MULTIVITAMIN) capsule Take 1 capsule by mouth daily.  Glory Rosebush ULTRA test strip USE 1 STRIP TO CHECK GLUCOSE ONCE DAILY  . pioglitazone (ACTOS) 15 MG tablet Take 1 tablet (15 mg total) by mouth daily.  . simvastatin (ZOCOR) 40 MG tablet  Take 1 tablet by mouth once daily   Current Facility-Administered Medications for the 11/11/20 encounter (Office Visit) with Juline Patch, MD  Medication  . albuterol (PROVENTIL) (2.5 MG/3ML) 0.083% nebulizer solution 2.5 mg  . ipratropium-albuterol (DUONEB) 0.5-2.5 (3) MG/3ML nebulizer solution 3 mL    PHQ 2/9 Scores 11/11/2020 07/11/2020 06/10/2020 04/22/2020  PHQ - 2 Score 0 0 0 0  PHQ- 9 Score 0 1 - 0    GAD 7 : Generalized Anxiety Score 11/11/2020 07/11/2020 04/22/2020 03/06/2020  Nervous, Anxious, on Edge 0 0 0 0  Control/stop worrying 0 0 0 0  Worry too much - different things 0 0 0 0  Trouble relaxing 0 0 0 0  Restless 0 0 0 0  Easily annoyed or irritable 0 0 0 0  Afraid - awful might happen 0 0 0 0  Total GAD 7 Score 0 0 0 0    BP Readings from Last 3 Encounters:  11/11/20 112/70  10/08/20 130/70  07/11/20 120/68    Physical Exam Vitals and nursing note reviewed.  Constitutional:      Appearance: Normal appearance. She is well-developed and well-nourished.  HENT:     Head: Normocephalic.     Right Ear: Tympanic membrane, ear canal and external ear normal. There is no impacted cerumen.     Left Ear: Tympanic membrane, ear canal and external ear normal. There is no impacted cerumen.     Nose: Nose normal. No congestion or rhinorrhea.     Mouth/Throat:     Mouth: Oropharynx is clear and moist. Mucous membranes are moist.  Eyes:     General: Lids are everted, no foreign bodies appreciated. No scleral icterus.       Left eye: No foreign body or hordeolum.     Extraocular Movements: EOM normal.     Conjunctiva/sclera: Conjunctivae normal.     Right eye: Right conjunctiva is not injected.     Left eye: Left conjunctiva is not injected.     Pupils: Pupils are equal, round, and reactive to light.  Neck:     Thyroid: No thyromegaly.     Vascular: No JVD.     Trachea: No tracheal deviation.  Cardiovascular:     Rate and Rhythm: Normal rate and regular rhythm.      Pulses: Intact distal pulses.     Heart sounds: Normal heart sounds. No murmur heard. No friction rub. No gallop.   Pulmonary:     Effort: Pulmonary effort is normal. No respiratory distress.     Breath sounds: Normal breath sounds. No wheezing, rhonchi or rales.  Abdominal:     General: Bowel sounds are normal.     Palpations: Abdomen is soft. There is no hepatosplenomegaly or mass.     Tenderness: There is no abdominal tenderness. There is no guarding or rebound.  Musculoskeletal:        General: No tenderness or edema. Normal range of motion.     Cervical back: Normal range of motion and neck supple.  Lymphadenopathy:  Cervical: No cervical adenopathy.  Skin:    General: Skin is warm.     Findings: No rash.  Neurological:     Mental Status: She is alert and oriented to person, place, and time.     Cranial Nerves: No cranial nerve deficit.     Deep Tendon Reflexes: Strength normal. Reflexes normal.  Psychiatric:        Mood and Affect: Mood and affect normal. Mood is not anxious or depressed.     Wt Readings from Last 3 Encounters:  11/11/20 180 lb (81.6 kg)  10/08/20 182 lb (82.6 kg)  07/11/20 184 lb (83.5 kg)    BP 112/70   Pulse 76   Ht 5\' 3"  (1.6 m)   Wt 180 lb (81.6 kg)   BMI 31.89 kg/m   Assessment and Plan: Patient's chart was reviewed for the last encounter.  Most recent labs, most recent imaging, and care everywhere. 1. Type 2 diabetes mellitus without complication, without long-term current use of insulin (HCC) Chronic.  Controlled.  Stable.  Continue pioglitazone 15 mg once a day and Metformin 500 mg 1 twice a day and glipizide XL 5 mg once a day.  Will check A1c and renal function panel. - HgB A1c - Renal Function Panel - glipiZIDE (GLIPIZIDE XL) 5 MG 24 hr tablet; Take 1 tablet (5 mg total) by mouth daily.  Dispense: 90 tablet; Refill: 1 - metFORMIN (GLUCOPHAGE) 500 MG tablet; TAKE ONE TABLET BY MOUTH TWICE DAILY  Dispense: 180 tablet; Refill: 1 -  pioglitazone (ACTOS) 15 MG tablet; Take 1 tablet (15 mg total) by mouth daily.  Dispense: 90 tablet; Refill: 1  2. Essential hypertension Chronic.  Controlled.  Stable.  Blood pressure today is 112/70 continue losartan 25 mg once a day.  Continue losartan 25 mg once a day.  Will check renal function panel. - Renal Function Panel - losartan (COZAAR) 25 MG tablet; Take 1 tablet (25 mg total) by mouth daily.  Dispense: 90 tablet; Refill: 1  3. Hypercholesterolemia Chronic.  Controlled.  Stable.  Continue simvastatin 40 mg once a day. - simvastatin (ZOCOR) 40 MG tablet; Take 1 tablet by mouth once daily  Dispense: 90 tablet; Refill: 1  4. Age-related osteoporosis without current pathological fracture Chronic.  Controlled.  Stable.  Continue Fosamax 70 mg 1 a week. - alendronate (FOSAMAX) 70 MG tablet; TAKE 1 TABLET BY MOUTH ONCE A WEEK. TAKE WITH A FULL GLASS OF WATER ON AN EMPTY STOMACH.  Dispense: 12 tablet; Refill: 1  5. Need for immunization against influenza Discussed and administered. - Flu Vaccine QUAD High Dose(Fluad)

## 2020-11-12 LAB — HEMOGLOBIN A1C
Est. average glucose Bld gHb Est-mCnc: 134 mg/dL
Hgb A1c MFr Bld: 6.3 % — ABNORMAL HIGH (ref 4.8–5.6)

## 2020-11-12 LAB — RENAL FUNCTION PANEL
Albumin: 4.9 g/dL — ABNORMAL HIGH (ref 3.8–4.8)
BUN/Creatinine Ratio: 21 (ref 12–28)
BUN: 13 mg/dL (ref 8–27)
CO2: 26 mmol/L (ref 20–29)
Calcium: 10.5 mg/dL — ABNORMAL HIGH (ref 8.7–10.3)
Chloride: 97 mmol/L (ref 96–106)
Creatinine, Ser: 0.61 mg/dL (ref 0.57–1.00)
GFR calc Af Amer: 106 mL/min/{1.73_m2} (ref >59)
GFR calc non Af Amer: 92 mL/min/{1.73_m2} (ref >59)
Glucose: 107 mg/dL — ABNORMAL HIGH (ref 65–99)
Phosphorus: 4.1 mg/dL (ref 3.0–4.3)
Potassium: 4.6 mmol/L (ref 3.5–5.2)
Sodium: 138 mmol/L (ref 134–144)

## 2020-11-18 ENCOUNTER — Ambulatory Visit: Payer: Medicare Other | Admitting: Internal Medicine

## 2020-11-19 DIAGNOSIS — H2513 Age-related nuclear cataract, bilateral: Secondary | ICD-10-CM | POA: Diagnosis not present

## 2020-11-19 LAB — HM DIABETES EYE EXAM

## 2020-11-25 DIAGNOSIS — M5442 Lumbago with sciatica, left side: Secondary | ICD-10-CM | POA: Diagnosis not present

## 2020-11-25 DIAGNOSIS — M48062 Spinal stenosis, lumbar region with neurogenic claudication: Secondary | ICD-10-CM | POA: Diagnosis not present

## 2020-11-25 DIAGNOSIS — M5441 Lumbago with sciatica, right side: Secondary | ICD-10-CM | POA: Diagnosis not present

## 2020-11-25 DIAGNOSIS — E119 Type 2 diabetes mellitus without complications: Secondary | ICD-10-CM | POA: Diagnosis not present

## 2020-12-09 DIAGNOSIS — M48062 Spinal stenosis, lumbar region with neurogenic claudication: Secondary | ICD-10-CM | POA: Diagnosis not present

## 2020-12-09 DIAGNOSIS — G8929 Other chronic pain: Secondary | ICD-10-CM | POA: Diagnosis not present

## 2020-12-09 DIAGNOSIS — M5442 Lumbago with sciatica, left side: Secondary | ICD-10-CM | POA: Diagnosis not present

## 2020-12-09 DIAGNOSIS — M5441 Lumbago with sciatica, right side: Secondary | ICD-10-CM | POA: Diagnosis not present

## 2020-12-09 DIAGNOSIS — M79642 Pain in left hand: Secondary | ICD-10-CM | POA: Diagnosis not present

## 2020-12-09 DIAGNOSIS — R208 Other disturbances of skin sensation: Secondary | ICD-10-CM | POA: Diagnosis not present

## 2020-12-12 DIAGNOSIS — E6609 Other obesity due to excess calories: Secondary | ICD-10-CM | POA: Diagnosis not present

## 2020-12-12 DIAGNOSIS — M25542 Pain in joints of left hand: Secondary | ICD-10-CM | POA: Diagnosis not present

## 2020-12-12 DIAGNOSIS — E119 Type 2 diabetes mellitus without complications: Secondary | ICD-10-CM | POA: Diagnosis not present

## 2020-12-12 DIAGNOSIS — Z6832 Body mass index (BMI) 32.0-32.9, adult: Secondary | ICD-10-CM | POA: Diagnosis not present

## 2020-12-12 DIAGNOSIS — G5602 Carpal tunnel syndrome, left upper limb: Secondary | ICD-10-CM | POA: Diagnosis not present

## 2020-12-24 DIAGNOSIS — M5442 Lumbago with sciatica, left side: Secondary | ICD-10-CM | POA: Diagnosis not present

## 2020-12-24 DIAGNOSIS — G8929 Other chronic pain: Secondary | ICD-10-CM | POA: Diagnosis not present

## 2020-12-24 DIAGNOSIS — M5441 Lumbago with sciatica, right side: Secondary | ICD-10-CM | POA: Diagnosis not present

## 2020-12-24 DIAGNOSIS — M48062 Spinal stenosis, lumbar region with neurogenic claudication: Secondary | ICD-10-CM | POA: Diagnosis not present

## 2020-12-24 DIAGNOSIS — M6281 Muscle weakness (generalized): Secondary | ICD-10-CM | POA: Diagnosis not present

## 2021-01-01 DIAGNOSIS — M48062 Spinal stenosis, lumbar region with neurogenic claudication: Secondary | ICD-10-CM | POA: Diagnosis not present

## 2021-01-01 DIAGNOSIS — M5441 Lumbago with sciatica, right side: Secondary | ICD-10-CM | POA: Diagnosis not present

## 2021-01-01 DIAGNOSIS — M6281 Muscle weakness (generalized): Secondary | ICD-10-CM | POA: Diagnosis not present

## 2021-01-01 DIAGNOSIS — G8929 Other chronic pain: Secondary | ICD-10-CM | POA: Diagnosis not present

## 2021-01-01 DIAGNOSIS — M5442 Lumbago with sciatica, left side: Secondary | ICD-10-CM | POA: Diagnosis not present

## 2021-01-09 DIAGNOSIS — M79602 Pain in left arm: Secondary | ICD-10-CM | POA: Diagnosis not present

## 2021-01-16 ENCOUNTER — Other Ambulatory Visit: Payer: Self-pay | Admitting: Orthopedic Surgery

## 2021-01-16 DIAGNOSIS — M542 Cervicalgia: Secondary | ICD-10-CM | POA: Diagnosis not present

## 2021-01-16 DIAGNOSIS — M25542 Pain in joints of left hand: Secondary | ICD-10-CM

## 2021-01-16 DIAGNOSIS — E119 Type 2 diabetes mellitus without complications: Secondary | ICD-10-CM | POA: Diagnosis not present

## 2021-01-16 DIAGNOSIS — E6609 Other obesity due to excess calories: Secondary | ICD-10-CM | POA: Diagnosis not present

## 2021-01-16 DIAGNOSIS — Z6832 Body mass index (BMI) 32.0-32.9, adult: Secondary | ICD-10-CM | POA: Diagnosis not present

## 2021-01-17 ENCOUNTER — Ambulatory Visit
Admission: RE | Admit: 2021-01-17 | Discharge: 2021-01-17 | Disposition: A | Discharge status: Home / Self Care | Payer: Medicare PPO | Source: Ambulatory Visit | Attending: Orthopedic Surgery | Admitting: Orthopedic Surgery

## 2021-01-17 ENCOUNTER — Other Ambulatory Visit: Payer: Self-pay

## 2021-01-17 DIAGNOSIS — M25542 Pain in joints of left hand: Secondary | ICD-10-CM | POA: Diagnosis not present

## 2021-01-17 DIAGNOSIS — M79642 Pain in left hand: Secondary | ICD-10-CM | POA: Diagnosis not present

## 2021-01-17 DIAGNOSIS — M7989 Other specified soft tissue disorders: Secondary | ICD-10-CM | POA: Diagnosis not present

## 2021-03-11 ENCOUNTER — Telehealth: Payer: Self-pay

## 2021-03-11 NOTE — Telephone Encounter (Signed)
Called patient to relay message

## 2021-03-11 NOTE — Telephone Encounter (Signed)
Copied from [place] [id number]. Topic: General - Other >> Mar 11, 2021  9:17 AM Lindsey George wrote: Reason for CRM: Patient called in to say that she is experiencing pain in both her hands and taking Gabapentin but not helping asking if she can be seen today please. Also would like George call just to know she is not able to do 03/13/21 9.00 AM have George dental appointment Ph# [phone]

## 2021-03-11 NOTE — Telephone Encounter (Signed)
Please tell her Lindsey George prescribes this med- need to call and get in with him [phone] or [phone]

## 2021-03-18 ENCOUNTER — Ambulatory Visit: Payer: Medicare PPO | Admitting: Family Medicine

## 2021-03-18 ENCOUNTER — Encounter: Payer: Self-pay | Admitting: Family Medicine

## 2021-03-18 ENCOUNTER — Other Ambulatory Visit: Payer: Self-pay

## 2021-03-18 VITALS — BP 130/80 | HR 88 | Ht 63.0 in | Wt 181.0 lb

## 2021-03-18 DIAGNOSIS — F959 Tic disorder, unspecified: Secondary | ICD-10-CM

## 2021-03-18 DIAGNOSIS — E119 Type 2 diabetes mellitus without complications: Secondary | ICD-10-CM | POA: Diagnosis not present

## 2021-03-18 MED ORDER — PIOGLITAZONE HCL 15 MG PO TABS: 15.0000 mg | ORAL_TABLET | Freq: Every day | ORAL | 1 refills | Status: DC

## 2021-03-18 MED ORDER — METFORMIN HCL 500 MG PO TABS: ORAL_TABLET | ORAL | 1 refills | Status: DC

## 2021-03-18 MED ORDER — GLIPIZIDE ER 5 MG PO TB24: 5.0000 mg | ORAL_TABLET | Freq: Every day | ORAL | 1 refills | Status: DC

## 2021-03-18 NOTE — Progress Notes (Signed)
Date:  03/18/2021   Name:  Lindsey George   DOB:  05-Oct-1951   MRN:  [id number]     Lab Results  Component Value Date   CREATININE 0.61 11/11/2020   BUN 13 11/11/2020   NA 138 11/11/2020   K 4.6 11/11/2020   CL 97 11/11/2020   CO2 26 11/11/2020   Lab Results  Component Value Date   CHOL 128 03/06/2020   HDL 48 03/06/2020   LDLCALC 58 03/06/2020   TRIG 127 03/06/2020   CHOLHDL 2.9 09/19/2018   No results found for: TSH Lab Results  Component Value Date   HGBA1C 6.3 (H) 11/11/2020   Lab Results  Component Value Date   WBC 12.1 (H) 08/11/2018   HGB 12.9 08/11/2018   HCT 40.8 08/11/2018   MCV 80.5 08/11/2018   PLT 394 08/11/2018   Lab Results  Component Value Date   ALT 14 03/06/2020   AST 17 03/06/2020   ALKPHOS 50 03/06/2020   BILITOT 0.5 03/06/2020     Review of Systems  Constitutional: Negative.  Negative for chills, fatigue, fever, unexpected weight change and weight loss.  HENT: Negative for congestion, ear discharge, ear pain, rhinorrhea, sinus pressure, sneezing and sore throat.   Eyes: Negative for blurred vision, photophobia, pain, discharge, redness and itching.  Respiratory: Negative for cough, shortness of breath, wheezing and stridor.   Cardiovascular: Negative for chest pain and near-syncope.  Gastrointestinal: Negative for abdominal pain, blood in stool, constipation, diarrhea, nausea and vomiting.  Endocrine: Negative for cold intolerance, heat intolerance, polydipsia, polyphagia and polyuria.  Genitourinary: Negative for dysuria, flank pain, frequency, hematuria, menstrual problem, pelvic pain, urgency, vaginal bleeding and vaginal discharge.  Musculoskeletal: Negative for arthralgias, back pain and myalgias.  Skin: Negative for rash.  Allergic/Immunologic: Negative for environmental allergies and food allergies.  Neurological: Negative for dizziness, focal weakness, syncope, weakness, light-headedness, numbness, headaches and loss of  balance.  Hematological: Negative for adenopathy. Does not bruise/bleed easily.  Psychiatric/Behavioral: Negative for dysphoric mood and memory loss. The patient is not nervous/anxious.     Patient Active Problem List   Diagnosis Date Noted  . Essential hypertension 03/06/2020  . Cervical radiculopathy 03/06/2020  . Asymptomatic varicose veins of both lower extremities 03/06/2020  . Morbid obesity ([place]) 03/29/2019  . Allergic rhinitis 01/19/2018  . Hx of adenomatous colonic polyps 01/14/2018  . Reactive airway disease without complication 85/27/7824  . Back ache 05/22/2015  . Alimentary obesity 05/22/2015  . Type 2 diabetes mellitus without complication, without long-term current use of insulin ([place]) 04/12/2014  . Hypercholesterolemia 04/12/2014  . OP (osteoporosis) 04/12/2014    Allergies  Allergen Reactions  . Codeine Other (See Comments)    Past Surgical History:  Procedure Laterality Date  . ABDOMINAL HYSTERECTOMY    . BREAST BIOPSY Right 06/22/11   bx/clip-neg  . BREAST CYST ASPIRATION Left    neg  . COLONOSCOPY  2014   cleared for 5 yrs- [place] doc  . COLONOSCOPY WITH PROPOFOL N/A 03/25/2018   Procedure: COLONOSCOPY WITH PROPOFOL;  Surgeon: Manya Silvas, MD;  Location: [facility] ENDOSCOPY;  Service: Endoscopy;  Laterality: N/A;  . PARTIAL HYSTERECTOMY    . SPLENECTOMY      Social History   Tobacco Use  . Smoking status: Never Smoker  . Smokeless tobacco: Never Used  . Tobacco comment: smoking cessation materials not required  Vaping Use  . Vaping Use: Never used  Substance Use Topics  . Alcohol use: No  Alcohol/week: 0.0 standard drinks  . Drug use: No     Medication list has been reviewed and updated.  Current Meds  Medication Sig  . albuterol (PROVENTIL HFA;VENTOLIN HFA) 108 (90 Base) MCG/ACT inhaler Inhale 2 puffs into the lungs every 6 (six) hours as needed for wheezing or shortness of breath.  Marland Kitchen albuterol (PROVENTIL) (2.5 MG/3ML) 0.083% nebulizer  solution Take 3 mLs (2.5 mg total) by nebulization every 6 (six) hours as needed for wheezing or shortness of breath.  Marland Kitchen alendronate (FOSAMAX) 70 MG tablet TAKE 1 TABLET BY MOUTH ONCE A WEEK. TAKE WITH A FULL GLASS OF WATER ON AN EMPTY STOMACH.  Marland Kitchen aspirin 81 MG tablet Take 81 mg by mouth daily.  . calcium-vitamin D (OSCAL WITH D) 500-200 MG-UNIT tablet Take 1 tablet by mouth.  . Cholecalciferol (VITAMIN D3) 10 MCG (400 UNIT) CAPS   . cyclobenzaprine (FLEXERIL) 10 MG tablet Take by mouth. mundy  . EPIPEN 2-PAK 0.3 MG/0.3ML SOAJ injection   . gabapentin (NEURONTIN) 100 MG capsule Take 200 mg by mouth daily. mundy  . glipiZIDE (GLIPIZIDE XL) 5 MG 24 hr tablet Take 1 tablet (5 mg total) by mouth daily.  Marland Kitchen loratadine (CLARITIN) 10 MG tablet Take 10 mg by mouth daily. otc  . losartan (COZAAR) 25 MG tablet Take 1 tablet (25 mg total) by mouth daily.  . metFORMIN (GLUCOPHAGE) 500 MG tablet TAKE ONE TABLET BY MOUTH TWICE DAILY  . Multiple Vitamin (MULTIVITAMIN) capsule Take 1 capsule by mouth daily.  Glory Rosebush ULTRA test strip USE 1 STRIP TO CHECK GLUCOSE ONCE DAILY  . pioglitazone (ACTOS) 15 MG tablet Take 1 tablet (15 mg total) by mouth daily.  . simvastatin (ZOCOR) 40 MG tablet Take 1 tablet by mouth once daily   Current Facility-Administered Medications for the 03/18/21 encounter (Office Visit) with Juline Patch, MD  Medication  . albuterol (PROVENTIL) (2.5 MG/3ML) 0.083% nebulizer solution 2.5 mg  . ipratropium-albuterol (DUONEB) 0.5-2.5 (3) MG/3ML nebulizer solution 3 mL    PHQ 2/9 Scores 11/11/2020 07/11/2020 06/10/2020 04/22/2020  PHQ - 2 Score 0 0 0 0  PHQ- 9 Score 0 1 - 0    GAD 7 : Generalized Anxiety Score 11/11/2020 07/11/2020 04/22/2020 03/06/2020  Nervous, Anxious, on Edge 0 0 0 0  Control/stop worrying 0 0 0 0  Worry too much - different things 0 0 0 0  Trouble relaxing 0 0 0 0  Restless 0 0 0 0  Easily annoyed or irritable 0 0 0 0  Afraid - awful might happen 0 0 0 0  Total  GAD 7 Score 0 0 0 0    BP Readings from Last 3 Encounters:  03/18/21 130/80  11/11/20 112/70  10/08/20 130/70    Physical Exam Vitals and nursing note reviewed.  Constitutional:      Appearance: She is well-developed.  HENT:     Head: Normocephalic.     Right Ear: External ear normal.     Left Ear: External ear normal.     Nose: Nose normal.     Mouth/Throat:     Mouth: Mucous membranes are moist.  Eyes:     General: Lids are everted, no foreign bodies appreciated. No scleral icterus.       Left eye: No foreign body or hordeolum.     Conjunctiva/sclera: Conjunctivae normal.     Right eye: Right conjunctiva is not injected.     Left eye: Left conjunctiva is not injected.  Pupils: Pupils are equal, round, and reactive to light.  Neck:     Thyroid: No thyromegaly.     Vascular: No JVD.     Trachea: No tracheal deviation.  Cardiovascular:     Rate and Rhythm: Normal rate and regular rhythm.     Heart sounds: Normal heart sounds. No murmur heard. No friction rub. No gallop.   Pulmonary:     Effort: Pulmonary effort is normal. No respiratory distress.     Breath sounds: Normal breath sounds. No wheezing, rhonchi or rales.  Abdominal:     General: Bowel sounds are normal.     Palpations: Abdomen is soft. There is no mass.     Tenderness: There is no abdominal tenderness. There is no guarding or rebound.  Musculoskeletal:        General: No tenderness. Normal range of motion.     Cervical back: Normal range of motion and neck supple.  Lymphadenopathy:     Cervical: No cervical adenopathy.  Skin:    General: Skin is warm.     Findings: No rash.  Neurological:     Mental Status: She is alert and oriented to person, place, and time.     Cranial Nerves: No cranial nerve deficit.     Deep Tendon Reflexes: Reflexes normal.  Psychiatric:        Mood and Affect: Mood is not anxious or depressed.     Wt Readings from Last 3 Encounters:  03/18/21 181 lb (82.1 kg)   11/11/20 180 lb (81.6 kg)  10/08/20 182 lb (82.6 kg)    BP 130/80   Pulse 88   Ht 5\' 3"  (1.6 m)   Wt 181 lb (82.1 kg)   BMI 32.06 kg/m   Assessment and Plan: 1. Type 2 diabetes mellitus without complication, without long-term current use of insulin (HCC)  Chronic.  Controlled.  Stable.  Fasting blood sugars in the 140s to 150s range.  Patient will continue metformin 500 mg twice a day glipizide XL 5 mg once a day and pioglitazone 15 mg once a day.  We will obtain an A1c and microalbuminuria at this time.  We will recheck patient in 4 months. - HgB A1c - Microalbumin, urine - pioglitazone (ACTOS) 15 MG tablet; Take 1 tablet (15 mg total) by mouth daily.  Dispense: 90 tablet; Refill: 1 - glipiZIDE (GLIPIZIDE XL) 5 MG 24 hr tablet; Take 1 tablet (5 mg total) by mouth daily.  Dispense: 90 tablet; Refill: 1 - metFORMIN (GLUCOPHAGE) 500 MG tablet; TAKE ONE TABLET BY MOUTH TWICE DAILY  Dispense: 180 tablet; Refill: 1  2. Facial tic New problem.  Persistent.  Gradually worsening.  Patient has had a gradually worsening of an involuntary movement of her facial area.  This is either a tremor or a tic of unknown etiology.  Patient on review of medications I do not see any medications that may be contributing to this.  We will refer to neurology for evaluation and treatment. - Ambulatory referral to Neurology

## 2021-03-19 ENCOUNTER — Other Ambulatory Visit: Payer: Self-pay | Admitting: Family Medicine

## 2021-03-19 DIAGNOSIS — M25542 Pain in joints of left hand: Secondary | ICD-10-CM | POA: Diagnosis not present

## 2021-03-19 DIAGNOSIS — E119 Type 2 diabetes mellitus without complications: Secondary | ICD-10-CM | POA: Diagnosis not present

## 2021-03-19 DIAGNOSIS — Z1231 Encounter for screening mammogram for malignant neoplasm of breast: Secondary | ICD-10-CM

## 2021-03-19 LAB — HEMOGLOBIN A1C
Est. average glucose Bld gHb Est-mCnc: 131 mg/dL
Hgb A1c MFr Bld: 6.2 % — ABNORMAL HIGH (ref 4.8–5.6)

## 2021-03-19 LAB — MICROALBUMIN, URINE: Microalbumin, Urine: 133.4 ug/mL

## 2021-03-31 ENCOUNTER — Other Ambulatory Visit: Payer: Self-pay

## 2021-03-31 ENCOUNTER — Encounter: Payer: Self-pay | Admitting: Occupational Therapy

## 2021-03-31 ENCOUNTER — Ambulatory Visit: Payer: Medicare PPO | Attending: Orthopedic Surgery | Admitting: Occupational Therapy

## 2021-03-31 DIAGNOSIS — M25642 Stiffness of left hand, not elsewhere classified: Secondary | ICD-10-CM | POA: Diagnosis not present

## 2021-03-31 DIAGNOSIS — M6281 Muscle weakness (generalized): Secondary | ICD-10-CM | POA: Insufficient documentation

## 2021-03-31 DIAGNOSIS — R600 Localized edema: Secondary | ICD-10-CM | POA: Diagnosis not present

## 2021-03-31 DIAGNOSIS — M79642 Pain in left hand: Secondary | ICD-10-CM | POA: Diagnosis not present

## 2021-03-31 DIAGNOSIS — M79641 Pain in right hand: Secondary | ICD-10-CM | POA: Insufficient documentation

## 2021-03-31 NOTE — Patient Instructions (Signed)
Contrast to do 3 x day Soft issue mobs by husband for [facility] spreads and webspace  Pain free tendon glides - 10 reps And opposition to all digits- focus on 0 5-8 reps  pain free  Ed and hand out provided for joint protection principles

## 2021-03-31 NOTE — Therapy (Signed)
[facility] [address] [place], [address] Phone: [phone]   Fax:  [phone]  Occupational Therapy Evaluation  Patient Details  Name: Lindsey George MRN: [id number] Date of Birth: 19-Jun-1951 Referring Provider (OT): Greig Castilla   Encounter Date: 03/31/2021   OT End of Session - 03/31/21 1235    Visit Number 1    Number of Visits 5    Date for OT Re-Evaluation 04/21/21    OT Start Time 1115    OT Stop Time 1204    OT Time Calculation (min) 49 min    Activity Tolerance Patient tolerated treatment well    Behavior During Therapy [facility] for tasks assessed/performed           Past Medical History:  Diagnosis Date  . Asthma   . Cancer ([place])    skin ca  . Chronic back pain   . Diabetes mellitus without complication ([place])   . Hyperlipidemia   . Hypertension   . Obesity   . Osteoporosis     Past Surgical History:  Procedure Laterality Date  . ABDOMINAL HYSTERECTOMY    . BREAST BIOPSY Right 06/22/11   bx/clip-neg  . BREAST CYST ASPIRATION Left    neg  . COLONOSCOPY  2014   cleared for 5 yrs- [place] doc  . COLONOSCOPY WITH PROPOFOL N/A 03/25/2018   Procedure: COLONOSCOPY WITH PROPOFOL;  Surgeon: Manya Silvas, MD;  Location: [facility] ENDOSCOPY;  Service: Endoscopy;  Laterality: N/A;  . PARTIAL HYSTERECTOMY    . SPLENECTOMY      There were no vitals filed for this visit.      [facility] OT Assessment - 03/31/21 0001      Assessment   Medical Diagnosis L hand pain and edema >R    Referring Provider (OT) Cresenciano Genre Mundy    Onset Date/Surgical Date 07/31/20    Hand Dominance Right      Home  Environment   Lives With Spouse      Prior Function   Vocation Retired    Leisure flowers in garden, Teaching laboratory technician and house work/cooking      AROM   Right Wrist Extension 70 Degrees    Right Wrist Flexion 80 Degrees    Left Wrist Extension 70 Degrees    Left Wrist Flexion 60 Degrees      Strength    Right Hand Grip (lbs) 40    Right Hand Lateral Pinch 10 lbs    Right Hand 3 Point Pinch 10 lbs    Left Hand Grip (lbs) 27    Left Hand Lateral Pinch 8 lbs    Left Hand 3 Point Pinch 6 lbs      Right Hand AROM   R Thumb MCP 0-60 45 Degrees    R Thumb IP 0-80 70 Degrees    R Thumb Opposition to Index --   Opposition to base of 5th - come pain   R Index  MCP 0-90 80 Degrees    R Index PIP 0-100 100 Degrees    R Long  MCP 0-90 85 Degrees    R Long PIP 0-100 100 Degrees    R Ring  MCP 0-90 85 Degrees    R Ring PIP 0-100 100 Degrees    R Little  MCP 0-90 90 Degrees    R Little PIP 0-100 100 Degrees      Left Hand AROM   L Thumb MCP 0-60 45 Degrees  L Thumb IP 0-80 55 Degrees    L Thumb Opposition to Index --   opposition to 4th , 5th pain , pull over thumb   L Index  MCP 0-90 75 Degrees    L Index PIP 0-100 100 Degrees    L Long  MCP 0-90 75 Degrees    L Long PIP 0-100 100 Degrees    L Ring  MCP 0-90 80 Degrees    L Ring PIP 0-100 95 Degrees    L Little  MCP 0-90 85 Degrees    L Little PIP 0-100 95 Degrees              Contrast to do 3 x day Use isotoner glove night time and some during day   Soft issue mobs by husband for [facility] spreads and webspace  Pain free tendon glides - 10 reps And opposition to all digits- focus on 0 5-8 reps  pain free  Ed and hand out provided for joint protection principles               OT Education - 03/31/21 1235    Education Details findings of eval and HEP    Person(s) Educated Patient    Methods Explanation;Demonstration;Tactile cues;Verbal cues;Handout    Comprehension Verbal cues required;Returned demonstration;Verbalized understanding               OT Long Term Goals - 03/31/21 1242      OT LONG TERM GOAL #1   Title Pt to  show decrease pain in L hand with gripping objects in ADL's    Baseline pain with composite fist increase to 4/10 over MC's    Time 3    Period Weeks    Status New    Target Date 04/21/21       OT LONG TERM GOAL #2   Title Pt's L hand digits AROM increase to WNL including thumb to grip objects with pain less than 4/10    Baseline pain increase to 4/10 with gripping , MC's R 75-80 , PIP's 95 on 4th an d5th    Time 5    Period Weeks    Status New    Target Date 05/05/21      OT LONG TERM GOAL #3   Title L grip  increase with 5 lbs and prehension strength with 1-2 lbs to feel like she is dropping objects less    Baseline Grip R 40, L 27 lbs - lat grip and 3 point 10 lbs R , L 8 and 6 lbs    Time 5    Period Weeks    Status New    Target Date 05/05/21                 Plan - 03/31/21 1236    Clinical Impression Statement Pt present at OT eval with bilateral hand pain and edema - L worse than R - pt report having issues since around summer last year but got worse gradually - nerve conduction test negative for CT , MRI and xray showed no issues- and per pt blood work negative for RA- pt report pain 1-4/10 lately - with some pain over thumb and mostly over MC's and dorsal hand with composite fist- pt had tenderness over volar plate of all MC- and pain with  composite fist and opposition to 5th- decrease grip and prehension for her age on L more thanR - with most of edema over R MC's - pt has  isotoner glove - pt limited in use of hands in ADl's and IADl's- pt report doing take out or frozen meals mostly the last few months- discuss with pt sodium and nutrients in takeout - with her having some inflammation issues and being DM - pt can benefit from OT services to decrease edema, pain and increase ROM and strength in bilateral hands and L wrist    OT Occupational Profile and History Problem Focused Assessment - Including review of records relating to presenting problem    Occupational performance deficits (Please refer to evaluation for details): ADL's;IADL's;Play;Leisure    Body Structure / Function / Physical Skills ADL;Coordination;Edema;Strength;Pain;UE functional use;ROM;IADL     Rehab Potential Fair    Clinical Decision Making Limited treatment options, no task modification necessary   doing takeout and frozen meals, DM   Comorbidities Affecting Occupational Performance: None    Modification or Assistance to Complete Evaluation  No modification of tasks or assist necessary to complete eval    OT Frequency 1x / week    OT Duration --   5 wks   OT Treatment/Interventions Self-care/ADL training;Fluidtherapy;Contrast Bath;DME and/or AE instruction;Manual Therapy;Patient/family education;Therapeutic exercise    Consulted and Agree with Plan of Care Patient           Patient will benefit from skilled therapeutic intervention in order to improve the following deficits and impairments:   Body Structure / Function / Physical Skills: ADL,Coordination,Edema,Strength,Pain,UE functional use,ROM,IADL       Visit Diagnosis: Localized edema - Plan: Ot plan of care cert/re-cert  Pain in left hand - Plan: Ot plan of care cert/re-cert  Pain in right hand - Plan: Ot plan of care cert/re-cert  Stiffness of left hand, not elsewhere classified - Plan: Ot plan of care cert/re-cert  Muscle weakness (generalized) - Plan: Ot plan of care cert/re-cert    Problem List Patient Active Problem List   Diagnosis Date Noted  . Essential hypertension 03/06/2020  . Cervical radiculopathy 03/06/2020  . Asymptomatic varicose veins of both lower extremities 03/06/2020  . Morbid obesity ([place]) 03/29/2019  . Allergic rhinitis 01/19/2018  . Hx of adenomatous colonic polyps 01/14/2018  . Reactive airway disease without complication 33/54/5625  . Back ache 05/22/2015  . Alimentary obesity 05/22/2015  . Type 2 diabetes mellitus without complication, without long-term current use of insulin ([place]) 04/12/2014  . Hypercholesterolemia 04/12/2014  . OP (osteoporosis) 04/12/2014    Rosalyn Gess OTR/L,CLT 03/31/2021, 12:48 PM  [facility] [address], [place], [address] Phone: [phone]   Fax:  [phone]  Name: HALLY COLELLA MRN: [id number] Date of Birth: [birth year]

## 2021-04-07 ENCOUNTER — Ambulatory Visit: Payer: Medicare PPO | Admitting: Occupational Therapy

## 2021-04-07 ENCOUNTER — Other Ambulatory Visit: Payer: Self-pay

## 2021-04-07 DIAGNOSIS — R600 Localized edema: Secondary | ICD-10-CM | POA: Diagnosis not present

## 2021-04-07 DIAGNOSIS — M79642 Pain in left hand: Secondary | ICD-10-CM | POA: Diagnosis not present

## 2021-04-07 DIAGNOSIS — M79641 Pain in right hand: Secondary | ICD-10-CM | POA: Diagnosis not present

## 2021-04-07 DIAGNOSIS — M6281 Muscle weakness (generalized): Secondary | ICD-10-CM | POA: Diagnosis not present

## 2021-04-07 DIAGNOSIS — M25642 Stiffness of left hand, not elsewhere classified: Secondary | ICD-10-CM

## 2021-04-07 NOTE — Therapy (Signed)
[facility] [address] [place], [address] Phone: [phone]   Fax:  [phone]  Occupational Therapy Treatment  Patient Details  Name: Lindsey George MRN: [id number] Date of Birth: Oct 15, 1951 Referring Provider (OT): Greig Castilla   Encounter Date: 04/07/2021   OT End of Session - 04/07/21 1009    Visit Number 2    Number of Visits 5    Date for OT Re-Evaluation 04/21/21    OT Start Time 0949    OT Stop Time 1025    OT Time Calculation (min) 36 min    Activity Tolerance Patient tolerated treatment well    Behavior During Therapy [facility] for tasks assessed/performed           Past Medical History:  Diagnosis Date  . Asthma   . Cancer ([place])    skin ca  . Chronic back pain   . Diabetes mellitus without complication ([place])   . Hyperlipidemia   . Hypertension   . Obesity   . Osteoporosis     Past Surgical History:  Procedure Laterality Date  . ABDOMINAL HYSTERECTOMY    . BREAST BIOPSY Right 06/22/11   bx/clip-neg  . BREAST CYST ASPIRATION Left    neg  . COLONOSCOPY  2014   cleared for 5 yrs- [place] doc  . COLONOSCOPY WITH PROPOFOL N/A 03/25/2018   Procedure: COLONOSCOPY WITH PROPOFOL;  Surgeon: Manya Silvas, MD;  Location: [facility] ENDOSCOPY;  Service: Endoscopy;  Laterality: N/A;  . PARTIAL HYSTERECTOMY    . SPLENECTOMY      There were no vitals filed for this visit.   Subjective Assessment - 04/07/21 1005    Subjective  Done the hot and cold - exercises - 2 x day - or maybe more- husband did the massage - once he did - or I did myself - but pain is better not as constant and not  as sharp    Currently in Pain? Yes    Pain Score 1     Pain Location Hand    Pain Orientation Left    Pain Descriptors / Indicators Stabbing    Pain Type Chronic pain              [facility] OT Assessment - 04/07/21 0001      AROM   Right Wrist Extension 70 Degrees    Right Wrist Flexion 80 Degrees     Left Wrist Extension 70 Degrees    Left Wrist Flexion 80 Degrees      Strength   Right Hand Grip (lbs) 40    Right Hand Lateral Pinch 12 lbs    Right Hand 3 Point Pinch 11 lbs    Left Hand Grip (lbs) 35    Left Hand Lateral Pinch 9 lbs    Left Hand 3 Point Pinch 9 lbs      Right Hand AROM   R Index  MCP 0-90 80 Degrees    R Index PIP 0-100 100 Degrees    R Long  MCP 0-90 85 Degrees    R Long PIP 0-100 100 Degrees    R Ring  MCP 0-90 85 Degrees    R Ring PIP 0-100 100 Degrees    R Little  MCP 0-90 90 Degrees    R Little PIP 0-100 100 Degrees      Left Hand AROM   L Thumb MCP 0-60 50 Degrees    L Thumb IP 0-80 65 Degrees  L Index  MCP 0-90 80 Degrees    L Index PIP 0-100 95 Degrees    L Long  MCP 0-90 80 Degrees    L Long PIP 0-100 100 Degrees    L Ring  MCP 0-90 80 Degrees    L Ring PIP 0-100 100 Degrees    L Little  MCP 0-90 90 Degrees    L Little PIP 0-100 95 Degrees           Made great progress in pain , AROM and grip/prehension - see flowsheet         OT Treatments/Exercises (OP) - 04/07/21 0001      LUE Contrast Bath   Time 8 minutes    Comments contrast prior to sof tissue              Contrast to do 3 x day still Use isotoner glove night time and some during day   Soft issue mobs by OT MC and carpal spread- as well as webspace - reinforce again for husband to help her  Pain free tendon glides - 10 reps reinforce not to force And opposition to all digits- focus on 0 10 repsreps  pain free And wrist flexion, extention- 10 reps   REview again joint protection principles - hand out provided last time       OT Education - 04/07/21 1009    Education Details progress and HEP    Person(s) Educated Patient    Methods Explanation;Demonstration;Tactile cues;Verbal cues;Handout    Comprehension Verbal cues required;Returned demonstration;Verbalized understanding               OT Long Term Goals - 03/31/21 1242      OT LONG TERM  GOAL #1   Title Pt to  show decrease pain in L hand with gripping objects in ADL's    Baseline pain with composite fist increase to 4/10 over MC's    Time 3    Period Weeks    Status New    Target Date 04/21/21      OT LONG TERM GOAL #2   Title Pt's L hand digits AROM increase to WNL including thumb to grip objects with pain less than 4/10    Baseline pain increase to 4/10 with gripping , MC's R 75-80 , PIP's 95 on 4th an d5th    Time 5    Period Weeks    Status New    Target Date 05/05/21      OT LONG TERM GOAL #3   Title L grip  increase with 5 lbs and prehension strength with 1-2 lbs to feel like she is dropping objects less    Baseline Grip R 40, L 27 lbs - lat grip and 3 point 10 lbs R , L 8 and 6 lbs    Time 5    Period Weeks    Status New    Target Date 05/05/21                 Plan - 04/07/21 1010    Clinical Impression Statement Pt present at OT eval with few months of  bilateral hand pain and edema - L worse than R - pt report having issues since around summer last year but got worse gradually - nerve conduction test negative for CT , MRI and xray showed no issues- and per pt blood work negative for RA- pt report pain 1/10 coming in -  report pian improve and only now  in the webspace of 2nd and 3rd - pt cont to have tenderness over  4th A1pulley - reviewed again tendon glides -but need to be painfree-  showed this date increase digits flexion , ewrist flexion and increase grip and prehension - pt has isotoner glove - pt limited in use of hands in ADl's and IADl's- pt report doing take out or frozen meals mostly the last few months- discuss with pt sodium and nutrients in takeout again - with her having some inflammation issues and being DM - pt can benefit from OT services to decrease edema, pain and increase ROM and strength in bilateral hands and L wrist    OT Occupational Profile and History Problem Focused Assessment - Including review of records relating to  presenting problem    Occupational performance deficits (Please refer to evaluation for details): ADL's;IADL's;Play;Leisure    Body Structure / Function / Physical Skills ADL;Coordination;Edema;Strength;Pain;UE functional use;ROM;IADL    Rehab Potential Fair    Clinical Decision Making Limited treatment options, no task modification necessary    Comorbidities Affecting Occupational Performance: None    Modification or Assistance to Complete Evaluation  No modification of tasks or assist necessary to complete eval    OT Frequency 1x / week    OT Duration 4 weeks    OT Treatment/Interventions Self-care/ADL training;Fluidtherapy;Contrast Bath;DME and/or AE instruction;Manual Therapy;Patient/family education;Therapeutic exercise    Consulted and Agree with Plan of Care Patient           Patient will benefit from skilled therapeutic intervention in order to improve the following deficits and impairments:   Body Structure / Function / Physical Skills: ADL,Coordination,Edema,Strength,Pain,UE functional use,ROM,IADL       Visit Diagnosis: Pain in left hand  Pain in right hand  Stiffness of left hand, not elsewhere classified  Muscle weakness (generalized)  Localized edema    Problem List Patient Active Problem List   Diagnosis Date Noted  . Essential hypertension 03/06/2020  . Cervical radiculopathy 03/06/2020  . Asymptomatic varicose veins of both lower extremities 03/06/2020  . Morbid obesity ([place]) 03/29/2019  . Allergic rhinitis 01/19/2018  . Hx of adenomatous colonic polyps 01/14/2018  . Reactive airway disease without complication 09/38/1829  . Back ache 05/22/2015  . Alimentary obesity 05/22/2015  . Type 2 diabetes mellitus without complication, without long-term current use of insulin ([place]) 04/12/2014  . Hypercholesterolemia 04/12/2014  . OP (osteoporosis) 04/12/2014    Rosalyn Gess  OTR/L,CLT 04/07/2021, 10:51 AM  [facility] [address], [place], [address] Phone: [phone]   Fax:  [phone]  Name: Lindsey George MRN: [id number] Date of Birth: [birth year]

## 2021-04-09 ENCOUNTER — Ambulatory Visit: Payer: Medicare PPO | Admitting: Occupational Therapy

## 2021-04-09 DIAGNOSIS — R413 Other amnesia: Secondary | ICD-10-CM | POA: Diagnosis not present

## 2021-04-09 DIAGNOSIS — F959 Tic disorder, unspecified: Secondary | ICD-10-CM | POA: Diagnosis not present

## 2021-04-09 DIAGNOSIS — R251 Tremor, unspecified: Secondary | ICD-10-CM | POA: Diagnosis not present

## 2021-04-15 ENCOUNTER — Other Ambulatory Visit: Payer: Self-pay

## 2021-04-15 ENCOUNTER — Ambulatory Visit: Payer: Medicare PPO | Admitting: Occupational Therapy

## 2021-04-15 DIAGNOSIS — M25642 Stiffness of left hand, not elsewhere classified: Secondary | ICD-10-CM

## 2021-04-15 DIAGNOSIS — R600 Localized edema: Secondary | ICD-10-CM

## 2021-04-15 DIAGNOSIS — M6281 Muscle weakness (generalized): Secondary | ICD-10-CM | POA: Diagnosis not present

## 2021-04-15 DIAGNOSIS — M79642 Pain in left hand: Secondary | ICD-10-CM | POA: Diagnosis not present

## 2021-04-15 DIAGNOSIS — M79641 Pain in right hand: Secondary | ICD-10-CM | POA: Diagnosis not present

## 2021-04-15 NOTE — Therapy (Signed)
[facility] [address] [place], [address] Phone: [phone]   Fax:  [phone]  Occupational Therapy Treatment  Patient Details  Name: Lindsey George MRN: [id number] Date of Birth: 02/06/1951 Referring Provider (OT): Greig Castilla   Encounter Date: 04/15/2021   OT End of Session - 04/15/21 1054    Visit Number 3    Number of Visits 5    Date for OT Re-Evaluation 04/21/21    OT Start Time 1030    OT Stop Time 1113    OT Time Calculation (min) 43 min    Activity Tolerance Patient tolerated treatment well    Behavior During Therapy [facility] for tasks assessed/performed           Past Medical History:  Diagnosis Date  . Asthma   . Cancer ([place])    skin ca  . Chronic back pain   . Diabetes mellitus without complication ([place])   . Hyperlipidemia   . Hypertension   . Obesity   . Osteoporosis     Past Surgical History:  Procedure Laterality Date  . ABDOMINAL HYSTERECTOMY    . BREAST BIOPSY Right 06/22/11   bx/clip-neg  . BREAST CYST ASPIRATION Left    neg  . COLONOSCOPY  2014   cleared for 5 yrs- [place] doc  . COLONOSCOPY WITH PROPOFOL N/A 03/25/2018   Procedure: COLONOSCOPY WITH PROPOFOL;  Surgeon: Manya Silvas, MD;  Location: [facility] ENDOSCOPY;  Service: Endoscopy;  Laterality: N/A;  . PARTIAL HYSTERECTOMY    . SPLENECTOMY      There were no vitals filed for this visit.   Subjective Assessment - 04/15/21 1046    Subjective  No throbbing pain - but hyper sensitivity still in that webspace between index and middle - no pain shooting or throbbing anymore- but doing HEP and hot/cold , glove and modifying how you grip objects or use hand    Currently in Pain? Yes    Pain Score 5     Pain Orientation Left    Pain Descriptors / Indicators --   hyper sensitivty   Pain Type Neuropathic pain    Pain Onset More than a month ago    Pain Frequency Constant              [facility] OT Assessment -  04/15/21 0001      Strength   Right Hand Grip (lbs) 40    Right Hand Lateral Pinch 16 lbs    Right Hand 3 Point Pinch 14 lbs    Left Hand Grip (lbs) 38    Left Hand Lateral Pinch 12 lbs    Left Hand 3 Point Pinch 9 lbs      Left Hand AROM   L Index  MCP 0-90 85 Degrees    L Index PIP 0-100 95 Degrees    L Long  MCP 0-90 85 Degrees    L Long PIP 0-100 100 Degrees    L Ring  MCP 0-90 90 Degrees    L Ring PIP 0-100 95 Degrees    L Little  MCP 0-90 90 Degrees    L Little PIP 0-100 100 Degrees            AROM in bilateral hands improve to [facility] - and grip and lat grip increase greatly - 3 point still decrease -but pt report some hyper sensitivity in webspace of 2nd and 3rd  Pt ed on desentitization - massage, soft and  rougher textures and tapping- to do several times during day  As well as done vibration - did not bother pt  Tenderness over A1 pulley of 4th improved         OT Treatments/Exercises (OP) - 04/15/21 0001      LUE Contrast Bath   Time 8 minutes    Comments contrast prior to soft tissue nad ROM           Contrast to do 2- 3 x day still Use isotoner glove night time and some during dayas needed  Soft issue mobs by OT [facility] and carpal spread- as well as webspace - reinforce again for husband to help her  Pain free tendon glides - 10 reps reinforce not to force And opposition to all digits- focus on 0 10 repsreps pain free And wrist flexion, extention- 10 reps  And add Med N glide 5 reps - to HEP   REview again joint protection principles- hand out provided at eval        OT Education - 04/15/21 1054    Education Details progress and HEP    Person(s) Educated Patient    Methods Explanation;Demonstration;Tactile cues;Verbal cues;Handout    Comprehension Verbal cues required;Returned demonstration;Verbalized understanding               OT Long Term Goals - 03/31/21 1242      OT LONG TERM GOAL #1   Title Pt to  show decrease pain in L  hand with gripping objects in ADL's    Baseline pain with composite fist increase to 4/10 over MC's    Time 3    Period Weeks    Status New    Target Date 04/21/21      OT LONG TERM GOAL #2   Title Pt's L hand digits AROM increase to WNL including thumb to grip objects with pain less than 4/10    Baseline pain increase to 4/10 with gripping , MC's R 75-80 , PIP's 95 on 4th an d5th    Time 5    Period Weeks    Status New    Target Date 05/05/21      OT LONG TERM GOAL #3   Title L grip  increase with 5 lbs and prehension strength with 1-2 lbs to feel like she is dropping objects less    Baseline Grip R 40, L 27 lbs - lat grip and 3 point 10 lbs R , L 8 and 6 lbs    Time 5    Period Weeks    Status New    Target Date 05/05/21                 Plan - 04/15/21 1054    Clinical Impression Statement Pt presented at OT eval 3 wks ago with few months of  bilateral hand pain and edema - L worse than R - pt report having issues since around summer last year but got worse gradually - nerve conduction test negative for CT , MRI and xray showed no issues- and per pt blood work negative for RA-. Pt made great progress in 2 session of OT - pain decrease to only now hyper sensitivity between 2nd and 3rd webspace - and pt show AROM in L hand same as R hand - WFL -and grip increase 11 lbs , prehension strength in crease more in lat and 3 point grip - pt to cont with HEP to decrease pain and desentitization in 2nd and  3rd webspace-  pt tenderness over  4th A1pulley improved - reviewed again tendon glides and add Med N glide to HEP  - pt has isotoner glove - pt report at eval doing take out or frozen meals mostly the last few months- discuss with pt sodium and nutrients in takeout again - with her having some inflammation issues and being DM - pt can benefit from OT services to decrease edema, pain and increase ROM and strength in bilateral hands and L wrist    OT Occupational Profile and History Problem  Focused Assessment - Including review of records relating to presenting problem    Occupational performance deficits (Please refer to evaluation for details): ADL's;IADL's;Play;Leisure    Body Structure / Function / Physical Skills ADL;Coordination;Edema;Strength;Pain;UE functional use;ROM;IADL    Rehab Potential Fair    Clinical Decision Making Limited treatment options, no task modification necessary    Comorbidities Affecting Occupational Performance: None    Modification or Assistance to Complete Evaluation  No modification of tasks or assist necessary to complete eval    OT Frequency 1x / week    OT Duration 4 weeks    OT Treatment/Interventions Self-care/ADL training;Fluidtherapy;Contrast Bath;DME and/or AE instruction;Manual Therapy;Patient/family education;Therapeutic exercise    Consulted and Agree with Plan of Care Patient           Patient will benefit from skilled therapeutic intervention in order to improve the following deficits and impairments:   Body Structure / Function / Physical Skills: ADL,Coordination,Edema,Strength,Pain,UE functional use,ROM,IADL       Visit Diagnosis: Pain in left hand  Pain in right hand  Stiffness of left hand, not elsewhere classified  Localized edema    Problem List Patient Active Problem List   Diagnosis Date Noted  . Essential hypertension 03/06/2020  . Cervical radiculopathy 03/06/2020  . Asymptomatic varicose veins of both lower extremities 03/06/2020  . Morbid obesity ([place]) 03/29/2019  . Allergic rhinitis 01/19/2018  . Hx of adenomatous colonic polyps 01/14/2018  . Reactive airway disease without complication 40/97/3532  . Back ache 05/22/2015  . Alimentary obesity 05/22/2015  . Type 2 diabetes mellitus without complication, without long-term current use of insulin ([place]) 04/12/2014  . Hypercholesterolemia 04/12/2014  . OP (osteoporosis) 04/12/2014    Rosalyn Gess OTR/L,CLT 04/15/2021, 12:22 PM  [place] [facility] [address], [place], [address] Phone: [phone]   Fax:  [phone]  Name: Lindsey George MRN: [id number] Date of Birth: Apr 24, 1951

## 2021-04-16 DIAGNOSIS — E6609 Other obesity due to excess calories: Secondary | ICD-10-CM | POA: Diagnosis not present

## 2021-04-16 DIAGNOSIS — Z6832 Body mass index (BMI) 32.0-32.9, adult: Secondary | ICD-10-CM | POA: Diagnosis not present

## 2021-04-16 DIAGNOSIS — M778 Other enthesopathies, not elsewhere classified: Secondary | ICD-10-CM | POA: Diagnosis not present

## 2021-04-16 DIAGNOSIS — M25542 Pain in joints of left hand: Secondary | ICD-10-CM | POA: Diagnosis not present

## 2021-04-16 DIAGNOSIS — M4807 Spinal stenosis, lumbosacral region: Secondary | ICD-10-CM | POA: Diagnosis not present

## 2021-04-16 DIAGNOSIS — E119 Type 2 diabetes mellitus without complications: Secondary | ICD-10-CM | POA: Diagnosis not present

## 2021-04-29 ENCOUNTER — Other Ambulatory Visit: Payer: Self-pay

## 2021-04-29 ENCOUNTER — Ambulatory Visit: Payer: Medicare PPO | Admitting: Occupational Therapy

## 2021-04-29 DIAGNOSIS — M6281 Muscle weakness (generalized): Secondary | ICD-10-CM

## 2021-04-29 DIAGNOSIS — M25642 Stiffness of left hand, not elsewhere classified: Secondary | ICD-10-CM | POA: Diagnosis not present

## 2021-04-29 DIAGNOSIS — M79641 Pain in right hand: Secondary | ICD-10-CM

## 2021-04-29 DIAGNOSIS — R600 Localized edema: Secondary | ICD-10-CM | POA: Diagnosis not present

## 2021-04-29 DIAGNOSIS — M79642 Pain in left hand: Secondary | ICD-10-CM

## 2021-04-29 NOTE — Therapy (Signed)
[facility] [address] [place], [address] Phone: [phone]   Fax:  [phone]  Occupational Therapy Treatment/discharge  Patient Details  Name: Lindsey George MRN: [id number] Date of Birth: 04-Dec-1950 Referring Provider (OT): Greig Castilla   Encounter Date: 04/29/2021   OT End of Session - 04/29/21 1100    Visit Number 4    Number of Visits 4    Date for OT Re-Evaluation 04/29/21    OT Start Time 1033    OT Stop Time 1059    OT Time Calculation (min) 26 min    Activity Tolerance Patient tolerated treatment well    Behavior During Therapy [facility] for tasks assessed/performed           Past Medical History:  Diagnosis Date  . Asthma   . Cancer ([place])    skin ca  . Chronic back pain   . Diabetes mellitus without complication ([place])   . Hyperlipidemia   . Hypertension   . Obesity   . Osteoporosis     Past Surgical History:  Procedure Laterality Date  . ABDOMINAL HYSTERECTOMY    . BREAST BIOPSY Right 06/22/11   bx/clip-neg  . BREAST CYST ASPIRATION Left    neg  . COLONOSCOPY  2014   cleared for 5 yrs- [place] doc  . COLONOSCOPY WITH PROPOFOL N/A 03/25/2018   Procedure: COLONOSCOPY WITH PROPOFOL;  Surgeon: Manya Silvas, MD;  Location: [facility] ENDOSCOPY;  Service: Endoscopy;  Laterality: N/A;  . PARTIAL HYSTERECTOMY    . SPLENECTOMY      There were no vitals filed for this visit.   Subjective Assessment - 04/29/21 1057    Subjective  I seen Dr Brynda Greathouse and have appt with neurology in week or 2- You helped me so much - doing much better- my hands do not hurt and I have more strength - uding them bette- and area between my index and middle finger that was hyper sensitive is better and smaller    Currently in Pain? No/denies              [facility] OT Assessment - 04/29/21 0001      AROM   Right Wrist Extension 70 Degrees    Right Wrist Flexion 80 Degrees    Left Wrist Extension 70 Degrees     Left Wrist Flexion 80 Degrees      Strength   Right Hand Grip (lbs) 44    Right Hand Lateral Pinch 15 lbs    Right Hand 3 Point Pinch 14 lbs    Left Hand Grip (lbs) 42    Left Hand Lateral Pinch 11 lbs    Left Hand 3 Point Pinch 10 lbs      Right Hand AROM   R Thumb Opposition to Index --   opposition to base of 5th   R Index  MCP 0-90 80 Degrees    R Index PIP 0-100 100 Degrees    R Long  MCP 0-90 85 Degrees    R Long PIP 0-100 100 Degrees    R Ring  MCP 0-90 85 Degrees    R Ring PIP 0-100 100 Degrees    R Little  MCP 0-90 90 Degrees    R Little PIP 0-100 100 Degrees      Left Hand AROM   L Thumb Opposition to Index --   opposition to base of 5th   L Index  MCP 0-90 85 Degrees  L Index PIP 0-100 95 Degrees    L Long  MCP 0-90 85 Degrees    L Long PIP 0-100 100 Degrees    L Ring  MCP 0-90 90 Degrees    L Ring PIP 0-100 95 Degrees    L Little  MCP 0-90 90 Degrees    L Little PIP 0-100 100 Degrees              AROM in bilateral hands improve to [facility] - and grip and lat grip increase  Greatly again - now WNL for her age  pt report some hyper sensitivity in webspace of 2nd and 3rd - but also improving Pt ed on desentitization - massage, soft and rougher textures and tapping- to do several times during day   Tenderness over A1 pulley of 4th improved   Contrast to cont with  2- 3 x daystill Use isotoner glove night time and some during dayas needed  Pt to cont with Pain free tendon glides - 10 repsreinforce not to force And opposition to all digits- focus on 010 repsreps pain free And wrist flexion, extention- 10 reps And Med N glide 5 reps  Until hyper sensitivity improve   REview againjoint protection principles- hand out                 OT Education - 04/29/21 1059    Education Details progress and HEP/ discharge instructions    Person(s) Educated Patient    Methods Explanation;Demonstration;Tactile cues;Verbal cues;Handout     Comprehension Verbal cues required;Returned demonstration;Verbalized understanding               OT Long Term Goals - 04/29/21 1104      OT LONG TERM GOAL #1   Title Pt to  show decrease pain in L hand with gripping objects in ADL's    Baseline pain with composite fist increase to 4/10 over MC's - NOW no pain with gripping or use    Status Achieved      OT LONG TERM GOAL #2   Title Pt's L hand digits AROM increase to WNL including thumb to grip objects with pain less than 4/10    Baseline pain increase to 4/10 with gripping , MC's R 75-80 , PIP's 95 on 4th an d5th  NOW AROM WFL and  no pain    Status Achieved      OT LONG TERM GOAL #3   Title L grip  increase with 5 lbs and prehension strength with 1-2 lbs to feel like she is dropping objects less    Baseline Grip R 40, L 27 lbs - lat grip and 3 point 10 lbs R , L 8 and 6 lbs   NOW grip R 44, L 42 , lat and 3 point grip increase to 14-15 lbs R , L more than 2 lbs    Status Achieved                 Plan - 04/29/21 1100    Clinical Impression Statement Pt was seen for 4 visiits and made great progress in bilateral hand pain,edema, AROM and grip/prehension strength - bilateral hands now WNL for her age.Nerve conduction test was negative for CT , MRI and xray showed no issues in past - and per pt blood work negative for RA-. She sitll has area of hyper sensitivity between 2nd and 3rd webspace but smaller every time she comes. Pt met all goals and to cont with homeprogram.  OT Occupational Profile and History Problem Focused Assessment - Including review of records relating to presenting problem    Occupational performance deficits (Please refer to evaluation for details): ADL's;IADL's;Play;Leisure    Body Structure / Function / Physical Skills ADL;Coordination;Edema;Strength;Pain;UE functional use;ROM;IADL    Rehab Potential Fair    Clinical Decision Making Limited treatment options, no task modification necessary     Comorbidities Affecting Occupational Performance: None    Modification or Assistance to Complete Evaluation  No modification of tasks or assist necessary to complete eval    OT Treatment/Interventions Self-care/ADL training;Fluidtherapy;Contrast Bath;DME and/or AE instruction;Manual Therapy;Patient/family education;Therapeutic exercise    Consulted and Agree with Plan of Care Patient           Patient will benefit from skilled therapeutic intervention in order to improve the following deficits and impairments:   Body Structure / Function / Physical Skills: ADL,Coordination,Edema,Strength,Pain,UE functional use,ROM,IADL       Visit Diagnosis: Pain in left hand  Pain in right hand  Stiffness of left hand, not elsewhere classified  Muscle weakness (generalized)  Localized edema    Problem List Patient Active Problem List   Diagnosis Date Noted  . Essential hypertension 03/06/2020  . Cervical radiculopathy 03/06/2020  . Asymptomatic varicose veins of both lower extremities 03/06/2020  . Morbid obesity ([place]) 03/29/2019  . Allergic rhinitis 01/19/2018  . Hx of adenomatous colonic polyps 01/14/2018  . Reactive airway disease without complication 70/62/3762  . Back ache 05/22/2015  . Alimentary obesity 05/22/2015  . Type 2 diabetes mellitus without complication, without long-term current use of insulin ([place]) 04/12/2014  . Hypercholesterolemia 04/12/2014  . OP (osteoporosis) 04/12/2014    Rosalyn Gess OTR/L,CLT 04/29/2021, 11:12 AM  [facility] [address] [place], [address] Phone: [phone]   Fax:  [phone]  Name: Lindsey George MRN: [id number] Date of Birth: 11-Jan-1951

## 2021-05-13 DIAGNOSIS — F959 Tic disorder, unspecified: Secondary | ICD-10-CM | POA: Diagnosis not present

## 2021-05-13 DIAGNOSIS — G3184 Mild cognitive impairment, so stated: Secondary | ICD-10-CM | POA: Diagnosis not present

## 2021-05-13 DIAGNOSIS — R251 Tremor, unspecified: Secondary | ICD-10-CM | POA: Diagnosis not present

## 2021-05-14 ENCOUNTER — Other Ambulatory Visit: Payer: Self-pay | Admitting: Physician Assistant

## 2021-05-14 DIAGNOSIS — I639 Cerebral infarction, unspecified: Secondary | ICD-10-CM

## 2021-05-21 ENCOUNTER — Encounter: Payer: Self-pay | Admitting: Family Medicine

## 2021-05-21 ENCOUNTER — Other Ambulatory Visit: Payer: Self-pay

## 2021-05-21 ENCOUNTER — Ambulatory Visit: Payer: Medicare PPO | Admitting: Family Medicine

## 2021-05-21 VITALS — BP 128/76 | HR 88 | Ht 63.0 in | Wt 185.0 lb

## 2021-05-21 DIAGNOSIS — H9313 Tinnitus, bilateral: Secondary | ICD-10-CM | POA: Diagnosis not present

## 2021-05-21 DIAGNOSIS — H6123 Impacted cerumen, bilateral: Secondary | ICD-10-CM | POA: Diagnosis not present

## 2021-05-21 MED ORDER — CARBAMIDE PEROXIDE 6.5 % OT SOLN: 5.0000 [drp] | Freq: Two times a day (BID) | OTIC | 0 refills | Status: DC

## 2021-05-21 NOTE — Patient Instructions (Signed)
Tinnitus Tinnitus refers to hearing a sound when there is no actual source for that sound. This is often described as ringing in the ears. However, people withthis condition may hear a variety of noises, in one ear or in both ears. The sounds of tinnitus can be soft, loud, or somewhere in between. Tinnitus can last for a few seconds or can be constant for days. It may go away without treatment and come back at various times. When tinnitus is constant or happens often, it can lead to other problems, such as trouble sleeping and troubleconcentrating. Almost everyone experiences tinnitus at some point. Tinnitus is not the same as hearing loss. Tinnitus that is long-lasting (chronic) or comes back often (recurs) may require medical attention. What are the causes? The cause of tinnitus is often not known. In some cases, it can result from: Exposure to loud noises from machinery, music, or other sources. An object (foreign body) stuck in the ear. Earwax buildup. Drinking alcohol or caffeine. Taking certain medicines. Age-related hearing loss. It may also be caused by medical conditions such as: Ear or sinus infections. Heart diseases or high blood pressure. Allergies. Mnire's disease. Thyroid problems. Tumors. A weak, bulging blood vessel (aneurysm) near the ear. What increases the risk? The following factors may make you more likely to develop this condition: Exposure to loud noises. Age. Tinnitus is more likely in older individuals. Using alcohol or tobacco. What are the signs or symptoms? The main symptom of tinnitus is hearing a sound when there is no source for that sound. It may sound like: Buzzing. Sizzling. Ringing. Blowing air. Hissing. Whistling. Other sounds may include: Roaring. Running water. A musical note. Tapping. Humming. Symptoms may affect only one ear (unilateral) or both ears (bilateral). How is this diagnosed? Tinnitus is diagnosed based on your symptoms,  your medical history, and a physical exam. Your health care provider may do a thorough hearing test (audiologic exam) if your tinnitus: Is unilateral. Causes hearing difficulties. Lasts 6 months or longer. You may work with a health care provider who specializes in hearing disorders (audiologist). You may be asked questions about your symptoms and how they affect your daily life. You may have other tests done, such as: CT scan. MRI. An imaging test of how blood flows through your blood vessels (angiogram). How is this treated? Treating an underlying medical condition can sometimes make tinnitus go away. If your tinnitus continues, other treatments may include: Therapy and counseling to help you manage the stress of living with tinnitus. Sound generators to mask the tinnitus. These include: Tabletop sound machines that play relaxing sounds to help you fall asleep. Wearable devices that fit in your ear and play sounds or music. Acoustic neural stimulation. This involves using headphones to listen to music that contains an auditory signal. Over time, listening to this signal may change some pathways in your brain and make you less sensitive to tinnitus. This treatment is used for very severe cases when no other treatment is working. Using hearing aids or cochlear implants if your tinnitus is related to hearing loss. Hearing aids are worn in the outer ear. Cochlear implants are surgically placed in the inner ear. Follow these instructions at home: Managing symptoms     When possible, avoid being in loud places and being exposed to loud sounds. Wear hearing protection, such as earplugs, when you are exposed to loud noises. Use a white noise machine, a humidifier, or other devices to mask the sound of tinnitus. Practice techniques  for reducing stress, such as meditation, yoga, or deep breathing. Work with your health care provider if you need help with managing stress. Sleep with your head  slightly raised. This may reduce the impact of tinnitus. General instructions Do not use stimulants, such as nicotine, alcohol, or caffeine. Talk with your health care provider about other stimulants to avoid. Stimulants are substances that can make you feel alert and attentive by increasing certain activities in the body (such as heart rate and blood pressure). These substances may make tinnitus worse. Take over-the-counter and prescription medicines only as told by your health care provider. Try to get plenty of sleep each night. Keep all follow-up visits. This is important. Contact a health care provider if: Your tinnitus continues for 3 weeks or longer without stopping. You develop sudden hearing loss. Your symptoms get worse or do not get better with home care. You feel you are not able to manage the stress of living with tinnitus. Get help right away if: You develop tinnitus after a head injury. You have tinnitus along with any of the following: Dizziness. Nausea and vomiting. Loss of balance. Sudden, severe headache. Vision changes. Facial weakness or weakness of arms or legs. These symptoms may represent a serious problem that is an emergency. Do not wait to see if the symptoms will go away. Get medical help right away. Call your local emergency services (911 in the [place] Do not drive yourself to the hospital. Summary Tinnitus refers to hearing a sound when there is no actual source for that sound. This is often described as ringing in the ears. Symptoms may affect only one ear (unilateral) or both ears (bilateral). Use a white noise machine, a humidifier, or other devices to mask the sound of tinnitus. Do not use stimulants, such as nicotine, alcohol, or caffeine. These substances may make tinnitus worse. This information is not intended to replace advice given to you by your health care provider. Make sure you discuss any questions you have with your healthcare  provider. Document Revised: 10/21/2020 Document Reviewed: 10/21/2020 Elsevier Patient Education  2022 [employer].

## 2021-05-21 NOTE — Progress Notes (Signed)
Date:  05/21/2021   Name:  Lindsey George   DOB:  08/26/1951   MRN:  [id number]   Chief Complaint: Ear Problem (Sounds like insects are in both ears x 3 days) and foot exam  Otalgia  There is pain in both (hearing noises) ears. This is a chronic problem. The current episode started in the past 7 days. The problem has been waxing and waning. There has been no fever. The patient is experiencing no pain. Pertinent negatives include no abdominal pain, coughing, diarrhea, ear discharge, headaches, hearing loss, neck pain, rash, rhinorrhea, sore throat or vomiting. She has tried nothing for the symptoms. The treatment provided mild relief.   Lab Results  Component Value Date   CREATININE 0.61 11/11/2020   BUN 13 11/11/2020   NA 138 11/11/2020   K 4.6 11/11/2020   CL 97 11/11/2020   CO2 26 11/11/2020   Lab Results  Component Value Date   CHOL 128 03/06/2020   HDL 48 03/06/2020   LDLCALC 58 03/06/2020   TRIG 127 03/06/2020   CHOLHDL 2.9 09/19/2018   No results found for: TSH Lab Results  Component Value Date   HGBA1C 6.2 (H) 03/18/2021   Lab Results  Component Value Date   WBC 12.1 (H) 08/11/2018   HGB 12.9 08/11/2018   HCT 40.8 08/11/2018   MCV 80.5 08/11/2018   PLT 394 08/11/2018   Lab Results  Component Value Date   ALT 14 03/06/2020   AST 17 03/06/2020   ALKPHOS 50 03/06/2020   BILITOT 0.5 03/06/2020     Review of Systems  Constitutional:  Negative for chills and fever.  HENT:  Positive for ear pain and tinnitus. Negative for congestion, drooling, ear discharge, hearing loss, mouth sores, nosebleeds, postnasal drip, rhinorrhea, sinus pressure and sore throat.   Respiratory:  Negative for cough, shortness of breath and wheezing.   Cardiovascular:  Negative for chest pain, palpitations and leg swelling.  Gastrointestinal:  Negative for abdominal pain, blood in stool, constipation, diarrhea, nausea and vomiting.  Endocrine: Negative for polydipsia.   Genitourinary:  Negative for dysuria, frequency, hematuria and urgency.  Musculoskeletal:  Negative for back pain, myalgias and neck pain.  Skin:  Negative for rash.  Allergic/Immunologic: Negative for environmental allergies.  Neurological:  Negative for dizziness and headaches.  Hematological:  Does not bruise/bleed easily.  Psychiatric/Behavioral:  Negative for suicidal ideas. The patient is not nervous/anxious.   All other systems reviewed and are negative.  Patient Active Problem List   Diagnosis Date Noted   Essential hypertension 03/06/2020   Cervical radiculopathy 03/06/2020   Asymptomatic varicose veins of both lower extremities 03/06/2020   Morbid obesity ([place]) 03/29/2019   Allergic rhinitis 01/19/2018   Hx of adenomatous colonic polyps 01/14/2018   Reactive airway disease without complication 29/52/8413   Back ache 05/22/2015   Alimentary obesity 05/22/2015   Type 2 diabetes mellitus without complication, without long-term current use of insulin ([place]) 04/12/2014   Hypercholesterolemia 04/12/2014   OP (osteoporosis) 04/12/2014    Allergies  Allergen Reactions   Codeine Other (See Comments)    Past Surgical History:  Procedure Laterality Date   ABDOMINAL HYSTERECTOMY     BREAST BIOPSY Right 06/22/11   bx/clip-neg   BREAST CYST ASPIRATION Left    neg   COLONOSCOPY  2014   cleared for 5 yrs- [place] doc   COLONOSCOPY WITH PROPOFOL N/A 03/25/2018   Procedure: COLONOSCOPY WITH PROPOFOL;  Surgeon: Manya Silvas, MD;  Location:  [facility] ENDOSCOPY;  Service: Endoscopy;  Laterality: N/A;   PARTIAL HYSTERECTOMY     SPLENECTOMY      Social History   Tobacco Use   Smoking status: Never   Smokeless tobacco: Never   Tobacco comments:    smoking cessation materials not required  Vaping Use   Vaping Use: Never used  Substance Use Topics   Alcohol use: No    Alcohol/week: 0.0 standard drinks   Drug use: No     Medication list has been reviewed and updated.  Current  Meds  Medication Sig   albuterol (PROVENTIL HFA;VENTOLIN HFA) 108 (90 Base) MCG/ACT inhaler Inhale 2 puffs into the lungs every 6 (six) hours as needed for wheezing or shortness of breath.   albuterol (PROVENTIL) (2.5 MG/3ML) 0.083% nebulizer solution Take 3 mLs (2.5 mg total) by nebulization every 6 (six) hours as needed for wheezing or shortness of breath.   alendronate (FOSAMAX) 70 MG tablet TAKE 1 TABLET BY MOUTH ONCE A WEEK. TAKE WITH A FULL GLASS OF WATER ON AN EMPTY STOMACH.   aspirin 81 MG tablet Take 81 mg by mouth daily.   calcium-vitamin D (OSCAL WITH D) 500-200 MG-UNIT tablet Take 1 tablet by mouth.   Cholecalciferol (VITAMIN D3) 10 MCG (400 UNIT) CAPS    cyclobenzaprine (FLEXERIL) 10 MG tablet Take by mouth. mundy   EPIPEN 2-PAK 0.3 MG/0.3ML SOAJ injection    gabapentin (NEURONTIN) 100 MG capsule Take 200 mg by mouth daily. mundy   glipiZIDE (GLIPIZIDE XL) 5 MG 24 hr tablet Take 1 tablet (5 mg total) by mouth daily.   loratadine (CLARITIN) 10 MG tablet Take 10 mg by mouth daily. otc   losartan (COZAAR) 25 MG tablet Take 1 tablet (25 mg total) by mouth daily.   metFORMIN (GLUCOPHAGE) 500 MG tablet TAKE ONE TABLET BY MOUTH TWICE DAILY   Multiple Vitamin (MULTIVITAMIN) capsule Take 1 capsule by mouth daily.   ONETOUCH ULTRA test strip USE 1 STRIP TO CHECK GLUCOSE ONCE DAILY   pioglitazone (ACTOS) 15 MG tablet Take 1 tablet (15 mg total) by mouth daily.   simvastatin (ZOCOR) 40 MG tablet Take 1 tablet by mouth once daily   Current Facility-Administered Medications for the 05/21/21 encounter (Office Visit) with Juline Patch, MD  Medication   albuterol (PROVENTIL) (2.5 MG/3ML) 0.083% nebulizer solution 2.5 mg   ipratropium-albuterol (DUONEB) 0.5-2.5 (3) MG/3ML nebulizer solution 3 mL    PHQ 2/9 Scores 11/11/2020 07/11/2020 06/10/2020 04/22/2020  PHQ - 2 Score 0 0 0 0  PHQ- 9 Score 0 1 - 0    GAD 7 : Generalized Anxiety Score 11/11/2020 07/11/2020 04/22/2020 03/06/2020  Nervous,  Anxious, on Edge 0 0 0 0  Control/stop worrying 0 0 0 0  Worry too much - different things 0 0 0 0  Trouble relaxing 0 0 0 0  Restless 0 0 0 0  Easily annoyed or irritable 0 0 0 0  Afraid - awful might happen 0 0 0 0  Total GAD 7 Score 0 0 0 0    BP Readings from Last 3 Encounters:  05/21/21 128/76  03/18/21 130/80  11/11/20 112/70    Physical Exam Vitals and nursing note reviewed.  Constitutional:      General: She is not in acute distress.    Appearance: She is not diaphoretic.  HENT:     Head: Normocephalic and atraumatic.     Right Ear: Tympanic membrane and external ear normal. No decreased hearing noted. There is  impacted cerumen.     Left Ear: Tympanic membrane and external ear normal. No decreased hearing noted. There is impacted cerumen.     Ears:     Comments: Circumferential skin is at attached to the auditory canal but tympanic membrane is visualized.    Nose: Nose normal.  Eyes:     General:        Right eye: No discharge.        Left eye: No discharge.     Conjunctiva/sclera: Conjunctivae normal.     Pupils: Pupils are equal, round, and reactive to light.  Neck:     Thyroid: No thyromegaly.     Vascular: No JVD.  Cardiovascular:     Rate and Rhythm: Normal rate and regular rhythm.     Heart sounds: Normal heart sounds. No murmur heard.   No friction rub. No gallop.  Pulmonary:     Effort: Pulmonary effort is normal.     Breath sounds: Normal breath sounds.  Abdominal:     General: Bowel sounds are normal.     Palpations: Abdomen is soft. There is no mass.     Tenderness: There is no abdominal tenderness. There is no guarding.  Musculoskeletal:        General: Normal range of motion.     Cervical back: Normal range of motion and neck supple.  Lymphadenopathy:     Cervical: No cervical adenopathy.  Skin:    General: Skin is warm and dry.  Neurological:     Mental Status: She is alert.     Deep Tendon Reflexes: Reflexes are normal and symmetric.     Wt Readings from Last 3 Encounters:  05/21/21 185 lb (83.9 kg)  03/18/21 181 lb (82.1 kg)  11/11/20 180 lb (81.6 kg)    BP 128/76   Pulse 88   Ht 5\' 3"  (1.6 m)   Wt 185 lb (83.9 kg)   BMI 32.77 kg/m   Assessment and Plan:  1. Bilateral impacted cerumen New onset.  Episodic.  Patient has been using Q-tips but it seems like that there was a lot of paperlike material that we were pulling out of both ears and curetting out as well.  Patient has been Debrox and also have suggested that she use bulb syringe to irrigate her ears when she takes a bath or shower.  Patient's ears were irrigated and cerumen was removed and paper was removed via curettage.  2. Tinnitus of both ears New onset.  Occurs when she is in her bedroom when there is no ambient noise for competitiveness.  We have discussed and the etiology of tinnitus and how it is related to chronic hearing loss.  Patient is aware that this may be early stages and we will watch this over the coming months.  In the meantime information has been given and we will observe.

## 2021-05-22 ENCOUNTER — Ambulatory Visit
Admission: RE | Admit: 2021-05-22 | Discharge: 2021-05-22 | Disposition: A | Discharge status: Home / Self Care | Payer: Medicare PPO | Source: Ambulatory Visit | Attending: Physician Assistant | Admitting: Physician Assistant

## 2021-05-22 DIAGNOSIS — G319 Degenerative disease of nervous system, unspecified: Secondary | ICD-10-CM | POA: Diagnosis not present

## 2021-05-22 DIAGNOSIS — I639 Cerebral infarction, unspecified: Secondary | ICD-10-CM | POA: Insufficient documentation

## 2021-06-06 ENCOUNTER — Other Ambulatory Visit: Payer: Self-pay | Admitting: Family Medicine

## 2021-06-06 ENCOUNTER — Telehealth: Payer: Self-pay | Admitting: Family Medicine

## 2021-06-06 DIAGNOSIS — I10 Essential (primary) hypertension: Secondary | ICD-10-CM

## 2021-06-06 NOTE — Telephone Encounter (Signed)
Pt is calling to ask is a co-pay due for her medicare wellness visits since she is seeing the nurse not the dr? Please advise [phone]

## 2021-06-09 NOTE — Telephone Encounter (Signed)
Contacted patient about message

## 2021-06-11 ENCOUNTER — Ambulatory Visit (INDEPENDENT_AMBULATORY_CARE_PROVIDER_SITE_OTHER): Payer: Medicare PPO

## 2021-06-11 DIAGNOSIS — Z780 Asymptomatic menopausal state: Secondary | ICD-10-CM

## 2021-06-11 DIAGNOSIS — Z0000 Encounter for general adult medical examination without abnormal findings: Secondary | ICD-10-CM | POA: Diagnosis not present

## 2021-06-11 NOTE — Progress Notes (Signed)
Subjective:   Lindsey George is a 70 y.o. female who presents for Medicare Annual (Subsequent) preventive examination.  Virtual Visit via Telephone Note  I connected with  Lindsey George on 06/11/21 at  9:20 AM EDT by telephone and verified that I am speaking with the correct person using two identifiers.  Location: Patient: home Provider: [facility] Persons participating in the virtual visit: [place]   I discussed the limitations, risks, security and privacy concerns of performing an evaluation and management service by telephone and the availability of in person appointments. The patient expressed understanding and agreed to proceed.  Interactive audio and video telecommunications were attempted between this nurse and patient, however failed, due to patient having technical difficulties OR patient did not have access to video capability.  We continued and completed visit with audio only.  Some vital signs may be absent or patient reported.   Clemetine Marker, LPN   Review of Systems     Cardiac Risk Factors include: advanced age (>64men, >65 women);diabetes mellitus;dyslipidemia;hypertension;obesity (BMI >30kg/m2)     Objective:    Today's Vitals   06/11/21 0923  PainSc: 3    There is no height or weight on file to calculate BMI.  Advanced Directives 06/11/2021 06/10/2020 06/05/2019 06/01/2018 03/25/2018 08/30/2015  Does Patient Have a Medical Advance Directive? No No No No No No  Would patient like information on creating a medical advance directive? Yes (MAU/Ambulatory/Procedural Areas - Information given) No - Patient declined Yes (MAU/Ambulatory/Procedural Areas - Information given) Yes (MAU/Ambulatory/Procedural Areas - Information given) - No - patient declined information    Current Medications (verified) Outpatient Encounter Medications as of 06/11/2021  Medication Sig   albuterol (PROVENTIL HFA;VENTOLIN HFA) 108 (90 Base) MCG/ACT inhaler Inhale 2  puffs into the lungs every 6 (six) hours as needed for wheezing or shortness of breath.   albuterol (PROVENTIL) (2.5 MG/3ML) 0.083% nebulizer solution Take 3 mLs (2.5 mg total) by nebulization every 6 (six) hours as needed for wheezing or shortness of breath.   alendronate (FOSAMAX) 70 MG tablet TAKE 1 TABLET BY MOUTH ONCE A WEEK. TAKE WITH A FULL GLASS OF WATER ON AN EMPTY STOMACH.   aspirin 81 MG tablet Take 81 mg by mouth daily.   calcium-vitamin D (OSCAL WITH D) 500-200 MG-UNIT tablet Take 1 tablet by mouth.   Cholecalciferol (VITAMIN D3) 10 MCG (400 UNIT) CAPS    cyclobenzaprine (FLEXERIL) 10 MG tablet Take by mouth. mundy   EPIPEN 2-PAK 0.3 MG/0.3ML SOAJ injection    gabapentin (NEURONTIN) 300 MG capsule Take 1 capsule by mouth 2 (two) times daily.   glipiZIDE (GLIPIZIDE XL) 5 MG 24 hr tablet Take 1 tablet (5 mg total) by mouth daily.   loratadine (CLARITIN) 10 MG tablet Take 10 mg by mouth daily. otc   losartan (COZAAR) 25 MG tablet Take 1 tablet by mouth once daily   meloxicam (MOBIC) 15 MG tablet Take 1 tablet by mouth daily as needed.   metFORMIN (GLUCOPHAGE) 500 MG tablet TAKE ONE TABLET BY MOUTH TWICE DAILY   Multiple Vitamin (MULTIVITAMIN) capsule Take 1 capsule by mouth daily.   ONETOUCH ULTRA test strip USE 1 STRIP TO CHECK GLUCOSE ONCE DAILY   pioglitazone (ACTOS) 15 MG tablet Take 1 tablet (15 mg total) by mouth daily.   simvastatin (ZOCOR) 40 MG tablet Take 1 tablet by mouth once daily   carbamide peroxide (DEBROX) 6.5 % OTIC solution Place 5 drops into both ears 2 (two) times daily. Prior  to shower (Patient not taking: Reported on 06/11/2021)   [DISCONTINUED] gabapentin (NEURONTIN) 100 MG capsule Take 200 mg by mouth daily. mundy   Facility-Administered Encounter Medications as of 06/11/2021  Medication   albuterol (PROVENTIL) (2.5 MG/3ML) 0.083% nebulizer solution 2.5 mg   ipratropium-albuterol (DUONEB) 0.5-2.5 (3) MG/3ML nebulizer solution 3 mL    Allergies  (verified) Codeine   History: Past Medical History:  Diagnosis Date   Asthma    Cancer ([place])    skin ca   Chronic back pain    Diabetes mellitus without complication ([place])    Hyperlipidemia    Hypertension    Obesity    Osteoporosis    Past Surgical History:  Procedure Laterality Date   ABDOMINAL HYSTERECTOMY     BREAST BIOPSY Right 06/22/11   bx/clip-neg   BREAST CYST ASPIRATION Left    neg   COLONOSCOPY  2014   cleared for 5 yrs- [facility] doc   COLONOSCOPY WITH PROPOFOL N/A 03/25/2018   Procedure: COLONOSCOPY WITH PROPOFOL;  Surgeon: Manya Silvas, MD;  Location: [facility] ENDOSCOPY;  Service: Endoscopy;  Laterality: N/A;   PARTIAL HYSTERECTOMY     SPLENECTOMY     Family History  Problem Relation Age of Onset   Breast cancer Sister 12   Cancer Sister    Heart disease Sister    Cancer Father    Stroke Father    Heart disease Sister    Social History   Socioeconomic History   Marital status: Married    Spouse name: Not on file   Number of children: 2   Years of education: Not on file   Highest education level: Associate degree: academic program  Occupational History   Occupation: Retired  Tobacco Use   Smoking status: Never   Smokeless tobacco: Never   Tobacco comments:    smoking cessation materials not required  Vaping Use   Vaping Use: Never used  Substance and Sexual Activity   Alcohol use: No    Alcohol/week: 0.0 standard drinks   Drug use: No   Sexual activity: Yes  Other Topics Concern   Not on file  Social History Narrative   Not on file   Social Determinants of Health   Financial Resource Strain: Low Risk    Difficulty of Paying Living Expenses: Not hard at all  Food Insecurity: No Food Insecurity   Worried About Charity fundraiser in the Last Year: Never true   [place] in the Last Year: Never true  Transportation Needs: No Transportation Needs   Lack of Transportation (Medical): No   Lack of Transportation (Non-Medical): No   Physical Activity: Inactive   Days of Exercise per Week: 0 days   Minutes of Exercise per Session: 0 min  Stress: No Stress Concern Present   Feeling of Stress : Not at all  Social Connections: Moderately Integrated   Frequency of Communication with Friends and Family: More than three times a week   Frequency of Social Gatherings with Friends and Family: Three times a week   Attends Religious Services: More than 4 times per year   Active Member of Clubs or Organizations: No   Attends Archivist Meetings: Never   Marital Status: Married    Tobacco Counseling Counseling given: Not Answered Tobacco comments: smoking cessation materials not required   Clinical Intake:  Pre-visit preparation completed: Yes  Pain : 0-10 Pain Score: 3  Pain Type: Chronic pain Pain Location: Leg Pain Orientation: Left Pain  Descriptors / Indicators: Aching, Sore Pain Onset: More than a month ago Pain Frequency: Intermittent     Nutritional Risks: None Diabetes: Yes CBG done?: No Did pt. bring in CBG monitor from home?: No  How often do you need to have someone help you when you read instructions, pamphlets, or other written materials from your doctor or pharmacy?: 1 - Never  Nutrition Risk Assessment:  Has the patient had any N/V/D within the last 2 months?  No  Does the patient have any non-healing wounds?  No  Has the patient had any unintentional weight loss or weight gain?  No   Diabetes:  Is the patient diabetic?  Yes  If diabetic, was a CBG obtained today?  No Did the patient bring in their glucometer from home?  No  How often do you monitor your CBG's? Twice weekly per patient.   Financial Strains and Diabetes Management:  Are you having any financial strains with the device, your supplies or your medication? No .  Does the patient want to be seen by Chronic Care Management for management of their diabetes?  No  Would the patient like to be referred to a  Nutritionist or for Diabetic Management?  No   Diabetic Exams:  Diabetic Eye Exam: Completed 11/19/20 negative retinopathy.   Diabetic Foot Exam: Completed 05/21/21.   Interpreter Needed?: No  Information entered by :: Clemetine Marker LPN   Activities of Daily Living In your present state of health, do you have any difficulty performing the following activities: 06/11/2021  Hearing? Y  Comment declines hearing aids  Vision? N  Difficulty concentrating or making decisions? N  Walking or climbing stairs? Y  Dressing or bathing? N  Doing errands, shopping? N  Preparing Food and eating ? N  Using the Toilet? N  In the past six months, have you accidently leaked urine? N  Do you have problems with loss of bowel control? N  Managing your Medications? N  Managing your Finances? N  Housekeeping or managing your Housekeeping? N  Some recent data might be hidden    Patient Care Team: Juline Patch, MD as PCP - General (Family Medicine) Erven Colla (Physician Assistant) Reche Dixon, PA-C (Orthopedic Surgery)  Indicate any recent Medical Services you may have received from other than [facility] providers in the past year (date may be approximate).     Assessment:   This is a routine wellness examination for [place].  Hearing/Vision screen Hearing Screening - Comments:: Pt denies hearing difficulty other than wax build up and tinnitus Vision Screening - Comments:: Annual vision screenings done at [facility]  Dietary issues and exercise activities discussed: Current Exercise Habits: The patient does not participate in regular exercise at present, Exercise limited by: orthopedic condition(s)   Goals Addressed             This Visit's Progress    Increase physical activity   Not on track    Pt would like to get back to walking more and exercising overall, unable to go to the gym right now due to covid-19        Depression Screen PHQ 2/9 Scores 06/11/2021  11/11/2020 07/11/2020 06/10/2020 04/22/2020 03/06/2020 01/10/2020  PHQ - 2 Score 1 0 0 0 0 0 0  PHQ- 9 Score 2 0 1 - 0 0 0    Fall Risk Fall Risk  06/11/2021 11/11/2020 07/11/2020 06/10/2020 03/06/2020  Falls in the past year? 0 0 0 0 0  Comment - - - - -  Number falls in past yr: 0 - - 0 -  Injury with Fall? 0 - - 0 -  Risk for fall due to : No Fall Risks - - No Fall Risks -  Risk for fall due to: Comment - - - - -  Follow up Falls prevention discussed Falls evaluation completed Falls evaluation completed Falls prevention discussed Falls evaluation completed    [place]:  Any stairs in or around the home? Yes  If so, are there any without handrails? Yes  - just a few steps outside Home free of loose throw rugs in walkways, pet beds, electrical cords, etc? Yes  Adequate lighting in your home to reduce risk of falls? Yes   ASSISTIVE DEVICES UTILIZED TO PREVENT FALLS:  Life alert? No  Use of a cane, walker or w/c? No  Grab bars in the bathroom? No  Shower chair or bench in shower? No  Elevated toilet seat or a handicapped toilet? No   TIMED UP AND GO:  Was the test performed? No . Telephonic visit.   Cognitive Function: Normal cognitive status assessed by direct observation by this Nurse Health Advisor. No abnormalities found.       6CIT Screen 06/10/2020 06/05/2019 06/01/2018  What Year? 0 points 0 points 0 points  What month? 0 points 0 points 0 points  What time? 0 points 0 points 0 points  Count back from 20 0 points 0 points 0 points  Months in reverse 0 points 0 points 0 points  Repeat phrase 0 points 0 points 2 points  Total Score 0 0 2    Immunizations Immunization History  Administered Date(s) Administered   Fluad Quad(high Dose 65+) 09/01/2019, 11/11/2020   Influenza, High Dose Seasonal PF 08/05/2017, 09/19/2018   Influenza,inj,Quad PF,6+ Mos 08/19/2016   PFIZER(Purple Top)SARS-COV-2 Vaccination 01/05/2020, 01/26/2020, 10/03/2020,  03/18/2021   Pneumococcal Conjugate-13 09/03/2015   Pneumococcal Polysaccharide-23 09/30/2009, 08/05/2017   Td 07/31/2004   Tdap 08/19/2016   Zoster, Live 07/20/2016    TDAP status: Up to date  Flu Vaccine status: Up to date  Pneumococcal vaccine status: Up to date  Covid-19 vaccine status: Completed vaccines  Qualifies for Shingles Vaccine? Yes   Zostavax completed Yes   Shingrix Completed?: No.    Education has been provided regarding the importance of this vaccine. Patient has been advised to call insurance company to determine out of pocket expense if they have not yet received this vaccine. Advised may also receive vaccine at local pharmacy or Health Dept. Verbalized acceptance and understanding.  Screening Tests Health Maintenance  Topic Date Due   Zoster Vaccines- Shingrix (1 of 2) 08/21/2021 (Originally 02/22/2000)   MAMMOGRAM  06/19/2021   INFLUENZA VACCINE  06/30/2021   HEMOGLOBIN A1C  09/17/2021   OPHTHALMOLOGY EXAM  11/19/2021   FOOT EXAM  05/21/2022   COLONOSCOPY (Pts 45-73yrs Insurance coverage will need to be confirmed)  03/26/2023   TETANUS/TDAP  08/19/2026   DEXA SCAN  Completed   COVID-19 Vaccine  Completed   Hepatitis C Screening  Completed   PNA vac Low Risk Adult  Completed   HPV VACCINES  Aged Out    Health Maintenance  There are no preventive care reminders to display for this patient.  Colorectal cancer screening: Type of screening: Colonoscopy. Completed 03/25/18. Repeat every 5 years  Mammogram status: Completed 06/19/20. Repeat every year. Scheduled for 06/23/21  Bone Density status: Completed 06/05/16. Results reflect:  Bone density results: OSTEOPENIA. Repeat every 2 years. Ordered today  Lung Cancer Screening: (Low Dose CT Chest recommended if Age 51-80 years, 30 pack-year currently smoking OR have quit w/in 15years.) does not qualify.  Additional Screening:  Hepatitis C Screening: does qualify; Completed 08/05/17  Vision Screening:  Recommended annual ophthalmology exams for early detection of glaucoma and other disorders of the eye. Is the patient up to date with their annual eye exam?  Yes  Who is the provider or what is the name of the office in which the patient attends annual eye exams? [facility].   Dental Screening: Recommended annual dental exams for proper oral hygiene  Community Resource Referral / Chronic Care Management: CRR required this visit?  No   CCM required this visit?  No      Plan:     I have personally reviewed and noted the following in the patient's chart:   Medical and social history Use of alcohol, tobacco or illicit drugs  Current medications and supplements including opioid prescriptions.  Functional ability and status Nutritional status Physical activity Advanced directives List of other physicians Hospitalizations, surgeries, and ER visits in previous 12 months Vitals Screenings to include cognitive, depression, and falls Referrals and appointments  In addition, I have reviewed and discussed with patient certain preventive protocols, quality metrics, and best practice recommendations. A written personalized care plan for preventive services as well as general preventive health recommendations were provided to patient.     Clemetine Marker, LPN   [id number]   Nurse Notes: pt states she did not pick up debrox ear drops but plans to get them to help with wax build up.

## 2021-06-11 NOTE — Patient Instructions (Signed)
Lindsey George , Thank you for taking time to come for your Medicare Wellness Visit. I appreciate your ongoing commitment to your health goals. Please review the following plan we discussed and let me know if I can assist you in the future.   Screening recommendations/referrals: Colonoscopy: done 03/25/18. Repeat in 03/2023 Mammogram: done 06/19/20; scheduled for 06/23/21 Bone Density: done 06/05/16. Please call [phone] to schedule your bone density screening.  Recommended yearly ophthalmology/optometry visit for glaucoma screening and checkup Recommended yearly dental visit for hygiene and checkup  Vaccinations: Influenza vaccine: done 11/11/20 Pneumococcal vaccine: done 08/05/17 Tdap vaccine: done 08/19/16 Shingles vaccine: Shingrix discussed. Please contact your pharmacy for coverage information.    Covid-19: done 01/05/20, 01/26/20, 10/03/20 & 03/18/21  Advanced directives: Advance directive discussed with you today. I have provided a copy for you to complete at home and have notarized. Once this is complete please bring a copy in to our office so we can scan it into your chart.   Conditions/risks identified: Recommend increasing physical activity as tolerated.   Next appointment: Follow up in one year for your annual wellness visit.   Preventive Care 42 Years and Older, Female Preventive care refers to lifestyle choices and visits with your health care provider that can promote health and wellness. What does preventive care include? A yearly physical exam. This is also called an annual well check. Dental exams once or twice a year. Routine eye exams. Ask your health care provider how often you should have your eyes checked. Personal lifestyle choices, including: Daily care of your teeth and gums. Regular physical activity. Eating a healthy diet. Avoiding tobacco and drug use. Limiting alcohol use. Practicing safe sex. Taking low-dose aspirin every day. Taking vitamin and mineral  supplements as recommended by your health care provider. What happens during an annual well check? The services and screenings done by your health care provider during your annual well check will depend on your age, overall health, lifestyle risk factors, and family history of disease. Counseling  Your health care provider may ask you questions about your: Alcohol use. Tobacco use. Drug use. Emotional well-being. Home and relationship well-being. Sexual activity. Eating habits. History of falls. Memory and ability to understand (cognition). Work and work Statistician. Reproductive health. Screening  You may have the following tests or measurements: Height, weight, and BMI. Blood pressure. Lipid and cholesterol levels. These may be checked every 5 years, or more frequently if you are over 64 years old. Skin check. Lung cancer screening. You may have this screening every year starting at age 5 if you have a 30-pack-year history of smoking and currently smoke or have quit within the past 15 years. Fecal occult blood test (FOBT) of the stool. You may have this test every year starting at age 76. Flexible sigmoidoscopy or colonoscopy. You may have a sigmoidoscopy every 5 years or a colonoscopy every 10 years starting at age 57. Hepatitis C blood test. Hepatitis B blood test. Sexually transmitted disease (STD) testing. Diabetes screening. This is done by checking your blood sugar (glucose) after you have not eaten for a while (fasting). You may have this done every 1-3 years. Bone density scan. This is done to screen for osteoporosis. You may have this done starting at age 45. Mammogram. This may be done every 1-2 years. Talk to your health care provider about how often you should have regular mammograms. Talk with your health care provider about your test results, treatment options, and if necessary, the need for  more tests. Vaccines  Your health care provider may recommend certain  vaccines, such as: Influenza vaccine. This is recommended every year. Tetanus, diphtheria, and acellular pertussis (Tdap, Td) vaccine. You may need a Td booster every 10 years. Zoster vaccine. You may need this after age 36. Pneumococcal 13-valent conjugate (PCV13) vaccine. One dose is recommended after age 50. Pneumococcal polysaccharide (PPSV23) vaccine. One dose is recommended after age 53. Talk to your health care provider about which screenings and vaccines you need and how often you need them. This information is not intended to replace advice given to you by your health care provider. Make sure you discuss any questions you have with your health care provider. Document Released: 12/13/2015 Document Revised: 08/05/2016 Document Reviewed: 09/17/2015 Elsevier Interactive Patient Education  2017 [place] Prevention in the Home Falls can cause injuries. They can happen to people of all ages. There are many things you can do to make your home safe and to help prevent falls. What can I do on the outside of my home? Regularly fix the edges of walkways and driveways and fix any cracks. Remove anything that might make you trip as you walk through a door, such as a raised step or threshold. Trim any bushes or trees on the path to your home. Use bright outdoor lighting. Clear any walking paths of anything that might make someone trip, such as rocks or tools. Regularly check to see if handrails are loose or broken. Make sure that both sides of any steps have handrails. Any raised decks and porches should have guardrails on the edges. Have any leaves, snow, or ice cleared regularly. Use sand or salt on walking paths during winter. Clean up any spills in your garage right away. This includes oil or grease spills. What can I do in the bathroom? Use night lights. Install grab bars by the toilet and in the tub and shower. Do not use towel bars as grab bars. Use non-skid mats or decals in  the tub or shower. If you need to sit down in the shower, use a plastic, non-slip stool. Keep the floor dry. Clean up any water that spills on the floor as soon as it happens. Remove soap buildup in the tub or shower regularly. Attach bath mats securely with double-sided non-slip rug tape. Do not have throw rugs and other things on the floor that can make you trip. What can I do in the bedroom? Use night lights. Make sure that you have a light by your bed that is easy to reach. Do not use any sheets or blankets that are too big for your bed. They should not hang down onto the floor. Have a firm chair that has side arms. You can use this for support while you get dressed. Do not have throw rugs and other things on the floor that can make you trip. What can I do in the kitchen? Clean up any spills right away. Avoid walking on wet floors. Keep items that you use a lot in easy-to-reach places. If you need to reach something above you, use a strong step stool that has a grab bar. Keep electrical cords out of the way. Do not use floor polish or wax that makes floors slippery. If you must use wax, use non-skid floor wax. Do not have throw rugs and other things on the floor that can make you trip. What can I do with my stairs? Do not leave any items on the stairs. Make  sure that there are handrails on both sides of the stairs and use them. Fix handrails that are broken or loose. Make sure that handrails are as long as the stairways. Check any carpeting to make sure that it is firmly attached to the stairs. Fix any carpet that is loose or worn. Avoid having throw rugs at the top or bottom of the stairs. If you do have throw rugs, attach them to the floor with carpet tape. Make sure that you have a light switch at the top of the stairs and the bottom of the stairs. If you do not have them, ask someone to add them for you. What else can I do to help prevent falls? Wear shoes that: Do not have high  heels. Have rubber bottoms. Are comfortable and fit you well. Are closed at the toe. Do not wear sandals. If you use a stepladder: Make sure that it is fully opened. Do not climb a closed stepladder. Make sure that both sides of the stepladder are locked into place. Ask someone to hold it for you, if possible. Clearly mark and make sure that you can see: Any grab bars or handrails. First and last steps. Where the edge of each step is. Use tools that help you move around (mobility aids) if they are needed. These include: Canes. Walkers. Scooters. Crutches. Turn on the lights when you go into a dark area. Replace any light bulbs as soon as they burn out. Set up your furniture so you have a clear path. Avoid moving your furniture around. If any of your floors are uneven, fix them. If there are any pets around you, be aware of where they are. Review your medicines with your doctor. Some medicines can make you feel dizzy. This can increase your chance of falling. Ask your doctor what other things that you can do to help prevent falls. This information is not intended to replace advice given to you by your health care provider. Make sure you discuss any questions you have with your health care provider. Document Released: 09/12/2009 Document Revised: 04/23/2016 Document Reviewed: 12/21/2014 Elsevier Interactive Patient Education  2017 [employer].

## 2021-06-23 ENCOUNTER — Other Ambulatory Visit: Payer: Self-pay

## 2021-06-23 ENCOUNTER — Ambulatory Visit
Admission: RE | Admit: 2021-06-23 | Discharge: 2021-06-23 | Disposition: A | Discharge status: Home / Self Care | Payer: Medicare PPO | Source: Ambulatory Visit | Attending: Family Medicine | Admitting: Family Medicine

## 2021-06-23 DIAGNOSIS — Z1231 Encounter for screening mammogram for malignant neoplasm of breast: Secondary | ICD-10-CM | POA: Diagnosis not present

## 2021-06-24 ENCOUNTER — Other Ambulatory Visit: Payer: Self-pay | Admitting: Family Medicine

## 2021-06-24 DIAGNOSIS — R928 Other abnormal and inconclusive findings on diagnostic imaging of breast: Secondary | ICD-10-CM

## 2021-06-26 DIAGNOSIS — R413 Other amnesia: Secondary | ICD-10-CM | POA: Diagnosis not present

## 2021-06-26 DIAGNOSIS — R251 Tremor, unspecified: Secondary | ICD-10-CM | POA: Insufficient documentation

## 2021-06-26 DIAGNOSIS — M79642 Pain in left hand: Secondary | ICD-10-CM | POA: Insufficient documentation

## 2021-06-27 ENCOUNTER — Ambulatory Visit
Admission: RE | Admit: 2021-06-27 | Discharge: 2021-06-27 | Disposition: A | Discharge status: Home / Self Care | Payer: Medicare PPO | Source: Ambulatory Visit | Attending: Family Medicine | Admitting: Family Medicine

## 2021-06-27 ENCOUNTER — Other Ambulatory Visit: Payer: Self-pay

## 2021-06-27 DIAGNOSIS — R928 Other abnormal and inconclusive findings on diagnostic imaging of breast: Secondary | ICD-10-CM

## 2021-06-27 DIAGNOSIS — R922 Inconclusive mammogram: Secondary | ICD-10-CM | POA: Diagnosis not present

## 2021-07-14 ENCOUNTER — Other Ambulatory Visit: Payer: Self-pay | Admitting: Family Medicine

## 2021-07-14 DIAGNOSIS — M810 Age-related osteoporosis without current pathological fracture: Secondary | ICD-10-CM

## 2021-07-18 ENCOUNTER — Ambulatory Visit: Payer: Medicare PPO | Admitting: Family Medicine

## 2021-07-21 ENCOUNTER — Other Ambulatory Visit: Payer: Self-pay

## 2021-07-21 ENCOUNTER — Ambulatory Visit: Payer: Medicare PPO | Admitting: Family Medicine

## 2021-07-21 ENCOUNTER — Encounter: Payer: Self-pay | Admitting: Family Medicine

## 2021-07-21 VITALS — BP 122/80 | HR 64 | Ht 63.0 in | Wt 182.0 lb

## 2021-07-21 DIAGNOSIS — E7800 Pure hypercholesterolemia, unspecified: Secondary | ICD-10-CM

## 2021-07-21 DIAGNOSIS — I10 Essential (primary) hypertension: Secondary | ICD-10-CM

## 2021-07-21 DIAGNOSIS — E119 Type 2 diabetes mellitus without complications: Secondary | ICD-10-CM

## 2021-07-21 MED ORDER — SIMVASTATIN 40 MG PO TABS: ORAL_TABLET | ORAL | 1 refills | Status: DC

## 2021-07-21 MED ORDER — GLIPIZIDE ER 5 MG PO TB24: 5.0000 mg | ORAL_TABLET | Freq: Every day | ORAL | 1 refills | Status: DC

## 2021-07-21 MED ORDER — PIOGLITAZONE HCL 15 MG PO TABS: 15.0000 mg | ORAL_TABLET | Freq: Every day | ORAL | 1 refills | Status: DC

## 2021-07-21 MED ORDER — METFORMIN HCL 500 MG PO TABS: ORAL_TABLET | ORAL | 1 refills | Status: DC

## 2021-07-21 MED ORDER — LOSARTAN POTASSIUM 25 MG PO TABS: 25.0000 mg | ORAL_TABLET | Freq: Every day | ORAL | 1 refills | Status: DC

## 2021-07-21 NOTE — Progress Notes (Signed)
Date:  07/21/2021   Name:  Lindsey George   DOB:  Mar 17, 1951   MRN:  [id number]   Chief Complaint: Hyperlipidemia, Hypertension, and Diabetes  Hyperlipidemia This is a chronic problem. The current episode started more than 1 year ago. The problem is controlled. Recent lipid tests were reviewed and are normal. Exacerbating diseases include diabetes. She has no history of chronic renal disease, hypothyroidism, liver disease, obesity or nephrotic syndrome. Pertinent negatives include no chest pain, leg pain, myalgias or shortness of breath. Current antihyperlipidemic treatment includes statins. The current treatment provides mild improvement of lipids. There are no compliance problems.  Risk factors for coronary artery disease include hypertension.  Hypertension This is a chronic problem. The current episode started more than 1 year ago. The problem has been gradually improving since onset. Pertinent negatives include no blurred vision, chest pain, headaches, neck pain, palpitations, peripheral edema or shortness of breath. Risk factors for coronary artery disease include diabetes mellitus and dyslipidemia. Past treatments include angiotensin blockers. The current treatment provides moderate improvement. There are no compliance problems.  There is no history of angina, kidney disease, CAD/MI, CVA, heart failure, left ventricular hypertrophy, PVD or retinopathy. There is no history of chronic renal disease.  Diabetes She presents for her follow-up diabetic visit. She has type 2 diabetes mellitus. No MedicAlert identification noted. Pertinent negatives for hypoglycemia include no confusion, dizziness, headaches, mood changes, nervousness/anxiousness, seizures, sleepiness or speech difficulty. Pertinent negatives for diabetes include no blurred vision, no chest pain, no fatigue, no foot paresthesias, no foot ulcerations, no polydipsia, no polyphagia, no polyuria, no visual change, no weakness and no  weight loss. Symptoms are stable. Pertinent negatives for diabetic complications include no CVA, PVD or retinopathy. Her breakfast blood glucose is taken between 6-7 am. Her breakfast blood glucose range is generally 130-140 mg/dl.   Lab Results  Component Value Date   CREATININE 0.61 11/11/2020   BUN 13 11/11/2020   NA 138 11/11/2020   K 4.6 11/11/2020   CL 97 11/11/2020   CO2 26 11/11/2020   Lab Results  Component Value Date   CHOL 128 03/06/2020   HDL 48 03/06/2020   LDLCALC 58 03/06/2020   TRIG 127 03/06/2020   CHOLHDL 2.9 09/19/2018   No results found for: TSH Lab Results  Component Value Date   HGBA1C 6.2 (H) 03/18/2021   Lab Results  Component Value Date   WBC 12.1 (H) 08/11/2018   HGB 12.9 08/11/2018   HCT 40.8 08/11/2018   MCV 80.5 08/11/2018   PLT 394 08/11/2018   Lab Results  Component Value Date   ALT 14 03/06/2020   AST 17 03/06/2020   ALKPHOS 50 03/06/2020   BILITOT 0.5 03/06/2020     Review of Systems  Constitutional:  Negative for chills, fatigue, fever and weight loss.  HENT:  Negative for drooling, ear discharge, ear pain and sore throat.   Eyes:  Negative for blurred vision.  Respiratory:  Negative for cough, shortness of breath and wheezing.   Cardiovascular:  Negative for chest pain, palpitations and leg swelling.  Gastrointestinal:  Negative for abdominal pain, blood in stool, constipation, diarrhea and nausea.  Endocrine: Negative for polydipsia, polyphagia and polyuria.  Genitourinary:  Negative for dysuria, frequency, hematuria and urgency.  Musculoskeletal:  Negative for back pain, myalgias and neck pain.  Skin:  Negative for rash.  Allergic/Immunologic: Negative for environmental allergies.  Neurological:  Negative for dizziness, seizures, speech difficulty, weakness and headaches.  Hematological:  Does not bruise/bleed easily.  Psychiatric/Behavioral:  Negative for confusion and suicidal ideas. The patient is not nervous/anxious.     Patient Active Problem List   Diagnosis Date Noted   Lumbar stenosis with neurogenic claudication 08/27/2020   Chronic bilateral low back pain with bilateral sciatica 08/27/2020   Essential hypertension 03/06/2020   Cervical radiculopathy 03/06/2020   Asymptomatic varicose veins of both lower extremities 03/06/2020   Morbid obesity ([place]) 03/29/2019   Allergic rhinitis 01/19/2018   Hx of adenomatous colonic polyps 01/14/2018   Reactive airway disease without complication [id number]   Back ache 05/22/2015   Alimentary obesity 05/22/2015   Type 2 diabetes mellitus without complication, without long-term current use of insulin ([place]) 04/12/2014   Hypercholesterolemia 04/12/2014   OP (osteoporosis) 04/12/2014    Allergies  Allergen Reactions   Codeine Other (See Comments)    Past Surgical History:  Procedure Laterality Date   ABDOMINAL HYSTERECTOMY     BREAST BIOPSY Right 06/22/11   bx/clip-neg   BREAST CYST ASPIRATION Left    neg   COLONOSCOPY  2014   cleared for 5 yrs- [place] doc   COLONOSCOPY WITH PROPOFOL N/A 03/25/2018   Procedure: COLONOSCOPY WITH PROPOFOL;  Surgeon: Manya Silvas, MD;  Location: [facility] ENDOSCOPY;  Service: Endoscopy;  Laterality: N/A;   PARTIAL HYSTERECTOMY     SPLENECTOMY      Social History   Tobacco Use   Smoking status: Never   Smokeless tobacco: Never   Tobacco comments:    smoking cessation materials not required  Vaping Use   Vaping Use: Never used  Substance Use Topics   Alcohol use: No    Alcohol/week: 0.0 standard drinks   Drug use: No     Medication list has been reviewed and updated.  Current Meds  Medication Sig   albuterol (PROVENTIL HFA;VENTOLIN HFA) 108 (90 Base) MCG/ACT inhaler Inhale 2 puffs into the lungs every 6 (six) hours as needed for wheezing or shortness of breath.   albuterol (PROVENTIL) (2.5 MG/3ML) 0.083% nebulizer solution Take 3 mLs (2.5 mg total) by nebulization every 6 (six) hours as needed for wheezing or  shortness of breath.   alendronate (FOSAMAX) 70 MG tablet TAKE ONE TABLET ONNCE A WEEK. TAKE WITH A FULL GLASS OF WATER ON AN EMPTY STOMACH.   aspirin 81 MG tablet Take 81 mg by mouth daily.   calcium-vitamin D (OSCAL WITH D) 500-200 MG-UNIT tablet Take 1 tablet by mouth.   Cholecalciferol (VITAMIN D3) 10 MCG (400 UNIT) CAPS    cyclobenzaprine (FLEXERIL) 10 MG tablet Take by mouth. mundy   EPIPEN 2-PAK 0.3 MG/0.3ML SOAJ injection    gabapentin (NEURONTIN) 300 MG capsule Take 1 capsule by mouth 2 (two) times daily.   glipiZIDE (GLIPIZIDE XL) 5 MG 24 hr tablet Take 1 tablet (5 mg total) by mouth daily.   loratadine (CLARITIN) 10 MG tablet Take 10 mg by mouth daily. otc   losartan (COZAAR) 25 MG tablet Take 1 tablet by mouth once daily   meloxicam (MOBIC) 15 MG tablet Take 1 tablet by mouth daily as needed.   metFORMIN (GLUCOPHAGE) 500 MG tablet TAKE ONE TABLET BY MOUTH TWICE DAILY   Multiple Vitamin (MULTIVITAMIN) capsule Take 1 capsule by mouth daily.   ONETOUCH ULTRA test strip USE 1 STRIP TO CHECK GLUCOSE ONCE DAILY   pioglitazone (ACTOS) 15 MG tablet Take 1 tablet (15 mg total) by mouth daily.   simvastatin (ZOCOR) 40 MG tablet Take 1 tablet by mouth once daily   [  DISCONTINUED] carbamide peroxide (DEBROX) 6.5 % OTIC solution Place 5 drops into both ears 2 (two) times daily. Prior to shower   Current Facility-Administered Medications for the 07/21/21 encounter (Office Visit) with Juline Patch, MD  Medication   albuterol (PROVENTIL) (2.5 MG/3ML) 0.083% nebulizer solution 2.5 mg   ipratropium-albuterol (DUONEB) 0.5-2.5 (3) MG/3ML nebulizer solution 3 mL    PHQ 2/9 Scores 07/21/2021 06/11/2021 11/11/2020 07/11/2020  PHQ - 2 Score 0 1 0 0  PHQ- 9 Score 0 2 0 1    GAD 7 : Generalized Anxiety Score 07/21/2021 11/11/2020 07/11/2020 04/22/2020  Nervous, Anxious, on Edge 0 0 0 0  Control/stop worrying 0 0 0 0  Worry too much - different things 0 0 0 0  Trouble relaxing 0 0 0 0  Restless 0 0 0  0  Easily annoyed or irritable 0 0 0 0  Afraid - awful might happen 0 0 0 0  Total GAD 7 Score 0 0 0 0    BP Readings from Last 3 Encounters:  05/21/21 128/76  03/18/21 130/80  11/11/20 112/70    Physical Exam Vitals and nursing note reviewed.  Constitutional:      Appearance: She is well-developed.  HENT:     Head: Normocephalic.     Right Ear: Tympanic membrane, ear canal and external ear normal.     Left Ear: Tympanic membrane, ear canal and external ear normal.     Nose: Nose normal.  Eyes:     General: Lids are everted, no foreign bodies appreciated. No scleral icterus.       Left eye: No foreign body or hordeolum.     Conjunctiva/sclera: Conjunctivae normal.     Right eye: Right conjunctiva is not injected.     Left eye: Left conjunctiva is not injected.     Pupils: Pupils are equal, round, and reactive to light.  Neck:     Thyroid: No thyromegaly.     Vascular: No carotid bruit or JVD.     Trachea: No tracheal deviation.  Cardiovascular:     Rate and Rhythm: Normal rate and regular rhythm.     Heart sounds: Normal heart sounds. No murmur heard.   No friction rub. No gallop.  Pulmonary:     Effort: Pulmonary effort is normal. No respiratory distress.     Breath sounds: Normal breath sounds. No wheezing, rhonchi or rales.  Abdominal:     General: Bowel sounds are normal.     Palpations: Abdomen is soft. There is no mass.     Tenderness: There is no abdominal tenderness. There is no guarding or rebound.  Musculoskeletal:        General: No tenderness. Normal range of motion.     Cervical back: Normal range of motion and neck supple.  Lymphadenopathy:     Cervical: No cervical adenopathy.  Skin:    General: Skin is warm.     Findings: No rash.  Neurological:     Mental Status: She is alert and oriented to person, place, and time.     Cranial Nerves: No cranial nerve deficit.     Deep Tendon Reflexes: Reflexes normal.  Psychiatric:        Mood and Affect:  Mood is not anxious or depressed.    Wt Readings from Last 3 Encounters:  05/21/21 185 lb (83.9 kg)  03/18/21 181 lb (82.1 kg)  11/11/20 180 lb (81.6 kg)    There were no vitals taken for this visit.  Assessment and Plan:  1. Type 2 diabetes mellitus without complication, without long-term current use of insulin (HCC) Chronic.  Controlled.  Stable.  Continue glipizide XL 5 mg, metformin 500 mg 1 twice a day, and pioglitazone 15 mg once a day.  Will check A1c along with comprehensive metabolic panel as well as recheck in 4 months - glipiZIDE (GLIPIZIDE XL) 5 MG 24 hr tablet; Take 1 tablet (5 mg total) by mouth daily.  Dispense: 90 tablet; Refill: 1 - metFORMIN (GLUCOPHAGE) 500 MG tablet; TAKE ONE TABLET BY MOUTH TWICE DAILY  Dispense: 180 tablet; Refill: 1 - pioglitazone (ACTOS) 15 MG tablet; Take 1 tablet (15 mg total) by mouth daily.  Dispense: 90 tablet; Refill: 1 - HgB A1c - Lipid Panel With LDL/HDL Ratio - Comprehensive Metabolic Panel (CMET)  2. Essential hypertension Chronic.  Controlled.  Stable.  Blood pressure today is stable at 122/80.  We will continue losartan 25 mg once a day.  And check CMP for electrolytes and GFR. - losartan (COZAAR) 25 MG tablet; Take 1 tablet (25 mg total) by mouth daily.  Dispense: 90 tablet; Refill: 1 - Comprehensive Metabolic Panel (CMET)  3. Hypercholesterolemia Chronic.  Controlled.  Stable.  Continue simvastatin 40 mg once a day.  Will check lipid panel for current status of LDL. - simvastatin (ZOCOR) 40 MG tablet; Take 1 tablet by mouth once daily  Dispense: 90 tablet; Refill: 1 - Lipid Panel With LDL/HDL Ratio

## 2021-07-22 ENCOUNTER — Other Ambulatory Visit: Payer: Self-pay

## 2021-07-22 ENCOUNTER — Ambulatory Visit
Admission: RE | Admit: 2021-07-22 | Discharge: 2021-07-22 | Disposition: A | Discharge status: Home / Self Care | Payer: Medicare PPO | Source: Ambulatory Visit | Attending: Family Medicine | Admitting: Family Medicine

## 2021-07-22 DIAGNOSIS — Z780 Asymptomatic menopausal state: Secondary | ICD-10-CM | POA: Insufficient documentation

## 2021-07-22 DIAGNOSIS — M85852 Other specified disorders of bone density and structure, left thigh: Secondary | ICD-10-CM | POA: Diagnosis not present

## 2021-07-22 LAB — LIPID PANEL WITH LDL/HDL RATIO
Cholesterol, Total: 150 mg/dL (ref 100–199)
HDL: 51 mg/dL (ref >39)
LDL Chol Calc (NIH): 77 mg/dL (ref 0–99)
LDL/HDL Ratio: 1.5 ratio (ref 0.0–3.2)
Triglycerides: 121 mg/dL (ref 0–149)
VLDL Cholesterol Cal: 22 mg/dL (ref 5–40)

## 2021-07-22 LAB — COMPREHENSIVE METABOLIC PANEL
ALT: 18 IU/L (ref 0–32)
AST: 14 IU/L (ref 0–40)
Albumin/Globulin Ratio: 2.4 — ABNORMAL HIGH (ref 1.2–2.2)
Albumin: 5.3 g/dL — ABNORMAL HIGH (ref 3.7–4.7)
Alkaline Phosphatase: 51 IU/L (ref 44–121)
BUN/Creatinine Ratio: 33 — ABNORMAL HIGH (ref 12–28)
BUN: 18 mg/dL (ref 8–27)
Bilirubin Total: 0.3 mg/dL (ref 0.0–1.2)
CO2: 25 mmol/L (ref 20–29)
Calcium: 10.4 mg/dL — ABNORMAL HIGH (ref 8.7–10.3)
Chloride: 97 mmol/L (ref 96–106)
Creatinine, Ser: 0.54 mg/dL — ABNORMAL LOW (ref 0.57–1.00)
Globulin, Total: 2.2 g/dL (ref 1.5–4.5)
Glucose: 133 mg/dL — ABNORMAL HIGH (ref 65–99)
Potassium: 4.6 mmol/L (ref 3.5–5.2)
Sodium: 139 mmol/L (ref 134–144)
Total Protein: 7.5 g/dL (ref 6.0–8.5)
eGFR: 98 mL/min/{1.73_m2} (ref >59)

## 2021-07-22 LAB — HEMOGLOBIN A1C
Est. average glucose Bld gHb Est-mCnc: 143 mg/dL
Hgb A1c MFr Bld: 6.6 % — ABNORMAL HIGH (ref 4.8–5.6)

## 2021-07-29 DIAGNOSIS — D2271 Melanocytic nevi of right lower limb, including hip: Secondary | ICD-10-CM | POA: Diagnosis not present

## 2021-07-29 DIAGNOSIS — D2262 Melanocytic nevi of left upper limb, including shoulder: Secondary | ICD-10-CM | POA: Diagnosis not present

## 2021-07-29 DIAGNOSIS — L821 Other seborrheic keratosis: Secondary | ICD-10-CM | POA: Diagnosis not present

## 2021-07-29 DIAGNOSIS — D225 Melanocytic nevi of trunk: Secondary | ICD-10-CM | POA: Diagnosis not present

## 2021-07-29 DIAGNOSIS — D2272 Melanocytic nevi of left lower limb, including hip: Secondary | ICD-10-CM | POA: Diagnosis not present

## 2021-07-29 DIAGNOSIS — D2261 Melanocytic nevi of right upper limb, including shoulder: Secondary | ICD-10-CM | POA: Diagnosis not present

## 2021-08-06 ENCOUNTER — Ambulatory Visit: Payer: Self-pay | Admitting: *Deleted

## 2021-08-06 NOTE — Telephone Encounter (Signed)
Note  Pt scheduled for tomorrow 08/07/2021.  KP

## 2021-08-06 NOTE — Telephone Encounter (Signed)
C/o left arm pain x 1 month . Increased pain and difficulty with ADL's . Reports completing therapy , seeing neurologist and still having pain in left arm from shoulder to elbow to fingers. Left pointer finger painful. Denies N/T . Appt scheduled for 08/07/21. Care advise given. Patient verbalized understanding of care advise and to call back or go to [facility] or ED if symptoms worsen.

## 2021-08-06 NOTE — Telephone Encounter (Signed)
Reason for Disposition  [1] MODERATE pain (e.g., interferes with normal activities) AND [2] present > 3 days  Answer Assessment - Initial Assessment Questions 1. ONSET: "When did the pain start?"     X 1 month ago  2. LOCATION: "Where is the pain located?"     Left arm from shoulder to elbow to hand  3. PAIN: "How bad is the pain?" (Scale 1-10; or mild, moderate, severe)   - MILD (1-3): doesn't interfere with normal activities   - MODERATE (4-7): interferes with normal activities (e.g., work or school) or awakens from sleep   - SEVERE (8-10): excruciating pain, unable to do any normal activities, unable to hold a cup of water     5-6/10 with movement , difficulty sleeping on left side  4. WORK OR EXERCISE: "Has there been any recent work or exercise that involved this part of the body?"     no 5. CAUSE: "What do you think is causing the arm pain?"     Not sure  6. OTHER SYMPTOMS: "Do you have any other symptoms?" (e.g., neck pain, swelling, rash, fever, numbness, weakness)     No  7. PREGNANCY: "Is there any chance you are pregnant?" "When was your last menstrual period?"     na  Protocols used: Arm Pain-A-AH

## 2021-08-07 ENCOUNTER — Ambulatory Visit: Payer: Medicare PPO | Admitting: Family Medicine

## 2021-08-07 ENCOUNTER — Ambulatory Visit
Admission: RE | Admit: 2021-08-07 | Discharge: 2021-08-07 | Disposition: A | Discharge status: Home / Self Care | Payer: Medicare PPO | Attending: Family Medicine | Admitting: Family Medicine

## 2021-08-07 ENCOUNTER — Other Ambulatory Visit: Payer: Self-pay

## 2021-08-07 ENCOUNTER — Ambulatory Visit
Admission: RE | Admit: 2021-08-07 | Discharge: 2021-08-07 | Disposition: A | Discharge status: Home / Self Care | Payer: Medicare PPO | Source: Ambulatory Visit | Attending: Family Medicine | Admitting: Family Medicine

## 2021-08-07 ENCOUNTER — Encounter: Payer: Self-pay | Admitting: Family Medicine

## 2021-08-07 VITALS — BP 124/70 | HR 80 | Ht 63.0 in | Wt 182.0 lb

## 2021-08-07 DIAGNOSIS — M5030 Other cervical disc degeneration, unspecified cervical region: Secondary | ICD-10-CM

## 2021-08-07 DIAGNOSIS — M7542 Impingement syndrome of left shoulder: Secondary | ICD-10-CM | POA: Insufficient documentation

## 2021-08-07 DIAGNOSIS — M542 Cervicalgia: Secondary | ICD-10-CM | POA: Diagnosis not present

## 2021-08-07 DIAGNOSIS — M19012 Primary osteoarthritis, left shoulder: Secondary | ICD-10-CM | POA: Diagnosis not present

## 2021-08-07 MED ORDER — PREDNISONE 10 MG PO TABS: 10.0000 mg | ORAL_TABLET | Freq: Every day | ORAL | 0 refills | Status: DC

## 2021-08-07 NOTE — Progress Notes (Signed)
Date:  08/07/2021   Name:  Lindsey George   DOB:  [birth year]   MRN:  [id number]   Chief Complaint: Arm Pain (L) arm pain- limited ROM hurts into hand, thumb and index finger)  Arm Pain  There was no injury mechanism. The pain is present in the left shoulder. The quality of the pain is described as aching and stabbing. The pain radiates to the left arm and left hand. The pain is at a severity of 7/10. The pain is moderate. The pain has been Intermittent since the incident. Associated symptoms include numbness. Pertinent negatives include no chest pain. Treatments tried: gabapentin/meloxicam.   Lab Results  Component Value Date   CREATININE 0.54 (L) 07/21/2021   BUN 18 07/21/2021   NA 139 07/21/2021   K 4.6 07/21/2021   CL 97 07/21/2021   CO2 25 07/21/2021   Lab Results  Component Value Date   CHOL 150 07/21/2021   HDL 51 07/21/2021   LDLCALC 77 07/21/2021   TRIG 121 07/21/2021   CHOLHDL 2.9 09/19/2018   No results found for: TSH Lab Results  Component Value Date   HGBA1C 6.6 (H) 07/21/2021   Lab Results  Component Value Date   WBC 12.1 (H) 08/11/2018   HGB 12.9 08/11/2018   HCT 40.8 08/11/2018   MCV 80.5 08/11/2018   PLT 394 08/11/2018   Lab Results  Component Value Date   ALT 18 07/21/2021   AST 14 07/21/2021   ALKPHOS 51 07/21/2021   BILITOT 0.3 07/21/2021     Review of Systems  Constitutional:  Negative for chills and fever.  HENT:  Negative for congestion, drooling, ear discharge, ear pain and sore throat.   Respiratory:  Negative for cough, shortness of breath and wheezing.   Cardiovascular:  Negative for chest pain, palpitations and leg swelling.  Gastrointestinal:  Negative for abdominal pain, blood in stool, constipation, diarrhea and nausea.  Endocrine: Negative for polydipsia.  Genitourinary:  Negative for dysuria, frequency, hematuria and urgency.  Musculoskeletal:  Negative for back pain, myalgias and neck pain.  Skin:  Negative for rash.   Allergic/Immunologic: Negative for environmental allergies.  Neurological:  Positive for numbness. Negative for dizziness and headaches.  Hematological:  Does not bruise/bleed easily.  Psychiatric/Behavioral:  Negative for suicidal ideas. The patient is not nervous/anxious.    Patient Active Problem List   Diagnosis Date Noted   Lumbar stenosis with neurogenic claudication 08/27/2020   Chronic bilateral low back pain with bilateral sciatica 08/27/2020   Essential hypertension 03/06/2020   Cervical radiculopathy 03/06/2020   Asymptomatic varicose veins of both lower extremities 03/06/2020   Morbid obesity ([place]) 03/29/2019   Allergic rhinitis 01/19/2018   Hx of adenomatous colonic polyps 01/14/2018   Reactive airway disease without complication [id number]   Back ache 05/22/2015   Alimentary obesity 05/22/2015   Type 2 diabetes mellitus without complication, without long-term current use of insulin ([place]) 04/12/2014   Hypercholesterolemia 04/12/2014   OP (osteoporosis) 04/12/2014    Allergies  Allergen Reactions   Codeine Other (See Comments)    Past Surgical History:  Procedure Laterality Date   ABDOMINAL HYSTERECTOMY     BREAST BIOPSY Right 06/22/11   bx/clip-neg   BREAST CYST ASPIRATION Left    neg   COLONOSCOPY  2014   cleared for 5 yrs- [place] doc   COLONOSCOPY WITH PROPOFOL N/A 03/25/2018   Procedure: COLONOSCOPY WITH PROPOFOL;  Surgeon: Manya Silvas, MD;  Location[facility] ENDOSCOPY;  Service: Endoscopy;  Laterality: N/A;   PARTIAL HYSTERECTOMY     SPLENECTOMY      Social History   Tobacco Use   Smoking status: Never   Smokeless tobacco: Never   Tobacco comments:    smoking cessation materials not required  Vaping Use   Vaping Use: Never used  Substance Use Topics   Alcohol use: No    Alcohol/week: 0.0 standard drinks   Drug use: No     Medication list has been reviewed and updated.  Current Meds  Medication Sig   albuterol (PROVENTIL HFA;VENTOLIN HFA)  108 (90 Base) MCG/ACT inhaler Inhale 2 puffs into the lungs every 6 (six) hours as needed for wheezing or shortness of breath.   albuterol (PROVENTIL) (2.5 MG/3ML) 0.083% nebulizer solution Take 3 mLs (2.5 mg total) by nebulization every 6 (six) hours as needed for wheezing or shortness of breath.   alendronate (FOSAMAX) 70 MG tablet TAKE ONE TABLET ONNCE A WEEK. TAKE WITH A FULL GLASS OF WATER ON AN EMPTY STOMACH.   aspirin 81 MG tablet Take 81 mg by mouth daily.   calcium-vitamin D (OSCAL WITH D) 500-200 MG-UNIT tablet Take 1 tablet by mouth.   Cholecalciferol (VITAMIN D3) 10 MCG (400 UNIT) CAPS    cyclobenzaprine (FLEXERIL) 10 MG tablet Take by mouth. mundy   EPIPEN 2-PAK 0.3 MG/0.3ML SOAJ injection    gabapentin (NEURONTIN) 300 MG capsule Take 1 capsule by mouth 2 (two) times daily.   glipiZIDE (GLIPIZIDE XL) 5 MG 24 hr tablet Take 1 tablet (5 mg total) by mouth daily.   loratadine (CLARITIN) 10 MG tablet Take 10 mg by mouth daily. otc   losartan (COZAAR) 25 MG tablet Take 1 tablet (25 mg total) by mouth daily.   meloxicam (MOBIC) 15 MG tablet Take 1 tablet by mouth daily as needed.   metFORMIN (GLUCOPHAGE) 500 MG tablet TAKE ONE TABLET BY MOUTH TWICE DAILY   Multiple Vitamin (MULTIVITAMIN) capsule Take 1 capsule by mouth daily.   ONETOUCH ULTRA test strip USE 1 STRIP TO CHECK GLUCOSE ONCE DAILY   pioglitazone (ACTOS) 15 MG tablet Take 1 tablet (15 mg total) by mouth daily.   simvastatin (ZOCOR) 40 MG tablet Take 1 tablet by mouth once daily   Current Facility-Administered Medications for the 08/07/21 encounter (Office Visit) with Juline Patch, MD  Medication   albuterol (PROVENTIL) (2.5 MG/3ML) 0.083% nebulizer solution 2.5 mg   ipratropium-albuterol (DUONEB) 0.5-2.5 (3) MG/3ML nebulizer solution 3 mL    PHQ 2/9 Scores 07/21/2021 06/11/2021 11/11/2020 07/11/2020  PHQ - 2 Score 0 1 0 0  PHQ- 9 Score 0 2 0 1    GAD 7 : Generalized Anxiety Score 07/21/2021 11/11/2020 07/11/2020 04/22/2020   Nervous, Anxious, on Edge 0 0 0 0  Control/stop worrying 0 0 0 0  Worry too much - different things 0 0 0 0  Trouble relaxing 0 0 0 0  Restless 0 0 0 0  Easily annoyed or irritable 0 0 0 0  Afraid - awful might happen 0 0 0 0  Total GAD 7 Score 0 0 0 0    BP Readings from Last 3 Encounters:  08/07/21 124/70  07/21/21 122/80  05/21/21 128/76    Physical Exam Vitals and nursing note reviewed.  Constitutional:      Appearance: She is well-developed.  HENT:     Head: Normocephalic.     Right Ear: External ear normal.     Left Ear: External ear normal.  Eyes:  General: Lids are everted, no foreign bodies appreciated. No scleral icterus.       Left eye: No foreign body or hordeolum.     Conjunctiva/sclera: Conjunctivae normal.     Right eye: Right conjunctiva is not injected.     Left eye: Left conjunctiva is not injected.     Pupils: Pupils are equal, round, and reactive to light.  Neck:     Thyroid: No thyromegaly.     Vascular: No JVD.     Trachea: No tracheal deviation.  Cardiovascular:     Rate and Rhythm: Normal rate and regular rhythm.     Heart sounds: Normal heart sounds. No murmur heard.   No friction rub. No gallop.  Pulmonary:     Effort: Pulmonary effort is normal. No respiratory distress.     Breath sounds: Normal breath sounds. No wheezing or rales.  Abdominal:     General: Bowel sounds are normal.     Palpations: Abdomen is soft. There is no mass.     Tenderness: There is no abdominal tenderness. There is no guarding or rebound.  Musculoskeletal:     Left shoulder: Tenderness present. Decreased range of motion. Normal strength.     Cervical back: Normal range of motion and neck supple.     Comments: Tender supraspintis  Lymphadenopathy:     Cervical: No cervical adenopathy.  Skin:    General: Skin is warm.     Findings: No rash.  Neurological:     Mental Status: She is alert and oriented to person, place, and time.     Cranial Nerves: No cranial  nerve deficit.     Sensory: Sensation is intact.     Motor: Motor function is intact.     Deep Tendon Reflexes: Reflexes normal.     Reflex Scores:      Tricep reflexes are 2+ on the right side and 2+ on the left side.      Bicep reflexes are 2+ on the right side and 2+ on the left side.      Brachioradialis reflexes are 2+ on the right side and 2+ on the left side.      Patellar reflexes are 2+ on the right side and 2+ on the left side.      Achilles reflexes are 2+ on the right side and 2+ on the left side. Psychiatric:        Mood and Affect: Mood is not anxious or depressed.    Wt Readings from Last 3 Encounters:  08/07/21 182 lb (82.6 kg)  07/21/21 182 lb (82.6 kg)  05/21/21 185 lb (83.9 kg)    BP 124/70   Pulse 80   Ht '5\' 3"'$  (1.6 m)   Wt 182 lb (82.6 kg)   BMI 32.24 kg/m   Assessment and Plan: 1. DDD (degenerative disc disease), cervical Chronic.  Episodic and now persistent.  Symptomatic but reasonably stable.  Patient is having discomfort that I think is more radiating from the cervical disc to the shoulder and down the left arm over the radial nerve path.  We will continue her meloxicam as well as her gabapentin.  At this time we will initiate prednisone 10 mg 1 a day to reduce some inflammation and refer to Dr. Zigmund Daniel for evaluation along with below circumstance.  We will also get a cervical spine as that is been several years since last time. - DG Cervical Spine Complete; Future - predniSONE (DELTASONE) 10 MG tablet; Take 1 tablet (  10 mg total) by mouth daily with breakfast.  Dispense: 30 tablet; Refill: 0  2. Impingement syndrome of left shoulder New onset.  Persistent.  Stable.  But patient is unable to abduct sufficiently to do her hair.  This is noted to have tenderness of the supraspinatus tendon prior to the [facility] ligament.  I suspect there is a level of impingement and we will initiate prednisone and obtain an x-ray of the shoulder. - DG Shoulder Left; Future -  predniSONE (DELTASONE) 10 MG tablet; Take 1 tablet (10 mg total) by mouth daily with breakfast.  Dispense: 30 tablet; Refill: 0

## 2021-08-11 NOTE — Progress Notes (Signed)
Please advise patient to continue current treatments by Dr. Ronnald Ramp and keep visit as scheduled with me on 9/19.

## 2021-08-15 ENCOUNTER — Encounter: Payer: Medicare PPO | Admitting: Family Medicine

## 2021-08-18 ENCOUNTER — Other Ambulatory Visit: Payer: Self-pay

## 2021-08-18 ENCOUNTER — Encounter: Payer: Self-pay | Admitting: Family Medicine

## 2021-08-18 ENCOUNTER — Ambulatory Visit: Payer: Medicare PPO | Admitting: Family Medicine

## 2021-08-18 VITALS — BP 112/60 | HR 75 | Temp 98.0°F | Ht 63.0 in | Wt 186.0 lb

## 2021-08-18 DIAGNOSIS — M7542 Impingement syndrome of left shoulder: Secondary | ICD-10-CM | POA: Insufficient documentation

## 2021-08-18 DIAGNOSIS — M5412 Radiculopathy, cervical region: Secondary | ICD-10-CM

## 2021-08-18 NOTE — Progress Notes (Signed)
Follow-up operate self course just reschedule    Primary Care / Sports Medicine Office Visit  Patient Information:  Patient ID: Lindsey George, female DOB: [birth year] Age: 70 y.o. MRN: [id number]   Lindsey George is a pleasant 70 y.o. female presenting with the following:  Chief Complaint  Patient presents with   New Patient (Initial Visit)   Shoulder Pain    Left; limited ROM; several months per patient; no known injury; X-Rays shoulder and cervical 08/07/2021; on prednisone 10 mg daily for inflammation with some relief; right-handedness; 5/10 pain    Review of Systems pertinent details above   Patient Active Problem List   Diagnosis Date Noted   Impingement syndrome of left shoulder 08/18/2021   Hand pain, left 06/26/2021   Loss of memory 06/26/2021   Tremor 06/26/2021   Lumbar stenosis with neurogenic claudication 08/27/2020   Chronic bilateral low back pain with bilateral sciatica 08/27/2020   Essential hypertension 03/06/2020   Cervical radiculopathy 03/06/2020   Asymptomatic varicose veins of both lower extremities 03/06/2020   Morbid obesity ([place]) 03/29/2019   Allergic rhinitis 01/19/2018   Hx of adenomatous colonic polyps 01/14/2018   Reactive airway disease without complication [id number]   Back ache 05/22/2015   Alimentary obesity 05/22/2015   Type 2 diabetes mellitus without complication, without long-term current use of insulin ([place]) 04/12/2014   Hypercholesterolemia 04/12/2014   OP (osteoporosis) 04/12/2014   Past Medical History:  Diagnosis Date   Asthma    Cancer ([place])    skin ca   Chronic back pain    Diabetes mellitus without complication ([place])    Hyperlipidemia    Hypertension    Obesity    Osteoporosis    Outpatient Encounter Medications as of 08/18/2021  Medication Sig   albuterol (PROVENTIL HFA;VENTOLIN HFA) 108 (90 Base) MCG/ACT inhaler Inhale 2 puffs into the lungs every 6 (six) hours as needed for wheezing or shortness of breath.    albuterol (PROVENTIL) (2.5 MG/3ML) 0.083% nebulizer solution Take 3 mLs (2.5 mg total) by nebulization every 6 (six) hours as needed for wheezing or shortness of breath.   alendronate (FOSAMAX) 70 MG tablet TAKE ONE TABLET ONNCE A WEEK. TAKE WITH A FULL GLASS OF WATER ON AN EMPTY STOMACH.   aspirin 81 MG tablet Take 81 mg by mouth daily.   calcium-vitamin D (OSCAL WITH D) 500-200 MG-UNIT tablet Take 1 tablet by mouth daily with breakfast.   Cholecalciferol (VITAMIN D3 PO) Take 1 capsule by mouth daily.   EPIPEN 2-PAK 0.3 MG/0.3ML SOAJ injection Inject 0.3 mg into the muscle as needed.   glipiZIDE (GLIPIZIDE XL) 5 MG 24 hr tablet Take 1 tablet (5 mg total) by mouth daily.   loratadine (CLARITIN) 10 MG tablet Take 10 mg by mouth daily. otc   losartan (COZAAR) 25 MG tablet Take 1 tablet (25 mg total) by mouth daily.   metFORMIN (GLUCOPHAGE) 500 MG tablet TAKE ONE TABLET BY MOUTH TWICE DAILY   Multiple Vitamin (MULTIVITAMIN) capsule Take 1 capsule by mouth daily.   ONETOUCH ULTRA test strip USE 1 STRIP TO CHECK GLUCOSE ONCE DAILY   pioglitazone (ACTOS) 15 MG tablet Take 1 tablet (15 mg total) by mouth daily.   predniSONE (DELTASONE) 10 MG tablet Take 1 tablet (10 mg total) by mouth daily with breakfast.   simvastatin (ZOCOR) 40 MG tablet Take 1 tablet by mouth once daily   [DISCONTINUED] meloxicam (MOBIC) 15 MG tablet Take 15 mg by mouth daily.   cyclobenzaprine (FLEXERIL) 10  MG tablet Take by mouth. mundy (Patient not taking: Reported on 08/18/2021)   gabapentin (NEURONTIN) 300 MG capsule Take 1 capsule by mouth 2 (two) times daily. (Patient not taking: Reported on 08/18/2021)   meloxicam (MOBIC) 15 MG tablet Take 1 tablet by mouth daily as needed. (Patient not taking: Reported on 08/18/2021)   Facility-Administered Encounter Medications as of 08/18/2021  Medication   ipratropium-albuterol (DUONEB) 0.5-2.5 (3) MG/3ML nebulizer solution 3 mL   [DISCONTINUED] albuterol (PROVENTIL) (2.5 MG/3ML) 0.083%  nebulizer solution 2.5 mg   Past Surgical History:  Procedure Laterality Date   ABDOMINAL HYSTERECTOMY     BREAST BIOPSY Right 06/22/11   bx/clip-neg   BREAST CYST ASPIRATION Left    neg   COLONOSCOPY  2014   cleared for 5 yrs- [facility]   COLONOSCOPY WITH PROPOFOL N/A 03/25/2018   Procedure: COLONOSCOPY WITH PROPOFOL;  Surgeon: Manya Silvas, MD;  Location: [facility] ENDOSCOPY;  Service: Endoscopy;  Laterality: N/A;   PARTIAL HYSTERECTOMY     SPLENECTOMY      Vitals:   08/18/21 0914  BP: 112/60  Pulse: 75  Temp: 98 F (36.7 C)  SpO2: 95%   Vitals:   08/18/21 0914  Weight: 186 lb (84.4 kg)  Height: '5\' 3"'$  (1.6 m)   Body mass index is 32.95 kg/m.    Independent interpretation of notes and tests performed by another provider:   Independent interpretation of cervical spine x-ray dated 08/07/2021 reveals multilevel degenerative changes maximally noted at C4-5, C5-6, secondarily to C6-7 there is intervertebral narrowing, facet hypertrophy, straightening of the cervical spine noted.  No acute osseous process identified.  Independent interpretation of left shoulder x-ray dated 08/07/2021 reveals elevated humeral head positioning, contact with the subacromial and associated cortical roughening, these can be seen in the setting of rotator cuff dysfunction, glenohumeral articulation and acromioclavicular articulation appear overall benign, no acute osseous process identified.  Procedures performed:   None  Pertinent History, Exam, Impression, and Recommendations:   Impingement syndrome of left shoulder This is a chronic issue with recent exacerbation, recent x-rays revealed elevated elements of chronicity and her exam is consistent with supraspinatus involvement though I am able to appreciate 4/5 strength during isolated resisted testing, positive Neer's, positive Hawkins, remainder rotator cuff testing appears benign.  At this stage she is requesting conservative management, in that  regard I have advised continuation of previously prescribed prednisone, she is to restart meloxicam once daily, and initiate a home-based rehab program with additional focus on attaining maximal range of motion.  She will return for follow-up in 2 weeks time, if suboptimal progress noted, will discuss the utility of subacromial injection versus oral pharmacotherapy options.  Once able to do so, she would benefit from formal morcellate and home-based rehab.  Cervical radiculopathy Patient with left upper extremity symptomatology raising concern for cervical radiculopathy, her x-rays reveal multilevel involvement that could account for her symptoms in her left hand.  Negative Spurling's and cervical motion is painless, consistent with degenerative changes noted on x-rays.  Her left hand symptoms could also be secondary to neural impingement at the wrist.  Given that her symptoms from this are minimal, per patient, will review their presence after shoulder has been optimized.  We will continue to follow this issue.    Orders & Medications No orders of the defined types were placed in this encounter.  No orders of the defined types were placed in this encounter.    Return in about 2 years (around 08/19/2023).  Montel Culver, MD   Primary Care Sports Medicine [place]

## 2021-08-18 NOTE — Patient Instructions (Signed)
-   Continue previously prescribed prednisone - Restart meloxicam daily, take with largest meal - Start home exercises with information provided, focus on gradual advance (do not push through pain) - Utilize ice at 20-minute intervals at the shoulder at least 2-3 times per day, can alternate with heat - Return for follow-up in 2 weeks, contact our office for questions or concerns between now and then

## 2021-08-18 NOTE — Assessment & Plan Note (Signed)
Patient with left upper extremity symptomatology raising concern for cervical radiculopathy, her x-rays reveal multilevel involvement that could account for her symptoms in her left hand.  Negative Spurling's and cervical motion is painless, consistent with degenerative changes noted on x-rays.  Her left hand symptoms could also be secondary to neural impingement at the wrist.  Given that her symptoms from this are minimal, per patient, will review their presence after shoulder has been optimized.  We will continue to follow this issue.

## 2021-08-18 NOTE — Assessment & Plan Note (Signed)
This is a chronic issue with recent exacerbation, recent x-rays revealed elevated elements of chronicity and her exam is consistent with supraspinatus involvement though I am able to appreciate 4/5 strength during isolated resisted testing, positive Neer's, positive Hawkins, remainder rotator cuff testing appears benign.  At this stage she is requesting conservative management, in that regard I have advised continuation of previously prescribed prednisone, she is to restart meloxicam once daily, and initiate a home-based rehab program with additional focus on attaining maximal range of motion.  She will return for follow-up in 2 weeks time, if suboptimal progress noted, will discuss the utility of subacromial injection versus oral pharmacotherapy options.  Once able to do so, she would benefit from formal morcellate and home-based rehab.

## 2021-09-09 ENCOUNTER — Ambulatory Visit: Payer: Medicare PPO | Admitting: Family Medicine

## 2021-09-10 ENCOUNTER — Ambulatory Visit: Payer: Medicare PPO | Admitting: Family Medicine

## 2021-09-10 ENCOUNTER — Encounter: Payer: Self-pay | Admitting: Family Medicine

## 2021-09-10 ENCOUNTER — Other Ambulatory Visit: Payer: Self-pay

## 2021-09-10 VITALS — BP 132/72 | HR 85 | Ht 63.0 in | Wt 187.0 lb

## 2021-09-10 DIAGNOSIS — Z23 Encounter for immunization: Secondary | ICD-10-CM

## 2021-09-10 DIAGNOSIS — M7542 Impingement syndrome of left shoulder: Secondary | ICD-10-CM

## 2021-09-10 DIAGNOSIS — M5412 Radiculopathy, cervical region: Secondary | ICD-10-CM | POA: Diagnosis not present

## 2021-09-10 MED ORDER — DULOXETINE HCL 20 MG PO CPEP: ORAL_CAPSULE | ORAL | 0 refills | Status: DC

## 2021-09-10 NOTE — Assessment & Plan Note (Signed)
Patient has demonstrated interval improvement in her left shoulder symptoms with now negative Neer's and Hawkins, mild discomfort with full pain during isolated supraspinatus and subscapularis testing.  We will follow this issue peripherally.

## 2021-09-10 NOTE — Progress Notes (Signed)
Primary Care / Sports Medicine Office Visit  Patient Information:  Patient ID: Lindsey George, female DOB: Apr 20, 1951 Age: 70 y.o. MRN: [id number]   Lindsey George is a pleasant 70 y.o. female presenting with the following:  Chief Complaint  Patient presents with   Impingement syndrome of left shoulder    X-Ray 08/07/21; denies shoulder pain, but having intermittent sharp pain down the arm into fingers; history of back injections 08/2020; taking gabapentin 300 mg twice daily, cyclobenzaprine nightly as needed, and finished course of prednisone; 8/10 pain   DDD (degenerative disc disease), cervical    X-Ray 08/07/21; no pain in neck today    Review of Systems pertinent details above   Patient Active Problem List   Diagnosis Date Noted   Impingement syndrome of left shoulder 08/18/2021   Hand pain, left 06/26/2021   Loss of memory 06/26/2021   Tremor 06/26/2021   Lumbar stenosis with neurogenic claudication 08/27/2020   Chronic bilateral low back pain with bilateral sciatica 08/27/2020   Essential hypertension 03/06/2020   Cervical radiculopathy 03/06/2020   Asymptomatic varicose veins of both lower extremities 03/06/2020   Morbid obesity (Lindsey George) 03/29/2019   Allergic rhinitis 01/19/2018   Hx of adenomatous colonic polyps 01/14/2018   Reactive airway disease without complication 82/42/3536   Back ache 05/22/2015   Alimentary obesity 05/22/2015   Type 2 diabetes mellitus without complication, without long-term current use of insulin (Lindsey George) 04/12/2014   Hypercholesterolemia 04/12/2014   OP (osteoporosis) 04/12/2014   Past Medical History:  Diagnosis Date   Asthma    Cancer (Lindsey George)    skin ca   Chronic back pain    Diabetes mellitus without complication (Lindsey George)    Hyperlipidemia    Hypertension    Obesity    Osteoporosis    Outpatient Encounter Medications as of 09/10/2021  Medication Sig   albuterol (PROVENTIL HFA;VENTOLIN HFA) 108 (90 Base) MCG/ACT inhaler Inhale  2 puffs into the lungs every 6 (six) hours as needed for wheezing or shortness of breath.   albuterol (PROVENTIL) (2.5 MG/3ML) 0.083% nebulizer solution Take 3 mLs (2.5 mg total) by nebulization every 6 (six) hours as needed for wheezing or shortness of breath.   alendronate (FOSAMAX) 70 MG tablet TAKE ONE TABLET ONNCE A WEEK. TAKE WITH A FULL GLASS OF WATER ON AN EMPTY STOMACH.   aspirin 81 MG tablet Take 81 mg by mouth daily.   calcium-vitamin D (OSCAL WITH D) 500-200 MG-UNIT tablet Take 1 tablet by mouth daily with breakfast.   Cholecalciferol (VITAMIN D3 PO) Take 1 capsule by mouth daily.   DULoxetine (CYMBALTA) 20 MG capsule Take 1 capsule (20 mg total) by mouth every evening for 14 days, THEN 2 capsules (40 mg total) every evening for 16 days.   EPIPEN 2-PAK 0.3 MG/0.3ML SOAJ injection Inject 0.3 mg into the muscle as needed.   gabapentin (NEURONTIN) 300 MG capsule Take 1 capsule by mouth 2 (two) times daily.   glipiZIDE (GLIPIZIDE XL) 5 MG 24 hr tablet Take 1 tablet (5 mg total) by mouth daily.   loratadine (CLARITIN) 10 MG tablet Take 10 mg by mouth daily. otc   losartan (COZAAR) 25 MG tablet Take 1 tablet (25 mg total) by mouth daily.   metFORMIN (GLUCOPHAGE) 500 MG tablet TAKE ONE TABLET BY MOUTH TWICE DAILY   Multiple Vitamin (MULTIVITAMIN) capsule Take 1 capsule by mouth daily.   ONETOUCH ULTRA test strip USE 1 STRIP TO CHECK GLUCOSE ONCE DAILY   pioglitazone (  ACTOS) 15 MG tablet Take 1 tablet (15 mg total) by mouth daily.   simvastatin (ZOCOR) 40 MG tablet Take 1 tablet by mouth once daily   [DISCONTINUED] cyclobenzaprine (FLEXERIL) 10 MG tablet Take by mouth. mundy   [DISCONTINUED] meloxicam (MOBIC) 15 MG tablet Take 1 tablet by mouth daily as needed. (Patient not taking: No sig reported)   [DISCONTINUED] predniSONE (DELTASONE) 10 MG tablet Take 1 tablet (10 mg total) by mouth daily with breakfast.   Facility-Administered Encounter Medications as of 09/10/2021  Medication    ipratropium-albuterol (DUONEB) 0.5-2.5 (3) MG/3ML nebulizer solution 3 mL   Past Surgical History:  Procedure Laterality Date   ABDOMINAL HYSTERECTOMY     BREAST BIOPSY Right 06/22/11   bx/clip-neg   BREAST CYST ASPIRATION Left    neg   COLONOSCOPY  2014   cleared for 5 yrs- [facility] doc   COLONOSCOPY WITH PROPOFOL N/A 03/25/2018   Procedure: COLONOSCOPY WITH PROPOFOL;  Surgeon: Manya Silvas, MD;  Location: [facility] ENDOSCOPY;  Service: Endoscopy;  Laterality: N/A;   PARTIAL HYSTERECTOMY     SPLENECTOMY      Vitals:   09/10/21 1104  BP: 132/72  Pulse: 85  SpO2: 97%   Vitals:   09/10/21 1104  Weight: 187 lb (84.8 kg)  Height: 5\' 3"  (1.6 m)   Body mass index is 33.13 kg/m.  No results found.   Independent interpretation of notes and tests performed by another provider:   None  Procedures performed:   None  Pertinent History, Exam, Impression, and Recommendations:   Impingement syndrome of left shoulder Patient has demonstrated interval improvement in her left shoulder symptoms with now negative Neer's and Hawkins, mild discomfort with full pain during isolated supraspinatus and subscapularis testing.  We will follow this issue peripherally.  Cervical radiculopathy Patient has persistent complaints over symmetric paraspinal tightness, numbness throughout the left upper extremity.  She did complete previously written prednisone but did not initiate meloxicam and has noted mild symptom recurrence since prednisone completed.  Examination today reveals limited range of motion primarily with extension, equivocal left Spurling's, moderate tenderness bilaterally throughout the levator scapulae, upper trapezius, rhomboids.  Sensorimotor otherwise intact in bilateral upper extremities.  She does have a history of lumbosacral issues which had responded to injections through spine group, this option was discussed for the cervical spine and she is hesitant to proceed down that pathway.  As  such, I did discuss alternative treatment options given her age and comorbid medical risk factors.  We will discontinue meloxicam for CVA risk, hold from cyclobenzaprine due to adverse effect profile, she can continue gabapentin through outside office, we will initiate scheduled duloxetine at 20 mg x 2 weeks then titrate to 40 mg if tolerating well, and coordinate a follow-up in 4 weeks time.  If she tolerates medication well and if it adequately control symptoms, this could be continued as an adjunct to home-based versus formal physical therapy.  Can also consider further titration to not exceed 60 mg daily.   Orders & Medications Meds ordered this encounter  Medications   DULoxetine (CYMBALTA) 20 MG capsule    Sig: Take 1 capsule (20 mg total) by mouth every evening for 14 days, THEN 2 capsules (40 mg total) every evening for 16 days.    Dispense:  46 capsule    Refill:  0   Orders Placed This Encounter  Procedures   Flu Vaccine QUAD High Dose(Fluad)     Return in about 4 weeks (around 10/08/2021).  Montel Culver, MD   Primary Care Sports Medicine [place]

## 2021-09-10 NOTE — Patient Instructions (Signed)
-   Dose 1 capsule duloxetine 20 mg nightly x2 weeks - If tolerating well, increase to 2 capsules duloxetine (40 mg total) nightly until follow-up - Discontinue meloxicam and cyclobenzaprine - Continue other medications - Return for follow-up in 4 weeks, contact us for any questions between now and then

## 2021-09-10 NOTE — Assessment & Plan Note (Signed)
Patient has persistent complaints over symmetric paraspinal tightness, numbness throughout the left upper extremity.  She did complete previously written prednisone but did not initiate meloxicam and has noted mild symptom recurrence since prednisone completed.  Examination today reveals limited range of motion primarily with extension, equivocal left Spurling's, moderate tenderness bilaterally throughout the levator scapulae, upper trapezius, rhomboids.  Sensorimotor otherwise intact in bilateral upper extremities.  She does have a history of lumbosacral issues which had responded to injections through spine group, this option was discussed for the cervical spine and she is hesitant to proceed down that pathway.  As such, I did discuss alternative treatment options given her age and comorbid medical risk factors.  We will discontinue meloxicam for CVA risk, hold from cyclobenzaprine due to adverse effect profile, she can continue gabapentin through outside office, we will initiate scheduled duloxetine at 20 mg x 2 weeks then titrate to 40 mg if tolerating well, and coordinate a follow-up in 4 weeks time.  If she tolerates medication well and if it adequately control symptoms, this could be continued as an adjunct to home-based versus formal physical therapy.  Can also consider further titration to not exceed 60 mg daily.

## 2021-10-01 ENCOUNTER — Telehealth: Payer: Self-pay | Admitting: Family Medicine

## 2021-10-01 ENCOUNTER — Other Ambulatory Visit: Payer: Self-pay

## 2021-10-01 DIAGNOSIS — Z1211 Encounter for screening for malignant neoplasm of colon: Secondary | ICD-10-CM

## 2021-10-01 NOTE — Progress Notes (Signed)
Ref gastro placed

## 2021-10-01 NOTE — Telephone Encounter (Signed)
Copied from [place] [id number]. Topic: Referral - Request for Referral >> Oct 01, 2021  9:18 AM Leward Quan A wrote: Has patient seen PCP for this complaint? Yes.   *If NO, is insurance requiring patient see PCP for this issue before PCP can refer them? Referral for which specialty: GI Preferred provider/office: none chosen last Dr have retired  Reason for referral: Colonoscopy

## 2021-10-03 ENCOUNTER — Other Ambulatory Visit: Payer: Self-pay | Admitting: Family Medicine

## 2021-10-03 DIAGNOSIS — M5412 Radiculopathy, cervical region: Secondary | ICD-10-CM

## 2021-10-04 NOTE — Telephone Encounter (Signed)
Requested medications are due for refill today, a little early request and Dr. Zigmund Daniel wants to see prior to ordering more  Requested medications are on the active medication list yes  Last refill 09/10/21  Last visit 09/10/21  Future visit scheduled 10/08/21  Notes to clinic Dr. Zigmund Daniel request return in 4 weeks (10/08/21) prior to ordering more.  Requested Prescriptions  Pending Prescriptions Disp Refills   DULoxetine (CYMBALTA) 20 MG capsule [Pharmacy Med Name: DULoxetine HCl 20 MG Oral Capsule Delayed Release Particles] 46 capsule 0    Sig: TAKE 1 CAPSULE BY MOUTH ONCE DAILY IN THE EVENING FOR 14 DAYS AND THEN 2 ONCE DAILY IN THE EVENING FOR 16 DAYS     Psychiatry: Antidepressants - SNRI Passed - 10/03/2021  7:23 PM      Passed - Last BP in normal range    BP Readings from Last 1 Encounters:  09/10/21 132/72          Passed - Valid encounter within last 6 months    Recent Outpatient Visits           3 weeks ago Cervical radiculopathy   [facility] Montel Culver, MD   1 month ago Impingement syndrome of left shoulder   [facility] Montel Culver, MD   1 month ago DDD (degenerative disc disease), cervical   [facility] Juline Patch, MD   2 months ago Type 2 diabetes mellitus without complication, without long-term current use of insulin ([place])   [facility] Juline Patch, MD   4 months ago Bilateral impacted cerumen   [facility] Juline Patch, MD       Future Appointments             In 4 days Zigmund Daniel, Earley Abide, MD [facility], [place]   In 1 month Juline Patch, MD [facility], [facility]

## 2021-10-08 ENCOUNTER — Ambulatory Visit
Admission: RE | Admit: 2021-10-08 | Discharge: 2021-10-08 | Disposition: A | Discharge status: Home / Self Care | Payer: Medicare PPO | Attending: Family Medicine | Admitting: Family Medicine

## 2021-10-08 ENCOUNTER — Encounter: Payer: Self-pay | Admitting: Family Medicine

## 2021-10-08 ENCOUNTER — Ambulatory Visit: Payer: Medicare PPO | Admitting: Family Medicine

## 2021-10-08 ENCOUNTER — Ambulatory Visit
Admission: RE | Admit: 2021-10-08 | Discharge: 2021-10-08 | Disposition: A | Discharge status: Home / Self Care | Payer: Medicare PPO | Source: Ambulatory Visit | Attending: Family Medicine | Admitting: Family Medicine

## 2021-10-08 ENCOUNTER — Other Ambulatory Visit: Payer: Self-pay

## 2021-10-08 VITALS — BP 124/68 | HR 84 | Ht 63.0 in | Wt 182.0 lb

## 2021-10-08 DIAGNOSIS — M5412 Radiculopathy, cervical region: Secondary | ICD-10-CM

## 2021-10-08 DIAGNOSIS — M5030 Other cervical disc degeneration, unspecified cervical region: Secondary | ICD-10-CM | POA: Insufficient documentation

## 2021-10-08 DIAGNOSIS — M7052 Other bursitis of knee, left knee: Secondary | ICD-10-CM | POA: Insufficient documentation

## 2021-10-08 DIAGNOSIS — M25562 Pain in left knee: Secondary | ICD-10-CM | POA: Diagnosis not present

## 2021-10-08 MED ORDER — DULOXETINE HCL 60 MG PO CPEP: 60.0000 mg | ORAL_CAPSULE | Freq: Every day | ORAL | 3 refills | Status: DC

## 2021-10-08 MED ORDER — GABAPENTIN 300 MG PO CAPS: 300.0000 mg | ORAL_CAPSULE | Freq: Two times a day (BID) | ORAL | 2 refills | Status: DC

## 2021-10-08 NOTE — Assessment & Plan Note (Signed)
Patient reported subtle improvement following duloxetine 40 mg dosing, though without resolution, this is still symptomatic.  She tolerated medication well without issues.  To further control her symptomatology, I have advised titration to 60 mg daily duloxetine, initiation of home-based exercises, and increase of her gabapentin 300 mg nightly to twice daily.  We will coordinate a follow-up in 4 weeks for reevaluation.  If suboptimal response noted, further titration of medication to be reviewed, alternatively advanced imaging and referral to pain and spine group to be considered.

## 2021-10-08 NOTE — Progress Notes (Signed)
Primary Care / Sports Medicine Office Visit  Patient Information:  Patient ID: Lindsey George, female DOB: 05/07/1951 Age: 70 y.o. MRN: [id number]   Lindsey George is a pleasant 70 y.o. female presenting with the following:  Chief Complaint  Patient presents with   Cervical radiculopathy   Impingement syndrome of left shoulder    Radiating down left arm; experiencing numbness and tingling; tolerating Cymbalta 40 mg well, but still having pain; 5/10 pain    Review of Systems pertinent details above   Patient Active Problem List   Diagnosis Date Noted   Pes anserinus bursitis of left knee 10/08/2021   DDD (degenerative disc disease), cervical 10/08/2021   Impingement syndrome of left shoulder 08/18/2021   Hand pain, left 06/26/2021   Loss of memory 06/26/2021   Tremor 06/26/2021   Lumbar stenosis with neurogenic claudication 08/27/2020   Chronic bilateral low back pain with bilateral sciatica 08/27/2020   Essential hypertension 03/06/2020   Cervical radiculopathy 03/06/2020   Asymptomatic varicose veins of both lower extremities 03/06/2020   Morbid obesity ([place]) 03/29/2019   Allergic rhinitis 01/19/2018   Hx of adenomatous colonic polyps 01/14/2018   Reactive airway disease without complication 78/58/8502   Back ache 05/22/2015   Alimentary obesity 05/22/2015   Type 2 diabetes mellitus without complication, without long-term current use of insulin ([place]) 04/12/2014   Hypercholesterolemia 04/12/2014   OP (osteoporosis) 04/12/2014   Past Medical History:  Diagnosis Date   Asthma    Cancer ([place])    skin ca   Chronic back pain    Diabetes mellitus without complication ([place])    Hyperlipidemia    Hypertension    Obesity    Osteoporosis    Outpatient Encounter Medications as of 10/08/2021  Medication Sig   albuterol (PROVENTIL HFA;VENTOLIN HFA) 108 (90 Base) MCG/ACT inhaler Inhale 2 puffs into the lungs every 6 (six) hours as needed for wheezing or shortness  of breath.   albuterol (PROVENTIL) (2.5 MG/3ML) 0.083% nebulizer solution Take 3 mLs (2.5 mg total) by nebulization every 6 (six) hours as needed for wheezing or shortness of breath.   alendronate (FOSAMAX) 70 MG tablet TAKE ONE TABLET ONNCE A WEEK. TAKE WITH A FULL GLASS OF WATER ON AN EMPTY STOMACH.   aspirin 81 MG tablet Take 81 mg by mouth daily.   calcium-vitamin D (OSCAL WITH D) 500-200 MG-UNIT tablet Take 1 tablet by mouth daily with breakfast.   Cholecalciferol (VITAMIN D3 PO) Take 1 capsule by mouth daily.   DULoxetine (CYMBALTA) 60 MG capsule Take 1 capsule (60 mg total) by mouth daily.   EPIPEN 2-PAK 0.3 MG/0.3ML SOAJ injection Inject 0.3 mg into the muscle as needed.   glipiZIDE (GLIPIZIDE XL) 5 MG 24 hr tablet Take 1 tablet (5 mg total) by mouth daily.   loratadine (CLARITIN) 10 MG tablet Take 10 mg by mouth daily. otc   losartan (COZAAR) 25 MG tablet Take 1 tablet (25 mg total) by mouth daily.   metFORMIN (GLUCOPHAGE) 500 MG tablet TAKE ONE TABLET BY MOUTH TWICE DAILY   Multiple Vitamin (MULTIVITAMIN) capsule Take 1 capsule by mouth daily.   ONETOUCH ULTRA test strip USE 1 STRIP TO CHECK GLUCOSE ONCE DAILY   pioglitazone (ACTOS) 15 MG tablet Take 1 tablet (15 mg total) by mouth daily.   simvastatin (ZOCOR) 40 MG tablet Take 1 tablet by mouth once daily   [DISCONTINUED] DULoxetine (CYMBALTA) 20 MG capsule Take 1 capsule (20 mg total) by mouth every evening  for 14 days, THEN 2 capsules (40 mg total) every evening for 16 days.   [DISCONTINUED] gabapentin (NEURONTIN) 300 MG capsule Take 1 capsule by mouth 2 (two) times daily.   gabapentin (NEURONTIN) 300 MG capsule Take 1 capsule (300 mg total) by mouth 2 (two) times daily.   Facility-Administered Encounter Medications as of 10/08/2021  Medication   ipratropium-albuterol (DUONEB) 0.5-2.5 (3) MG/3ML nebulizer solution 3 mL   Past Surgical History:  Procedure Laterality Date   ABDOMINAL HYSTERECTOMY     BREAST BIOPSY Right 06/22/11    bx/clip-neg   BREAST CYST ASPIRATION Left    neg   COLONOSCOPY  2014   cleared for 5 yrs- [place] doc   COLONOSCOPY WITH PROPOFOL N/A 03/25/2018   Procedure: COLONOSCOPY WITH PROPOFOL;  Surgeon: Manya Silvas, MD;  [facility];  Service: Endoscopy;  Laterality: N/A;   PARTIAL HYSTERECTOMY     SPLENECTOMY      Vitals:   10/08/21 0956  BP: 124/68  Pulse: 84  SpO2: 97%   Vitals:   10/08/21 0956  Weight: 182 lb (82.6 kg)  Height: 5\' 3"  (1.6 m)   Body mass index is 32.24 kg/m.  No results found.   Independent interpretation of notes and tests performed by another provider:   None  Procedures performed:   None  Pertinent History, Exam, Impression, and Recommendations:   Cervical radiculopathy Patient reported subtle improvement following duloxetine 40 mg dosing, though without resolution, this is still symptomatic.  She tolerated medication well without issues.  To further control her symptomatology, I have advised titration to 60 mg daily duloxetine, initiation of home-based exercises, and increase of her gabapentin 300 mg nightly to twice daily.  We will coordinate a follow-up in 4 weeks for reevaluation.  If suboptimal response noted, further titration of medication to be reviewed, alternatively advanced imaging and referral to pain and spine group to be considered.  DDD (degenerative disc disease), cervical See additional assessment(s) for plan details.  Pes anserinus bursitis of left knee Acute on chronic left anteromedial knee pain with mild radiation proximal.  This in the setting of narrowing lumbar disease.  Physical examination reveals focal tenderness at knee pes anserine bursa, hamstring tendons.  I have advised dedicated plain films of the left knee, initiation of OTC diclofenac 1%, ice regimen, and home exercises.  We will follow-up on this issue in 4 weeks time.  If suboptimal spots noted, local ultrasound-guided injection to be considered among  modification of pharmacotherapy.   Orders & Medications Meds ordered this encounter  Medications   DULoxetine (CYMBALTA) 60 MG capsule    Sig: Take 1 capsule (60 mg total) by mouth daily.    Dispense:  30 capsule    Refill:  3   gabapentin (NEURONTIN) 300 MG capsule    Sig: Take 1 capsule (300 mg total) by mouth 2 (two) times daily.    Dispense:  60 capsule    Refill:  2   Orders Placed This Encounter  Procedures   DG Knee Complete 4 Views Left     Return in about 4 weeks (around 11/05/2021).     Montel Culver, MD   Primary Care Sports Medicine [place]

## 2021-10-08 NOTE — Patient Instructions (Signed)
-   Obtain knee x-rays - Take new dose of duloxetine 60 mg daily (Rx sent to pharmacy) - Increase gabapentin 300 mg to twice daily (evening and morning) - Start topical anti-inflammatory Voltaren gel (diclofenac 1%) 2-4 times per day to the left knee area of pain - Use ice at 20-minute intervals over a towel for additional local pain control - Start exercises for the neck and knee - Return for follow-up in 4 weeks, contact for questions between now and then

## 2021-10-08 NOTE — Assessment & Plan Note (Signed)
Acute on chronic left anteromedial knee pain with mild radiation proximal.  This in the setting of narrowing lumbar disease.  Physical examination reveals focal tenderness at knee pes anserine bursa, hamstring tendons.  I have advised dedicated plain films of the left knee, initiation of OTC diclofenac 1%, ice regimen, and home exercises.  We will follow-up on this issue in 4 weeks time.  If suboptimal spots noted, local ultrasound-guided injection to be considered among modification of pharmacotherapy.

## 2021-10-08 NOTE — Assessment & Plan Note (Signed)
See additional assessment(s) for plan details. 

## 2021-10-09 ENCOUNTER — Other Ambulatory Visit: Payer: Self-pay

## 2021-10-09 ENCOUNTER — Ambulatory Visit: Payer: Medicare PPO | Attending: Internal Medicine

## 2021-10-09 DIAGNOSIS — Z23 Encounter for immunization: Secondary | ICD-10-CM

## 2021-10-09 MED ORDER — PFIZER COVID-19 VAC BIVALENT 30 MCG/0.3ML IM SUSP
INTRAMUSCULAR | 0 refills | Status: DC
  Filled 2021-10-09: qty 0.3, 1d supply, fill #0

## 2021-10-09 NOTE — Progress Notes (Signed)
   Covid-19 Vaccination Clinic  Name:  [name]    MRN: [id number] DOB: 1951-03-17  10/09/2021  Lindsey George was observed post Covid-19 immunization for 15 minutes without incident. She was provided with Vaccine Information Sheet and instruction to access the V-Safe system.   Lindsey George was instructed to call 911 with any severe reactions post vaccine: Difficulty breathing  Swelling of face and throat  A fast heartbeat  A bad rash all over body  Dizziness and weakness   Immunizations Administered     Name Date Dose VIS Date Route   Pfizer Covid-19 Vaccine Bivalent Booster 10/09/2021 10:52 AM 0.3 mL 07/30/2021 Intramuscular   Manufacturer: [place]   Lot: [id number]   [place]: [phone]

## 2021-10-10 ENCOUNTER — Other Ambulatory Visit: Payer: Self-pay | Admitting: Family Medicine

## 2021-10-10 DIAGNOSIS — M810 Age-related osteoporosis without current pathological fracture: Secondary | ICD-10-CM

## 2021-10-11 NOTE — Telephone Encounter (Signed)
Requested medications are due for refill today yes  Requested medications are on the active medication list yes  Last refill 07/14/21  Last visit 07/21/21  Future visit scheduled 11/05/21  Notes to clinic failed protocol of no vitamin d level within 360 days, please assess.  Requested Prescriptions  Pending Prescriptions Disp Refills   alendronate (FOSAMAX) 70 MG tablet [Pharmacy Med Name: Alendronate Sodium 70 MG Oral Tablet] 12 tablet 0    Sig: TAKE 1 TABLET BY MOUTH ONCE A WEEK TAKE  WITH  A  FULL  GLASS  OF  WATER  ON  AN  EMPTY  STOMACH     Endocrinology:  Bisphosphonates Failed - 10/10/2021  7:46 PM      Failed - Ca in normal range and within 360 days    Calcium  Date Value Ref Range Status  07/21/2021 10.4 (H) 8.7 - 10.3 mg/dL Final   Calcium, Total  Date Value Ref Range Status  11/26/2013 9.4 8.5 - 10.1 mg/dL Final          Failed - Vitamin D in normal range and within 360 days    No results found for: [id number], [id number], [id number], [place], [place], 25OHVITD3, 25OHVITD2, 25OHVITD1, 25OHVITD2, 25OHVITD3, VD25OH        Passed - Valid encounter within last 12 months    Recent Outpatient Visits           3 days ago Cervical radiculopathy   [facility] Montel Culver, MD   1 month ago Cervical radiculopathy   [facility] Montel Culver, MD   1 month ago Impingement syndrome of left shoulder   [facility] Montel Culver, MD   2 months ago DDD (degenerative disc disease), cervical   [facility] Juline Patch, MD   2 months ago Type 2 diabetes mellitus without complication, without long-term current use of insulin [facility])   [facility] Juline Patch, MD       Future Appointments             In 3 weeks Zigmund Daniel, Earley Abide, MD [facility], [place]   In 1 month Juline Patch, MD [facility], [facility]

## 2021-11-05 ENCOUNTER — Ambulatory Visit: Payer: Medicare PPO | Admitting: Family Medicine

## 2021-11-13 ENCOUNTER — Encounter: Payer: Self-pay | Admitting: Family Medicine

## 2021-11-13 ENCOUNTER — Ambulatory Visit: Payer: Medicare PPO | Admitting: Family Medicine

## 2021-11-13 ENCOUNTER — Other Ambulatory Visit: Payer: Self-pay

## 2021-11-13 VITALS — BP 138/80 | Ht 63.0 in | Wt 182.0 lb

## 2021-11-13 DIAGNOSIS — E7800 Pure hypercholesterolemia, unspecified: Secondary | ICD-10-CM | POA: Diagnosis not present

## 2021-11-13 DIAGNOSIS — E119 Type 2 diabetes mellitus without complications: Secondary | ICD-10-CM

## 2021-11-13 DIAGNOSIS — I10 Essential (primary) hypertension: Secondary | ICD-10-CM

## 2021-11-13 MED ORDER — PIOGLITAZONE HCL 15 MG PO TABS: 15.0000 mg | ORAL_TABLET | Freq: Every day | ORAL | 1 refills | Status: DC

## 2021-11-13 MED ORDER — SIMVASTATIN 40 MG PO TABS: ORAL_TABLET | ORAL | 1 refills | Status: DC

## 2021-11-13 MED ORDER — LOSARTAN POTASSIUM 25 MG PO TABS: 25.0000 mg | ORAL_TABLET | Freq: Every day | ORAL | 1 refills | Status: DC

## 2021-11-13 MED ORDER — GLIPIZIDE ER 5 MG PO TB24: 5.0000 mg | ORAL_TABLET | Freq: Every day | ORAL | 1 refills | Status: DC

## 2021-11-13 MED ORDER — METFORMIN HCL 500 MG PO TABS: ORAL_TABLET | ORAL | 1 refills | Status: DC

## 2021-11-13 NOTE — Progress Notes (Signed)
Date:  11/13/2021   Name:  Lindsey George   DOB:  10/05/1951   MRN:  [id number]   Chief Complaint: Diabetes and Hypertension  Diabetes She presents for her follow-up diabetic visit. She has type 2 diabetes mellitus. Her disease course has been stable. There are no hypoglycemic associated symptoms. Pertinent negatives for hypoglycemia include no dizziness, headaches or nervousness/anxiousness. There are no diabetic associated symptoms. Pertinent negatives for diabetes include no blurred vision, no chest pain, no fatigue, no foot paresthesias, no foot ulcerations, no polydipsia, no polyphagia, no polyuria, no visual change, no weakness and no weight loss. There are no hypoglycemic complications. Symptoms are stable. There are no diabetic complications. Risk factors for coronary artery disease include diabetes mellitus, dyslipidemia and hypertension. Current diabetic treatment includes oral agent (triple therapy). She is compliant with treatment all of the time. Her weight is stable. She is following a generally healthy diet. Meal planning includes avoidance of concentrated sweets and carbohydrate counting. She participates in exercise intermittently. Her home blood glucose trend is decreasing rapidly. Her breakfast blood glucose is taken between 8-9 am. Her breakfast blood glucose range is generally 130-140 mg/dl. An ACE inhibitor/angiotensin II receptor blocker is being taken.  Hypertension This is a chronic problem. The current episode started more than 1 year ago. The problem has been gradually improving since onset. The problem is controlled. Pertinent negatives include no blurred vision, chest pain, headaches, malaise/fatigue, neck pain, palpitations, PND or shortness of breath. There are no associated agents to hypertension. Risk factors for coronary artery disease include diabetes mellitus and dyslipidemia. Past treatments include angiotensin blockers. The current treatment provides moderate  improvement. There are no compliance problems.  There is no history of chronic renal disease.  Hyperlipidemia This is a chronic problem. The current episode started more than 1 year ago. The problem is controlled. Recent lipid tests were reviewed and are normal. Exacerbating diseases include diabetes. She has no history of chronic renal disease, hypothyroidism, liver disease, obesity or nephrotic syndrome. Pertinent negatives include no chest pain, myalgias or shortness of breath. Current antihyperlipidemic treatment includes statins. The current treatment provides moderate improvement of lipids. There are no compliance problems.  Risk factors for coronary artery disease include dyslipidemia.   Lab Results  Component Value Date   NA 139 07/21/2021   K 4.6 07/21/2021   CO2 25 07/21/2021   GLUCOSE 133 (H) 07/21/2021   BUN 18 07/21/2021   CREATININE 0.54 (L) 07/21/2021   CALCIUM 10.4 (H) 07/21/2021   EGFR 98 07/21/2021   GFRNONAA 92 11/11/2020   Lab Results  Component Value Date   CHOL 150 07/21/2021   HDL 51 07/21/2021   LDLCALC 77 07/21/2021   TRIG 121 07/21/2021   CHOLHDL 2.9 09/19/2018   No results found for: TSH Lab Results  Component Value Date   HGBA1C 6.6 (H) 07/21/2021   Lab Results  Component Value Date   WBC 12.1 (H) 08/11/2018   HGB 12.9 08/11/2018   HCT 40.8 08/11/2018   MCV 80.5 08/11/2018   PLT 394 08/11/2018   Lab Results  Component Value Date   ALT 18 07/21/2021   AST 14 07/21/2021   ALKPHOS 51 07/21/2021   BILITOT 0.3 07/21/2021   No results found for: 25OHVITD2, 25OHVITD3, VD25OH   Review of Systems  Constitutional:  Negative for chills, fatigue, fever, malaise/fatigue and weight loss.  HENT:  Negative for drooling, ear discharge, ear pain and sore throat.   Eyes:  Negative for blurred  vision.  Respiratory:  Negative for cough, shortness of breath and wheezing.   Cardiovascular:  Negative for chest pain, palpitations, leg swelling and PND.   Gastrointestinal:  Negative for abdominal pain, blood in stool, constipation, diarrhea and nausea.  Endocrine: Negative for polydipsia, polyphagia and polyuria.  Genitourinary:  Negative for dysuria, frequency, hematuria and urgency.  Musculoskeletal:  Negative for back pain, myalgias and neck pain.  Skin:  Negative for rash.  Allergic/Immunologic: Negative for environmental allergies.  Neurological:  Negative for dizziness, weakness and headaches.  Hematological:  Does not bruise/bleed easily.  Psychiatric/Behavioral:  Negative for suicidal ideas. The patient is not nervous/anxious.    Patient Active Problem List   Diagnosis Date Noted   Pes anserinus bursitis of left knee 10/08/2021   DDD (degenerative disc disease), cervical 10/08/2021   Impingement syndrome of left shoulder 08/18/2021   Hand pain, left 06/26/2021   Loss of memory 06/26/2021   Tremor 06/26/2021   Lumbar stenosis with neurogenic claudication 08/27/2020   Chronic bilateral low back pain with bilateral sciatica 08/27/2020   Essential hypertension 03/06/2020   Cervical radiculopathy 03/06/2020   Asymptomatic varicose veins of both lower extremities 03/06/2020   Morbid obesity ([place]) 03/29/2019   Allergic rhinitis 01/19/2018   Hx of adenomatous colonic polyps 01/14/2018   Reactive airway disease without complication 86/16/8372   Back ache 05/22/2015   Alimentary obesity 05/22/2015   Type 2 diabetes mellitus without complication, without long-term current use of insulin ([place]) 04/12/2014   Hypercholesterolemia 04/12/2014   OP (osteoporosis) 04/12/2014    Allergies  Allergen Reactions   Codeine Other (See Comments)    Abdominal pain    Past Surgical History:  Procedure Laterality Date   ABDOMINAL HYSTERECTOMY     BREAST BIOPSY Right 06/22/11   bx/clip-neg   BREAST CYST ASPIRATION Left    neg   COLONOSCOPY  2014   cleared for 5 yrs- [place] doc   COLONOSCOPY WITH PROPOFOL N/A 03/25/2018   Procedure: COLONOSCOPY  WITH PROPOFOL;  Surgeon: Manya Silvas, MD;  Location: [facility] ENDOSCOPY;  Service: Endoscopy;  Laterality: N/A;   PARTIAL HYSTERECTOMY     SPLENECTOMY      Social History   Tobacco Use   Smoking status: Never   Smokeless tobacco: Never   Tobacco comments:    smoking cessation materials not required  Vaping Use   Vaping Use: Never used  Substance Use Topics   Alcohol use: Not Currently   Drug use: Never     Medication list has been reviewed and updated.  Current Meds  Medication Sig   albuterol (PROVENTIL HFA;VENTOLIN HFA) 108 (90 Base) MCG/ACT inhaler Inhale 2 puffs into the lungs every 6 (six) hours as needed for wheezing or shortness of breath.   albuterol (PROVENTIL) (2.5 MG/3ML) 0.083% nebulizer solution Take 3 mLs (2.5 mg total) by nebulization every 6 (six) hours as needed for wheezing or shortness of breath.   alendronate (FOSAMAX) 70 MG tablet TAKE 1 TABLET BY MOUTH ONCE A WEEK TAKE  WITH  A  FULL  GLASS  OF  WATER  ON  AN  EMPTY  STOMACH   aspirin 81 MG tablet Take 81 mg by mouth daily.   calcium-vitamin D (OSCAL WITH D) 500-200 MG-UNIT tablet Take 1 tablet by mouth daily with breakfast.   Cholecalciferol (VITAMIN D3 PO) Take 1 capsule by mouth daily.   COVID-19 mRNA bivalent vaccine, Pfizer, (PFIZER COVID-19 VAC BIVALENT) injection Inject into the muscle.   DULoxetine (CYMBALTA) 60 MG  capsule Take 1 capsule (60 mg total) by mouth daily.   EPIPEN 2-PAK 0.3 MG/0.3ML SOAJ injection Inject 0.3 mg into the muscle as needed.   gabapentin (NEURONTIN) 300 MG capsule Take 1 capsule (300 mg total) by mouth 2 (two) times daily.   glipiZIDE (GLIPIZIDE XL) 5 MG 24 hr tablet Take 1 tablet (5 mg total) by mouth daily.   losartan (COZAAR) 25 MG tablet Take 1 tablet (25 mg total) by mouth daily.   metFORMIN (GLUCOPHAGE) 500 MG tablet TAKE ONE TABLET BY MOUTH TWICE DAILY   Multiple Vitamin (MULTIVITAMIN) capsule Take 1 capsule by mouth daily.   ONETOUCH ULTRA test strip USE 1 STRIP TO  CHECK GLUCOSE ONCE DAILY   pioglitazone (ACTOS) 15 MG tablet Take 1 tablet (15 mg total) by mouth daily.   simvastatin (ZOCOR) 40 MG tablet Take 1 tablet by mouth once daily   Current Facility-Administered Medications for the 11/13/21 encounter (Office Visit) with Juline Patch, MD  Medication   ipratropium-albuterol (DUONEB) 0.5-2.5 (3) MG/3ML nebulizer solution 3 mL    PHQ 2/9 Scores 11/13/2021 10/08/2021 08/18/2021 07/21/2021  PHQ - 2 Score 0 0 0 0  PHQ- 9 Score 0 0 1 0    GAD 7 : Generalized Anxiety Score 11/13/2021 10/08/2021 08/18/2021 07/21/2021  Nervous, Anxious, on Edge 0 0 0 0  Control/stop worrying 0 0 0 0  Worry too much - different things 0 0 0 0  Trouble relaxing 0 0 0 0  Restless 0 0 0 0  Easily annoyed or irritable 0 0 0 0  Afraid - awful might happen 0 0 0 0  Total GAD 7 Score 0 0 0 0  Anxiety Difficulty Not difficult at all Not difficult at all Not difficult at all -    BP Readings from Last 3 Encounters:  11/13/21 138/80  10/08/21 124/68  09/10/21 132/72    Physical Exam Vitals and nursing note reviewed.  Constitutional:      General: She is not in acute distress.    Appearance: She is not diaphoretic.  HENT:     Head: Normocephalic and atraumatic.     Right Ear: External ear normal.     Left Ear: External ear normal.     Nose: Nose normal.  Eyes:     General:        Right eye: No discharge.        Left eye: No discharge.     Conjunctiva/sclera: Conjunctivae normal.     Pupils: Pupils are equal, round, and reactive to light.  Neck:     Thyroid: No thyromegaly.     Vascular: No JVD.  Cardiovascular:     Rate and Rhythm: Normal rate and regular rhythm.     Heart sounds: Normal heart sounds, S1 normal and S2 normal. No murmur heard. No systolic murmur is present.  No diastolic murmur is present.    No friction rub. No gallop. No S3 or S4 sounds.  Pulmonary:     Effort: Pulmonary effort is normal.     Breath sounds: Normal breath sounds. No  decreased breath sounds, wheezing, rhonchi or rales.  Abdominal:     General: Bowel sounds are normal.     Palpations: Abdomen is soft. There is no mass.     Tenderness: There is no abdominal tenderness. There is no guarding or rebound.  Musculoskeletal:        General: Normal range of motion.     Cervical back: Normal range of  motion and neck supple.  Lymphadenopathy:     Cervical: No cervical adenopathy.  Skin:    General: Skin is warm and dry.     Capillary Refill: Capillary refill takes less than 2 seconds.  Neurological:     Mental Status: She is alert.     Deep Tendon Reflexes: Reflexes are normal and symmetric.    Wt Readings from Last 3 Encounters:  11/13/21 182 lb (82.6 kg)  10/08/21 182 lb (82.6 kg)  09/10/21 187 lb (84.8 kg)    BP 138/80    Ht '5\' 3"'  (1.6 m)    Wt 182 lb (82.6 kg)    BMI 32.24 kg/m   Assessment and Plan:  1. Type 2 diabetes mellitus without complication, without long-term current use of insulin (HCC) Chronic.  Controlled.  Stable.  In fact A1c's have been in the low 6 range for the last several readings and we will discontinue the pioglitazone.  We will recheck A1c and renal function panel and will recheck patient in 4 months - glipiZIDE (GLIPIZIDE XL) 5 MG 24 hr tablet; Take 1 tablet (5 mg total) by mouth daily.  Dispense: 90 tablet; Refill: 1 - metFORMIN (GLUCOPHAGE) 500 MG tablet; TAKE ONE TABLET BY MOUTH TWICE DAILY  Dispense: 180 tablet; Refill: 1 - HgB A1c - Renal Function Panel  2. Essential hypertension Chronic.  Controlled.  Stable.  Blood pressure is excellent at 138/80.  We will continue losartan 25 mg once a day and will check renal function panel for GFR and electrolyte status. - losartan (COZAAR) 25 MG tablet; Take 1 tablet (25 mg total) by mouth daily.  Dispense: 90 tablet; Refill: 1 - Renal Function Panel  3. Hypercholesterolemia Chronic.  Controlled.  Stable.  Continue simvastatin 40 mg once a day. - simvastatin (ZOCOR) 40 MG  tablet; Take 1 tablet by mouth once daily  Dispense: 90 tablet; Refill: 1

## 2021-11-14 LAB — RENAL FUNCTION PANEL
Albumin: 4.9 g/dL — ABNORMAL HIGH (ref 3.7–4.7)
BUN/Creatinine Ratio: 33 — ABNORMAL HIGH (ref 12–28)
BUN: 20 mg/dL (ref 8–27)
CO2: 26 mmol/L (ref 20–29)
Calcium: 10.1 mg/dL (ref 8.7–10.3)
Chloride: 102 mmol/L (ref 96–106)
Creatinine, Ser: 0.61 mg/dL (ref 0.57–1.00)
Glucose: 123 mg/dL — ABNORMAL HIGH (ref 70–99)
Phosphorus: 4.2 mg/dL (ref 3.0–4.3)
Potassium: 4.5 mmol/L (ref 3.5–5.2)
Sodium: 145 mmol/L — ABNORMAL HIGH (ref 134–144)
eGFR: 96 mL/min/{1.73_m2} (ref >59)

## 2021-11-14 LAB — HEMOGLOBIN A1C
Est. average glucose Bld gHb Est-mCnc: 134 mg/dL
Hgb A1c MFr Bld: 6.3 % — ABNORMAL HIGH (ref 4.8–5.6)

## 2021-11-21 ENCOUNTER — Ambulatory Visit: Payer: Medicare PPO | Admitting: Family Medicine

## 2021-11-25 ENCOUNTER — Encounter: Payer: Self-pay | Admitting: Family Medicine

## 2021-11-25 ENCOUNTER — Ambulatory Visit: Payer: Medicare PPO | Admitting: Family Medicine

## 2021-11-25 ENCOUNTER — Other Ambulatory Visit: Payer: Self-pay

## 2021-11-25 DIAGNOSIS — M5412 Radiculopathy, cervical region: Secondary | ICD-10-CM | POA: Diagnosis not present

## 2021-11-25 DIAGNOSIS — M5030 Other cervical disc degeneration, unspecified cervical region: Secondary | ICD-10-CM

## 2021-11-25 MED ORDER — DULOXETINE HCL 30 MG PO CPEP
30.0000 mg | ORAL_CAPSULE | Freq: Every day | ORAL | 0 refills | Status: DC
Start: 2021-11-25 — End: 2021-12-26

## 2021-11-25 NOTE — Patient Instructions (Signed)
-   Decrease your Cymbalta (duloxetine) from 60 mg daily to 30 mg daily (new Rx sent to your pharmacy) - Dose 30 mg Cymbalta for 2 weeks then discontinue - Return for follow-up in 6 weeks, contact us for any questions or concerns between now and then

## 2021-11-25 NOTE — Progress Notes (Signed)
Primary Care / Sports Medicine Office Visit  Patient Information:  Patient ID: Lindsey George, female DOB: 1951-04-15 Age: 70 y.o. MRN: [id number]   Lindsey George is a pleasant 70 y.o. female presenting with the following:  Chief Complaint  Patient presents with   Cervical radiculopathy    Tolerating duloxetine 60 mg well, but experiencing extrapyramidal symptoms along with weight loss; denies pain in office today   Impingement syndrome of left shoulder    Denies pain in office    Patient Active Problem List   Diagnosis Date Noted   Pes anserinus bursitis of left knee 10/08/2021   DDD (degenerative disc disease), cervical 10/08/2021   Impingement syndrome of left shoulder 08/18/2021   Hand pain, left 06/26/2021   Loss of memory 06/26/2021   Tremor 06/26/2021   Lumbar stenosis with neurogenic claudication 08/27/2020   Chronic bilateral low back pain with bilateral sciatica 08/27/2020   Essential hypertension 03/06/2020   Cervical radiculopathy 03/06/2020   Asymptomatic varicose veins of both lower extremities 03/06/2020   Morbid obesity ([place]) 03/29/2019   Allergic rhinitis 01/19/2018   Hx of adenomatous colonic polyps 01/14/2018   Reactive airway disease without complication 46/56/8127   Back ache 05/22/2015   Alimentary obesity 05/22/2015   Type 2 diabetes mellitus without complication, without long-term current use of insulin ([place]) 04/12/2014   Hypercholesterolemia 04/12/2014   OP (osteoporosis) 04/12/2014    Vitals:   11/25/21 1017 11/25/21 1059  BP: (!) 144/82 (!) 148/84  Pulse: 86   SpO2: 97%    Vitals:   11/25/21 1017  Weight: 178 lb (80.7 kg)  Height: 5\' 3"  (1.6 m)   Body mass index is 31.53 kg/m.  No results found.   Independent interpretation of notes and tests performed by another provider:   None  Procedures performed:   None  Pertinent History, Exam, Impression, and Recommendations:   DDD (degenerative disc disease),  cervical This is a chronic stable condition, patient has noted essential for symptom control following recent titration of duloxetine to 60 mg.  That being said, she has noted progression of extrapyramidal symptoms, specifically dystonia, elements of akathisia.  The symptoms primarily involve her hands and legs.  She cannot give an exact timeline for symptom onset as she describes some minor hand/wrist tremor at baseline.  There is limited literature describing extrapyramidal symptoms secondary to duloxetine though given that this has been a relatively recent change that seems to coincide with the symptoms, I have advised either a decreased dose or wean with goal to discontinue.  At this stage, given patient's excellent symptom control, she is opted to fully discontinue this medication.  We will do so in a stepwise manner, initially decreasing to 30 mg, then fully discontinue this after 2 weeks.  I did caution that pain may return but we will monitor this and recheck her symptoms in 6 weeks time.  Cervical radiculopathy This is a chronic condition that is stable following initiation and titration of duloxetine.  She has been tolerating 60 mg dose of duloxetine well with essential symptom resolution.  That being said, she has noted extrapyramidal symptoms. See additional assessment(s) for plan details.   Orders & Medications Meds ordered this encounter  Medications   DULoxetine (CYMBALTA) 30 MG capsule    Sig: Take 1 capsule (30 mg total) by mouth daily.    Dispense:  14 capsule    Refill:  0   No orders of the defined types were placed in  this encounter.    Return in about 6 weeks (around 01/06/2022).     Montel Culver, MD   Primary Care Sports Medicine [place]

## 2021-11-25 NOTE — Assessment & Plan Note (Signed)
This is a chronic stable condition, patient has noted essential for symptom control following recent titration of duloxetine to 60 mg.  That being said, she has noted progression of extrapyramidal symptoms, specifically dystonia, elements of akathisia.  The symptoms primarily involve her hands and legs.  She cannot give an exact timeline for symptom onset as she describes some minor hand/wrist tremor at baseline.  There is limited literature describing extrapyramidal symptoms secondary to duloxetine though given that this has been a relatively recent change that seems to coincide with the symptoms, I have advised either a decreased dose or wean with goal to discontinue.  At this stage, given patient's excellent symptom control, she is opted to fully discontinue this medication.  We will do so in a stepwise manner, initially decreasing to 30 mg, then fully discontinue this after 2 weeks.  I did caution that pain may return but we will monitor this and recheck her symptoms in 6 weeks time.

## 2021-11-25 NOTE — Assessment & Plan Note (Signed)
This is a chronic condition that is stable following initiation and titration of duloxetine.  She has been tolerating 60 mg dose of duloxetine well with essential symptom resolution.  That being said, she has noted extrapyramidal symptoms. See additional assessment(s) for plan details.

## 2021-11-26 DIAGNOSIS — E669 Obesity, unspecified: Secondary | ICD-10-CM | POA: Diagnosis not present

## 2021-11-26 DIAGNOSIS — G8929 Other chronic pain: Secondary | ICD-10-CM | POA: Diagnosis not present

## 2021-11-26 DIAGNOSIS — E785 Hyperlipidemia, unspecified: Secondary | ICD-10-CM | POA: Diagnosis not present

## 2021-11-26 DIAGNOSIS — F32A Depression, unspecified: Secondary | ICD-10-CM | POA: Diagnosis not present

## 2021-11-26 DIAGNOSIS — I10 Essential (primary) hypertension: Secondary | ICD-10-CM | POA: Diagnosis not present

## 2021-11-26 DIAGNOSIS — F4321 Adjustment disorder with depressed mood: Secondary | ICD-10-CM | POA: Diagnosis not present

## 2021-11-26 DIAGNOSIS — E1165 Type 2 diabetes mellitus with hyperglycemia: Secondary | ICD-10-CM | POA: Diagnosis not present

## 2021-11-26 DIAGNOSIS — Z7982 Long term (current) use of aspirin: Secondary | ICD-10-CM | POA: Diagnosis not present

## 2021-11-26 DIAGNOSIS — M4800 Spinal stenosis, site unspecified: Secondary | ICD-10-CM | POA: Diagnosis not present

## 2021-11-28 DIAGNOSIS — E119 Type 2 diabetes mellitus without complications: Secondary | ICD-10-CM | POA: Diagnosis not present

## 2021-11-28 LAB — HM DIABETES EYE EXAM

## 2021-12-09 ENCOUNTER — Telehealth: Payer: Self-pay

## 2021-12-09 NOTE — Telephone Encounter (Signed)
Pt came in concerned about bp and weight. We weighed her and she was 179, last weight was 178 so she is staying the same. BP was 140/80. Pt is having colonoscopy and was worried about these 2 things. She is now reassured and will proceed with appt.

## 2021-12-10 DIAGNOSIS — Z86010 Personal history of colonic polyps: Secondary | ICD-10-CM | POA: Diagnosis not present

## 2021-12-10 DIAGNOSIS — R194 Change in bowel habit: Secondary | ICD-10-CM | POA: Insufficient documentation

## 2021-12-19 ENCOUNTER — Other Ambulatory Visit: Payer: Self-pay | Admitting: Family Medicine

## 2021-12-19 DIAGNOSIS — M810 Age-related osteoporosis without current pathological fracture: Secondary | ICD-10-CM

## 2021-12-20 NOTE — Telephone Encounter (Signed)
Requested Prescriptions  Pending Prescriptions Disp Refills   alendronate (FOSAMAX) 70 MG tablet [Pharmacy Med Name: Alendronate Sodium 70 MG Oral Tablet] 12 tablet 0    Sig: TAKE 1 TABLET BY MOUTH ONCE A WEEK TAKE  WITH  A  FULL  GLASS  OF  WATER  ON  AN  EMPTY  STOMACH     Endocrinology:  Bisphosphonates Failed - 12/19/2021  9:24 PM      Failed - Vitamin D in normal range and within 360 days    No results found for: [id number], [id number], [id number], 25OHVITD3, 25OHVITD2, 25OHVITD3, 25OHVITD2, 25OHVITD1, 25OHVITD2, 25OHVITD3, VD25OH       Passed - Ca in normal range and within 360 days    Calcium  Date Value Ref Range Status  11/13/2021 10.1 8.7 - 10.3 mg/dL Final   Calcium, Total  Date Value Ref Range Status  11/26/2013 9.4 8.5 - 10.1 mg/dL Final         Passed - Valid encounter within last 12 months    Recent Outpatient Visits          3 weeks ago Cervical radiculopathy   [facility] Montel Culver, MD   1 month ago Type 2 diabetes mellitus without complication, without long-term current use of insulin ([place])   [facility] Juline Patch, MD   2 months ago Cervical radiculopathy   [facility] Montel Culver, MD   3 months ago Cervical radiculopathy   [facility] Montel Culver, MD   4 months ago Impingement syndrome of left shoulder   [facility] Montel Culver, MD      Future Appointments            In 2 weeks Zigmund Daniel, Earley Abide, MD [facility], [place]   In 2 months Juline Patch, MD [facility], [facility]

## 2021-12-26 ENCOUNTER — Telehealth: Payer: Self-pay | Admitting: Family Medicine

## 2021-12-26 DIAGNOSIS — M5412 Radiculopathy, cervical region: Secondary | ICD-10-CM

## 2021-12-26 DIAGNOSIS — M5030 Other cervical disc degeneration, unspecified cervical region: Secondary | ICD-10-CM

## 2021-12-26 MED ORDER — DULOXETINE HCL 30 MG PO CPEP: 30.0000 mg | ORAL_CAPSULE | Freq: Every day | ORAL | 0 refills | Status: DC

## 2021-12-26 NOTE — Telephone Encounter (Signed)
Please find out if her symptoms such as hand tremor have resolved since being off of the duloxetine? If they have not, then it may not be the duloxetine causing them.  If they have resolved, she can restart at the 30 mg duloxetine dose and stay at that low dose with the hopes that it controls her pain without causing any additional symptoms.

## 2021-12-26 NOTE — Telephone Encounter (Signed)
Patient notified and verbalized understanding.  Will start duloxetine 30 mg daily since it was relieving pain when taking it.  Will see patient on 01/06/22 for further evaluation.  Tremors have continued despite being off duloxetine.  For your information. No further questions at this time.

## 2021-12-26 NOTE — Telephone Encounter (Signed)
Please advise for medication management. 

## 2021-12-26 NOTE — Telephone Encounter (Signed)
Pt called and stated that she would like a call back from the nurse regarding shoulder pain. She states that it is not getting better. Pt states that she was coming off duloxetine and this is not working for patient. Pain is staring to come back. Pt would like to know what she can do. Please advise.

## 2022-01-06 ENCOUNTER — Encounter: Payer: Self-pay | Admitting: Family Medicine

## 2022-01-06 ENCOUNTER — Ambulatory Visit: Payer: Medicare PPO | Admitting: Family Medicine

## 2022-01-06 ENCOUNTER — Other Ambulatory Visit: Payer: Self-pay

## 2022-01-06 DIAGNOSIS — M5412 Radiculopathy, cervical region: Secondary | ICD-10-CM | POA: Diagnosis not present

## 2022-01-06 DIAGNOSIS — M5030 Other cervical disc degeneration, unspecified cervical region: Secondary | ICD-10-CM | POA: Diagnosis not present

## 2022-01-06 MED ORDER — DULOXETINE HCL 30 MG PO CPEP: ORAL_CAPSULE | ORAL | 0 refills | Status: DC

## 2022-01-06 NOTE — Assessment & Plan Note (Addendum)
Patient had previously been dosing duloxetine but with concern over worsening tremors we had weaned and discontinued this medication.  Despite being off of the medication for quite some time her tremors have persisted and pain has progressively worsened.  After a discussion with her PCP, alternative diagnoses had previous been noted that can account for those symptoms.  We had a discussion about restarting duloxetine given her excellent pain control on this and she is amenable to the same with additional further evaluation by neurology for review of her tremors (to be coordinated by her PCP).  Chronic condition, uncontrolled, Rx management

## 2022-01-06 NOTE — Progress Notes (Signed)
°  ° °  Primary Care / Sports Medicine Office Visit  Patient Information:  Patient ID: Lindsey George, female DOB: 09-04-1951 Age: 71 y.o. MRN: [id number]   Lindsey George is a pleasant 71 y.o. female presenting with the following:  Chief Complaint  Patient presents with   Cervical radiculopathy   Knee Pain    left    Vitals:   01/06/22 0945  BP: 122/78  Pulse: 84  SpO2: 96%   Vitals:   01/06/22 0945  Weight: 181 lb (82.1 kg)  Height: 5\' 3"  (1.6 m)   Body mass index is 32.06 kg/m.  No results found.   Independent interpretation of notes and tests performed by another provider:   None  Procedures performed:   None  Pertinent History, Exam, Impression, and Recommendations:   Cervical radiculopathy Patient had previously been dosing duloxetine but with concern over worsening tremors we had weaned and discontinued this medication.  Despite being off of the medication for quite some time her tremors have persisted and pain has progressively worsened.  After a discussion with her PCP, alternative diagnoses had previous been noted that can account for those symptoms.  We had a discussion about restarting duloxetine given her excellent pain control on this and she is amenable to the same with additional further evaluation by neurology for review of her tremors (to be coordinated by her PCP).  Chronic condition, uncontrolled, Rx management   Orders & Medications Meds ordered this encounter  Medications   DULoxetine (CYMBALTA) 30 MG capsule    Sig: Take 1 capsule (30 mg total) by mouth daily for 30 days, THEN 2 capsules (60 mg total) daily.    Dispense:  90 capsule    Refill:  0   No orders of the defined types were placed in this encounter.    No follow-ups on file.     Montel Culver, MD   Primary Care Sports Medicine [place]

## 2022-01-08 ENCOUNTER — Ambulatory Visit: Payer: Medicare PPO | Admitting: Family Medicine

## 2022-01-08 ENCOUNTER — Other Ambulatory Visit: Payer: Self-pay

## 2022-01-08 ENCOUNTER — Encounter: Payer: Self-pay | Admitting: Family Medicine

## 2022-01-08 VITALS — BP 120/64 | HR 88 | Temp 98.6°F | Ht 63.0 in | Wt 184.0 lb

## 2022-01-08 DIAGNOSIS — T17908S Unspecified foreign body in respiratory tract, part unspecified causing other injury, sequela: Secondary | ICD-10-CM

## 2022-01-08 DIAGNOSIS — J0100 Acute maxillary sinusitis, unspecified: Secondary | ICD-10-CM | POA: Diagnosis not present

## 2022-01-08 DIAGNOSIS — J4520 Mild intermittent asthma, uncomplicated: Secondary | ICD-10-CM

## 2022-01-08 MED ORDER — AMOXICILLIN-POT CLAVULANATE 875-125 MG PO TABS: 1.0000 | ORAL_TABLET | Freq: Two times a day (BID) | ORAL | 0 refills | Status: DC

## 2022-01-08 MED ORDER — ALBUTEROL SULFATE HFA 108 (90 BASE) MCG/ACT IN AERS: 2.0000 | INHALATION_SPRAY | Freq: Four times a day (QID) | RESPIRATORY_TRACT | 2 refills | Status: DC | PRN

## 2022-01-08 NOTE — Progress Notes (Signed)
Date:  01/08/2022   Name:  Lindsey George   DOB:  04/21/1951   MRN:  [id number]   Chief Complaint: Cough (Clear production, "stopped up"- taking mucinex and alkaseltzer plus- helping some. Gets worse at night)  Cough This is a new (associated with aspiration episode) problem. The current episode started in the past 7 days. The problem has been waxing and waning. The cough is Productive of purulent sputum. Associated symptoms include nasal congestion, postnasal drip, rhinorrhea, a sore throat and wheezing. Pertinent negatives include no chest pain, chills, ear pain, fever, headaches, myalgias, rash or shortness of breath. The symptoms are aggravated by lying down. She has tried a beta-agonist inhaler (in the past) for the symptoms. The treatment provided mild relief. There is no history of environmental allergies.  Sinusitis This is a chronic problem. The current episode started more than 1 year ago. The problem has been waxing and waning since onset. There has been no fever. The pain is moderate. Associated symptoms include coughing and a sore throat. Pertinent negatives include no chills, congestion, diaphoresis, ear pain, headaches, neck pain, shortness of breath or sinus pressure.   Lab Results  Component Value Date   NA 145 (H) 11/13/2021   K 4.5 11/13/2021   CO2 26 11/13/2021   GLUCOSE 123 (H) 11/13/2021   BUN 20 11/13/2021   CREATININE 0.61 11/13/2021   CALCIUM 10.1 11/13/2021   EGFR 96 11/13/2021   GFRNONAA 92 11/11/2020   Lab Results  Component Value Date   CHOL 150 07/21/2021   HDL 51 07/21/2021   LDLCALC 77 07/21/2021   TRIG 121 07/21/2021   CHOLHDL 2.9 09/19/2018   No results found for: TSH Lab Results  Component Value Date   HGBA1C 6.3 (H) 11/13/2021   Lab Results  Component Value Date   WBC 12.1 (H) 08/11/2018   HGB 12.9 08/11/2018   HCT 40.8 08/11/2018   MCV 80.5 08/11/2018   PLT 394 08/11/2018   Lab Results  Component Value Date   ALT 18 07/21/2021    AST 14 07/21/2021   ALKPHOS 51 07/21/2021   BILITOT 0.3 07/21/2021   No results found for: 25OHVITD2, 25OHVITD3, VD25OH   Review of Systems  Constitutional:  Negative for chills, diaphoresis and fever.  HENT:  Positive for postnasal drip, rhinorrhea and sore throat. Negative for congestion, drooling, ear discharge, ear pain and sinus pressure.   Respiratory:  Positive for cough and wheezing. Negative for shortness of breath.   Cardiovascular:  Negative for chest pain, palpitations and leg swelling.  Gastrointestinal:  Negative for abdominal pain, blood in stool, constipation, diarrhea and nausea.  Endocrine: Negative for polydipsia.  Genitourinary:  Negative for dysuria, frequency, hematuria and urgency.  Musculoskeletal:  Negative for back pain, myalgias and neck pain.  Skin:  Negative for rash.  Allergic/Immunologic: Negative for environmental allergies.  Neurological:  Negative for dizziness and headaches.  Hematological:  Does not bruise/bleed easily.  Psychiatric/Behavioral:  Negative for suicidal ideas. The patient is not nervous/anxious.    Patient Active Problem List   Diagnosis Date Noted   Change in bowel habits 12/10/2021   Pes anserinus bursitis of left knee 10/08/2021   DDD (degenerative disc disease), cervical 10/08/2021   Impingement syndrome of left shoulder 08/18/2021   Hand pain, left 06/26/2021   Loss of memory 06/26/2021   Tremor 06/26/2021   Lumbar stenosis with neurogenic claudication 08/27/2020   Chronic bilateral low back pain with bilateral sciatica 08/27/2020   Essential hypertension  03/06/2020   Cervical radiculopathy 03/06/2020   Asymptomatic varicose veins of both lower extremities 03/06/2020   Morbid obesity ([place]) 03/29/2019   Allergic rhinitis 01/19/2018   Hx of adenomatous colonic polyps 01/14/2018   Reactive airway disease without complication 44/11/270   Back ache 05/22/2015   Alimentary obesity 05/22/2015   Type 2 diabetes mellitus  without complication, without long-term current use of insulin ([place]) 04/12/2014   Hypercholesterolemia 04/12/2014   OP (osteoporosis) 04/12/2014    Allergies  Allergen Reactions   Codeine Other (See Comments)    Abdominal pain    Past Surgical History:  Procedure Laterality Date   ABDOMINAL HYSTERECTOMY     BREAST BIOPSY Right 06/22/11   bx/clip-neg   BREAST CYST ASPIRATION Left    neg   COLONOSCOPY  2014   cleared for 5 yrs- [facility]   COLONOSCOPY WITH PROPOFOL N/A 03/25/2018   Procedure: COLONOSCOPY WITH PROPOFOL;  Surgeon: Manya Silvas, MD;  Location: [facility] ENDOSCOPY;  Service: Endoscopy;  Laterality: N/A;   PARTIAL HYSTERECTOMY     SPLENECTOMY      Social History   Tobacco Use   Smoking status: Never   Smokeless tobacco: Never   Tobacco comments:    smoking cessation materials not required  Vaping Use   Vaping Use: Never used  Substance Use Topics   Alcohol use: Not Currently   Drug use: Never     Medication list has been reviewed and updated.  Current Meds  Medication Sig   albuterol (PROVENTIL HFA;VENTOLIN HFA) 108 (90 Base) MCG/ACT inhaler Inhale 2 puffs into the lungs every 6 (six) hours as needed for wheezing or shortness of breath.   albuterol (PROVENTIL) (2.5 MG/3ML) 0.083% nebulizer solution Take 3 mLs (2.5 mg total) by nebulization every 6 (six) hours as needed for wheezing or shortness of breath.   alendronate (FOSAMAX) 70 MG tablet TAKE 1 TABLET BY MOUTH ONCE A WEEK TAKE  WITH  A  FULL  GLASS  OF  WATER  ON  AN  EMPTY  STOMACH   aspirin 81 MG tablet Take 81 mg by mouth daily.   calcium-vitamin D (OSCAL WITH D) 500-200 MG-UNIT tablet Take 1 tablet by mouth daily with breakfast.   Cholecalciferol (VITAMIN D3 PO) Take 1 capsule by mouth daily.   DULoxetine (CYMBALTA) 30 MG capsule Take 1 capsule (30 mg total) by mouth daily for 30 days, THEN 2 capsules (60 mg total) daily.   EPIPEN 2-PAK 0.3 MG/0.3ML SOAJ injection Inject 0.3 mg into the muscle as  needed.   gabapentin (NEURONTIN) 300 MG capsule Take 1 capsule (300 mg total) by mouth 2 (two) times daily.   glipiZIDE (GLIPIZIDE XL) 5 MG 24 hr tablet Take 1 tablet (5 mg total) by mouth daily.   losartan (COZAAR) 25 MG tablet Take 1 tablet (25 mg total) by mouth daily.   metFORMIN (GLUCOPHAGE) 500 MG tablet TAKE ONE TABLET BY MOUTH TWICE DAILY   Multiple Vitamin (MULTIVITAMIN) capsule Take 1 capsule by mouth daily.   ONETOUCH ULTRA test strip USE 1 STRIP TO CHECK GLUCOSE ONCE DAILY   simvastatin (ZOCOR) 40 MG tablet Take 1 tablet by mouth once daily   Current Facility-Administered Medications for the 01/08/22 encounter (Office Visit) with Juline Patch, MD  Medication   ipratropium-albuterol (DUONEB) 0.5-2.5 (3) MG/3ML nebulizer solution 3 mL    PHQ 2/9 Scores 01/06/2022 11/25/2021 11/13/2021 10/08/2021  PHQ - 2 Score 0 0 0 0  PHQ- 9 Score 0 0 0 0  GAD 7 : Generalized Anxiety Score 01/06/2022 11/25/2021 11/13/2021 10/08/2021  Nervous, Anxious, on Edge 0 0 0 0  Control/stop worrying 0 0 0 0  Worry too much - different things 0 0 0 0  Trouble relaxing 0 0 0 0  Restless 0 0 0 0  Easily annoyed or irritable 0 0 0 0  Afraid - awful might happen 0 0 0 0  Total GAD 7 Score 0 0 0 0  Anxiety Difficulty - Not difficult at all Not difficult at all Not difficult at all    BP Readings from Last 3 Encounters:  01/08/22 120/64  01/06/22 122/78  11/25/21 (!) 148/84    Physical Exam Vitals and nursing note reviewed.  Constitutional:      Appearance: She is well-developed.  HENT:     Head: Normocephalic.     Right Ear: External ear normal.     Left Ear: External ear normal.  Eyes:     General: Lids are everted, no foreign bodies appreciated. No scleral icterus.       Left eye: No foreign body or hordeolum.     Conjunctiva/sclera: Conjunctivae normal.     Right eye: Right conjunctiva is not injected.     Left eye: Left conjunctiva is not injected.     Pupils: Pupils are equal, round,  and reactive to light.  Neck:     Thyroid: No thyromegaly.     Vascular: No JVD.     Trachea: No tracheal deviation.  Cardiovascular:     Rate and Rhythm: Normal rate and regular rhythm.     Heart sounds: Normal heart sounds. No murmur heard.   No friction rub. No gallop.  Pulmonary:     Effort: Pulmonary effort is normal. No respiratory distress.     Breath sounds: Normal breath sounds. No wheezing or rales.  Abdominal:     General: Bowel sounds are normal.     Palpations: Abdomen is soft. There is no mass.     Tenderness: There is no abdominal tenderness. There is no guarding or rebound.  Musculoskeletal:        General: No tenderness. Normal range of motion.     Cervical back: Normal range of motion and neck supple.  Lymphadenopathy:     Cervical: No cervical adenopathy.  Skin:    General: Skin is warm.     Findings: No rash.  Neurological:     Mental Status: She is alert and oriented to person, place, and time.     Cranial Nerves: No cranial nerve deficit.     Deep Tendon Reflexes: Reflexes normal.  Psychiatric:        Mood and Affect: Mood is not anxious or depressed.    Wt Readings from Last 3 Encounters:  01/08/22 184 lb (83.5 kg)  01/06/22 181 lb (82.1 kg)  11/25/21 178 lb (80.7 kg)    BP 120/64    Pulse 88    Temp 98.6 F (37 C) (Oral)    Ht '5\' 3"'  (1.6 m)    Wt 184 lb (83.5 kg)    SpO2 97%    BMI 32.59 kg/m   Assessment and Plan:  1. Acute maxillary sinusitis, recurrence not specified New onset.  Persistent.  Stable.  Exam and history is consistent with upper respiratory infection presumably maxillary sinusitis.  We will treat with Augmentin 875 mg twice a day for 10 days. - amoxicillin-clavulanate (AUGMENTIN) 875-125 MG tablet; Take 1 tablet by mouth 2 (two) times daily.  Dispense: 20 tablet; Refill: 0  2. Reactive airway disease, mild intermittent, uncomplicated Chronic.  Controlled.  Stable.  Mild in presentation and intermittent in its course.  We will  refill albuterol inhaler 1 to 2 puffs as needed as needed shortness of breath because of baseline reactive airway disease and sequelae of below. - albuterol (VENTOLIN HFA) 108 (90 Base) MCG/ACT inhaler; Inhale 2 puffs into the lungs every 6 (six) hours as needed for wheezing or shortness of breath.  Dispense: 1 each; Refill: 2  3. Aspiration into airway, sequela Earlier in the week patient experienced an aspiration that required husband to assist.  There was no dysphagia but feels as if food went down the wrong way into the trachea.  There was some immediate coughing I do not feel that there is sustained pulmonary concerns and that lungs were clear to percussion and auscultation.  As noted we will resume albuterol inhaler. - albuterol (VENTOLIN HFA) 108 (90 Base) MCG/ACT inhaler; Inhale 2 puffs into the lungs every 6 (six) hours as needed for wheezing or shortness of breath.  Dispense: 1 each; Refill: 2

## 2022-01-08 NOTE — Patient Instructions (Signed)
Aspiration Pneumonia, Adult Aspiration pneumonia is an infection that occurs after lung (pulmonary) aspiration. Pulmonary aspiration is when you inhale a large amount of food, liquid, stomach acid, or saliva into the lungs. This can cause inflammation and infection in the lungs. This can make you cough and make it hard to breathe. Aspiration pneumonia is a serious condition and can be life-threatening. What are the causes? This condition may be caused by: Bacteria in food, liquid, stomach acid, or saliva that is inhaled into the lung. Irritation and inflammation that results from material, such as blood or a foreign body, being inhaled into the lung. This can lead to an infection even though the material is not originally contaminated with bacteria. What increases the risk? You are more likely to get aspiration pneumonia if you have a condition that makes it hard to breathe, swallow, cough, or gag. These conditions may include: A breathing disorder, such as chronic obstructive pulmonary disease, that makes it hard to eat or drink while breathing. A brain (neurologic) disorder, such as stroke, seizures, Parkinson's disease, dementia, amyotrophic lateral sclerosis (ALS), or brain injury. Having gastroesophageal reflux disease (GERD). Having a weak disease-fighting system (immune system). Having a narrowing of the tube that carries food to the stomach (esophageal narrowing). Other factors that may make you more likely to get aspiration pneumonia include: Being older than age 60 and frail. Being given a general anesthetic for procedures. Drinking too much alcohol and passing out. If you pass out and vomit, then vomit can be inhaled into your lungs. Taking certain medicines, such as tranquilizers or sedatives. Taking poor care of your mouth and teeth. Being malnourished. What are the signs or symptoms? The main symptom of pulmonary aspiration may be an episode of choking or coughing while eating or  drinking. When aspiration pneumonia develops, symptoms include: Persistent cough. Difficulty breathing, such as wheezing or shortness of breath. Fever. Chest pain. Being more tired than usual (fatigue). Pulmonary aspiration may be silent, meaning that it is not associated with coughing or choking while eating or drinking. How is this diagnosed? This condition may be diagnosed based on: A physical exam. Tests, such as: A chest X-ray. A sputum culture. Saliva and mucus (sputum) are collected from the lungs or the tubes that carry air to the lungs (bronchi). The sputum is then tested for bacteria. Oximetry. A sensor or clip is placed on areas such as a finger, earlobe, or toe to measure the oxygen level in your blood. Blood tests. A swallowing study. This test looks at how food is swallowed and whether it goes into your windpipe (trachea) or esophagus. A bronchoscopy. This test uses a flexible tube (bronchoscope) to see inside the lungs. How is this treated? This condition may be treated with: Medicines. Antibiotic medicine will be given to kill the pneumonia bacteria. Other medicines may also be used to reduce fever, pain, or inflammation. Breathing assistance and oxygen therapy. Depending on how well you are breathing, you may need to be given oxygen, or you may need breathing support from a breathing machine (ventilator). Thoracentesis. This is a procedure to remove fluid that has built up in the space between the linings of the chest wall and the lungs. Dietary changes. You may need to avoid certain food textures or liquids. For people who have recurrent aspiration pneumonia, a feeding tube might be placed in the stomach for nutrition. Follow these instructions at home: Medicines  Take over-the-counter and prescription medicines only as told by your health care provider.   If you were prescribed an antibiotic medicine, take it as told by your health care provider. Do not stop taking the  antibiotic even if you start to feel better. Take cough medicine only if you are losing sleep. Cough medicine can prevent your body's natural ability to remove mucus from your lungs. General instructions  Carefully follow any eating instructions you were given, such as avoiding certain food textures or thickening your liquids. Thickening liquids reduces the risk of developing aspiration pneumonia again. Return to normal activities as told by your health care provider. Ask your health care provider what activities are safe for you. Sleep in a semi-upright position at night. Try to sleep in a reclining chair, or place a few pillows under your head in bed. Do not use any products that contain nicotine or tobacco, such as cigarettes, e-cigarettes, and chewing tobacco. If you need help quitting, ask your health care provider. Keep all follow-up visits as told by your health care provider. This is important. Contact a health care provider if you: Have a fever. Are coughing or choking while eating or drinking. Continue to have signs or symptoms of aspiration pneumonia. Get help right away if you have: Worsening shortness of breath, wheezing, or difficulty breathing. Chest pain. Summary Aspiration pneumonia is an infection that occurs after lung (pulmonary) aspiration. Pulmonary aspiration is when you inhale a large amount of food, liquid, stomach acid, or saliva into the lungs. The main symptom of pulmonary aspiration may be an episode of choking or coughing. It may also be silent without coughing or choking. You are more likely to get aspiration pneumonia if you have a condition that makes it hard to breathe, swallow, cough, or gag. This information is not intended to replace advice given to you by your health care provider. Make sure you discuss any questions you have with your health care provider. Document Revised: 11/17/2019 Document Reviewed: 11/17/2019 Elsevier Patient Education  2022 Elsevier  Inc.  

## 2022-02-19 ENCOUNTER — Other Ambulatory Visit: Payer: Self-pay | Admitting: Family Medicine

## 2022-02-19 DIAGNOSIS — M5412 Radiculopathy, cervical region: Secondary | ICD-10-CM

## 2022-02-19 DIAGNOSIS — M5030 Other cervical disc degeneration, unspecified cervical region: Secondary | ICD-10-CM

## 2022-02-21 NOTE — Telephone Encounter (Signed)
Requested medications are due for refill today.  yes ? ?Requested medications are on the active medications list.  yes ? ?Last refill. 10/08/2021 #60 2 refills ? ?Future visit scheduled.   yes ? ?Notes to clinic.  Rx signed by Dr. Zigmund Daniel. ? ? ? ?Requested Prescriptions  ?Pending Prescriptions Disp Refills  ? gabapentin (NEURONTIN) 300 MG capsule [Pharmacy Med Name: Gabapentin 300 MG Oral Capsule] 60 capsule 2  ?  Sig: Take 1 capsule by mouth twice daily  ?  ? Neurology: Anticonvulsants - gabapentin Passed - 02/19/2022 11:37 AM  ?  ?  Passed - Cr in normal range and within 360 days  ?  Creatinine  ?Date Value Ref Range Status  ?11/26/2013 0.66 0.60 - 1.30 mg/dL Final  ? ?Creatinine, Ser  ?Date Value Ref Range Status  ?11/13/2021 0.61 0.57 - 1.00 mg/dL Final  ?  ?  ?  ?  Passed - Completed PHQ-2 or PHQ-9 in the last 360 days  ?  ?  Passed - Valid encounter within last 12 months  ?  Recent Outpatient Visits   ? ?      ? 1 month ago Acute maxillary sinusitis, recurrence not specified  ? [facility] Juline Patch, MD  ? 1 month ago Cervical radiculopathy  ? [facility] Montel Culver, MD  ? 2 months ago Cervical radiculopathy  ? [facility] Montel Culver, MD  ? 3 months ago Type 2 diabetes mellitus without complication, without long-term current use of insulin ([place])  ? [facility] Juline Patch, MD  ? 4 months ago Cervical radiculopathy  ? [facility] Montel Culver, MD  ? ?  ?  ?Future Appointments   ? ?        ? In 3 weeks Juline Patch, MD [place]  ? ?  ? ?  ?  ?  ?  ?

## 2022-03-16 ENCOUNTER — Telehealth: Payer: Self-pay | Admitting: Family Medicine

## 2022-03-16 ENCOUNTER — Encounter: Payer: Self-pay | Admitting: Family Medicine

## 2022-03-16 ENCOUNTER — Ambulatory Visit: Payer: Medicare PPO | Admitting: Family Medicine

## 2022-03-16 ENCOUNTER — Other Ambulatory Visit: Payer: Self-pay

## 2022-03-16 VITALS — BP 108/62 | HR 64 | Ht 63.0 in | Wt 178.0 lb

## 2022-03-16 DIAGNOSIS — M5412 Radiculopathy, cervical region: Secondary | ICD-10-CM

## 2022-03-16 DIAGNOSIS — M5030 Other cervical disc degeneration, unspecified cervical region: Secondary | ICD-10-CM

## 2022-03-16 DIAGNOSIS — E119 Type 2 diabetes mellitus without complications: Secondary | ICD-10-CM

## 2022-03-16 DIAGNOSIS — R079 Chest pain, unspecified: Secondary | ICD-10-CM

## 2022-03-16 MED ORDER — METFORMIN HCL 500 MG PO TABS
ORAL_TABLET | ORAL | 1 refills | Status: DC
Start: 2022-03-16 — End: 2022-07-16

## 2022-03-16 MED ORDER — DULOXETINE HCL 30 MG PO CPEP: ORAL_CAPSULE | ORAL | 0 refills | Status: DC

## 2022-03-16 MED ORDER — GLIPIZIDE ER 5 MG PO TB24: 5.0000 mg | ORAL_TABLET | Freq: Every day | ORAL | 1 refills | Status: DC

## 2022-03-16 NOTE — Telephone Encounter (Signed)
Pt stated she could not find the information and the lab had to draw her labs in her hand. ? ?KP ?

## 2022-03-16 NOTE — Telephone Encounter (Signed)
Copied from [place] [id number]. Topic: General - Other ?>> Mar 16, 2022  2:32 PM Tessa Lerner A wrote: ?Reason for CRM: The patient would like to speak with Baxter Flattery when possible ? ?Please contact further when available ?

## 2022-03-16 NOTE — Progress Notes (Signed)
? ? ?Date:  03/16/2022  ? ?Name:  Lindsey George   DOB:  Nov 05, 1951   MRN:  [id number] ? ? ?Chief Complaint: Diabetes and Depression ? ?Diabetes ?She presents for her follow-up diabetic visit. She has type 2 diabetes mellitus. Her disease course has been fluctuating. There are no hypoglycemic associated symptoms. Pertinent negatives for hypoglycemia include no dizziness, headaches or nervousness/anxiousness. Associated symptoms include chest pain. Pertinent negatives for diabetes include no blurred vision, no fatigue, no foot paresthesias, no foot ulcerations, no polydipsia, no polyphagia, no polyuria, no visual change, no weakness and no weight loss. There are no hypoglycemic complications. Symptoms are stable. Pertinent negatives for diabetic complications include no autonomic neuropathy, CVA, heart disease, impotence, nephropathy, peripheral neuropathy, PVD or retinopathy. Risk factors for coronary artery disease include dyslipidemia. She is compliant with treatment most of the time. She is following a generally healthy diet. Meal planning includes avoidance of concentrated sweets and carbohydrate counting. Her breakfast blood glucose is taken between 7-8 am. Her breakfast blood glucose range is generally 140-180 mg/dl.  ?Depression ?       Associated symptoms include no fatigue, no myalgias, no headaches and no suicidal ideas. ?Congestive Heart Failure ?Presents for initial visit. Associated symptoms include chest pain. Pertinent negatives include no abdominal pain, chest pressure, edema, fatigue, nocturia, orthopnea, palpitations, paroxysmal nocturnal dyspnea, shortness of breath or unexpected weight change.  ?Chest Pain  ?This is a new problem. The current episode started more than 1 year ago. The problem occurs rarely. The pain is present in the substernal region. The pain is moderate. The quality of the pain is described as sharp. Pertinent negatives include no abdominal pain, back pain, cough, dizziness,  fever, headaches, nausea, palpitations, shortness of breath or weakness. The treatment provided moderate relief.  ?Her past medical history is significant for CHF and diabetes.  ?Pertinent negatives for past medical history include no heart disease and no PVD.  ? ?Lab Results  ?Component Value Date  ? NA 145 (H) 11/13/2021  ? K 4.5 11/13/2021  ? CO2 26 11/13/2021  ? GLUCOSE 123 (H) 11/13/2021  ? BUN 20 11/13/2021  ? CREATININE 0.61 11/13/2021  ? CALCIUM 10.1 11/13/2021  ? EGFR 96 11/13/2021  ? GFRNONAA 92 11/11/2020  ? ?Lab Results  ?Component Value Date  ? CHOL 150 07/21/2021  ? HDL 51 07/21/2021  ? [place] 77 07/21/2021  ? TRIG 121 07/21/2021  ? CHOLHDL 2.9 09/19/2018  ? ?No results found for: TSH ?Lab Results  ?Component Value Date  ? HGBA1C 6.3 (H) 11/13/2021  ? ?Lab Results  ?Component Value Date  ? WBC 12.1 (H) 08/11/2018  ? HGB 12.9 08/11/2018  ? HCT 40.8 08/11/2018  ? MCV 80.5 08/11/2018  ? PLT 394 08/11/2018  ? ?Lab Results  ?Component Value Date  ? ALT 18 07/21/2021  ? AST 14 07/21/2021  ? ALKPHOS 51 07/21/2021  ? BILITOT 0.3 07/21/2021  ? ?No results found for: 25OHVITD2, [place], VD25OH  ? ?Review of Systems  ?Constitutional:  Negative for chills, fatigue, fever, unexpected weight change and weight loss.  ?HENT:  Negative for drooling, ear discharge, ear pain and sore throat.   ?Eyes:  Negative for blurred vision.  ?Respiratory:  Negative for cough, shortness of breath and wheezing.   ?Cardiovascular:  Positive for chest pain. Negative for palpitations and leg swelling.  ?Gastrointestinal:  Negative for abdominal pain, blood in stool, constipation, diarrhea and nausea.  ?Endocrine: Negative for polydipsia, polyphagia and polyuria.  ?Genitourinary:  Negative  for dysuria, frequency, hematuria, impotence, nocturia and urgency.  ?Musculoskeletal:  Negative for back pain, myalgias and neck pain.  ?Skin:  Negative for rash.  ?Allergic/Immunologic: Negative for environmental allergies.  ?Neurological:   Negative for dizziness, weakness and headaches.  ?Hematological:  Does not bruise/bleed easily.  ?Psychiatric/Behavioral:  Positive for depression. Negative for suicidal ideas. The patient is not nervous/anxious.   ? ?Patient Active Problem List  ? Diagnosis Date Noted  ? Change in bowel habits 12/10/2021  ? Pes anserinus bursitis of left knee 10/08/2021  ? DDD (degenerative disc disease), cervical 10/08/2021  ? Impingement syndrome of left shoulder 08/18/2021  ? Hand pain, left 06/26/2021  ? Loss of memory 06/26/2021  ? Tremor 06/26/2021  ? Lumbar stenosis with neurogenic claudication 08/27/2020  ? Chronic bilateral low back pain with bilateral sciatica 08/27/2020  ? Essential hypertension 03/06/2020  ? Cervical radiculopathy 03/06/2020  ? Asymptomatic varicose veins of both lower extremities 03/06/2020  ? Morbid obesity ([place]) 03/29/2019  ? Allergic rhinitis 01/19/2018  ? Hx of adenomatous colonic polyps 01/14/2018  ? Reactive airway disease without complication 00/92/3300  ? Back ache 05/22/2015  ? Alimentary obesity 05/22/2015  ? Type 2 diabetes mellitus without complication, without long-term current use of insulin ([place]) 04/12/2014  ? Hypercholesterolemia 04/12/2014  ? OP (osteoporosis) 04/12/2014  ? ? ?Allergies  ?Allergen Reactions  ? Codeine Other (See Comments)  ?  Abdominal pain  ? ? ?Past Surgical History:  ?Procedure Laterality Date  ? ABDOMINAL HYSTERECTOMY    ? BREAST BIOPSY Right 06/22/11  ? bx/clip-neg  ? BREAST CYST ASPIRATION Left   ? neg  ? COLONOSCOPY  2014  ? cleared for 5 yrs- [place] doc  ? COLONOSCOPY WITH PROPOFOL N/A 03/25/2018  ? Procedure: COLONOSCOPY WITH PROPOFOL;  Surgeon: Manya Silvas, MD;  Location: [facility] ENDOSCOPY;  Service: Endoscopy;  Laterality: N/A;  ? PARTIAL HYSTERECTOMY    ? SPLENECTOMY    ? ? ?Social History  ? ?Tobacco Use  ? Smoking status: Never  ? Smokeless tobacco: Never  ? Tobacco comments:  ?  smoking cessation materials not required  ?Vaping Use  ? Vaping Use: Never used   ?Substance Use Topics  ? Alcohol use: Not Currently  ? Drug use: Never  ? ? ? ?Medication list has been reviewed and updated. ? ?Current Meds  ?Medication Sig  ? albuterol (VENTOLIN HFA) 108 (90 Base) MCG/ACT inhaler Inhale 2 puffs into the lungs every 6 (six) hours as needed for wheezing or shortness of breath.  ? alendronate (FOSAMAX) 70 MG tablet TAKE 1 TABLET BY MOUTH ONCE A WEEK TAKE  WITH  A  FULL  GLASS  OF  WATER  ON  AN  EMPTY  STOMACH  ? aspirin 81 MG tablet Take 81 mg by mouth daily.  ? calcium-vitamin D (OSCAL WITH D) 500-200 MG-UNIT tablet Take 1 tablet by mouth daily with breakfast.  ? Cholecalciferol (VITAMIN D3 PO) Take 1 capsule by mouth daily.  ? DULoxetine (CYMBALTA) 30 MG capsule Take 1 capsule (30 mg total) by mouth daily for 30 days, THEN 2 capsules (60 mg total) daily. (Patient taking differently: Take 2 capsules daily= 53m a day)  ? EPIPEN 2-PAK 0.3 MG/0.3ML SOAJ injection Inject 0.3 mg into the muscle as needed.  ? gabapentin (NEURONTIN) 300 MG capsule Take 1 capsule by mouth twice daily (Patient taking differently: Take 300 mg by mouth 2 (two) times daily. SManuella Ghazi  ? glipiZIDE (GLIPIZIDE XL) 5 MG 24 hr tablet Take 1  tablet (5 mg total) by mouth daily.  ? losartan (COZAAR) 25 MG tablet Take 1 tablet (25 mg total) by mouth daily.  ? metFORMIN (GLUCOPHAGE) 500 MG tablet TAKE ONE TABLET BY MOUTH TWICE DAILY  ? Multiple Vitamin (MULTIVITAMIN) capsule Take 1 capsule by mouth daily.  ? ONETOUCH ULTRA test strip USE 1 STRIP TO CHECK GLUCOSE ONCE DAILY  ? simvastatin (ZOCOR) 40 MG tablet Take 1 tablet by mouth once daily  ? [DISCONTINUED] amoxicillin-clavulanate (AUGMENTIN) 875-125 MG tablet Take 1 tablet by mouth 2 (two) times daily.  ? ?Current Facility-Administered Medications for the 03/16/22 encounter (Office Visit) with Juline Patch, MD  ?Medication  ? ipratropium-albuterol (DUONEB) 0.5-2.5 (3) MG/3ML nebulizer solution 3 mL  ? ? ? ?  03/16/2022  ? 10:36 AM 01/06/2022  ?  9:51 AM 11/25/2021  ?  10:35 AM 11/13/2021  ?  8:09 AM  ?GAD 7 : Generalized Anxiety Score  ?Nervous, Anxious, on Edge 0 0 0 0  ?Control/stop worrying 0 0 0 0  ?Worry too much - different things 0 0 0 0  ?Trouble relaxing 0 0 0

## 2022-03-17 LAB — RENAL FUNCTION PANEL
Albumin: 4.8 g/dL — ABNORMAL HIGH (ref 3.7–4.7)
BUN/Creatinine Ratio: 33 — ABNORMAL HIGH (ref 12–28)
BUN: 17 mg/dL (ref 8–27)
CO2: 25 mmol/L (ref 20–29)
Calcium: 10.1 mg/dL (ref 8.7–10.3)
Chloride: 98 mmol/L (ref 96–106)
Creatinine, Ser: 0.51 mg/dL — ABNORMAL LOW (ref 0.57–1.00)
Glucose: 124 mg/dL — ABNORMAL HIGH (ref 70–99)
Phosphorus: 3.8 mg/dL (ref 3.0–4.3)
Potassium: 4.4 mmol/L (ref 3.5–5.2)
Sodium: 140 mmol/L (ref 134–144)
eGFR: 99 mL/min/{1.73_m2} (ref >59)

## 2022-03-17 LAB — LIPID PANEL WITH LDL/HDL RATIO
Cholesterol, Total: 130 mg/dL (ref 100–199)
HDL: 51 mg/dL (ref >39)
LDL Chol Calc (NIH): 61 mg/dL (ref 0–99)
LDL/HDL Ratio: 1.2 ratio (ref 0.0–3.2)
Triglycerides: 93 mg/dL (ref 0–149)
VLDL Cholesterol Cal: 18 mg/dL (ref 5–40)

## 2022-03-17 LAB — SPECIMEN STATUS REPORT

## 2022-03-17 LAB — HGB A1C W/O EAG: Hgb A1c MFr Bld: 7.1 % — ABNORMAL HIGH (ref 4.8–5.6)

## 2022-03-19 ENCOUNTER — Other Ambulatory Visit: Payer: Self-pay | Admitting: Family Medicine

## 2022-03-19 DIAGNOSIS — R251 Tremor, unspecified: Secondary | ICD-10-CM | POA: Diagnosis not present

## 2022-03-19 DIAGNOSIS — M5136 Other intervertebral disc degeneration, lumbar region: Secondary | ICD-10-CM | POA: Diagnosis not present

## 2022-03-19 DIAGNOSIS — M810 Age-related osteoporosis without current pathological fracture: Secondary | ICD-10-CM

## 2022-03-19 DIAGNOSIS — R9082 White matter disease, unspecified: Secondary | ICD-10-CM | POA: Diagnosis not present

## 2022-03-19 DIAGNOSIS — E538 Deficiency of other specified B group vitamins: Secondary | ICD-10-CM | POA: Diagnosis not present

## 2022-03-19 DIAGNOSIS — E559 Vitamin D deficiency, unspecified: Secondary | ICD-10-CM | POA: Diagnosis not present

## 2022-03-20 ENCOUNTER — Telehealth: Payer: Self-pay | Admitting: Family Medicine

## 2022-03-20 LAB — SPECIMEN STATUS REPORT

## 2022-03-20 LAB — MICROALBUMIN / CREATININE URINE RATIO
Creatinine, Urine: 153 mg/dL
Microalb/Creat Ratio: 484 mg/g creat — ABNORMAL HIGH (ref 0–29)
Microalbumin, Urine: 740.5 ug/mL

## 2022-03-20 NOTE — Telephone Encounter (Signed)
Spoke to patient with lab results. Pt verbalized understanding. ? ?KP ?

## 2022-03-20 NOTE — Telephone Encounter (Signed)
Pt is calling to let Lindsey George know that she has not seeing the lab results regarding the protein in her urine. Please advise CB- [phone] ?

## 2022-03-30 ENCOUNTER — Ambulatory Visit: Payer: Self-pay | Admitting: *Deleted

## 2022-03-30 ENCOUNTER — Other Ambulatory Visit: Payer: Self-pay

## 2022-03-30 DIAGNOSIS — M5030 Other cervical disc degeneration, unspecified cervical region: Secondary | ICD-10-CM

## 2022-03-30 DIAGNOSIS — M5412 Radiculopathy, cervical region: Secondary | ICD-10-CM

## 2022-03-30 MED ORDER — DULOXETINE HCL 30 MG PO CPEP: ORAL_CAPSULE | ORAL | 2 refills | Status: DC

## 2022-03-30 MED ORDER — GABAPENTIN 300 MG PO CAPS: 300.0000 mg | ORAL_CAPSULE | Freq: Two times a day (BID) | ORAL | 2 refills | Status: DC

## 2022-03-30 NOTE — Telephone Encounter (Signed)
Requested Prescriptions  ?Pending Prescriptions Disp Refills  ?? gabapentin (NEURONTIN) 300 MG capsule 60 capsule 0  ?  Sig: Take 1 capsule (300 mg total) by mouth 2 (two) times daily.  ?  ? Neurology: Anticonvulsants - gabapentin Failed - 03/30/2022  2:20 PM  ?  ?  Failed - Cr in normal range and within 360 days  ?  Creatinine  ?Date Value Ref Range Status  ?11/26/2013 0.66 0.60 - 1.30 mg/dL Final  ? ?Creatinine, Ser  ?Date Value Ref Range Status  ?03/16/2022 0.51 (L) 0.57 - 1.00 mg/dL Final  ?   ?  ?  Passed - Completed PHQ-2 or PHQ-9 in the last 360 days  ?  ?  Passed - Valid encounter within last 12 months  ?  Recent Outpatient Visits   ?      ? 2 weeks ago Chest pain, unspecified type  ? [facility] Juline Patch, MD  ? 2 months ago Acute maxillary sinusitis, recurrence not specified  ? [facility] Juline Patch, MD  ? 2 months ago Cervical radiculopathy  ? [facility] Montel Culver, MD  ? 4 months ago Cervical radiculopathy  ? [facility] Montel Culver, MD  ? 4 months ago Type 2 diabetes mellitus without complication, without long-term current use of insulin ([place])  ? [facility] Juline Patch, MD  ?  ?  ?Future Appointments   ?        ? In 3 months Juline Patch, MD [facility]  ?  ? ?  ?  ?  ?? DULoxetine (CYMBALTA) 30 MG capsule 90 capsule 0  ?  Sig: Take 2 capsules daily= 65m a day  ?  ? Psychiatry: Antidepressants - SNRI - duloxetine Failed - 03/30/2022  2:20 PM  ?  ?  Failed - Cr in normal range and within 360 days  ?  Creatinine  ?Date Value Ref Range Status  ?11/26/2013 0.66 0.60 - 1.30 mg/dL Final  ? ?Creatinine, Ser  ?Date Value Ref Range Status  ?03/16/2022 0.51 (L) 0.57 - 1.00 mg/dL Final  ?   ?  ?  Passed - eGFR is 30 or above and within 360 days  ?  EGFR (African American)  ?Date Value Ref Range Status  ?11/26/2013 >60  Final  ? ?GFR calc Af AWyvonnia Lora ?Date Value Ref Range Status  ?11/11/2020 106 >59 mL/min/1.73 Final  ?   Comment:  ?  **In accordance with recommendations from the NKF-ASN Task force,** ?  [facility] is in the process of updating its eGFR calculation to the ?  2021 CKD-EPI creatinine equation that estimates kidney function ?  without a race variable. ?  ? ?EGFR (Non-African Amer.)  ?Date Value Ref Range Status  ?11/26/2013 >60  Final  ?  Comment:  ?  eGFR values <69mmin/1.73 m2 may be an indication of chronic ?kidney disease (CKD). ?Calculated eGFR is useful in patients with stable renal function. ?The eGFR calculation will not be reliable in acutely ill patients ?when serum creatinine is changing rapidly. It is not useful in  ?patients on dialysis. The eGFR calculation may not be applicable ?to patients at the low and high extremes of body sizes, pregnant ?women, and vegetarians. ?  ? ?GFR calc non Af Amer  ?Date Value Ref Range Status  ?11/11/2020 92 >59 mL/min/1.73 Final  ? ?eGFR  ?Date Value Ref Range Status  ?03/16/2022 99 >59 mL/min/1.73 Final  ?   ?  ?  Passed - Completed PHQ-2 or PHQ-9 in the last 360 days  ?  ?  Passed - Last BP in normal range  ?  BP Readings from Last 1 Encounters:  ?03/16/22 108/62  ?   ?  ?  Passed - Valid encounter within last 6 months  ?  Recent Outpatient Visits   ?      ? 2 weeks ago Chest pain, unspecified type  ? [facility] Juline Patch, MD  ? 2 months ago Acute maxillary sinusitis, recurrence not specified  ? [facility] Juline Patch, MD  ? 2 months ago Cervical radiculopathy  ? [facility] Montel Culver, MD  ? 4 months ago Cervical radiculopathy  ? [facility] Montel Culver, MD  ? 4 months ago Type 2 diabetes mellitus without complication, without long-term current use of insulin ([place])  ? [facility] Juline Patch, MD  ?  ?  ?Future Appointments   ?        ? In 3 months Juline Patch, MD [facility] [place]  ?  ? ?  ?  ?  ? ? ?

## 2022-03-30 NOTE — Telephone Encounter (Signed)
Summary: Medication Manageme  ? Pt stated she has no more refills for gabapentin( Neurontin) 300 mg capsule and DULoxetine (CYMBALTA) 30 MG capsule and wanted to ask Dr.Matthews nurse if she should request more refills or  if she is done taking medication.  ? ?Pt seeking clinical advice.   ?  ?Attempted to call patient- left message to call office on VM ? ?In review:  ?DULoxetine (CYMBALTA) 30 MG- is twice daily-  rx is wrong in # given if patient is taking 2/day ? ?gabapentin( Neurontin) 300 mg capsule- is 3 times/day per last visit with neurology 4/20 ? ?Need to verify how taking before sending to office ? ? ? ? ?

## 2022-03-30 NOTE — Telephone Encounter (Signed)
Second attempt to call patient- left message to call office. 

## 2022-03-30 NOTE — Telephone Encounter (Signed)
?  Chief Complaint: med refill ?Symptoms: NA ?Frequency: NA ?Pertinent Negatives: NA ?Disposition: '[]'$ ED /'[]'$ Urgent Care (no appt availability in office) / '[]'$ Appointment(In office/virtual)/ '[]'$  [place] [facility]/ '[x]'$ Home Care/ '[]'$ Refused Recommended Disposition /'[]'$ [place] [facility]/ '[]'$  Follow-up with PCP ?Additional Notes: pt called in needing refill for duloxetine and gabapentin. Was able to refill both rx per protocol. Advised pt they should both be send to pharmacy. No further assistance needed.  ? ?Summary: Medication Manageme  ?  Pt stated she has no more refills for gabapentin( Neurontin) 300 mg capsule and DULoxetine (CYMBALTA) 30 MG capsule and wanted to ask Dr.Matthews nurse if she should request more refills or  if she is done taking medication.   ?  ? ? ?Reason for Disposition ? [1] Caller requesting a prescription renewal (no refills left), no triage required, AND [2] triager able to renew prescription per department policy ? ?Answer Assessment - Initial Assessment Questions ?1. DRUG NAME: "What medicine do you need to have refilled?" ?    Duloxetine and gabapentin ?2. REFILLS REMAINING: "How many refills are remaining?" (Note: The label on the medicine or pill bottle will show how many refills are remaining. If there are no refills remaining, then a renewal may be needed.) ?    0 ?4. PRESCRIBING HCP: "Who prescribed it?" Reason: If prescribed by specialist, call should be referred to that group. ?    Dr. Zigmund Daniel and Dr. Ronnald Ramp ? ?Protocols used: Medication Refill and Renewal Call-A-AH ? ?

## 2022-03-31 DIAGNOSIS — E538 Deficiency of other specified B group vitamins: Secondary | ICD-10-CM | POA: Diagnosis not present

## 2022-04-07 DIAGNOSIS — E538 Deficiency of other specified B group vitamins: Secondary | ICD-10-CM | POA: Diagnosis not present

## 2022-04-14 DIAGNOSIS — E538 Deficiency of other specified B group vitamins: Secondary | ICD-10-CM | POA: Diagnosis not present

## 2022-04-21 DIAGNOSIS — G20 Parkinson's disease: Secondary | ICD-10-CM | POA: Diagnosis not present

## 2022-04-21 DIAGNOSIS — R251 Tremor, unspecified: Secondary | ICD-10-CM | POA: Diagnosis not present

## 2022-04-21 DIAGNOSIS — E538 Deficiency of other specified B group vitamins: Secondary | ICD-10-CM | POA: Diagnosis not present

## 2022-04-26 ENCOUNTER — Other Ambulatory Visit: Payer: Self-pay | Admitting: Family Medicine

## 2022-04-26 DIAGNOSIS — E119 Type 2 diabetes mellitus without complications: Secondary | ICD-10-CM

## 2022-04-26 DIAGNOSIS — E7800 Pure hypercholesterolemia, unspecified: Secondary | ICD-10-CM

## 2022-05-22 DIAGNOSIS — E538 Deficiency of other specified B group vitamins: Secondary | ICD-10-CM | POA: Diagnosis not present

## 2022-05-22 DIAGNOSIS — G20 Parkinson's disease: Secondary | ICD-10-CM | POA: Diagnosis not present

## 2022-05-22 DIAGNOSIS — M5136 Other intervertebral disc degeneration, lumbar region: Secondary | ICD-10-CM | POA: Diagnosis not present

## 2022-05-22 DIAGNOSIS — G939 Disorder of brain, unspecified: Secondary | ICD-10-CM | POA: Diagnosis not present

## 2022-05-22 DIAGNOSIS — G629 Polyneuropathy, unspecified: Secondary | ICD-10-CM | POA: Diagnosis not present

## 2022-05-28 ENCOUNTER — Other Ambulatory Visit: Payer: Self-pay | Admitting: Family Medicine

## 2022-05-28 ENCOUNTER — Other Ambulatory Visit: Payer: Self-pay | Admitting: Anesthesiology

## 2022-05-28 DIAGNOSIS — Z1231 Encounter for screening mammogram for malignant neoplasm of breast: Secondary | ICD-10-CM

## 2022-06-17 ENCOUNTER — Other Ambulatory Visit: Payer: Self-pay | Admitting: Family Medicine

## 2022-06-17 DIAGNOSIS — M810 Age-related osteoporosis without current pathological fracture: Secondary | ICD-10-CM

## 2022-06-18 NOTE — Telephone Encounter (Signed)
Requested medications are due for refill today.  yes  Requested medications are on the active medications list.  yes  Last refill. 03/20/2022 #12 0 refills  Future visit scheduled.   yes  Notes to clinic.  Missing labs    Requested Prescriptions  Pending Prescriptions Disp Refills   alendronate (FOSAMAX) 70 MG tablet [Pharmacy Med Name: Alendronate Sodium 70 MG Oral Tablet] 12 tablet 0    Sig: TAKE 1 TABLET BY MOUTH ONCE A WEEK TAKE  WITH  A  FULL  GLASS  OF  WATER  ON  AN  EMPTY  STOMACH     Endocrinology:  Bisphosphonates Failed - 06/17/2022  5:14 PM      Failed - Vitamin D in normal range and within 360 days    No results found for: "[id number]", "FH5456YB6", "LS937DS2AJG", "25OHVITD3", "25OHVITD2", "25OHVITD1", "VD25OH"       Failed - Cr in normal range and within 360 days    Creatinine  Date Value Ref Range Status  11/26/2013 0.66 0.60 - 1.30 mg/dL Final   Creatinine, Ser  Date Value Ref Range Status  03/16/2022 0.51 (L) 0.57 - 1.00 mg/dL Final         Failed - Mg Level in normal range and within 360 days    No results found for: "MG"       Passed - Ca in normal range and within 360 days    Calcium  Date Value Ref Range Status  03/16/2022 10.1 8.7 - 10.3 mg/dL Final   Calcium, Total  Date Value Ref Range Status  11/26/2013 9.4 8.5 - 10.1 mg/dL Final         Passed - Phosphate in normal range and within 360 days    Phosphorus  Date Value Ref Range Status  03/16/2022 3.8 3.0 - 4.3 mg/dL Final         Passed - eGFR is 30 or above and within 360 days    EGFR (African American)  Date Value Ref Range Status  11/26/2013 >60  Final   GFR calc Af Amer  Date Value Ref Range Status  11/11/2020 106 >59 mL/min/1.73 Final    Comment:    **In accordance with recommendations from the NKF-ASN Task force,**   [facility] is in the process of updating its eGFR calculation to the   2021 CKD-EPI creatinine equation that estimates kidney function   without a race variable.     EGFR (Non-African Amer.)  Date Value Ref Range Status  11/26/2013 >60  Final    Comment:    eGFR values <57m/min/1.73 m2 may be an indication of chronic kidney disease (CKD). Calculated eGFR is useful in patients with stable renal function. The eGFR calculation will not be reliable in acutely ill patients when serum creatinine is changing rapidly. It is not useful in  patients on dialysis. The eGFR calculation may not be applicable to patients at the low and high extremes of body sizes, pregnant women, and vegetarians.    GFR calc non Af Amer  Date Value Ref Range Status  11/11/2020 92 >59 mL/min/1.73 Final   eGFR  Date Value Ref Range Status  03/16/2022 99 >59 mL/min/1.73 Final         Passed - Valid encounter within last 12 months    Recent Outpatient Visits           3 months [facility], [facility]   5 months ago Acute maxillary  sinusitis, recurrence not specified   [facility] Juline Patch, MD   5 months ago Cervical radiculopathy   [facility] Montel Culver, MD   6 months ago Cervical radiculopathy   [facility] Montel Culver, MD   7 months ago Type 2 diabetes mellitus without complication, without long-term current use of insulin [facility])   [facility] Juline Patch, MD       Future Appointments             In 4 weeks Juline Patch, MD [place] Density or Dexa Scan completed in the last 2 years

## 2022-06-22 ENCOUNTER — Ambulatory Visit (INDEPENDENT_AMBULATORY_CARE_PROVIDER_SITE_OTHER): Payer: Medicare PPO

## 2022-06-22 VITALS — BP 106/64 | HR 90 | Temp 98.1°F | Resp 17 | Ht 61.5 in | Wt 179.0 lb

## 2022-06-22 DIAGNOSIS — Z0000 Encounter for general adult medical examination without abnormal findings: Secondary | ICD-10-CM | POA: Diagnosis not present

## 2022-06-22 DIAGNOSIS — E538 Deficiency of other specified B group vitamins: Secondary | ICD-10-CM | POA: Diagnosis not present

## 2022-06-22 NOTE — Progress Notes (Signed)
Subjective:   Lindsey George is a 71 y.o. female who presents for Medicare Annual (Subsequent) preventive examination.  Review of Systems    Per HPI unless specifically indicated below Cardiac Risk Factors include: advanced age (>10mn, >>40women); female gender          Objective:    Today's Vitals   06/22/22 0914  BP: 106/64  Pulse: 90  Resp: 17  Temp: 98.1 F (36.7 C)  TempSrc: Oral  SpO2: 98%  Weight: 179 lb (81.2 kg)  Height: 5' 1.5" (1.562 m)  PainSc: 0-No pain   Body mass index is 33.27 kg/m.     06/11/2021    9:31 AM 06/10/2020    8:39 AM 06/05/2019    8:51 AM 06/01/2018    3:04 PM 03/25/2018    7:07 AM 08/30/2015   10:14 AM  Advanced Directives  Does Patient Have a Medical Advance Directive? No No No No No No  Would patient like information on creating a medical advance directive? Yes (MAU/Ambulatory/Procedural Areas - Information given) No - Patient declined Yes (MAU/Ambulatory/Procedural Areas - Information given) Yes (MAU/Ambulatory/Procedural Areas - Information given)  No - patient declined information    Current Medications (verified) Outpatient Encounter Medications as of 06/22/2022  Medication Sig   albuterol (VENTOLIN HFA) 108 (90 Base) MCG/ACT inhaler Inhale 2 puffs into the lungs every 6 (six) hours as needed for wheezing or shortness of breath.   alendronate (FOSAMAX) 70 MG tablet TAKE 1 TABLET BY MOUTH ONCE A WEEK TAKE WITH A FULL GLASS OF WATER ON AN EMPTY STOMACH   aspirin 81 MG tablet Take 81 mg by mouth daily.   calcium-vitamin D (OSCAL WITH D) 500-200 MG-UNIT tablet Take 1 tablet by mouth daily with breakfast.   carbidopa-levodopa (SINEMET IR) 25-100 MG tablet Take 1 tablet by mouth 3 (three) times daily.   Cholecalciferol (VITAMIN D3 PO) Take 1 capsule by mouth daily.   DULoxetine (CYMBALTA) 30 MG capsule Take 2 capsules daily= '60mg'$  a day   EPIPEN 2-PAK 0.3 MG/0.3ML SOAJ injection Inject 0.3 mg into the muscle as needed.   gabapentin  (NEURONTIN) 300 MG capsule Take 1 capsule (300 mg total) by mouth 2 (two) times daily. (Patient taking differently: Take 300 mg by mouth daily.)   glipiZIDE (GLIPIZIDE XL) 5 MG 24 hr tablet Take 1 tablet (5 mg total) by mouth daily.   losartan (COZAAR) 25 MG tablet Take 1 tablet (25 mg total) by mouth daily.   metFORMIN (GLUCOPHAGE) 500 MG tablet TAKE ONE TABLET BY MOUTH TWICE DAILY   Multiple Vitamin (MULTIVITAMIN) capsule Take 1 capsule by mouth daily.   ONETOUCH ULTRA test strip USE 1 STRIP TO CHECK GLUCOSE ONCE DAILY   pioglitazone (ACTOS) 15 MG tablet Take 15 mg by mouth daily.   simvastatin (ZOCOR) 40 MG tablet Take 1 tablet by mouth once daily   Facility-Administered Encounter Medications as of 06/22/2022  Medication   ipratropium-albuterol (DUONEB) 0.5-2.5 (3) MG/3ML nebulizer solution 3 mL    Allergies (verified) Codeine   History: Past Medical History:  Diagnosis Date   Asthma    [place]    skin [place]   Chronic back pain    [place]    Hyperlipidemia    Hypertension    Obesity    Osteoporosis    Past Surgical History:  Procedure Laterality Date   ABDOMINAL HYSTERECTOMY     BREAST BIOPSY Right 06/22/11   bx/clip-neg   BREAST CYST  ASPIRATION Left    neg   COLONOSCOPY  2014   cleared for 5 yrs- [facility]   COLONOSCOPY WITH PROPOFOL N/A 03/25/2018   Procedure: COLONOSCOPY WITH PROPOFOL;  Surgeon: Manya Silvas, MD;  [facility];  Service: Endoscopy;  Laterality: N/A;   PARTIAL HYSTERECTOMY     SPLENECTOMY     Family History  Problem Relation Age of Onset   Breast cancer Sister 25   Cancer Sister    Heart disease Sister    Cancer Father    Stroke Father    Heart disease Sister    Social History   Socioeconomic History   Marital status: Married    Spouse name: Saima Monterroso   Number of children: 2   Years of education: 14   Highest education level: Associate degree: academic program  Occupational  History   Occupation: Retired  Tobacco Use   Smoking status: Never   Smokeless tobacco: Never   Tobacco comments:    smoking cessation materials not required  Vaping Use   Vaping Use: Never used  Substance and Sexual Activity   Alcohol use: Not Currently   Drug use: Never   Sexual activity: Yes    Partners: Male  Other Topics Concern   Not on file  Social History Narrative   Not on file   Social Determinants of Health   Financial Resource Strain: Low Risk  (06/22/2022)   Overall Financial Resource Strain (CARDIA)    Difficulty of Paying Living Expenses: Not hard at all  Food Insecurity: No Food Insecurity (06/22/2022)   Hunger Vital Sign    Worried About Running Out of Food in the Last Year: Never true    [place] in the Last Year: Never true  Transportation Needs: No Transportation Needs (06/22/2022)   PRAPARE - Hydrologist (Medical): No    Lack of Transportation (Non-Medical): No  Physical Activity: Sufficiently Active (06/22/2022)   Exercise Vital Sign    Days of Exercise per Week: 7 days    Minutes of Exercise per Session: 30 min  Stress: No Stress Concern Present (06/22/2022)   [place]    Feeling of Stress : Only a little  Social Connections: Moderately Integrated (06/11/2021)   Social Connection and Isolation Panel [NHANES]    Frequency of Communication with Friends and Family: More than three times a week    Frequency of Social Gatherings with Friends and Family: Three times a week    Attends Religious Services: More than 4 times per year    Active Member of Clubs or Organizations: No    Attends Archivist Meetings: Never    Marital Status: Married    Tobacco Counseling Not indicated   Clinical Intake:  Pre-visit preparation completed: No  Pain : No/denies pain Pain Score: 0-No pain     Nutritional Status: BMI 25 -29 Overweight Nutritional  Risks: None Diabetes: Yes CBG done?: Yes CBG resulted in Enter/ Edit results?: No Did pt. bring in CBG monitor from home?: No Glucose Meter Downloaded?: No  How often do you need to have someone help you when you read instructions, pamphlets, or other written materials from your doctor or pharmacy?: 1 - Never  Diabetic?Nutrition Risk Assessment:  Has the patient had any N/V/D within the last 2 months?  No  Does the patient have any non-healing wounds?  No  Has the patient had any unintentional weight  loss or weight gain?  No   Diabetes:  Is the patient diabetic?  Yes  If diabetic, was a CBG obtained today?  No  Did the patient bring in their glucometer from home?  Yes  How often do you monitor your CBG's? Once a week .   Financial Strains and Diabetes Management:  Are you having any financial strains with the device, your supplies or your medication? No .  Does the patient want to be seen by Chronic Care Management for management of their diabetes?  No  Would the patient like to be referred to a Nutritionist or for Diabetic Management?  No   Diabetic Exams:  Diabetic Eye Exam: 11/18/2021 Diabetic Foot Exam: Completed 05/21/2021,DUE    Interpreter Needed?: No  Information entered by :: Donnie Mesa, CMA   Activities of Daily Living    06/22/2022    9:11 AM 01/06/2022    9:51 AM  In your present state of health, do you have any difficulty performing the following activities:  Hearing? 0 0  Vision? 0 0  Difficulty concentrating or making decisions? 1 0  Walking or climbing stairs? 0 0  Dressing or bathing? 0 0  Doing errands, shopping? 0 0    Patient Care Team: Juline Patch, MD as PCP - General (Family Medicine) Wonda Cerise, PA-C (Physician Assistant) Reche Dixon, PA-C (Orthopedic Surgery)  Indicate any recent Medical Services you may have received from other than [facility] providers in the past year (date may be approximate).  No hospitalizations in the past 12  months     Assessment:   This is a routine wellness examination for [place].  Hearing/Vision screen No results found.  Dietary issues and exercise activities discussed: Current Exercise Habits: Structured exercise class, Type of exercise: walking, Time (Minutes): 30, Frequency (Times/Week): 7, Weekly Exercise (Minutes/Week): 210, Intensity: Mild No hospitalization in the past 12 months   Goals Addressed   None    Depression Screen    06/22/2022    9:12 AM 03/16/2022   10:35 AM 01/06/2022    9:51 AM 11/25/2021   10:35 AM 11/13/2021    8:08 AM 10/08/2021   10:10 AM 08/18/2021    9:48 AM  PHQ 2/9 Scores  PHQ - 2 Score 0 0 0 0 0 0 0  PHQ- 9 Score 2 0 0 0 0 0 1    Fall Risk    06/22/2022    9:11 AM 01/06/2022    9:51 AM 11/25/2021   10:35 AM 11/13/2021    8:08 AM 10/08/2021    9:48 AM  Fall Risk   Falls in the past year? 0 0 0 0 0  Number falls in past yr: 0 0 0 0 0  Injury with Fall? 0 0 0 0 0  Risk for fall due to : No Fall Risks No Fall Risks Orthopedic patient No Fall Risks Orthopedic patient  Follow up Falls evaluation completed Falls evaluation completed Falls evaluation completed Falls evaluation completed Falls evaluation completed    [place]:  Any stairs in or around the home? No  If so, are there any without handrails? No  Home free of loose throw rugs in walkways, pet beds, electrical cords, etc? Yes  Adequate lighting in your home to reduce risk of falls? Yes   ASSISTIVE DEVICES UTILIZED TO PREVENT FALLS:  Life alert? No  Use of a cane, walker or w/c? No  Grab bars in the bathroom? No  Shower chair or bench in shower? No  Elevated toilet seat or a handicapped toilet? No   TIMED UP AND GO:  Was the test performed? Yes .  Length of time to ambulate 10 feet: 10  sec.   Gait steady and fast without use of assistive device  Cognitive Function:        06/22/2022    9:13 AM 06/10/2020    8:41 AM 06/05/2019    8:55 AM  06/01/2018    3:08 PM  6CIT Screen  What Year? 0 points 0 points 0 points 0 points  What month? 0 points 0 points 0 points 0 points  What time? 0 points 0 points 0 points 0 points  Count back from 20 0 points 0 points 0 points 0 points  Months in reverse 0 points 0 points 0 points 0 points  Repeat phrase 0 points 0 points 0 points 2 points  Total Score 0 points 0 points 0 points 2 points    Immunizations Immunization History  Administered Date(s) Administered   Fluad Quad(high Dose 65+) 09/01/2019, 11/11/2020, 09/10/2021   Influenza, High Dose Seasonal PF 08/05/2017, 09/19/2018   Influenza,inj,Quad PF,6+ Mos 08/19/2016   PFIZER Comirnaty(Gray Top)Covid-19 Tri-Sucrose Vaccine 10/03/2020   PFIZER(Purple Top)SARS-COV-2 Vaccination 01/05/2020, 01/26/2020, 03/18/2021   Pfizer Covid-19 Vaccine Bivalent Booster 52yr & up 10/09/2021   Pneumococcal Conjugate-13 09/03/2015   Pneumococcal Polysaccharide-23 09/30/2009, 08/05/2017   Td 07/31/2004   Tdap 08/19/2016   Zoster, Live 07/20/2016    TDAP status: Up to date  Flu Vaccine status: Up to date  Pneumococcal vaccine status: Up to date  Covid-19 vaccine status: Completed vaccines  Qualifies for Shingles Vaccine? Yes   Zostavax completed No   Shingrix Completed?: No.    Education has been provided regarding the importance of this vaccine. Patient has been advised to call insurance company to determine out of pocket expense if they have not yet received this vaccine. Advised may also receive vaccine at local pharmacy or Health Dept. Verbalized acceptance and understanding.  Screening Tests Health Maintenance  Topic Date Due   Zoster Vaccines- Shingrix (1 of 2) Never done   COVID-19 Vaccine (6 - Pfizer series) 02/06/2022   FOOT EXAM  05/21/2022   MAMMOGRAM  06/27/2022   INFLUENZA VACCINE  06/30/2022   HEMOGLOBIN A1C  09/15/2022   OPHTHALMOLOGY EXAM  11/28/2022   COLONOSCOPY (Pts 45-438yrInsurance coverage will need to be  confirmed)  03/26/2023   TETANUS/TDAP  08/19/2026   Pneumonia Vaccine 6518Years old  Completed   DEXA SCAN  Completed   Hepatitis C Screening  Completed   HPV VACCINES  Aged Out    Health Maintenance  Health Maintenance Due  Topic Date Due   Zoster Vaccines- Shingrix (1 of 2) Never done   COVID-19 Vaccine (6 - Pfizer series) 02/06/2022   FOOT EXAM  05/21/2022    Colorectal cancer screening: Type of screening: Colonoscopy. Completed 03/25/2018. Repeat every 5 years  Mammogram status: Ordered scheduled for 06/27/2022. Pt provided with contact info and advised to call to schedule appt.   DEXA SCAN: 07/22/2021  Lung Cancer Screening: (Low Dose CT Chest recommended if Age 72-80ears, 30 pack-year currently smoking OR have quit w/in 15years.) does not qualify.   Lung Cancer Screening Referral: does not qualify   Additional Screening:  Hepatitis C Screening: does qualify; Completed 08/05/2017   Vision Screening: Recommended annual ophthalmology exams for early detection of glaucoma and other disorders of the eye. Is the patient  up to date with their annual eye exam?  Yes  Who is the provider or what is the name of the office in which the patient attends annual eye exams? [facility]  If pt is not established with a provider, would they like to be referred to a provider to establish care? No .   Dental Screening: Recommended annual dental exams for proper oral hygiene  Community Resource Referral / Chronic Care Management: CRR required this visit?  No   CCM required this visit?  No      Plan:     I have personally reviewed and noted the following in the patient's chart:   Medical and social history Use of alcohol, tobacco or illicit drugs  Current medications and supplements including opioid prescriptions.  Functional ability and status Nutritional status Physical activity Advanced directives List of other physicians Hospitalizations, surgeries, and ER visits  in previous 12 months Vitals Screenings to include cognitive, depression, and falls Referrals and appointments  In addition, I have reviewed and discussed with patient certain preventive protocols, quality metrics, and best practice recommendations. A written personalized care plan for preventive services as well as general preventive health recommendations were provided to patient.    [name] , Thank you for taking time to come for your Medicare Wellness Visit. I appreciate your ongoing commitment to your health goals. Please review the following plan we discussed and let me know if I can assist you in the future.   These are the goals we discussed:  Goals      DIET - INCREASE WATER INTAKE     Recommend to drink at least 6-8 8oz glasses of water per day.     Increase physical activity     Pt would like to get back to walking more and exercising overall, unable to go to the gym right now due to covid-19        This is a list of the screening recommended for you and due dates:  Health Maintenance  Topic Date Due   Zoster (Shingles) Vaccine (1 of 2) Never done   COVID-19 Vaccine (6 - Pfizer series) 02/06/2022   Complete foot exam   05/21/2022   Mammogram  06/27/2022   Flu Shot  06/30/2022   Hemoglobin A1C  09/15/2022   Eye exam for diabetics  11/28/2022   Colon Cancer Screening  03/26/2023   Tetanus Vaccine  08/19/2026   Pneumonia Vaccine  Completed   DEXA scan (bone density measurement)  Completed   Hepatitis C Screening: USPSTF Recommendation to screen - Ages 60-79 yo.  Completed   HPV Vaccine  Aged [address], [place]   06/22/2022   Nurse Notes: None

## 2022-06-22 NOTE — Patient Instructions (Signed)

## 2022-06-25 ENCOUNTER — Ambulatory Visit
Admission: RE | Admit: 2022-06-25 | Discharge: 2022-06-25 | Disposition: A | Discharge status: Home / Self Care | Payer: Medicare PPO | Source: Ambulatory Visit | Attending: Family Medicine | Admitting: Family Medicine

## 2022-06-25 DIAGNOSIS — Z1231 Encounter for screening mammogram for malignant neoplasm of breast: Secondary | ICD-10-CM | POA: Diagnosis not present

## 2022-07-16 ENCOUNTER — Ambulatory Visit: Payer: Self-pay

## 2022-07-16 ENCOUNTER — Ambulatory Visit: Payer: Medicare PPO | Admitting: Family Medicine

## 2022-07-16 ENCOUNTER — Encounter: Payer: Self-pay | Admitting: Family Medicine

## 2022-07-16 ENCOUNTER — Other Ambulatory Visit: Payer: Self-pay

## 2022-07-16 VITALS — BP 120/70 | HR 68 | Ht 61.5 in | Wt 181.0 lb

## 2022-07-16 DIAGNOSIS — J4520 Mild intermittent asthma, uncomplicated: Secondary | ICD-10-CM | POA: Diagnosis not present

## 2022-07-16 DIAGNOSIS — M810 Age-related osteoporosis without current pathological fracture: Secondary | ICD-10-CM

## 2022-07-16 DIAGNOSIS — E7800 Pure hypercholesterolemia, unspecified: Secondary | ICD-10-CM

## 2022-07-16 DIAGNOSIS — E119 Type 2 diabetes mellitus without complications: Secondary | ICD-10-CM

## 2022-07-16 DIAGNOSIS — M5412 Radiculopathy, cervical region: Secondary | ICD-10-CM

## 2022-07-16 DIAGNOSIS — M5030 Other cervical disc degeneration, unspecified cervical region: Secondary | ICD-10-CM | POA: Diagnosis not present

## 2022-07-16 DIAGNOSIS — I10 Essential (primary) hypertension: Secondary | ICD-10-CM | POA: Diagnosis not present

## 2022-07-16 MED ORDER — METFORMIN HCL 500 MG PO TABS: ORAL_TABLET | ORAL | 1 refills | Status: DC

## 2022-07-16 MED ORDER — ALBUTEROL SULFATE HFA 108 (90 BASE) MCG/ACT IN AERS: 2.0000 | INHALATION_SPRAY | Freq: Four times a day (QID) | RESPIRATORY_TRACT | 2 refills | Status: DC | PRN

## 2022-07-16 MED ORDER — ALENDRONATE SODIUM 70 MG PO TABS: 70.0000 mg | ORAL_TABLET | ORAL | 0 refills | Status: DC

## 2022-07-16 MED ORDER — ALENDRONATE SODIUM 70 MG PO TABS: ORAL_TABLET | ORAL | 0 refills | Status: DC

## 2022-07-16 MED ORDER — DULOXETINE HCL 30 MG PO CPEP: ORAL_CAPSULE | ORAL | 2 refills | Status: DC

## 2022-07-16 MED ORDER — GLIPIZIDE ER 5 MG PO TB24: 5.0000 mg | ORAL_TABLET | Freq: Every day | ORAL | 1 refills | Status: DC

## 2022-07-16 MED ORDER — PIOGLITAZONE HCL 15 MG PO TABS: 15.0000 mg | ORAL_TABLET | Freq: Every day | ORAL | 1 refills | Status: DC

## 2022-07-16 MED ORDER — LOSARTAN POTASSIUM 25 MG PO TABS: 25.0000 mg | ORAL_TABLET | Freq: Every day | ORAL | 1 refills | Status: DC

## 2022-07-16 MED ORDER — SIMVASTATIN 40 MG PO TABS: 40.0000 mg | ORAL_TABLET | Freq: Every day | ORAL | 0 refills | Status: DC

## 2022-07-16 NOTE — Progress Notes (Signed)
Date:  07/16/2022   Name:  Lindsey George   DOB:  1951/11/23   MRN:  [id number]   Chief Complaint: Diabetes (Foot exam), Hyperlipidemia, Hypertension, Depression, Allergic Rhinitis , and Osteoporosis  Diabetes She presents for her follow-up diabetic visit. She has type 2 diabetes mellitus. Her disease course has been fluctuating. There are no hypoglycemic associated symptoms. Pertinent negatives for hypoglycemia include no headaches. Pertinent negatives for diabetes include no blurred vision, no chest pain, no fatigue, no foot ulcerations, no polydipsia, no polyuria, no visual change and no weight loss. There are no hypoglycemic complications. Symptoms are stable. There are no diabetic complications. Pertinent negatives for diabetic complications include no CVA, PVD or retinopathy. There are no known risk factors for coronary artery disease. Current diabetic treatment includes oral agent (triple therapy). Her weight is stable. She is following a generally healthy diet. Meal planning includes avoidance of concentrated sweets and carbohydrate counting. She participates in exercise intermittently. Her home blood glucose trend is fluctuating minimally. Her breakfast blood glucose range is generally 130-140 mg/dl. Her dinner blood glucose range is generally 140-180 mg/dl. An ACE inhibitor/angiotensin II receptor blocker is being taken.  Hyperlipidemia This is a chronic problem. The current episode started more than 1 year ago. The problem is controlled. Recent lipid tests were reviewed and are normal. She has no history of chronic renal disease, diabetes, hypothyroidism, liver disease, obesity or nephrotic syndrome. Pertinent negatives include no chest pain, focal sensory loss, focal weakness, leg pain, myalgias or shortness of breath. Current antihyperlipidemic treatment includes statins. The current treatment provides moderate improvement of lipids. There are no compliance problems.  Risk factors for  coronary artery disease include dyslipidemia and hypertension.  Hypertension This is a chronic problem. The current episode started more than 1 year ago. The problem has been gradually improving since onset. The problem is controlled. Pertinent negatives include no blurred vision, chest pain, headaches, palpitations, PND or shortness of breath. There are no associated agents to hypertension. Past treatments include angiotensin blockers. The current treatment provides moderate improvement. There are no compliance problems.  There is no history of angina, kidney disease, CAD/MI, CVA, heart failure, left ventricular hypertrophy, PVD or retinopathy. There is no history of chronic renal disease, a hypertension causing med or renovascular disease.  Depression        This is a chronic problem.  The current episode started more than 1 year ago.   The onset quality is gradual.   The problem occurs intermittently.  The problem has been gradually improving since onset.  Associated symptoms include no decreased concentration, no fatigue, no helplessness, no hopelessness, does not have insomnia, not irritable, no restlessness, no decreased interest, no appetite change, no body aches, no myalgias, no headaches, not sad and no suicidal ideas.  Past treatments include SNRIs - Serotonin and norepinephrine reuptake inhibitors.  Past compliance problems include difficulty understanding directions.  Previous treatment provided moderate relief.   Pertinent negatives include no hypothyroidism.   Lab Results  Component Value Date   NA 140 03/16/2022   K 4.4 03/16/2022   CO2 25 03/16/2022   GLUCOSE 124 (H) 03/16/2022   BUN 17 03/16/2022   CREATININE 0.51 (L) 03/16/2022   CALCIUM 10.1 03/16/2022   EGFR 99 03/16/2022   GFRNONAA 92 11/11/2020   Lab Results  Component Value Date   CHOL 130 03/16/2022   HDL 51 03/16/2022   LDLCALC 61 03/16/2022   TRIG 93 03/16/2022   CHOLHDL 2.9 09/19/2018  No results found for:  "TSH" Lab Results  Component Value Date   HGBA1C 7.1 (H) 03/16/2022   Lab Results  Component Value Date   WBC 12.1 (H) 08/11/2018   HGB 12.9 08/11/2018   HCT 40.8 08/11/2018   MCV 80.5 08/11/2018   PLT 394 08/11/2018   Lab Results  Component Value Date   ALT 18 07/21/2021   AST 14 07/21/2021   ALKPHOS 51 07/21/2021   BILITOT 0.3 07/21/2021   No results found for: "25OHVITD2", "25OHVITD3", "VD25OH"   Review of Systems  Constitutional:  Negative for appetite change, fatigue and weight loss.  Eyes:  Negative for blurred vision.  Respiratory:  Negative for shortness of breath.   Cardiovascular:  Negative for chest pain, palpitations and PND.  Endocrine: Negative for polydipsia and polyuria.  Musculoskeletal:  Negative for myalgias.  Neurological:  Negative for focal weakness and headaches.  Psychiatric/Behavioral:  Positive for depression. Negative for decreased concentration and suicidal ideas. The patient does not have insomnia.     Patient Active Problem List   Diagnosis Date Noted   Change in bowel habits 12/10/2021   Pes anserinus bursitis of left knee 10/08/2021   DDD (degenerative disc disease), cervical 10/08/2021   Impingement syndrome of left shoulder 08/18/2021   Hand pain, left 06/26/2021   Loss of memory 06/26/2021   Tremor 06/26/2021   Lumbar stenosis with neurogenic claudication 08/27/2020   Chronic bilateral low back pain with bilateral sciatica 08/27/2020   Essential hypertension 03/06/2020   Cervical radiculopathy 03/06/2020   Asymptomatic varicose veins of both lower extremities 03/06/2020   Morbid obesity ([place]) 03/29/2019   Allergic rhinitis 01/19/2018   Hx of adenomatous colonic polyps 01/14/2018   Reactive airway disease without complication 16/08/9603   Back ache 05/22/2015   Alimentary obesity 05/22/2015   Type 2 diabetes mellitus without complication, without long-term current use of insulin ([place]) 04/12/2014   Hypercholesterolemia 04/12/2014    OP (osteoporosis) 04/12/2014    Allergies  Allergen Reactions   Codeine Other (See Comments)    Abdominal pain    Past Surgical History:  Procedure Laterality Date   ABDOMINAL HYSTERECTOMY     BREAST BIOPSY Right 06/22/11   bx/clip-neg   BREAST CYST ASPIRATION Left    neg   COLONOSCOPY  2014   cleared for 5 yrs- [facility] doc   COLONOSCOPY WITH PROPOFOL N/A 03/25/2018   Procedure: COLONOSCOPY WITH PROPOFOL;  Surgeon: Manya Silvas, MD;  Location: [facility] ENDOSCOPY;  Service: Endoscopy;  Laterality: N/A;   PARTIAL HYSTERECTOMY     SPLENECTOMY      Social History   Tobacco Use   Smoking status: Never   Smokeless tobacco: Never   Tobacco comments:    smoking cessation materials not required  Vaping Use   Vaping Use: Never used  Substance Use Topics   Alcohol use: Not Currently   Drug use: Never     Medication list has been reviewed and updated.  Current Meds  Medication Sig   albuterol (VENTOLIN HFA) 108 (90 Base) MCG/ACT inhaler Inhale 2 puffs into the lungs every 6 (six) hours as needed for wheezing or shortness of breath.   alendronate (FOSAMAX) 70 MG tablet TAKE 1 TABLET BY MOUTH ONCE A WEEK TAKE WITH A FULL GLASS OF WATER ON AN EMPTY STOMACH   aspirin 81 MG tablet Take 81 mg by mouth daily.   calcium-vitamin D (OSCAL WITH D) 500-200 MG-UNIT tablet Take 1 tablet by mouth daily with breakfast.   carbidopa-levodopa (  SINEMET IR) 25-100 MG tablet Take 1 tablet by mouth 3 (three) times daily.   Cholecalciferol (VITAMIN D3 PO) Take 1 capsule by mouth daily.   DULoxetine (CYMBALTA) 30 MG capsule Take 2 capsules daily= 53m a day   EPIPEN 2-PAK 0.3 MG/0.3ML SOAJ injection Inject 0.3 mg into the muscle as needed.   gabapentin (NEURONTIN) 300 MG capsule Take 1 capsule (300 mg total) by mouth 2 (two) times daily. (Patient taking differently: Take 300 mg by mouth daily.)   glipiZIDE (GLIPIZIDE XL) 5 MG 24 hr tablet Take 1 tablet (5 mg total) by mouth daily.   losartan (COZAAR) 25  MG tablet Take 1 tablet (25 mg total) by mouth daily.   metFORMIN (GLUCOPHAGE) 500 MG tablet TAKE ONE TABLET BY MOUTH TWICE DAILY   Multiple Vitamin (MULTIVITAMIN) capsule Take 1 capsule by mouth daily.   ONETOUCH ULTRA test strip USE 1 STRIP TO CHECK GLUCOSE ONCE DAILY   pioglitazone (ACTOS) 15 MG tablet Take 15 mg by mouth daily.   simvastatin (ZOCOR) 40 MG tablet Take 1 tablet by mouth once daily   Current Facility-Administered Medications for the 07/16/22 encounter (Office Visit) with JJuline Patch MD  Medication   ipratropium-albuterol (DUONEB) 0.5-2.5 (3) MG/3ML nebulizer solution 3 mL       07/16/2022   10:52 AM 03/16/2022   10:36 AM 01/06/2022    9:51 AM 11/25/2021   10:35 AM  GAD 7 : Generalized Anxiety Score  Nervous, Anxious, on Edge 0 0 0 0  Control/stop worrying 0 0 0 0  Worry too much - different things 0 0 0 0  Trouble relaxing 0 0 0 0  Restless 0 0 0 0  Easily annoyed or irritable 0 0 0 0  Afraid - awful might happen 0 0 0 0  Total GAD 7 Score 0 0 0 0  Anxiety Difficulty Not difficult at all Not difficult at all  Not difficult at all       07/16/2022   10:52 AM 06/22/2022    9:12 AM 03/16/2022   10:35 AM  Depression screen PHQ 2/9  Decreased Interest 0 0 0  Down, Depressed, Hopeless 0 0 0  PHQ - 2 Score 0 0 0  Altered sleeping 0 0 0  Tired, decreased energy 1 1 0  Change in appetite 0 0 0  Feeling bad or failure about yourself  0 0 0  Trouble concentrating 0 1 0  Moving slowly or fidgety/restless 0 0 0  Suicidal thoughts 0 0 0  PHQ-9 Score 1 2 0  Difficult doing work/chores Not difficult at all Not difficult at all Not difficult at all    BP Readings from Last 3 Encounters:  07/16/22 120/70  06/22/22 106/64  03/16/22 108/62    Physical Exam Vitals and nursing note reviewed.  Constitutional:      General: She is not irritable.    Appearance: She is well-developed.  HENT:     Head: Normocephalic.     Right Ear: Tympanic membrane and external ear  normal.     Left Ear: Tympanic membrane and external ear normal.     Nose: Nose normal.  Eyes:     General: Lids are everted, no foreign bodies appreciated. No scleral icterus.       Right eye: No discharge.        Left eye: No foreign body, discharge or hordeolum.     Conjunctiva/sclera: Conjunctivae normal.     Right eye: Right conjunctiva is  not injected.     Left eye: Left conjunctiva is not injected.     Pupils: Pupils are equal, round, and reactive to light.  Neck:     Thyroid: No thyromegaly.     Vascular: No JVD.     Trachea: No tracheal deviation.  Cardiovascular:     Rate and Rhythm: Normal rate and regular rhythm.     Heart sounds: Normal heart sounds. No murmur heard.    No friction rub. No gallop.  Pulmonary:     Effort: Pulmonary effort is normal. No respiratory distress.     Breath sounds: Normal breath sounds. No wheezing, rhonchi or rales.  Abdominal:     General: Bowel sounds are normal.     Palpations: Abdomen is soft. There is no mass.     Tenderness: There is no abdominal tenderness. There is no guarding or rebound.  Musculoskeletal:        General: No tenderness. Normal range of motion.     Cervical back: Normal range of motion and neck supple.  Lymphadenopathy:     Cervical: No cervical adenopathy.  Skin:    General: Skin is warm.     Findings: No rash.  Neurological:     Mental Status: She is alert and oriented to person, place, and time.     Cranial Nerves: No cranial nerve deficit.     Deep Tendon Reflexes: Reflexes normal.  Psychiatric:        Mood and Affect: Mood is not anxious or depressed.     Wt Readings from Last 3 Encounters:  07/16/22 181 lb (82.1 kg)  06/22/22 179 lb (81.2 kg)  03/16/22 178 lb (80.7 kg)    BP 120/70   Pulse 68   Ht 5' 1.5" (1.562 m)   Wt 181 lb (82.1 kg)   BMI 33.65 kg/m   Assessment and Plan:  1. Type 2 diabetes mellitus without complication, without long-term current use of insulin (HCC) Chronic.   Controlled.  Stable.  Continue glipizide XL 5 mg once a day and pioglitazone 15 mg once a day.  We will check A1c for GFR and lites. - metFORMIN (GLUCOPHAGE) 500 MG tablet; TAKE ONE TABLET BY MOUTH TWICE DAILY  Dispense: 180 tablet; Refill: 1 - glipiZIDE (GLIPIZIDE XL) 5 MG 24 hr tablet; Take 1 tablet (5 mg total) by mouth daily.  Dispense: 90 tablet; Refill: 1 - HgB A1c - Renal Function Panel - pioglitazone (ACTOS) 15 MG tablet; Take 1 tablet (15 mg total) by mouth daily.  Dispense: 90 tablet; Refill: 1  2. Reactive airway disease, mild intermittent, uncomplicated Chronic.  Controlled.  Stable.  Continue 2 puffs every 6 hours as needed - albuterol (VENTOLIN HFA) 108 (90 Base) MCG/ACT inhaler; Inhale 2 puffs into the lungs every 6 (six) hours as needed for wheezing or shortness of breath.  Dispense: 1 each; Refill: 2  3. Age-related osteoporosis without current pathological fracture Controlled.  Stable.  Continue weight. - alendronate (FOSAMAX) 70 MG tablet; Take with a full glass of water on an empty stomach.  Dispense: 12 tablet; Refill: 0  4. Cervical radiculopathy Chronic.  Controlled.  Stable.  Continue duloxetine 30 mg 2 capsules daily. - DULoxetine (CYMBALTA) 30 MG capsule; Take 2 capsules daily= 31m a day  Dispense: 60 capsule; Refill: 2  5. DDD (degenerative disc disease), cervical Chronic.  Controlled.  Stable.  Pain controlled with Cymbalta. - DULoxetine (CYMBALTA) 30 MG capsule; Take 2 capsules daily= 638ma day  Dispense: 60  capsule; Refill: 2  6. Essential hypertension Chronic.  Controlled.  Stable.  Blood pressure 120/70.  Continue losartan today.  We will check renal function panel. - losartan (COZAAR) 25 MG tablet; Take 1 tablet (25 mg total) by mouth daily.  Dispense: 90 tablet; Refill: 1 - Renal Function Panel  7. Hypercholesterolemia Chronic.  Controlled.  Stable.  Diet controlled as well as with simvastatin 40 mg once a day. - simvastatin (ZOCOR) 40 MG tablet;  Take 1 tablet (40 mg total) by mouth daily.  Dispense: 90 tablet; Refill: 0    Otilio Miu, MD

## 2022-07-16 NOTE — Telephone Encounter (Signed)
Called pt left VM that the information was updated on the prescription. Pts name was stated on VM.  KP

## 2022-07-16 NOTE — Telephone Encounter (Signed)
Will need a new Rx sent with take once a week for Fosamax sent today.   Summary: Rx clarification   Mickel Baas from [facility] is requesting clarification on directions for the medication alendronate (FOSAMAX) 70 MG tablet asking if they can add once a week.  Currently directions say Sig: Take with a full glass of water on an empty stomach.   Pharmacy seeking clinical advice.

## 2022-07-17 LAB — RENAL FUNCTION PANEL
Albumin: 4.8 g/dL (ref 3.8–4.8)
BUN/Creatinine Ratio: 29 — ABNORMAL HIGH (ref 12–28)
BUN: 16 mg/dL (ref 8–27)
CO2: 25 mmol/L (ref 20–29)
Calcium: 10 mg/dL (ref 8.7–10.3)
Chloride: 98 mmol/L (ref 96–106)
Creatinine, Ser: 0.56 mg/dL — ABNORMAL LOW (ref 0.57–1.00)
Glucose: 120 mg/dL — ABNORMAL HIGH (ref 70–99)
Phosphorus: 4.2 mg/dL (ref 3.0–4.3)
Potassium: 4.8 mmol/L (ref 3.5–5.2)
Sodium: 138 mmol/L (ref 134–144)
eGFR: 97 mL/min/{1.73_m2} (ref >59)

## 2022-07-17 LAB — HEMOGLOBIN A1C
Est. average glucose Bld gHb Est-mCnc: 143 mg/dL
Hgb A1c MFr Bld: 6.6 % — ABNORMAL HIGH (ref 4.8–5.6)

## 2022-07-21 DIAGNOSIS — G20 Parkinson's disease: Secondary | ICD-10-CM | POA: Diagnosis not present

## 2022-07-21 DIAGNOSIS — G629 Polyneuropathy, unspecified: Secondary | ICD-10-CM | POA: Diagnosis not present

## 2022-07-21 DIAGNOSIS — E538 Deficiency of other specified B group vitamins: Secondary | ICD-10-CM | POA: Diagnosis not present

## 2022-07-21 DIAGNOSIS — M5136 Other intervertebral disc degeneration, lumbar region: Secondary | ICD-10-CM | POA: Diagnosis not present

## 2022-07-31 ENCOUNTER — Other Ambulatory Visit: Payer: Self-pay | Admitting: Family Medicine

## 2022-07-31 DIAGNOSIS — M5030 Other cervical disc degeneration, unspecified cervical region: Secondary | ICD-10-CM

## 2022-07-31 DIAGNOSIS — M5412 Radiculopathy, cervical region: Secondary | ICD-10-CM

## 2022-08-04 NOTE — Telephone Encounter (Signed)
Requested Prescriptions  Pending Prescriptions Disp Refills  . gabapentin (NEURONTIN) 300 MG capsule [Pharmacy Med Name: Gabapentin 300 MG Oral Capsule] 60 capsule 0    Sig: Take 1 capsule by mouth twice daily     Neurology: Anticonvulsants - gabapentin Failed - 07/31/2022  9:55 PM      Failed - Cr in normal range and within 360 days    Creatinine  Date Value Ref Range Status  11/26/2013 0.66 0.60 - 1.30 mg/dL Final   Creatinine, Ser  Date Value Ref Range Status  07/16/2022 0.56 (L) 0.57 - 1.00 mg/dL Final         Passed - Completed PHQ-2 or PHQ-9 in the last 360 days      Passed - Valid encounter within last 12 months    Recent Outpatient Visits          2 weeks ago Type 2 diabetes mellitus without complication, without long-term current use of insulin ([place])   [facility] [place], [place], MD   4 months ago Chest pain, unspecified type   [facility] [place], Deanna C, MD   6 months ago Acute maxillary sinusitis, recurrence not specified   [facility] [place], [place], MD   7 months ago Cervical radiculopathy   [place] [facility], Earley Abide, MD   8 months ago Cervical radiculopathy   [facility] at [facility] [place], Earley Abide, MD      Future Appointments            In 3 months Juline Patch, MD [facility] at [facility], [facility]

## 2022-08-21 DIAGNOSIS — E538 Deficiency of other specified B group vitamins: Secondary | ICD-10-CM | POA: Diagnosis not present

## 2022-08-24 DIAGNOSIS — D2262 Melanocytic nevi of left upper limb, including shoulder: Secondary | ICD-10-CM | POA: Diagnosis not present

## 2022-08-24 DIAGNOSIS — D2272 Melanocytic nevi of left lower limb, including hip: Secondary | ICD-10-CM | POA: Diagnosis not present

## 2022-08-24 DIAGNOSIS — D2261 Melanocytic nevi of right upper limb, including shoulder: Secondary | ICD-10-CM | POA: Diagnosis not present

## 2022-08-24 DIAGNOSIS — D225 Melanocytic nevi of trunk: Secondary | ICD-10-CM | POA: Diagnosis not present

## 2022-08-24 DIAGNOSIS — L821 Other seborrheic keratosis: Secondary | ICD-10-CM | POA: Diagnosis not present

## 2022-08-24 DIAGNOSIS — D2271 Melanocytic nevi of right lower limb, including hip: Secondary | ICD-10-CM | POA: Diagnosis not present

## 2022-09-02 DIAGNOSIS — Z124 Encounter for screening for malignant neoplasm of cervix: Secondary | ICD-10-CM | POA: Diagnosis not present

## 2022-09-02 DIAGNOSIS — Z1231 Encounter for screening mammogram for malignant neoplasm of breast: Secondary | ICD-10-CM | POA: Diagnosis not present

## 2022-09-03 DIAGNOSIS — G20A2 Parkinson's disease without dyskinesia, with fluctuations: Secondary | ICD-10-CM | POA: Diagnosis not present

## 2022-09-03 DIAGNOSIS — G629 Polyneuropathy, unspecified: Secondary | ICD-10-CM | POA: Diagnosis not present

## 2022-09-03 DIAGNOSIS — R441 Visual hallucinations: Secondary | ICD-10-CM | POA: Diagnosis not present

## 2022-09-15 ENCOUNTER — Other Ambulatory Visit: Payer: Self-pay

## 2022-09-16 ENCOUNTER — Other Ambulatory Visit: Payer: Self-pay

## 2022-09-16 MED ORDER — COMIRNATY 30 MCG/0.3ML IM SUSP
INTRAMUSCULAR | 0 refills | Status: DC
  Filled 2022-09-18: qty 0.3, 1d supply, fill #0

## 2022-09-16 MED ORDER — FLUAD QUADRIVALENT 0.5 ML IM PRSY
PREFILLED_SYRINGE | INTRAMUSCULAR | 0 refills | Status: DC
  Filled 2022-09-18: qty 0.5, 1d supply, fill #0

## 2022-09-18 ENCOUNTER — Other Ambulatory Visit: Payer: Self-pay

## 2022-09-18 MED ORDER — COVID-19 MRNA 2023-2024 VACCINE (COMIRNATY) 0.3 ML INJECTION
INTRAMUSCULAR | 0 refills | Status: DC
  Filled 2022-09-18: qty 0.3, 1d supply, fill #0

## 2022-09-20 ENCOUNTER — Other Ambulatory Visit: Payer: Self-pay

## 2022-09-21 ENCOUNTER — Other Ambulatory Visit: Payer: Self-pay

## 2022-09-21 DIAGNOSIS — E538 Deficiency of other specified B group vitamins: Secondary | ICD-10-CM | POA: Diagnosis not present

## 2022-09-22 ENCOUNTER — Other Ambulatory Visit: Payer: Self-pay

## 2022-09-28 DIAGNOSIS — E538 Deficiency of other specified B group vitamins: Secondary | ICD-10-CM | POA: Diagnosis not present

## 2022-10-09 ENCOUNTER — Other Ambulatory Visit (HOSPITAL_BASED_OUTPATIENT_CLINIC_OR_DEPARTMENT_OTHER): Payer: Self-pay

## 2022-10-27 ENCOUNTER — Ambulatory Visit: Payer: Medicare PPO | Admitting: Family Medicine

## 2022-10-27 ENCOUNTER — Telehealth: Payer: Self-pay | Admitting: Family Medicine

## 2022-10-27 ENCOUNTER — Encounter: Payer: Self-pay | Admitting: Family Medicine

## 2022-10-27 VITALS — BP 122/82 | HR 92 | Ht 63.0 in | Wt 187.0 lb

## 2022-10-27 DIAGNOSIS — R051 Acute cough: Secondary | ICD-10-CM | POA: Diagnosis not present

## 2022-10-27 LAB — POC COVID19 BINAXNOW: SARS Coronavirus 2 Ag: NEGATIVE

## 2022-10-27 NOTE — Patient Instructions (Addendum)
-   Use Flonase twice daily x 7 days then as-needed - Use Mucinex 12 hour twice daily x 7 days then as-needed - Use Rx cough syrup as-needed - Follow-up as discussed

## 2022-10-27 NOTE — Telephone Encounter (Signed)
Copied from [place] [id number]. Topic: Appointment Scheduling - Scheduling Inquiry for Clinic >> Oct 27, 2022  8:51 AM Lindsey George wrote: Patient has cough, and the more she cough her chest hurt. Patient would like to see Dr. Ronnald Ramp only. PCP has no available appointments

## 2022-11-02 DIAGNOSIS — M5136 Other intervertebral disc degeneration, lumbar region: Secondary | ICD-10-CM | POA: Diagnosis not present

## 2022-11-02 DIAGNOSIS — G629 Polyneuropathy, unspecified: Secondary | ICD-10-CM | POA: Diagnosis not present

## 2022-11-02 DIAGNOSIS — E119 Type 2 diabetes mellitus without complications: Secondary | ICD-10-CM | POA: Diagnosis not present

## 2022-11-02 DIAGNOSIS — G20A2 Parkinson's disease without dyskinesia, with fluctuations: Secondary | ICD-10-CM | POA: Diagnosis not present

## 2022-11-02 DIAGNOSIS — E538 Deficiency of other specified B group vitamins: Secondary | ICD-10-CM | POA: Diagnosis not present

## 2022-11-02 DIAGNOSIS — G939 Disorder of brain, unspecified: Secondary | ICD-10-CM | POA: Diagnosis not present

## 2022-11-04 ENCOUNTER — Ambulatory Visit
Admission: RE | Admit: 2022-11-04 | Discharge: 2022-11-04 | Disposition: A | Discharge status: Home / Self Care | Payer: Medicare PPO | Attending: Family Medicine | Admitting: Family Medicine

## 2022-11-04 ENCOUNTER — Ambulatory Visit: Payer: Medicare PPO | Admitting: Family Medicine

## 2022-11-04 ENCOUNTER — Ambulatory Visit
Admission: RE | Admit: 2022-11-04 | Discharge: 2022-11-04 | Disposition: A | Discharge status: Home / Self Care | Payer: Medicare PPO | Source: Ambulatory Visit | Attending: Family Medicine | Admitting: Family Medicine

## 2022-11-04 ENCOUNTER — Encounter: Payer: Self-pay | Admitting: Family Medicine

## 2022-11-04 VITALS — BP 116/70 | HR 88 | Temp 98.7°F | Ht 63.0 in | Wt 182.0 lb

## 2022-11-04 DIAGNOSIS — R310 Gross hematuria: Secondary | ICD-10-CM | POA: Diagnosis not present

## 2022-11-04 DIAGNOSIS — M25531 Pain in right wrist: Secondary | ICD-10-CM | POA: Diagnosis not present

## 2022-11-04 DIAGNOSIS — R051 Acute cough: Secondary | ICD-10-CM

## 2022-11-04 DIAGNOSIS — Z043 Encounter for examination and observation following other accident: Secondary | ICD-10-CM | POA: Diagnosis not present

## 2022-11-04 DIAGNOSIS — N3090 Cystitis, unspecified without hematuria: Secondary | ICD-10-CM

## 2022-11-04 LAB — POCT URINALYSIS DIPSTICK
Bilirubin, UA: NEGATIVE
Glucose, UA: NEGATIVE
Ketones, UA: NEGATIVE
Nitrite, UA: NEGATIVE
Protein, UA: POSITIVE — AB
Spec Grav, UA: 1.02 (ref 1.010–1.025)
Urobilinogen, UA: 0.2 E.U./dL
pH, UA: 6 (ref 5.0–8.0)

## 2022-11-04 MED ORDER — PROMETHAZINE-DM 6.25-15 MG/5ML PO SYRP: 5.0000 mL | ORAL_SOLUTION | Freq: Four times a day (QID) | ORAL | 0 refills | Status: DC | PRN

## 2022-11-04 MED ORDER — CIPROFLOXACIN HCL 100 MG PO TABS: 100.0000 mg | ORAL_TABLET | Freq: Two times a day (BID) | ORAL | 0 refills | Status: DC

## 2022-11-04 NOTE — Progress Notes (Signed)
Date:  11/04/2022   Name:  Lindsey George   DOB:  1951/03/12   MRN:  [id number]   Chief Complaint: Cough (Clear/ blood tinged phlegm, some cough) and Hand Pain (Sore s/p catching self when fell on 11/18)  Cough This is a new problem. The current episode started in the past 7 days. The problem has been unchanged. Associated symptoms include hemoptysis, nasal congestion, postnasal drip and rhinorrhea. Pertinent negatives include no chest pain, chills, ear congestion, ear pain, fever, headaches, heartburn, myalgias, rash, sore throat, shortness of breath, sweats or wheezing. The treatment provided moderate relief.  Hand Pain  The incident occurred more than 1 week ago (fall nov 18th). The injury mechanism was a fall (november). The pain is present in the right wrist. The quality of the pain is described as aching. The pain is at a severity of 4/10. The pain is moderate. Pertinent negatives include no chest pain, muscle weakness, numbness or tingling. Nothing aggravates the symptoms. The treatment provided mild relief.  Hematuria This is a chronic problem. The current episode started today. She describes the hematuria as gross hematuria. The pain is moderate. Irritative symptoms do not include frequency, nocturia or urgency. Pertinent negatives include no bladder pain, chills, dysuria or fever.    Lab Results  Component Value Date   NA 138 07/16/2022   K 4.8 07/16/2022   CO2 25 07/16/2022   GLUCOSE 120 (H) 07/16/2022   BUN 16 07/16/2022   CREATININE 0.56 (L) 07/16/2022   CALCIUM 10.0 07/16/2022   EGFR 97 07/16/2022   GFRNONAA 92 11/11/2020   Lab Results  Component Value Date   CHOL 130 03/16/2022   HDL 51 03/16/2022   LDLCALC 61 03/16/2022   TRIG 93 03/16/2022   CHOLHDL 2.9 09/19/2018   No results found for: "TSH" Lab Results  Component Value Date   HGBA1C 6.6 (H) 07/16/2022   Lab Results  Component Value Date   WBC 12.1 (H) 08/11/2018   HGB 12.9 08/11/2018   HCT 40.8  08/11/2018   MCV 80.5 08/11/2018   PLT 394 08/11/2018   Lab Results  Component Value Date   ALT 18 07/21/2021   AST 14 07/21/2021   ALKPHOS 51 07/21/2021   BILITOT 0.3 07/21/2021   No results found for: "25OHVITD2", "25OHVITD3", "VD25OH"   Review of Systems  Constitutional:  Negative for chills and fever.  HENT:  Positive for postnasal drip and rhinorrhea. Negative for ear pain and sore throat.   Respiratory:  Positive for cough and hemoptysis. Negative for shortness of breath and wheezing.   Cardiovascular:  Negative for chest pain.  Gastrointestinal:  Negative for heartburn.  Genitourinary:  Positive for hematuria. Negative for dysuria, frequency, nocturia and urgency.  Musculoskeletal:  Negative for myalgias.  Skin:  Negative for rash.  Neurological:  Negative for tingling, numbness and headaches.    Patient Active Problem List   Diagnosis Date Noted   Acute cough 11/04/2022   Reactive airway disease, mild intermittent, uncomplicated 88/41/6606   Change in bowel habits 12/10/2021   Pes anserinus bursitis of left knee 10/08/2021   DDD (degenerative disc disease), cervical 10/08/2021   Impingement syndrome of left shoulder 08/18/2021   Hand pain, left 06/26/2021   Loss of memory 06/26/2021   Tremor 06/26/2021   Lumbar stenosis with neurogenic claudication 08/27/2020   Chronic bilateral low back pain with bilateral sciatica 08/27/2020   Essential hypertension 03/06/2020   Cervical radiculopathy 03/06/2020   Asymptomatic varicose veins of both  lower extremities 03/06/2020   Morbid obesity ([place]) 03/29/2019   Allergic rhinitis 01/19/2018   Hx of adenomatous colonic polyps 01/14/2018   Reactive airway disease without complication 70/26/3785   Back ache 05/22/2015   Alimentary obesity 05/22/2015   Type 2 diabetes mellitus without complication, without long-term current use of insulin ([place]) 04/12/2014   Hypercholesterolemia 04/12/2014   OP (osteoporosis) 04/12/2014     Allergies  Allergen Reactions   Codeine Other (See Comments)    Abdominal pain    Past Surgical History:  Procedure Laterality Date   ABDOMINAL HYSTERECTOMY     BREAST BIOPSY Right 06/22/11   bx/clip-neg   BREAST CYST ASPIRATION Left    neg   COLONOSCOPY  2014   cleared for 5 yrs- [place] doc   COLONOSCOPY WITH PROPOFOL N/A 03/25/2018   Procedure: COLONOSCOPY WITH PROPOFOL;  Surgeon: Manya Silvas, MD;  Location: [facility] ENDOSCOPY;  Service: Endoscopy;  Laterality: N/A;   PARTIAL HYSTERECTOMY     SPLENECTOMY      Social History   Tobacco Use   Smoking status: Never   Smokeless tobacco: Never   Tobacco comments:    smoking cessation materials not required  Vaping Use   Vaping Use: Never used  Substance Use Topics   Alcohol use: Not Currently   Drug use: Never     Medication list has been reviewed and updated.  Current Meds  Medication Sig   albuterol (VENTOLIN HFA) 108 (90 Base) MCG/ACT inhaler Inhale 2 puffs into the lungs every 6 (six) hours as needed for wheezing or shortness of breath.   alendronate (FOSAMAX) 70 MG tablet Take 1 tablet (70 mg total) by mouth once a week. Take with a full glass of water on an empty stomach.   Alpha-Lipoic Acid (LIPOIC ACID PO) Take 600 mg by mouth daily.   aspirin 81 MG tablet Take 81 mg by mouth daily.   calcium-vitamin D (OSCAL WITH D) 500-200 MG-UNIT tablet Take 1 tablet by mouth daily with breakfast.   carbidopa-levodopa (SINEMET IR) 25-100 MG tablet Take 1 tablet by mouth 3 (three) times daily.   Cholecalciferol (VITAMIN D3 PO) Take 1 capsule by mouth daily.   DULoxetine (CYMBALTA) 30 MG capsule Take 2 capsules daily= 53m a day   EPIPEN 2-PAK 0.3 MG/0.3ML SOAJ injection Inject 0.3 mg into the muscle as needed.   gabapentin (NEURONTIN) 300 MG capsule Take 1 capsule by mouth twice daily   glipiZIDE (GLIPIZIDE XL) 5 MG 24 hr tablet Take 1 tablet (5 mg total) by mouth daily.   influenza vaccine adjuvanted (FLUAD QUADRIVALENT)  0.5 ML injection Inject into the muscle.   losartan (COZAAR) 25 MG tablet Take 1 tablet (25 mg total) by mouth daily.   metFORMIN (GLUCOPHAGE) 500 MG tablet TAKE ONE TABLET BY MOUTH TWICE DAILY   Multiple Vitamin (MULTIVITAMIN) capsule Take 1 capsule by mouth daily.   ONETOUCH ULTRA test strip USE 1 STRIP TO CHECK GLUCOSE ONCE DAILY   pioglitazone (ACTOS) 15 MG tablet Take 1 tablet (15 mg total) by mouth daily.   promethazine-dextromethorphan (PROMETHAZINE-DM) 6.25-15 MG/5ML syrup Take 5 mLs by mouth 4 (four) times daily as needed for cough.   simvastatin (ZOCOR) 40 MG tablet Take 1 tablet (40 mg total) by mouth daily.   Current Facility-Administered Medications for the 11/04/22 encounter (Office Visit) with JJuline Patch MD  Medication   ipratropium-albuterol (DUONEB) 0.5-2.5 (3) MG/3ML nebulizer solution 3 mL       07/16/2022   10:52 AM 03/16/2022  10:36 AM 01/06/2022    9:51 AM 11/25/2021   10:35 AM  GAD 7 : Generalized Anxiety Score  Nervous, Anxious, on Edge 0 0 0 0  Control/stop worrying 0 0 0 0  Worry too much - different things 0 0 0 0  Trouble relaxing 0 0 0 0  Restless 0 0 0 0  Easily annoyed or irritable 0 0 0 0  Afraid - awful might happen 0 0 0 0  Total GAD 7 Score 0 0 0 0  Anxiety Difficulty Not difficult at all Not difficult at all  Not difficult at all       07/16/2022   10:52 AM 06/22/2022    9:12 AM 03/16/2022   10:35 AM  Depression screen PHQ 2/9  Decreased Interest 0 0 0  Down, Depressed, Hopeless 0 0 0  PHQ - 2 Score 0 0 0  Altered sleeping 0 0 0  Tired, decreased energy 1 1 0  Change in appetite 0 0 0  Feeling bad or failure about yourself  0 0 0  Trouble concentrating 0 1 0  Moving slowly or fidgety/restless 0 0 0  Suicidal thoughts 0 0 0  PHQ-9 Score 1 2 0  Difficult doing work/chores Not difficult at all Not difficult at all Not difficult at all    BP Readings from Last 3 Encounters:  11/04/22 116/70  10/27/22 122/82  07/16/22 120/70     Physical Exam Vitals and nursing note reviewed.  Constitutional:      Appearance: She is well-developed.  HENT:     Head: Normocephalic.     Right Ear: External ear normal.     Left Ear: External ear normal.  Eyes:     General: Lids are everted, no foreign bodies appreciated. No scleral icterus.       Left eye: No foreign body or hordeolum.     Conjunctiva/sclera: Conjunctivae normal.     Right eye: Right conjunctiva is not injected.     Left eye: Left conjunctiva is not injected.     Pupils: Pupils are equal, round, and reactive to light.  Neck:     Thyroid: No thyromegaly.     Vascular: No JVD.     Trachea: No tracheal deviation.  Cardiovascular:     Rate and Rhythm: Normal rate and regular rhythm.     Heart sounds: Normal heart sounds. No murmur heard.    No friction rub. No gallop.  Pulmonary:     Effort: Pulmonary effort is normal. No respiratory distress.     Breath sounds: Normal breath sounds. No decreased breath sounds, wheezing, rhonchi or rales.  Abdominal:     General: Bowel sounds are normal.     Palpations: Abdomen is soft. There is no mass.     Tenderness: There is no abdominal tenderness. There is no right CVA tenderness, left CVA tenderness, guarding or rebound.  Musculoskeletal:        General: No tenderness. Normal range of motion.     Cervical back: Normal range of motion and neck supple.  Lymphadenopathy:     Cervical: No cervical adenopathy.  Skin:    General: Skin is warm.     Findings: No rash.  Neurological:     Mental Status: She is alert and oriented to person, place, and time.     Cranial Nerves: No cranial nerve deficit.     Deep Tendon Reflexes: Reflexes normal.  Psychiatric:        Mood and  Affect: Mood is not anxious or depressed.     Wt Readings from Last 3 Encounters:  11/04/22 182 lb (82.6 kg)  10/27/22 187 lb (84.8 kg)  07/16/22 181 lb (82.1 kg)    BP 116/70   Pulse 88   Temp 98.7 F (37.1 C) (Oral)   Ht _0  (1.6 m)    Wt 182 lb (82.6 kg)   SpO2 95%   BMI 32.24 kg/m   Assessment and Plan:  1. Wrist pain, acute, right New onset.  Status post fall.  Pain has been present since November and is over the styloid process of the right wrist.  Patient has complete range of motion without palpable defect.  There is palpable tenderness over the styloid process.  We will obtain an x-ray of the wrist to see if there is any evidence of fracture or avulsion. - DG Wrist Complete Right; Future  2. Gross hematuria New onset.  Patient awoke this morning and noted hematuria.  On examination of the urine it is consistent with a cystitis with the presence of leukocytes and erythrocytes.   3. Cystitis New onset.  Persistent.  There is no CVA or suprapubic tenderness.  Will treat with ciprofloxacin 1 tablet twice a day for 7 days. - ciprofloxacin (CIPRO) 100 MG tablet; Take 1 tablet (100 mg total) by mouth 2 (two) times daily.  Dispense: 14 tablet; Refill: 0 - POCT urinalysis dipstick  4. Acute cough Patient continues to have a cough which is probably bronchiolitic in nature.  We will prescribe promethazine with dextromethorphan for cough suppression and patient has been encouraged to drink fluids to maintain sputum production. - promethazine-dextromethorphan (PROMETHAZINE-DM) 6.25-15 MG/5ML syrup; Take 5 mLs by mouth 4 (four) times daily as needed for cough.  Dispense: 118 mL; Refill: 0    Otilio Miu, MD

## 2022-11-04 NOTE — Progress Notes (Signed)
     Primary Care / Sports Medicine Office Visit  Patient Information:  Patient ID: Lindsey George, female DOB: 10-07-1951 Age: 71 y.o. MRN: [id number]   Lindsey George is a pleasant 71 y.o. female presenting with the following:  Chief Complaint  Patient presents with   Cough    Thick white production, no fever    Vitals:   10/27/22 1534  BP: 122/82  Pulse: 92  SpO2: 94%   Vitals:   10/27/22 1534  Weight: 187 lb (84.8 kg)  Height: '5\' 3"'$  (1.6 m)   Body mass index is 33.13 kg/m.  No results found.   Independent interpretation of notes and tests performed by another provider:   None  Procedures performed:   None  Pertinent History, Exam, Impression, and Recommendations:   Problem List Items Addressed This Visit       Other   Acute cough - Primary    Patient with few day history of cough, whitish sputum, no SOA, no fevers, chills. Examination consistent with allergic rhinitis, plan for supportive care, close follow-up if suboptimal progress with medications.      Relevant Orders   POC COVID-19 (Completed)     Orders & Medications Meds ordered this encounter  Medications   promethazine-dextromethorphan (PROMETHAZINE-DM) 6.25-15 MG/5ML syrup    Sig: Take 5 mLs by mouth 4 (four) times daily as needed for cough.    Dispense:  118 mL    Refill:  0   Orders Placed This Encounter  Procedures   POC COVID-19     Return in about 1 week (around 11/03/2022).     Montel Culver, MD, [facility]   Primary Care Sports Medicine Primary Care and Sports Medicine at [facility]

## 2022-11-04 NOTE — Assessment & Plan Note (Signed)
Patient with few day history of cough, whitish sputum, no SOA, no fevers, chills. Examination consistent with allergic rhinitis, plan for supportive care, close follow-up if suboptimal progress with medications.

## 2022-11-05 ENCOUNTER — Telehealth: Payer: Self-pay | Admitting: Family Medicine

## 2022-11-05 NOTE — Telephone Encounter (Signed)
Copied from [place] [id number]. Topic: General - Other >> Nov 05, 2022  3:42 PM Oley Balm E wrote: Reason for CRM: Pt wants to speak to Baxter Flattery regarding her Xray results, please advise

## 2022-11-10 ENCOUNTER — Emergency Department: Payer: Medicare PPO

## 2022-11-10 ENCOUNTER — Other Ambulatory Visit: Payer: Self-pay

## 2022-11-10 ENCOUNTER — Encounter: Payer: Self-pay | Admitting: Emergency Medicine

## 2022-11-10 ENCOUNTER — Encounter: Payer: Self-pay | Admitting: Family Medicine

## 2022-11-10 ENCOUNTER — Ambulatory Visit: Payer: Medicare PPO | Admitting: Family Medicine

## 2022-11-10 ENCOUNTER — Emergency Department
Admission: EM | Admit: 2022-11-10 | Discharge: 2022-11-10 | Disposition: A | Discharge status: Home / Self Care | Payer: Medicare PPO | Attending: Emergency Medicine | Admitting: Emergency Medicine

## 2022-11-10 VITALS — BP 120/76 | HR 81 | Ht 63.0 in | Wt 182.0 lb

## 2022-11-10 DIAGNOSIS — M5416 Radiculopathy, lumbar region: Secondary | ICD-10-CM | POA: Diagnosis not present

## 2022-11-10 DIAGNOSIS — M79604 Pain in right leg: Secondary | ICD-10-CM | POA: Diagnosis not present

## 2022-11-10 DIAGNOSIS — E119 Type 2 diabetes mellitus without complications: Secondary | ICD-10-CM | POA: Insufficient documentation

## 2022-11-10 DIAGNOSIS — M545 Low back pain, unspecified: Secondary | ICD-10-CM | POA: Diagnosis not present

## 2022-11-10 MED ORDER — LIDOCAINE 5 % EX PTCH
1.0000 | MEDICATED_PATCH | CUTANEOUS | Status: DC
  Administered 2022-11-10: 1 via TRANSDERMAL
  Filled 2022-11-10: qty 1

## 2022-11-10 MED ORDER — CYCLOBENZAPRINE HCL 10 MG PO TABS
10.0000 mg | ORAL_TABLET | Freq: Once | ORAL | Status: AC
  Administered 2022-11-10: 10 mg via ORAL
  Filled 2022-11-10: qty 1

## 2022-11-10 MED ORDER — BACLOFEN 10 MG PO TABS: 10.0000 mg | ORAL_TABLET | Freq: Three times a day (TID) | ORAL | 0 refills | Status: AC

## 2022-11-10 MED ORDER — OXYCODONE-ACETAMINOPHEN 5-325 MG PO TABS: 1.0000 | ORAL_TABLET | ORAL | 0 refills | Status: DC | PRN

## 2022-11-10 MED ORDER — METHYLPREDNISOLONE 4 MG PO TBPK: ORAL_TABLET | ORAL | 0 refills | Status: DC

## 2022-11-10 MED ORDER — OXYCODONE-ACETAMINOPHEN 5-325 MG PO TABS
1.0000 | ORAL_TABLET | Freq: Once | ORAL | Status: AC
  Administered 2022-11-10: 1 via ORAL
  Filled 2022-11-10: qty 1

## 2022-11-10 NOTE — ED Provider Notes (Signed)
[facility] Provider Note    Event Date/Time   First MD Initiated Contact with Patient 11/10/22 1503     (approximate)   History   Leg Pain   HPI  Lindsey George is a 71 y.o. female with history of diabetes, osteoporosis, hyperlipidemia and chronic back pain presents emergency department complaining of right-sided lower back pain that radiates to the right leg.  States it has gotten worse over the past week.  Used her gabapentin without any relief.  No known injury.  No fever or chills.  No loss of bowel or bladder control      Physical Exam   Triage Vital Signs: ED Triage Vitals  Enc Vitals Group     BP 11/10/22 1211 132/65     Pulse Rate 11/10/22 1211 85     Resp 11/10/22 1211 18     Temp 11/10/22 1211 98 F (36.7 C)     Temp src --      SpO2 11/10/22 1211 94 %     Weight --      Height --      Head Circumference --      Peak Flow --      Pain Score 11/10/22 1206 10     Pain Loc --      Pain Edu? --      Excl. in [place]? --     Most recent vital signs: Vitals:   11/10/22 1211  BP: 132/65  Pulse: 85  Resp: 18  Temp: 98 F (36.7 C)  SpO2: 94%     General: Awake, no distress.   CV:  Good peripheral perfusion. regular rate and  rhythm Resp:  Normal effort.  Abd:  No distention.   Other:  SI joint extremely tender on the right side, patient is able to ambulate without foot drop, 5 or 5 strength in lower extremities bilaterally   ED Results / Procedures / Treatments   Labs (all labs ordered are listed, but only abnormal results are displayed) Labs Reviewed - No data to display   EKG     RADIOLOGY CT lumbar spine    PROCEDURES:   Procedures   MEDICATIONS ORDERED IN ED: Medications  oxyCODONE-acetaminophen (PERCOCET/ROXICET) 5-325 MG per tablet 1 tablet (has no administration in time range)  cyclobenzaprine (FLEXERIL) tablet 10 mg (has no administration in time range)  lidocaine (LIDODERM) 5 % 1 patch (has no  administration in time range)     IMPRESSION / MDM / ASSESSMENT AND PLAN / ED COURSE  I reviewed the triage vital signs and the nursing notes.                              Differential diagnosis includes, but is not limited to, sciatica, bulging disc, nerve impingement, cauda equina  Patient's presentation is most consistent with acute complicated illness / injury requiring diagnostic workup.   Patient does not have symptoms of cauda equina so feel this is less likely, CT of the lumbar spine independently reviewed and interpreted by me as having a bulging disc which could cause nerve impingement.  Did discuss these findings with patient.  Will do conservative therapy at this time.  Will do a trial of a Medrol Dosepak, baclofen, and pain medication.  She was given pain medication and a Lidoderm patch here in the ED prior to discharge.  She is to follow-up with Dr. Cari Caraway if not improving  in 3 days.  Return emergency department if worsening.  We did discuss signs and symptoms of cauda equina and need for emergent return to the emergency department.  She is in agreement treatment plan.  She was discharged stable condition.      FINAL CLINICAL IMPRESSION(S) / ED DIAGNOSES   Final diagnoses:  Lumbar radiculopathy     Rx / DC Orders   ED Discharge Orders          Ordered    methylPREDNISolone (MEDROL DOSEPAK) 4 MG TBPK tablet        11/10/22 1525    baclofen (LIORESAL) 10 MG tablet  3 times daily        11/10/22 1525    oxyCODONE-acetaminophen (PERCOCET) 5-325 MG tablet  Every 4 hours PRN        11/10/22 1525             Note:  This document was prepared using Dragon voice recognition software and may include unintentional dictation errors.    Versie Starks, PA-C 11/10/22 1759    Naaman Plummer, MD 11/10/22 2010

## 2022-11-10 NOTE — ED Triage Notes (Signed)
Pt comes with c/o right leg pain for about 2 days. Pt states it has gotten worse. Pt denies any recent new injuries.   Pt states pain that is in lower back and radiates down. Pt states pcp thinks might be ruptured disc.

## 2022-11-10 NOTE — Progress Notes (Signed)
Date:  11/10/2022   Name:  Lindsey George   DOB:  1951/08/23   MRN:  [id number]   Chief Complaint: Back Pain  Back Pain    Lab Results  Component Value Date   NA 138 07/16/2022   K 4.8 07/16/2022   CO2 25 07/16/2022   GLUCOSE 120 (H) 07/16/2022   BUN 16 07/16/2022   CREATININE 0.56 (L) 07/16/2022   CALCIUM 10.0 07/16/2022   EGFR 97 07/16/2022   GFRNONAA 92 11/11/2020   Lab Results  Component Value Date   CHOL 130 03/16/2022   HDL 51 03/16/2022   LDLCALC 61 03/16/2022   TRIG 93 03/16/2022   CHOLHDL 2.9 09/19/2018   No results found for: "TSH" Lab Results  Component Value Date   HGBA1C 6.6 (H) 07/16/2022   Lab Results  Component Value Date   WBC 12.1 (H) 08/11/2018   HGB 12.9 08/11/2018   HCT 40.8 08/11/2018   MCV 80.5 08/11/2018   PLT 394 08/11/2018   Lab Results  Component Value Date   ALT 18 07/21/2021   AST 14 07/21/2021   ALKPHOS 51 07/21/2021   BILITOT 0.3 07/21/2021   No results found for: "25OHVITD2", "25OHVITD3", "VD25OH"   Review of Systems  Musculoskeletal:  Positive for back pain.    Patient Active Problem List   Diagnosis Date Noted   Acute cough 11/04/2022   Reactive airway disease, mild intermittent, uncomplicated 32/35/5732   Change in bowel habits 12/10/2021   Pes anserinus bursitis of left knee 10/08/2021   DDD (degenerative disc disease), cervical 10/08/2021   Impingement syndrome of left shoulder 08/18/2021   Hand pain, left 06/26/2021   Loss of memory 06/26/2021   Tremor 06/26/2021   Lumbar stenosis with neurogenic claudication 08/27/2020   Chronic bilateral low back pain with bilateral sciatica 08/27/2020   Essential hypertension 03/06/2020   Cervical radiculopathy 03/06/2020   Asymptomatic varicose veins of both lower extremities 03/06/2020   Morbid obesity ([place]) 03/29/2019   Allergic rhinitis 01/19/2018   Hx of adenomatous colonic polyps 01/14/2018   Reactive airway disease without complication 20/25/4270    Back ache 05/22/2015   Alimentary obesity 05/22/2015   Type 2 diabetes mellitus without complication, without long-term current use of insulin ([place]) 04/12/2014   Hypercholesterolemia 04/12/2014   OP (osteoporosis) 04/12/2014    Allergies  Allergen Reactions   Codeine Other (See Comments)    Abdominal pain    Past Surgical History:  Procedure Laterality Date   ABDOMINAL HYSTERECTOMY     BREAST BIOPSY Right 06/22/11   bx/clip-neg   BREAST CYST ASPIRATION Left    neg   COLONOSCOPY  2014   cleared for 5 yrs- [place] doc   COLONOSCOPY WITH PROPOFOL N/A 03/25/2018   Procedure: COLONOSCOPY WITH PROPOFOL;  Surgeon: Manya Silvas, MD;  Location: [facility] ENDOSCOPY;  Service: Endoscopy;  Laterality: N/A;   PARTIAL HYSTERECTOMY     SPLENECTOMY      Social History   Tobacco Use   Smoking status: Never   Smokeless tobacco: Never   Tobacco comments:    smoking cessation materials not required  Vaping Use   Vaping Use: Never used  Substance Use Topics   Alcohol use: Not Currently   Drug use: Never     Medication list has been reviewed and updated.  No outpatient medications have been marked as taking for the 11/10/22 encounter (Office Visit) with Juline Patch, MD.   Current Facility-Administered Medications for the 11/10/22 encounter (Office  Visit) with Juline Patch, MD  Medication   ipratropium-albuterol (DUONEB) 0.5-2.5 (3) MG/3ML nebulizer solution 3 mL       07/16/2022   10:52 AM 03/16/2022   10:36 AM 01/06/2022    9:51 AM 11/25/2021   10:35 AM  GAD 7 : Generalized Anxiety Score  Nervous, Anxious, on Edge 0 0 0 0  Control/stop worrying 0 0 0 0  Worry too much - different things 0 0 0 0  Trouble relaxing 0 0 0 0  Restless 0 0 0 0  Easily annoyed or irritable 0 0 0 0  Afraid - awful might happen 0 0 0 0  Total GAD 7 Score 0 0 0 0  Anxiety Difficulty Not difficult at all Not difficult at all  Not difficult at all       07/16/2022   10:52 AM 06/22/2022    9:12 AM  03/16/2022   10:35 AM  Depression screen PHQ 2/9  Decreased Interest 0 0 0  Down, Depressed, Hopeless 0 0 0  PHQ - 2 Score 0 0 0  Altered sleeping 0 0 0  Tired, decreased energy 1 1 0  Change in appetite 0 0 0  Feeling bad or failure about yourself  0 0 0  Trouble concentrating 0 1 0  Moving slowly or fidgety/restless 0 0 0  Suicidal thoughts 0 0 0  PHQ-9 Score 1 2 0  Difficult doing work/chores Not difficult at all Not difficult at all Not difficult at all    BP Readings from Last 3 Encounters:  11/10/22 132/65  11/10/22 120/76  11/04/22 116/70    Physical Exam Vitals and nursing note reviewed.  HENT:     Right Ear: Tympanic membrane normal.     Left Ear: Tympanic membrane normal.     Nose: Nose normal.  Cardiovascular:     Rate and Rhythm: Normal rate and regular rhythm.     Pulses:          Dorsalis pedis pulses are 1+ on the right side.       Posterior tibial pulses are 1+ on the right side.     Heart sounds: Normal heart sounds. No murmur heard.    No friction rub. No gallop.  Pulmonary:     Breath sounds: No wheezing, rhonchi or rales.  Musculoskeletal:     Lumbar back: Spasms present.     Right lower leg: No swelling, deformity, lacerations, tenderness or bony tenderness. No edema.     Wt Readings from Last 3 Encounters:  11/10/22 182 lb (82.6 kg)  11/04/22 182 lb (82.6 kg)  10/27/22 187 lb (84.8 kg)    BP 120/76   Pulse 81   Ht _0  (1.6 m)   Wt 182 lb (82.6 kg)   SpO2 93%   BMI 32.24 kg/m   Assessment and Plan: Patient was scheduled for a diabetic check however when she arrived she had severe leg pain and almost collapsed to the floor due to weakness of the right leg.  Onset over the past week pain in the right lower extremity which has been initially treated with increasing doses of gabapentin 1. Pain of right lower extremity New onset.  Persistent.  Gradually worsening.  To the point that she is unable to ambulate without "leg giving out".   Patient admits to having back pain prior which is gradually worsened as well.  I concerned that this is more of a radiculopathy and likely is due to a  herniated disc.  Given the patient has unable to ambulate on her own we will not allow the patient to drive and we have called family member to pick her up and take her to the hospital for further evaluation and if necessary x-rays.   Otilio Miu, MD

## 2022-11-10 NOTE — ED Notes (Signed)
Pt has chronic leg/back pain which worsened and brought her to ER. takes gabapentin at home for it but has not taken any today

## 2022-11-10 NOTE — Discharge Instructions (Signed)
Follow up with dr Rhea Bleacher office , please call for an appointment Use the pain medication and steroid pack as prescribed Beware sedation with the pain medication and muscle relaxer, do not operate a vehicle or heavy machinery while taking the pain medication or muscle relaxer Return to the ER if worsening

## 2022-11-10 NOTE — ED Provider Triage Note (Signed)
  Emergency Medicine Provider Triage Evaluation Note  Lindsey George , a 71 y.o.female,  was evaluated in triage.  Pt complains of right leg pain x 2 days.  She states that is a "throbbing sensation away from her hip down to her foot.  Describes burning sensation as well.  She states that her primary care provider thinks that it could be a herniated disc, but referred her to the emergency department just to be sure.  No other symptoms at this time.   Review of Systems  Positive: Right lower extremity pain Negative: Denies fever, chest pain, vomiting  Physical Exam   Vitals:   11/10/22 1211  BP: 132/65  Pulse: 85  Resp: 18  Temp: 98 F (36.7 C)  SpO2: 94%   Gen:   Awake, no distress   Resp:  Normal effort  MSK:   Moves extremities without difficulty  Other:    Medical Decision Making  Given the patient's initial medical screening exam, the following diagnostic evaluation has been ordered. The patient will be placed in the appropriate treatment space, once one is available, to complete the evaluation and treatment. I have discussed the plan of care with the patient and I have advised the patient that an ED physician or mid-level practitioner will reevaluate their condition after the test results have been received, as the results may give them additional insight into the type of treatment they may need.    Diagnostics: CT lumbar  Treatments: none immediately   Teodoro Spray, [place] 11/10/22 1235

## 2022-11-13 ENCOUNTER — Telehealth: Payer: Self-pay | Admitting: *Deleted

## 2022-11-13 NOTE — Patient Outreach (Signed)
  Care Coordination [facility] Note Transition Care Management Unsuccessful Follow-up Telephone Call  Date of discharge and from where:  [phone] [facility]  Attempts:  1st Attempt  Reason for unsuccessful TCM follow-up call:  Left voice message  [place] [facility] [phone]

## 2022-11-16 ENCOUNTER — Telehealth: Payer: Self-pay

## 2022-11-16 ENCOUNTER — Ambulatory Visit: Payer: Medicare PPO | Admitting: Family Medicine

## 2022-11-16 NOTE — Patient Outreach (Signed)
  Care Coordination TOC Note Transition Care Management Follow-up Telephone Call Date of discharge and from where: 11/10/22-[facility] ED DX: "lumbar radiculopathy" Red on EMMI-ED Discharge Alert Reason: "Scheduled follow up appt? No" Red Alert Date: 11/12/22 How have you been since you were released from the hospital? Patient reports that she is taking it easy. She feels like she is making slow progress. Pain is managed and controlled at present. She is taking pain meds about 3x/day. She was able to go to church yesterday which did her some good. She is going to see specialist on Thurs.  Any questions or concerns? No  Items Reviewed: Did the pt receive and understand the discharge instructions provided? Yes  Medications obtained and verified? Yes  Other? Yes  Any new allergies since your discharge? No  Dietary orders reviewed? Yes Do you have support at home? Yes   Home Care and Equipment/Supplies: Were home health services ordered? not applicable If so, what is the name of the agency? N/A  Has the agency set up a time to come to the patient's home? not applicable Were any new equipment or medical supplies ordered?  No What is the name of the medical supply agency? N/A Were you able to get the supplies/equipment? not applicable Do you have any questions related to the use of the equipment or supplies? No  Functional Questionnaire: (I = Independent and D = Dependent) ADLs: I  Bathing/Dressing- I  Meal Prep- I  Eating- I  Maintaining continence- I  Transferring/Ambulation- I  Managing Meds- I  Follow up appointments reviewed:  PCP Hospital f/u appt confirmed? Yes  Scheduled to see Dr. Ronnald Ramp on 11/24/22 @ 10:20am. [facility] f/u appt confirmed? Yes  Scheduled to see Dr. Cari Caraway on 11/19/22 @ 11am. Are transportation arrangements needed? No  If their condition worsens, is the pt aware to call PCP or go to the Emergency Dept.? Yes Was the patient provided with contact  information for the PCP's office or ED? Yes Was to pt encouraged to call back with questions or concerns? Yes  SDOH assessments and interventions completed:   Yes SDOH Interventions Today    Flowsheet Row Most Recent Value  SDOH Interventions   Food Insecurity Interventions Intervention Not Indicated  Transportation Interventions Intervention Not Indicated       Care Coordination Interventions:  Education provided    Encounter Outcome:  Pt. Visit Completed     Enzo Montgomery, RN,BSN,CCM [place] Management Telephonic Care Management Coordinator Direct Phone: [phone] Toll Free: [phone] Fax: [phone]

## 2022-11-18 NOTE — Progress Notes (Unsigned)
Referring Physician:  Naaman Plummer, MD Lindsey George,  [place] [id number]  Primary Physician:  Lindsey Patch, MD  History of Present Illness: 11/19/2022 [name] is here today with a chief complaint of  right sided low back pain that radiates into the right leg.  She has previously had a bout with this in the past.  She underwent injections in 2021 with some improvement in her symptoms.  Unfortunately, she did not like the idea of having a needle placed in her spine.  She presents this time with 2 to 3 weeks of worsening pain down her right leg particular when she stands, walks, or is otherwise active.  She has been on steroids which have helped somewhat. Bowel/Bladder Dysfunction: none  Conservative measures:  Physical therapy:  has not participated in? Multimodal medical therapy including regular antiinflammatories:  gabapentin, medrol dosepak, oxycodone Injections:  has not received any epidural steroid injections recently  Past Surgery: denies  Lindsey George has no symptoms of cervical myelopathy.  The symptoms are causing a significant impact on the patient's life.   I have utilized the care everywhere function in epic to review the outside records available from external health systems.  Review of Systems:  A 10 point review of systems is negative, except for the pertinent positives and negatives detailed in the HPI.  Past Medical History: Past Medical History:  Diagnosis Date   Asthma    Cancer (Lindsey George)    skin ca   Chronic back pain    Diabetes mellitus without complication (Lindsey George)    Hyperlipidemia    Hypertension    Obesity    Osteoporosis     Past Surgical History: Past Surgical History:  Procedure Laterality Date   ABDOMINAL HYSTERECTOMY     BREAST BIOPSY Right 06/22/11   bx/clip-neg   BREAST CYST ASPIRATION Left    neg   COLONOSCOPY  2014   cleared for 5 yrs- Lindsey George doc   COLONOSCOPY WITH PROPOFOL N/A 03/25/2018    Procedure: COLONOSCOPY WITH PROPOFOL;  Surgeon: Lindsey Silvas, MD;  Location: [facility] ENDOSCOPY;  Service: Endoscopy;  Laterality: N/A;   PARTIAL HYSTERECTOMY     SPLENECTOMY      Allergies: Allergies as of 11/19/2022 - Review Complete 11/19/2022  Allergen Reaction Noted   Codeine Other (See Comments) 08/30/2015    Medications: Current Meds  Medication Sig   albuterol (VENTOLIN HFA) 108 (90 Base) MCG/ACT inhaler Inhale 2 puffs into the lungs every 6 (six) hours as needed for wheezing or shortness of breath.   alendronate (FOSAMAX) 70 MG tablet Take 1 tablet (70 mg total) by mouth once a week. Take with a full glass of water on an empty stomach.   aspirin 81 MG tablet Take 81 mg by mouth daily.   calcium-vitamin D (OSCAL WITH D) 500-200 MG-UNIT tablet Take 1 tablet by mouth daily with breakfast.   carbidopa-levodopa (SINEMET IR) 25-100 MG tablet Take 1 tablet by mouth in the morning and at bedtime.   Cholecalciferol (VITAMIN D3 PO) Take 1 capsule by mouth daily.   ciprofloxacin (CIPRO) 100 MG tablet Take 1 tablet (100 mg total) by mouth 2 (two) times daily.   DULoxetine (CYMBALTA) 30 MG capsule Take 2 capsules daily= '60mg'$  a day   EPIPEN 2-PAK 0.3 MG/0.3ML SOAJ injection Inject 0.3 mg into the muscle as needed.   gabapentin (NEURONTIN) 300 MG capsule Take 1 capsule by mouth twice daily   glipiZIDE (GLIPIZIDE XL) 5  MG 24 hr tablet Take 1 tablet (5 mg total) by mouth daily.   losartan (COZAAR) 25 MG tablet Take 1 tablet (25 mg total) by mouth daily.   metFORMIN (GLUCOPHAGE) 500 MG tablet TAKE ONE TABLET BY MOUTH TWICE DAILY   Multiple Vitamin (MULTIVITAMIN) capsule Take 1 capsule by mouth daily.   ONETOUCH ULTRA test strip USE 1 STRIP TO CHECK GLUCOSE ONCE DAILY   oxyCODONE-acetaminophen (PERCOCET) 5-325 MG tablet Take 1 tablet by mouth every 4 (four) hours as needed for severe pain.   pioglitazone (ACTOS) 15 MG tablet Take 1 tablet (15 mg total) by mouth daily.    promethazine-dextromethorphan (PROMETHAZINE-DM) 6.25-15 MG/5ML syrup Take 5 mLs by mouth 4 (four) times daily as needed for cough.   simvastatin (ZOCOR) 40 MG tablet Take 1 tablet (40 mg total) by mouth daily.   Current Facility-Administered Medications for the 11/19/22 encounter (Office Visit) with Lindsey Maw, MD  Medication   ipratropium-albuterol (DUONEB) 0.5-2.5 (3) MG/3ML nebulizer solution 3 mL    Social History: Social History   Tobacco Use   Smoking status: Never   Smokeless tobacco: Never   Tobacco comments:    smoking cessation materials not required  Vaping Use   Vaping Use: Never used  Substance Use Topics   Alcohol use: Not Currently   Drug use: Never    Family Medical History: Family History  Problem Relation Age of Onset   Breast cancer Sister 34   Cancer Sister    Heart disease Sister    Cancer Father    Stroke Father    Heart disease Sister     Physical Examination: Vitals:   11/19/22 1059  BP: (!) 142/80    General: Patient is well developed, well nourished, calm, collected, and in no apparent distress. Attention to examination is appropriate.  Neck:   Supple.  Full range of motion.  Respiratory: Patient is breathing without any difficulty.   NEUROLOGICAL:     Awake, alert, oriented to person, place, and time.  Speech is clear and fluent. Fund of knowledge is appropriate.   Cranial Nerves: Pupils equal round and reactive to light.  Facial tone is symmetric.  Facial sensation is symmetric. Shoulder shrug is symmetric. Tongue protrusion is midline.  There is no pronator drift.  ROM of spine: full.    Strength: Side Biceps Triceps Deltoid Interossei Grip Wrist Ext. Wrist Flex.  R '5 5 5 5 5 5 5  '$ L '5 5 5 5 5 5 5   '$ Side Iliopsoas Quads Hamstring PF DF EHL  R '5 5 5 5 5 5  '$ L '5 5 5 5 5 5   '$ Reflexes are 1+ and symmetric at the biceps, triceps, brachioradialis, patella and achilles.   Hoffman's is present.   Bilateral upper and lower  extremity sensation is intact to light touch.    No evidence of dysmetria noted.     Medical Decision Making  Imaging: CT L spine 11/10/2022 IMPRESSION: 1. Interval increase in spinal canal and right lateral recess narrowing at L4-L5 secondary to interval increase in size of a circumferential disc bulge as well as increased ligamentum flavum hypertrophy. This could account for patient's right-sided symptoms. If more definitive characterization of the degree of degenerative change is clinically warranted, further evaluation with a non contrast enhanced lumbar spine MRI is recommended. 2. No evidence of acute fracture.   Aortic Atherosclerosis (ICD10-I70.0).     Electronically Signed   By: Marin Roberts M.D.   On: 11/10/2022 12:59  I have personally reviewed the images and agree with the above interpretation.  Assessment and Plan: Ms. Singleton is a pleasant 71 y.o. female with symptoms of lumbar radiculopathy and neurogenic claudication.  She has evidence of severe stenosis at L3-4 on a prior MRI scan and has new lateral recess stenosis at L4-5 on her CT scan.  She has an anterolisthesis at L3-4.  We reviewed the options and I recommended that she start with physical therapy and ibuprofen up to 600 mg 3 times a day as needed.  She is currently taking gabapentin 300 mg twice daily.  She would like to discontinue this.  I told her she could stop this if she would like.  However, she may notice that her nerve pain increases after she stops gabapentin.   We reviewed that narcotics are generally not indicated for nerve related pain.  I will see her back in 6 to 8 weeks.  I have asked her to contact me ahead of that so that I can order an MRI scan if she is not improving.  We did review that injections were an option, but she declined at this point.  I spent a total of 30 minutes in this patient's care today. This time was spent reviewing pertinent records including imaging studies,  obtaining and confirming history, performing a directed evaluation, formulating and discussing my recommendations, and documenting the visit within the medical record.    Thank you for involving me in the care of this patient.      Irvan Tiedt K. Izora Ribas MD, [facility] Neurosurgery

## 2022-11-19 ENCOUNTER — Ambulatory Visit (INDEPENDENT_AMBULATORY_CARE_PROVIDER_SITE_OTHER): Payer: Medicare PPO | Admitting: Neurosurgery

## 2022-11-19 ENCOUNTER — Encounter: Payer: Self-pay | Admitting: Neurosurgery

## 2022-11-19 VITALS — BP 142/80 | Ht 63.0 in | Wt 175.6 lb

## 2022-11-19 DIAGNOSIS — M48062 Spinal stenosis, lumbar region with neurogenic claudication: Secondary | ICD-10-CM | POA: Diagnosis not present

## 2022-11-19 DIAGNOSIS — M4310 Spondylolisthesis, site unspecified: Secondary | ICD-10-CM | POA: Diagnosis not present

## 2022-11-19 DIAGNOSIS — M5416 Radiculopathy, lumbar region: Secondary | ICD-10-CM | POA: Diagnosis not present

## 2022-11-19 MED ORDER — IBUPROFEN 200 MG PO TABS: 600.0000 mg | ORAL_TABLET | Freq: Three times a day (TID) | ORAL | 2 refills | Status: DC | PRN

## 2022-11-19 NOTE — Addendum Note (Signed)
Addended by: Meade Maw on: 11/19/2022 11:41 AM   Modules accepted: Orders

## 2022-11-24 ENCOUNTER — Ambulatory Visit: Payer: Medicare PPO | Admitting: Family Medicine

## 2022-11-24 ENCOUNTER — Encounter: Payer: Self-pay | Admitting: Family Medicine

## 2022-11-24 ENCOUNTER — Telehealth: Payer: Self-pay

## 2022-11-24 VITALS — BP 120/68 | HR 88 | Ht 63.0 in | Wt 175.0 lb

## 2022-11-24 DIAGNOSIS — M48062 Spinal stenosis, lumbar region with neurogenic claudication: Secondary | ICD-10-CM

## 2022-11-24 DIAGNOSIS — M4310 Spondylolisthesis, site unspecified: Secondary | ICD-10-CM

## 2022-11-24 DIAGNOSIS — M5416 Radiculopathy, lumbar region: Secondary | ICD-10-CM

## 2022-11-24 DIAGNOSIS — E119 Type 2 diabetes mellitus without complications: Secondary | ICD-10-CM | POA: Diagnosis not present

## 2022-11-24 DIAGNOSIS — M5412 Radiculopathy, cervical region: Secondary | ICD-10-CM | POA: Diagnosis not present

## 2022-11-24 DIAGNOSIS — M810 Age-related osteoporosis without current pathological fracture: Secondary | ICD-10-CM | POA: Diagnosis not present

## 2022-11-24 MED ORDER — DULOXETINE HCL 30 MG PO CPEP: ORAL_CAPSULE | ORAL | 2 refills | Status: DC

## 2022-11-24 MED ORDER — PIOGLITAZONE HCL 15 MG PO TABS: 15.0000 mg | ORAL_TABLET | Freq: Every day | ORAL | 1 refills | Status: DC

## 2022-11-24 MED ORDER — ALENDRONATE SODIUM 70 MG PO TABS: 70.0000 mg | ORAL_TABLET | ORAL | 0 refills | Status: DC

## 2022-11-24 MED ORDER — METFORMIN HCL 500 MG PO TABS: ORAL_TABLET | ORAL | 1 refills | Status: DC

## 2022-11-24 MED ORDER — GLIPIZIDE ER 5 MG PO TB24: 5.0000 mg | ORAL_TABLET | Freq: Every day | ORAL | 1 refills | Status: DC

## 2022-11-24 NOTE — Telephone Encounter (Signed)
Per Dr Rhea Bleacher note on 11/19/22: "I will see her back in 6 to 8 weeks. I have asked her to contact me ahead of that so that I can order an MRI scan if she is not improving. "   Order for MRI lumbar spine without contrast placed.

## 2022-11-24 NOTE — Telephone Encounter (Signed)
-----   Message from Lindsey George sent at 11/24/2022 11:39 AM EST ----- Regarding: order MRI Contact: [phone] She saw Dr.Yarbrough on 12/21 and he offered to order a MRI. She would like for him to place the order. She is having increased pain that is keeping her up at night. She will call [facility] in [place] to schedule her PT appt.

## 2022-11-24 NOTE — Progress Notes (Signed)
Date:  11/24/2022   Name:  Lindsey George   DOB:  [birth year]   MRN:  [id number]   Chief Complaint: Osteoporosis, Depression, and Diabetes (132 this am)  Depression        This is a chronic problem.  The current episode started more than 1 year ago.   The problem occurs intermittently.  The problem has been gradually improving since onset.  Associated symptoms include fatigue.  Associated symptoms include no decreased concentration, no helplessness, no hopelessness, does not have insomnia, not irritable, no restlessness, no decreased interest, no appetite change, no body aches, no myalgias, no headaches, no indigestion, not sad and no suicidal ideas.  Past treatments include SNRIs - Serotonin and norepinephrine reuptake inhibitors.  Previous treatment provided moderate relief. Diabetes She presents for her follow-up diabetic visit. She has type 2 diabetes mellitus. Her disease course has been stable. There are no hypoglycemic associated symptoms. Pertinent negatives for hypoglycemia include no dizziness, headaches or nervousness/anxiousness. Associated symptoms include fatigue. Pertinent negatives for diabetes include no chest pain, no foot paresthesias, no polydipsia, no polyuria, no visual change and no weakness. There are no hypoglycemic complications. Symptoms are stable. There are no diabetic complications. Her weight is stable. She is following a generally healthy diet. Meal planning includes avoidance of concentrated sweets and carbohydrate counting. She participates in exercise intermittently. Her home blood glucose trend is fluctuating minimally. Her breakfast blood glucose is taken between 8-9 am. Her breakfast blood glucose range is generally 140-180 mg/dl. An ACE inhibitor/angiotensin II receptor blocker is being taken.    Lab Results  Component Value Date   NA 138 07/16/2022   K 4.8 07/16/2022   CO2 25 07/16/2022   GLUCOSE 120 (H) 07/16/2022   BUN 16 07/16/2022   CREATININE  0.56 (L) 07/16/2022   CALCIUM 10.0 07/16/2022   EGFR 97 07/16/2022   GFRNONAA 92 11/11/2020   Lab Results  Component Value Date   CHOL 130 03/16/2022   HDL 51 03/16/2022   LDLCALC 61 03/16/2022   TRIG 93 03/16/2022   CHOLHDL 2.9 09/19/2018   No results found for: "TSH" Lab Results  Component Value Date   HGBA1C 6.6 (H) 07/16/2022   Lab Results  Component Value Date   WBC 12.1 (H) 08/11/2018   HGB 12.9 08/11/2018   HCT 40.8 08/11/2018   MCV 80.5 08/11/2018   PLT 394 08/11/2018   Lab Results  Component Value Date   ALT 18 07/21/2021   AST 14 07/21/2021   ALKPHOS 51 07/21/2021   BILITOT 0.3 07/21/2021   No results found for: "25OHVITD2", "25OHVITD3", "VD25OH"   Review of Systems  Constitutional:  Positive for fatigue. Negative for appetite change, chills and fever.  HENT:  Negative for drooling, ear discharge, ear pain and sore throat.   Respiratory:  Negative for cough, shortness of breath and wheezing.   Cardiovascular:  Negative for chest pain, palpitations and leg swelling.  Gastrointestinal:  Negative for abdominal pain, blood in stool, constipation, diarrhea and nausea.  Endocrine: Negative for polydipsia and polyuria.  Genitourinary:  Negative for dysuria, frequency, hematuria and urgency.  Musculoskeletal:  Positive for back pain. Negative for myalgias and neck pain.  Skin:  Negative for rash.  Allergic/Immunologic: Negative for environmental allergies.  Neurological:  Negative for dizziness, weakness and headaches.  Hematological:  Does not bruise/bleed easily.  Psychiatric/Behavioral:  Positive for depression. Negative for decreased concentration and suicidal ideas. The patient is not nervous/anxious and does not have insomnia.  Patient Active Problem List   Diagnosis Date Noted   Acute cough 11/04/2022   Reactive airway disease, mild intermittent, uncomplicated 14/43/1540   Change in bowel habits 12/10/2021   Pes anserinus bursitis of left knee  10/08/2021   DDD (degenerative disc disease), cervical 10/08/2021   Impingement syndrome of left shoulder 08/18/2021   Hand pain, left 06/26/2021   Loss of memory 06/26/2021   Tremor 06/26/2021   Lumbar stenosis with neurogenic claudication 08/27/2020   Chronic bilateral low back pain with bilateral sciatica 08/27/2020   Essential hypertension 03/06/2020   Cervical radiculopathy 03/06/2020   Asymptomatic varicose veins of both lower extremities 03/06/2020   Morbid obesity ([place]) 03/29/2019   Allergic rhinitis 01/19/2018   Hx of adenomatous colonic polyps 01/14/2018   Reactive airway disease without complication 08/67/6195   Back ache 05/22/2015   Alimentary obesity 05/22/2015   Type 2 diabetes mellitus without complication, without long-term current use of insulin ([place]) 04/12/2014   Hypercholesterolemia 04/12/2014   OP (osteoporosis) 04/12/2014    Allergies  Allergen Reactions   Codeine Other (See Comments)    Abdominal pain    Past Surgical History:  Procedure Laterality Date   ABDOMINAL HYSTERECTOMY     BREAST BIOPSY Right 06/22/11   bx/clip-neg   BREAST CYST ASPIRATION Left    neg   COLONOSCOPY  2014   cleared for 5 yrs- [place] doc   COLONOSCOPY WITH PROPOFOL N/A 03/25/2018   Procedure: COLONOSCOPY WITH PROPOFOL;  Surgeon: Manya Silvas, MD;  Location[facility] END[facility]COPY;  Service: Endoscopy;  Laterality: N/A;   PARTIAL HYSTERECTOMY     SPLENECTOMY      Social History   Tobacco Use   Smoking status: Never   Smokeless tobacco: Never   Tobacco comments:    smoking cessation materials not required  Vaping Use   Vaping Use: Never used  Substance Use Topics   Alcohol use: Not Currently   Drug use: Never     Medication list has been reviewed and updated.  Current Meds  Medication Sig   albuterol (VENTOLIN HFA) 108 (90 Base) MCG/ACT inhaler Inhale 2 puffs into the lungs every 6 (six) hours as needed for wheezing or shortness of breath.   alendronate (FOSAMAX) 70 MG  tablet Take 1 tablet (70 mg total) by mouth once a week. Take with a full glass of water on an empty stomach.   aspirin 81 MG tablet Take 81 mg by mouth daily.   calcium-vitamin D (OSCAL WITH D) 500-200 MG-UNIT tablet Take 1 tablet by mouth daily with breakfast.   carbidopa-levodopa (SINEMET IR) 25-100 MG tablet Take 1 tablet by mouth in the morning and at bedtime.   Cholecalciferol (VITAMIN D3 PO) Take 1 capsule by mouth daily.   DULoxetine (CYMBALTA) 30 MG capsule Take 2 capsules daily= 66m a day   EPIPEN 2-PAK 0.3 MG/0.3ML SOAJ injection Inject 0.3 mg into the muscle as needed.   gabapentin (NEURONTIN) 300 MG capsule Take 1 capsule by mouth twice daily   glipiZIDE (GLIPIZIDE XL) 5 MG 24 hr tablet Take 1 tablet (5 mg total) by mouth daily.   ibuprofen (MOTRIN IB) 200 MG tablet Take 3 tablets (600 mg total) by mouth every 8 (eight) hours as needed.   losartan (COZAAR) 25 MG tablet Take 1 tablet (25 mg total) by mouth daily.   metFORMIN (GLUCOPHAGE) 500 MG tablet TAKE ONE TABLET BY MOUTH TWICE DAILY   Multiple Vitamin (MULTIVITAMIN) capsule Take 1 capsule by mouth daily.   ONETOUCH  ULTRA test strip USE 1 STRIP TO CHECK GLUCOSE ONCE DAILY   pioglitazone (ACTOS) 15 MG tablet Take 1 tablet (15 mg total) by mouth daily.   simvastatin (ZOCOR) 40 MG tablet Take 1 tablet (40 mg total) by mouth daily.   [DISCONTINUED] ciprofloxacin (CIPRO) 100 MG tablet Take 1 tablet (100 mg total) by mouth 2 (two) times daily.   Current Facility-Administered Medications for the 11/24/22 encounter (Office Visit) with Juline Patch, MD  Medication   ipratropium-albuterol (DUONEB) 0.5-2.5 (3) MG/3ML nebulizer solution 3 mL       11/24/2022   10:25 AM 07/16/2022   10:52 AM 03/16/2022   10:36 AM 01/06/2022    9:51 AM  GAD 7 : Generalized Anxiety Score  Nervous, Anxious, on Edge 0 0 0 0  Control/stop worrying 0 0 0 0  Worry too much - different things 0 0 0 0  Trouble relaxing 0 0 0 0  Restless 0 0 0 0  Easily  annoyed or irritable 0 0 0 0  Afraid - awful might happen 0 0 0 0  Total GAD 7 Score 0 0 0 0  Anxiety Difficulty Not difficult at all Not difficult at all Not difficult at all        11/24/2022   10:25 AM 07/16/2022   10:52 AM 06/22/2022    9:12 AM  Depression screen PHQ 2/9  Decreased Interest 0 0 0  Down, Depressed, Hopeless 0 0 0  PHQ - 2 Score 0 0 0  Altered sleeping 0 0 0  Tired, decreased energy 0 1 1  Change in appetite 0 0 0  Feeling bad or failure about yourself  0 0 0  Trouble concentrating 0 0 1  Moving slowly or fidgety/restless 0 0 0  Suicidal thoughts 0 0 0  PHQ-9 Score 0 1 2  Difficult doing work/chores Not difficult at all Not difficult at all Not difficult at all    BP Readings from Last 3 Encounters:  11/24/22 120/68  11/19/22 (!) 142/80  11/10/22 132/65    Physical Exam Vitals and nursing note reviewed. Exam conducted with a chaperone present.  Constitutional:      General: She is not irritable.She is not in acute distress.    Appearance: She is not diaphoretic.  HENT:     Head: Normocephalic and atraumatic.     Right Ear: External ear normal.     Left Ear: External ear normal.     Nose: Nose normal.     Mouth/Throat:     Mouth: Mucous membranes are moist.  Eyes:     General:        Right eye: No discharge.        Left eye: No discharge.     Conjunctiva/sclera: Conjunctivae normal.     Pupils: Pupils are equal, round, and reactive to light.  Neck:     Thyroid: No thyromegaly.     Vascular: No JVD.  Cardiovascular:     Rate and Rhythm: Normal rate and regular rhythm.     Heart sounds: Normal heart sounds. No murmur heard.    No friction rub. No gallop.  Pulmonary:     Effort: Pulmonary effort is normal.     Breath sounds: Normal breath sounds. No wheezing, rhonchi or rales.  Abdominal:     General: Bowel sounds are normal.     Palpations: Abdomen is soft. There is no mass.     Tenderness: There is no abdominal tenderness. There is  no  guarding.  Musculoskeletal:        General: Normal range of motion.     Cervical back: Normal range of motion and neck supple.  Lymphadenopathy:     Cervical: No cervical adenopathy.  Skin:    General: Skin is warm and dry.  Neurological:     Mental Status: She is alert.     Deep Tendon Reflexes: Reflexes are normal and symmetric.     Wt Readings from Last 3 Encounters:  11/24/22 175 lb (79.4 kg)  11/19/22 175 lb 9.6 oz (79.7 kg)  11/10/22 181 lb 14.1 oz (82.5 kg)    BP 120/68   Pulse 88   Ht _0  (1.6 m)   Wt 175 lb (79.4 kg)   SpO2 96%   BMI 31.00 kg/m   Assessment and Plan:  1. Type 2 diabetes mellitus without complication, without long-term current use of insulin (HCC) Chronic.  Controlled.  Stable.  On triple therapy including glipizide XL 5 mg once a day, metformin 500 mg twice a day, and pioglitazone 15 mg once a day.  Fasting blood sugars in the 1 40-1 50 range.  Asymptomatic.  Tolerating medications well.  Will check comprehensive metabolic panel for electrolytes and GFR.  We will also check A1c for current status of control. - glipiZIDE (GLIPIZIDE XL) 5 MG 24 hr tablet; Take 1 tablet (5 mg total) by mouth daily.  Dispense: 90 tablet; Refill: 1 - metFORMIN (GLUCOPHAGE) 500 MG tablet; TAKE ONE TABLET BY MOUTH TWICE DAILY  Dispense: 180 tablet; Refill: 1 - pioglitazone (ACTOS) 15 MG tablet; Take 1 tablet (15 mg total) by mouth daily.  Dispense: 90 tablet; Refill: 1 - HgB A1c - Comprehensive Metabolic Panel (CMET)  2. Age-related osteoporosis without current pathological fracture .  Controlled.  Stable.  Currently on Fosamax 70 mg once a week and doing well.  No symptomatology and tolerating medication well. - alendronate (FOSAMAX) 70 MG tablet; Take 1 tablet (70 mg total) by mouth once a week. Take with a full glass of water on an empty stomach.  Dispense: 12 tablet; Refill: 0  3. Cervical radiculopathy Chronic.  Controlled.  Stable.  Significant pain particularly  in the lumbar area and was treated recently had an MRI that indicated a bulging disc.  We will continue duloxetine 30 mg 2 tablets daily. - DULoxetine (CYMBALTA) 30 MG capsule; Take 2 capsules daily= 54m a day  Dispense: 60 capsule; Refill: 2    DOtilio Miu MD

## 2022-11-25 ENCOUNTER — Telehealth: Payer: Self-pay | Admitting: Family Medicine

## 2022-11-25 LAB — COMPREHENSIVE METABOLIC PANEL
ALT: 15 IU/L (ref 0–32)
AST: 12 IU/L (ref 0–40)
Albumin/Globulin Ratio: 1.9 (ref 1.2–2.2)
Albumin: 4.7 g/dL (ref 3.8–4.8)
Alkaline Phosphatase: 47 IU/L (ref 44–121)
BUN/Creatinine Ratio: 23 (ref 12–28)
BUN: 14 mg/dL (ref 8–27)
Bilirubin Total: 0.4 mg/dL (ref 0.0–1.2)
CO2: 25 mmol/L (ref 20–29)
Calcium: 10.6 mg/dL — ABNORMAL HIGH (ref 8.7–10.3)
Chloride: 100 mmol/L (ref 96–106)
Creatinine, Ser: 0.62 mg/dL (ref 0.57–1.00)
Globulin, Total: 2.5 g/dL (ref 1.5–4.5)
Glucose: 118 mg/dL — ABNORMAL HIGH (ref 70–99)
Potassium: 4.3 mmol/L (ref 3.5–5.2)
Sodium: 140 mmol/L (ref 134–144)
Total Protein: 7.2 g/dL (ref 6.0–8.5)
eGFR: 95 mL/min/{1.73_m2} (ref >59)

## 2022-11-25 LAB — HEMOGLOBIN A1C
Est. average glucose Bld gHb Est-mCnc: 154 mg/dL
Hgb A1c MFr Bld: 7 % — ABNORMAL HIGH (ref 4.8–5.6)

## 2022-11-25 NOTE — Telephone Encounter (Signed)
Noted. I will obtain authorization.  

## 2022-11-25 NOTE — Telephone Encounter (Signed)
Copied from [place] [id number]. Topic: General - Other >> Nov 25, 2022  9:57 AM Cyndi Bender wrote: Reason for CRM: Pt requesting that Baxter Flattery return her call at [phone]

## 2022-11-27 NOTE — Telephone Encounter (Signed)
Scheduled for 12/04/2022.

## 2022-12-04 ENCOUNTER — Ambulatory Visit
Admission: RE | Admit: 2022-12-04 | Discharge: 2022-12-04 | Disposition: A | Discharge status: Home / Self Care | Payer: Medicare PPO | Source: Ambulatory Visit | Attending: Neurosurgery | Admitting: Neurosurgery

## 2022-12-04 DIAGNOSIS — M48062 Spinal stenosis, lumbar region with neurogenic claudication: Secondary | ICD-10-CM | POA: Insufficient documentation

## 2022-12-04 DIAGNOSIS — M47816 Spondylosis without myelopathy or radiculopathy, lumbar region: Secondary | ICD-10-CM | POA: Diagnosis not present

## 2022-12-04 DIAGNOSIS — H2513 Age-related nuclear cataract, bilateral: Secondary | ICD-10-CM | POA: Diagnosis not present

## 2022-12-04 DIAGNOSIS — M5416 Radiculopathy, lumbar region: Secondary | ICD-10-CM | POA: Insufficient documentation

## 2022-12-04 DIAGNOSIS — Z0100 Encounter for examination of eyes and vision without abnormal findings: Secondary | ICD-10-CM | POA: Diagnosis not present

## 2022-12-04 DIAGNOSIS — M4310 Spondylolisthesis, site unspecified: Secondary | ICD-10-CM | POA: Diagnosis not present

## 2022-12-04 DIAGNOSIS — M5126 Other intervertebral disc displacement, lumbar region: Secondary | ICD-10-CM | POA: Diagnosis not present

## 2022-12-04 DIAGNOSIS — E119 Type 2 diabetes mellitus without complications: Secondary | ICD-10-CM | POA: Diagnosis not present

## 2022-12-04 LAB — HM DIABETES EYE EXAM

## 2022-12-07 ENCOUNTER — Telehealth (INDEPENDENT_AMBULATORY_CARE_PROVIDER_SITE_OTHER): Payer: Medicare PPO

## 2022-12-07 DIAGNOSIS — M48062 Spinal stenosis, lumbar region with neurogenic claudication: Secondary | ICD-10-CM

## 2022-12-07 DIAGNOSIS — M4310 Spondylolisthesis, site unspecified: Secondary | ICD-10-CM

## 2022-12-07 NOTE — Telephone Encounter (Signed)
-----   Message from Peggyann Shoals sent at 12/07/2022 12:06 PM EST ----- Regarding: MRI results Contact: [phone] Patient calling if her MRI results have been reviewed. I told her it might be tomorrow or Wednesday before she gets a call back.

## 2022-12-09 NOTE — Telephone Encounter (Signed)
Reviewed the MRI findings with the patient.  We will send her for an injection.

## 2022-12-09 NOTE — Telephone Encounter (Signed)
She is calling back to see if her results have been reviewed. I told her to please give [place] another 24 hours, she understood. She is just anxious to get the results and maybe be referred for injections.

## 2022-12-17 ENCOUNTER — Encounter: Payer: Self-pay | Admitting: Family Medicine

## 2022-12-21 DIAGNOSIS — M48062 Spinal stenosis, lumbar region with neurogenic claudication: Secondary | ICD-10-CM | POA: Diagnosis not present

## 2022-12-21 DIAGNOSIS — M5441 Lumbago with sciatica, right side: Secondary | ICD-10-CM | POA: Diagnosis not present

## 2022-12-21 DIAGNOSIS — G8929 Other chronic pain: Secondary | ICD-10-CM | POA: Diagnosis not present

## 2022-12-25 DIAGNOSIS — M48062 Spinal stenosis, lumbar region with neurogenic claudication: Secondary | ICD-10-CM | POA: Diagnosis not present

## 2022-12-25 DIAGNOSIS — E119 Type 2 diabetes mellitus without complications: Secondary | ICD-10-CM | POA: Diagnosis not present

## 2023-01-11 DIAGNOSIS — M5441 Lumbago with sciatica, right side: Secondary | ICD-10-CM | POA: Diagnosis not present

## 2023-01-11 DIAGNOSIS — M5442 Lumbago with sciatica, left side: Secondary | ICD-10-CM | POA: Diagnosis not present

## 2023-01-11 DIAGNOSIS — G8929 Other chronic pain: Secondary | ICD-10-CM | POA: Diagnosis not present

## 2023-01-11 DIAGNOSIS — M48062 Spinal stenosis, lumbar region with neurogenic claudication: Secondary | ICD-10-CM | POA: Diagnosis not present

## 2023-01-14 ENCOUNTER — Encounter: Payer: Self-pay | Admitting: Neurosurgery

## 2023-01-14 ENCOUNTER — Ambulatory Visit: Payer: Medicare PPO | Admitting: Neurosurgery

## 2023-01-14 DIAGNOSIS — M5030 Other cervical disc degeneration, unspecified cervical region: Secondary | ICD-10-CM

## 2023-01-14 DIAGNOSIS — M48062 Spinal stenosis, lumbar region with neurogenic claudication: Secondary | ICD-10-CM | POA: Diagnosis not present

## 2023-01-14 DIAGNOSIS — M5416 Radiculopathy, lumbar region: Secondary | ICD-10-CM

## 2023-01-14 DIAGNOSIS — M5412 Radiculopathy, cervical region: Secondary | ICD-10-CM

## 2023-01-14 MED ORDER — GABAPENTIN 300 MG PO CAPS: 300.0000 mg | ORAL_CAPSULE | Freq: Three times a day (TID) | ORAL | 1 refills | Status: DC

## 2023-01-14 NOTE — Progress Notes (Signed)
Referring Physician:  Juline Patch, MD [address] [place],  [place] [address]  Primary Physician:  Juline Patch, MD  History of Present Illness: 01/14/2023 She is doing somewhat better after her injection last month.  She still having pain down her right leg as described below.   11/19/2022 Lindsey George is here today with a chief complaint of  right sided low back pain that radiates into the right leg.  She has previously had a bout with this in the past.  She underwent injections in 2021 with some improvement in her symptoms.  Unfortunately, she did not like the idea of having a needle placed in her spine.  She presents this time with 2 to 3 weeks of worsening pain down her right leg particular when she stands, walks, or is otherwise active.  She has been on steroids which have helped somewhat. Bowel/Bladder Dysfunction: none  Conservative measures:  Physical therapy:  has not participated in? Multimodal medical therapy including regular antiinflammatories:  gabapentin, medrol dosepak, oxycodone Injections:  has not received any epidural steroid injections recently  Past Surgery: denies  Lindsey George has no symptoms of cervical myelopathy.  The symptoms are causing a significant impact on the patient's life.   I have utilized the care everywhere function in epic to review the outside records available from external health systems.  Review of Systems:  A 10 point review of systems is negative, except for the pertinent positives and negatives detailed in the HPI.  Past Medical History: Past Medical History:  Diagnosis Date   Asthma    Cancer ([place])    skin ca   Chronic back pain    Diabetes mellitus without complication ([place])    Hyperlipidemia    Hypertension    Obesity    Osteoporosis     Past Surgical History: Past Surgical History:  Procedure Laterality Date   ABDOMINAL HYSTERECTOMY     BREAST BIOPSY Right 06/22/11    bx/clip-neg   BREAST CYST ASPIRATION Left    neg   COLONOSCOPY  2014   cleared for 5 yrs- [facility] doc   COLONOSCOPY WITH PROPOFOL N/A 03/25/2018   Procedure: COLONOSCOPY WITH PROPOFOL;  Surgeon: Manya Silvas, MD;  Location: [facility] ENDOSCOPY;  Service: Endoscopy;  Laterality: N/A;   PARTIAL HYSTERECTOMY     SPLENECTOMY      Allergies: Allergies as of 01/14/2023 - Review Complete 11/24/2022  Allergen Reaction Noted   Codeine Other (See Comments) 08/30/2015    Medications: No outpatient medications have been marked as taking for the 01/14/23 encounter (Office Visit) with Meade Maw, MD.   Current Facility-Administered Medications for the 01/14/23 encounter (Office Visit) with Meade Maw, MD  Medication   ipratropium-albuterol (DUONEB) 0.5-2.5 (3) MG/3ML nebulizer solution 3 mL    Social History: Social History   Tobacco Use   Smoking status: Never   Smokeless tobacco: Never   Tobacco comments:    smoking cessation materials not required  Vaping Use   Vaping Use: Never used  Substance Use Topics   Alcohol use: Not Currently   Drug use: Never    Family Medical History: Family History  Problem Relation Age of Onset   Breast cancer Sister 44   Cancer Sister    Heart disease Sister    Cancer Father    Stroke Father    Heart disease Sister     Physical Examination: There were no vitals filed for this visit.   General:  Patient is well developed, well nourished, calm, collected, and in no apparent distress. Attention to examination is appropriate.  Neck:   Supple.  Full range of motion.  Respiratory: Patient is breathing without any difficulty.   NEUROLOGICAL:     Awake, alert, oriented to person, place, and time.  Speech is clear and fluent. Fund of knowledge is appropriate.   Cranial Nerves: Pupils equal round and reactive to light.  Facial tone is symmetric.  Facial sensation is symmetric. Shoulder shrug is symmetric. Tongue protrusion is midline.   There is no pronator drift.  ROM of spine: full.    Strength: Side Biceps Triceps Deltoid Interossei Grip Wrist Ext. Wrist Flex.  R 5 5 5 5 5 5 5  $ L 5 5 5 5 5 5 5   $ Side Iliopsoas Quads Hamstring PF DF EHL  R 5 5 5 5 5 5  $ L 5 5 5 5 5 5   $ Reflexes are 1+ and symmetric at the biceps, triceps, brachioradialis, patella and achilles.   Hoffman's is present.   Bilateral upper and lower extremity sensation is intact to light touch.    No evidence of dysmetria noted.     Medical Decision Making  Imaging: CT L spine 11/10/2022 IMPRESSION: 1. Interval increase in spinal canal and right lateral recess narrowing at L4-L5 secondary to interval increase in size of a circumferential disc bulge as well as increased ligamentum flavum hypertrophy. This could account for patient's right-sided symptoms. If more definitive characterization of the degree of degenerative change is clinically warranted, further evaluation with a non contrast enhanced lumbar spine MRI is recommended. 2. No evidence of acute fracture.   Aortic Atherosclerosis (ICD10-I70.0).     Electronically Signed   By: Marin Roberts M.D.   On: 11/10/2022 12:59  MRI L spine 12/04/2022 IMPRESSION: 1. Diffuse lumbar spine spondylosis without significant interval change compared with the prior exam. 2. At L3-4 there is a broad-based disc bulge. Moderate bilateral facet arthropathy with bilateral facet effusions and ligamentum flavum infolding. Severe spinal stenosis. Severe right foraminal stenosis. Mild left foraminal stenosis. 3. At L4-5 there is a broad-based disc bulge with a broad shallow right paracentral disc protrusion contacting the right intraspinal L5 nerve root. Moderate bilateral facet arthropathy with a small left facet effusion. Bilateral lateral recess stenosis. Mild spinal stenosis. Moderate left foraminal stenosis. Mild-moderate right foraminal stenosis. 4. At L5-S1 there is a mild broad-based disc  osteophyte complex. Mild bilateral facet arthropathy. No right foraminal stenosis. Moderate left foraminal stenosis. 5. At L1-2 there is a broad-based disc bulge with a tiny left paracentral disc protrusion. Left lateral recess stenosis. Mild bilateral facet arthropathy. 6. At L2-3 there is a broad-based disc bulge. Mild bilateral facet arthropathy. Mild-moderate spinal stenosis. 7. No acute osseous injury of the lumbar spine.     Electronically Signed   By: Kathreen Devoid M.D.   On: 12/06/2022 08:03  I have personally reviewed the images and agree with the above interpretation.  Assessment and Plan: Lindsey George is a pleasant 72 y.o. female with symptoms of lumbar radiculopathy and neurogenic claudication.     She has evidence of severe central stenosis at L3-4 and right lateral recess stenosis at L4-5 on her MRI scan.  She has an anterolisthesis at L3-4.  Will continue her exercises for now.  She would like to try an increased dose of gabapentin.  I will send that in for her.  She will see how that works and determine whether to  undergo a second injection.  I will see her back in 6 weeks to review her condition.   We discussed that there may be a surgical option for her condition.  There would be a smaller decompression option with L3-4 and L4-5 decompression with right-sided L3-4 far lateral foraminotomy versus a more extensive minimal invasive fusion from L3-5.  I think she would likely elect for the smaller version if she ultimately fails conservative management.  We did review that she may need to see physical therapy formally if she does not achieve adequate pain control with gabapentin and ibuprofen.  I spent a total of 10 minutes in this patient's care today. This time was spent reviewing pertinent records including imaging studies, obtaining and confirming history, performing a directed evaluation, formulating and discussing my recommendations, and documenting the visit within  the medical record.    Thank you for involving me in the care of this patient.      Marypat Kimmet K. Izora Ribas MD, [facility] Neurosurgery

## 2023-01-19 DIAGNOSIS — G20A1 Parkinson's disease without dyskinesia, without mention of fluctuations: Secondary | ICD-10-CM | POA: Diagnosis not present

## 2023-01-19 DIAGNOSIS — K5904 Chronic idiopathic constipation: Secondary | ICD-10-CM | POA: Diagnosis not present

## 2023-01-19 DIAGNOSIS — Y92009 Unspecified place in unspecified non-institutional (private) residence as the place of occurrence of the external cause: Secondary | ICD-10-CM | POA: Diagnosis not present

## 2023-01-19 DIAGNOSIS — R2689 Other abnormalities of gait and mobility: Secondary | ICD-10-CM | POA: Diagnosis not present

## 2023-01-19 DIAGNOSIS — W19XXXD Unspecified fall, subsequent encounter: Secondary | ICD-10-CM | POA: Diagnosis not present

## 2023-01-19 DIAGNOSIS — M5136 Other intervertebral disc degeneration, lumbar region: Secondary | ICD-10-CM | POA: Diagnosis not present

## 2023-01-19 DIAGNOSIS — G629 Polyneuropathy, unspecified: Secondary | ICD-10-CM | POA: Diagnosis not present

## 2023-01-20 ENCOUNTER — Telehealth: Payer: Self-pay

## 2023-01-20 DIAGNOSIS — M4310 Spondylolisthesis, site unspecified: Secondary | ICD-10-CM

## 2023-01-20 DIAGNOSIS — M5416 Radiculopathy, lumbar region: Secondary | ICD-10-CM

## 2023-01-20 DIAGNOSIS — M48062 Spinal stenosis, lumbar region with neurogenic claudication: Secondary | ICD-10-CM

## 2023-01-20 NOTE — Telephone Encounter (Signed)
-----   Message from Peggyann Shoals sent at 01/20/2023 11:10 AM EST ----- Regarding: new PT referral Her PT referral has expired. Can  you place a new PT order to [facility] please.

## 2023-01-20 NOTE — Telephone Encounter (Signed)
New referral placed.

## 2023-01-20 NOTE — Telephone Encounter (Signed)
Appt 02/03/2023

## 2023-01-20 NOTE — Telephone Encounter (Signed)
Referral faxed to [facility].

## 2023-01-21 DIAGNOSIS — Z86010 Personal history of colonic polyps: Secondary | ICD-10-CM | POA: Diagnosis not present

## 2023-01-21 DIAGNOSIS — K5900 Constipation, unspecified: Secondary | ICD-10-CM | POA: Diagnosis not present

## 2023-01-29 ENCOUNTER — Other Ambulatory Visit: Payer: Self-pay | Admitting: Family Medicine

## 2023-01-29 DIAGNOSIS — I10 Essential (primary) hypertension: Secondary | ICD-10-CM

## 2023-02-01 NOTE — Telephone Encounter (Signed)
Requested Prescriptions  Pending Prescriptions Disp Refills   losartan (COZAAR) 25 MG tablet [Pharmacy Med Name: Losartan Potassium 25 MG Oral Tablet] 90 tablet 0    Sig: Take 1 tablet by mouth once daily     Cardiovascular:  Angiotensin Receptor Blockers Passed - 01/29/2023  8:50 PM      Passed - Cr in normal range and within 180 days    Creatinine  Date Value Ref Range Status  11/26/2013 0.66 0.60 - 1.30 mg/dL Final   Creatinine, Ser  Date Value Ref Range Status  11/24/2022 0.62 0.57 - 1.00 mg/dL Final         Passed - K in normal range and within 180 days    Potassium  Date Value Ref Range Status  11/24/2022 4.3 3.5 - 5.2 mmol/L Final  11/26/2013 3.7 3.5 - 5.1 mmol/L Final         Passed - Patient is not pregnant      Passed - Last BP in normal range    BP Readings from Last 1 Encounters:  01/14/23 130/78         Passed - Valid encounter within last 6 months    Recent Outpatient Visits           2 months ago Type 2 diabetes mellitus without complication, without long-term current use of insulin ([place])   [facility] [place], Deanna C, MD   2 months ago Pain of right lower extremity   [facility] [place], Deanna C, MD   2 months ago Wrist pain, acute, right   [facility] [place], Deanna C, MD   3 months ago Acute cough   [place] [facility], Earley Abide, MD   6 months ago Type 2 diabetes mellitus without complication, without long-term current use of insulin [facility])   [facility] [place], [place], MD       Future Appointments             In 1 month Juline Patch, MD [place] at [facility], [facility]

## 2023-02-03 DIAGNOSIS — M6281 Muscle weakness (generalized): Secondary | ICD-10-CM | POA: Diagnosis not present

## 2023-02-03 DIAGNOSIS — G8929 Other chronic pain: Secondary | ICD-10-CM | POA: Diagnosis not present

## 2023-02-03 DIAGNOSIS — M5442 Lumbago with sciatica, left side: Secondary | ICD-10-CM | POA: Diagnosis not present

## 2023-02-03 DIAGNOSIS — M5441 Lumbago with sciatica, right side: Secondary | ICD-10-CM | POA: Diagnosis not present

## 2023-02-03 DIAGNOSIS — M48062 Spinal stenosis, lumbar region with neurogenic claudication: Secondary | ICD-10-CM | POA: Diagnosis not present

## 2023-02-03 DIAGNOSIS — M5416 Radiculopathy, lumbar region: Secondary | ICD-10-CM | POA: Diagnosis not present

## 2023-02-03 DIAGNOSIS — M4310 Spondylolisthesis, site unspecified: Secondary | ICD-10-CM | POA: Diagnosis not present

## 2023-02-08 DIAGNOSIS — M5416 Radiculopathy, lumbar region: Secondary | ICD-10-CM | POA: Diagnosis not present

## 2023-02-08 DIAGNOSIS — G8929 Other chronic pain: Secondary | ICD-10-CM | POA: Diagnosis not present

## 2023-02-08 DIAGNOSIS — M6281 Muscle weakness (generalized): Secondary | ICD-10-CM | POA: Diagnosis not present

## 2023-02-08 DIAGNOSIS — M5442 Lumbago with sciatica, left side: Secondary | ICD-10-CM | POA: Diagnosis not present

## 2023-02-08 DIAGNOSIS — M4310 Spondylolisthesis, site unspecified: Secondary | ICD-10-CM | POA: Diagnosis not present

## 2023-02-08 DIAGNOSIS — M5441 Lumbago with sciatica, right side: Secondary | ICD-10-CM | POA: Diagnosis not present

## 2023-02-08 DIAGNOSIS — M48062 Spinal stenosis, lumbar region with neurogenic claudication: Secondary | ICD-10-CM | POA: Diagnosis not present

## 2023-02-10 DIAGNOSIS — M5441 Lumbago with sciatica, right side: Secondary | ICD-10-CM | POA: Diagnosis not present

## 2023-02-10 DIAGNOSIS — M48062 Spinal stenosis, lumbar region with neurogenic claudication: Secondary | ICD-10-CM | POA: Diagnosis not present

## 2023-02-10 DIAGNOSIS — M6281 Muscle weakness (generalized): Secondary | ICD-10-CM | POA: Diagnosis not present

## 2023-02-10 DIAGNOSIS — G8929 Other chronic pain: Secondary | ICD-10-CM | POA: Diagnosis not present

## 2023-02-10 DIAGNOSIS — M5442 Lumbago with sciatica, left side: Secondary | ICD-10-CM | POA: Diagnosis not present

## 2023-02-10 DIAGNOSIS — M5416 Radiculopathy, lumbar region: Secondary | ICD-10-CM | POA: Diagnosis not present

## 2023-02-10 DIAGNOSIS — M4310 Spondylolisthesis, site unspecified: Secondary | ICD-10-CM | POA: Diagnosis not present

## 2023-02-15 DIAGNOSIS — M5416 Radiculopathy, lumbar region: Secondary | ICD-10-CM | POA: Diagnosis not present

## 2023-02-15 DIAGNOSIS — M4310 Spondylolisthesis, site unspecified: Secondary | ICD-10-CM | POA: Diagnosis not present

## 2023-02-15 DIAGNOSIS — G8929 Other chronic pain: Secondary | ICD-10-CM | POA: Diagnosis not present

## 2023-02-15 DIAGNOSIS — M6281 Muscle weakness (generalized): Secondary | ICD-10-CM | POA: Diagnosis not present

## 2023-02-15 DIAGNOSIS — M48062 Spinal stenosis, lumbar region with neurogenic claudication: Secondary | ICD-10-CM | POA: Diagnosis not present

## 2023-02-15 DIAGNOSIS — M5441 Lumbago with sciatica, right side: Secondary | ICD-10-CM | POA: Diagnosis not present

## 2023-02-15 DIAGNOSIS — M5442 Lumbago with sciatica, left side: Secondary | ICD-10-CM | POA: Diagnosis not present

## 2023-02-17 DIAGNOSIS — G8929 Other chronic pain: Secondary | ICD-10-CM | POA: Diagnosis not present

## 2023-02-17 DIAGNOSIS — M5416 Radiculopathy, lumbar region: Secondary | ICD-10-CM | POA: Diagnosis not present

## 2023-02-17 DIAGNOSIS — M48062 Spinal stenosis, lumbar region with neurogenic claudication: Secondary | ICD-10-CM | POA: Diagnosis not present

## 2023-02-17 DIAGNOSIS — M4310 Spondylolisthesis, site unspecified: Secondary | ICD-10-CM | POA: Diagnosis not present

## 2023-02-17 DIAGNOSIS — M5441 Lumbago with sciatica, right side: Secondary | ICD-10-CM | POA: Diagnosis not present

## 2023-02-17 DIAGNOSIS — M6281 Muscle weakness (generalized): Secondary | ICD-10-CM | POA: Diagnosis not present

## 2023-02-17 DIAGNOSIS — M5442 Lumbago with sciatica, left side: Secondary | ICD-10-CM | POA: Diagnosis not present

## 2023-02-19 ENCOUNTER — Telehealth: Payer: Self-pay | Admitting: Family Medicine

## 2023-02-19 ENCOUNTER — Other Ambulatory Visit: Payer: Self-pay | Admitting: Family Medicine

## 2023-02-19 DIAGNOSIS — E7800 Pure hypercholesterolemia, unspecified: Secondary | ICD-10-CM

## 2023-02-19 DIAGNOSIS — M810 Age-related osteoporosis without current pathological fracture: Secondary | ICD-10-CM

## 2023-02-19 NOTE — Telephone Encounter (Signed)
Called and left VM for patient to call back.  Lindsey George

## 2023-02-19 NOTE — Telephone Encounter (Signed)
Copied from [place] [phone]. Topic: General - Inquiry >> Feb 19, 2023  1:33 PM Denman George T wrote: Reason for CRM: Patient is requesting a callback in reference to her vaccinations. She said they were highlighted from the [place].

## 2023-02-22 NOTE — Telephone Encounter (Signed)
Per last OV- continue alendronate- fails lab protocol- will RF Requested Prescriptions  Pending Prescriptions Disp Refills   alendronate (FOSAMAX) 70 MG tablet [Pharmacy Med Name: Alendronate Sodium 70 MG Oral Tablet] 12 tablet 0    Sig: TAKE 1 TABLET BY MOUTH ONCE A WEEK WITH A FULL GLASS OF WATER ON AN EMPTY STOMACH     Endocrinology:  Bisphosphonates Failed - 02/19/2023  9:14 PM      Failed - Ca in normal range and within 360 days    Calcium  Date Value Ref Range Status  11/24/2022 10.6 (H) 8.7 - 10.3 mg/dL Final   Calcium, Total  Date Value Ref Range Status  11/26/2013 9.4 8.5 - 10.1 mg/dL Final         Failed - Vitamin D in normal range and within 360 days    No results found for: "[id number]", "[id number]", "VD125OH2TOT", "25OHVITD3", "25OHVITD2", "25OHVITD1", "VD25OH"       Failed - Mg Level in normal range and within 360 days    No results found for: "MG"       Passed - Cr in normal range and within 360 days    Creatinine  Date Value Ref Range Status  11/26/2013 0.66 0.60 - 1.30 mg/dL Final   Creatinine, Ser  Date Value Ref Range Status  11/24/2022 0.62 0.57 - 1.00 mg/dL Final         Passed - Phosphate in normal range and within 360 days    Phosphorus  Date Value Ref Range Status  07/16/2022 4.2 3.0 - 4.3 mg/dL Final         Passed - eGFR is 30 or above and within 360 days    EGFR (African American)  Date Value Ref Range Status  11/26/2013 >60  Final   GFR calc Af Amer  Date Value Ref Range Status  11/11/2020 106 >59 mL/min/1.73 Final    Comment:    **In accordance with recommendations from the NKF-ASN Task force,**   [facility] is in the process of updating its eGFR calculation to the   2021 CKD-EPI creatinine equation that estimates kidney function   without a race variable.    EGFR (Non-African Amer.)  Date Value Ref Range Status  11/26/2013 >60  Final    Comment:    eGFR values <75mL/min/1.73 m2 may be an indication of chronic kidney disease  (CKD). Calculated eGFR is useful in patients with stable renal function. The eGFR calculation will not be reliable in acutely ill patients when serum creatinine is changing rapidly. It is not useful in  patients on dialysis. The eGFR calculation may not be applicable to patients at the low and high extremes of body sizes, pregnant women, and vegetarians.    GFR calc non Af Amer  Date Value Ref Range Status  11/11/2020 92 >59 mL/min/1.73 Final   eGFR  Date Value Ref Range Status  11/24/2022 95 >59 mL/min/1.73 Final         Passed - Valid encounter within last 12 months    Recent Outpatient Visits           3 months ago Type 2 diabetes mellitus without complication, without long-term current use of insulin ([place])   [place] [facility] [place], Deanna C, MD   3 months ago Pain of right lower extremity   [place] [facility], MD   3 months ago Wrist pain, acute, right  [facility] at [place], [employer], MD   3 months ago Acute cough   [place] [facility] at [facility], Earley Abide, MD   7 months ago Type 2 diabetes mellitus without complication, without long-term current use of insulin ([place])   [place] [facility] at [facility], MD       Future Appointments             In 2 weeks Juline Patch, MD [facility] at [facility], [place] Density or Dexa Scan completed in the last 2 years       simvastatin (ZOCOR) 40 MG tablet [Pharmacy Med Name: Simvastatin 40 MG Oral Tablet] 90 tablet 0    Sig: Take 1 tablet by mouth once daily     Cardiovascular:  Antilipid - Statins Failed - 02/19/2023  9:14 PM      Failed - Lipid Panel in normal range within the last 12 months    Cholesterol, Total   Date Value Ref Range Status  03/16/2022 130 100 - 199 mg/dL Final   LDL Chol Calc (NIH)  Date Value Ref Range Status  03/16/2022 61 0 - 99 mg/dL Final   HDL  Date Value Ref Range Status  03/16/2022 51 >39 mg/dL Final   Triglycerides  Date Value Ref Range Status  03/16/2022 93 0 - 149 mg/dL Final         Passed - Patient is not pregnant      Passed - Valid encounter within last 12 months    Recent Outpatient Visits           3 months ago Type 2 diabetes mellitus without complication, without long-term current use of insulin ([place])   [place] [facility] [place], [place], MD   3 months ago Pain of right lower extremity   [place] [facility] at [place], Deanna C, MD   3 months ago Wrist pain, acute, right   [facility] at [place], Deanna C, MD   3 months ago Acute cough   [place] [facility] [place] [facility], Earley Abide, MD   7 months ago Type 2 diabetes mellitus without complication, without long-term current use of insulin [facility])    Primary [facility] Medicine at [place], [place], MD       Future Appointments             In 2 weeks Juline Patch, MD [facility] at [facility], [facility]

## 2023-02-23 ENCOUNTER — Telehealth: Payer: Self-pay | Admitting: Neurosurgery

## 2023-02-23 DIAGNOSIS — M25551 Pain in right hip: Secondary | ICD-10-CM

## 2023-02-23 NOTE — Telephone Encounter (Signed)
I spoke with Lindsey George. She reports right hip lateral hip pain. This has been there, but it has gotten worse. It does radiate into her groin. It hurts mostly when she is sitting for a period for a period of time or when she goes to get out of the car after driving. She would like to have a hip xray prior to her appointment with Dr Izora Ribas, if he is agreeable to this.  She is still having the back pain, but she is able to deal with that. She is still having the right leg pain. She feels things are somewhat better than when she saw Dr Izora Ribas in 01/14/2023.   I discussed the above with Dr Izora Ribas. OK to order hip xray per Dr Izora Ribas. Orders placed. She plans to get this done in [facility] tomorrow after her PT appointment.

## 2023-02-23 NOTE — Telephone Encounter (Signed)
Pt called in requesting that prior to her appt on 02/25/23 that we send her for xrays that include an xray of the hip and lower back. Says she is having a lot of pain in those areas.  CB: [phone] (cell)

## 2023-02-24 ENCOUNTER — Ambulatory Visit
Admission: RE | Admit: 2023-02-24 | Discharge: 2023-02-24 | Disposition: A | Discharge status: Home / Self Care | Payer: Medicare PPO | Attending: Neurosurgery | Admitting: Neurosurgery

## 2023-02-24 ENCOUNTER — Ambulatory Visit
Admission: RE | Admit: 2023-02-24 | Discharge: 2023-02-24 | Disposition: A | Discharge status: Home / Self Care | Payer: Medicare PPO | Source: Ambulatory Visit | Attending: Neurosurgery | Admitting: Neurosurgery

## 2023-02-24 DIAGNOSIS — M25551 Pain in right hip: Secondary | ICD-10-CM | POA: Insufficient documentation

## 2023-02-24 DIAGNOSIS — M1611 Unilateral primary osteoarthritis, right hip: Secondary | ICD-10-CM | POA: Diagnosis not present

## 2023-02-25 ENCOUNTER — Encounter: Payer: Self-pay | Admitting: Neurosurgery

## 2023-02-25 ENCOUNTER — Ambulatory Visit: Payer: Medicare PPO | Admitting: Neurosurgery

## 2023-02-25 VITALS — BP 124/70 | HR 67 | Ht 61.0 in | Wt 174.0 lb

## 2023-02-25 DIAGNOSIS — M4316 Spondylolisthesis, lumbar region: Secondary | ICD-10-CM | POA: Diagnosis not present

## 2023-02-25 DIAGNOSIS — M48062 Spinal stenosis, lumbar region with neurogenic claudication: Secondary | ICD-10-CM

## 2023-02-25 NOTE — Progress Notes (Signed)
Referring Physician:  Juline Patch, MD [address] [place],  [place] [address]  Primary Physician:  Juline Patch, MD  History of Present Illness: 02/25/2023 She is doing approximately 50% better.  She continues to do physical therapy.  01/14/2023 She is doing somewhat better after her injection last month.  She still having pain down her right leg as described below.   11/19/2022 Lindsey George is here today with a chief complaint of  right sided low back pain that radiates into the right leg.  She has previously had a bout with this in the past.  She underwent injections in 2021 with some improvement in her symptoms.  Unfortunately, she did not like the idea of having a needle placed in her spine.  She presents this time with 2 to 3 weeks of worsening pain down her right leg particular when she stands, walks, or is otherwise active.  She has been on steroids which have helped somewhat. Bowel/Bladder Dysfunction: none  Conservative measures:  Physical therapy:  has not participated in? Multimodal medical therapy including regular antiinflammatories:  gabapentin, medrol dosepak, oxycodone Injections:  has not received any epidural steroid injections recently  Past Surgery: denies  Lindsey George has no symptoms of cervical myelopathy.  The symptoms are causing a significant impact on the patient's life.   I have utilized the care everywhere function in epic to review the outside records available from external health systems.  Review of Systems:  A 10 point review of systems is negative, except for the pertinent positives and negatives detailed in the HPI.  Past Medical History: Past Medical History:  Diagnosis Date   Asthma    Cancer ([place])    skin ca   Chronic back pain    Diabetes mellitus without complication ([place])    Hyperlipidemia    Hypertension    Obesity    Osteoporosis     Past Surgical History: Past Surgical History:   Procedure Laterality Date   ABDOMINAL HYSTERECTOMY     BREAST BIOPSY Right 06/22/11   bx/clip-neg   BREAST CYST ASPIRATION Left    neg   COLONOSCOPY  2014   cleared for 5 yrs- [place] doc   COLONOSCOPY WITH PROPOFOL N/A 03/25/2018   Procedure: COLONOSCOPY WITH PROPOFOL;  Surgeon: Manya Silvas, MD;  Location[facility]COPY;  Service: Endoscopy;  Laterality: N/A;   PARTIAL HYSTERECTOMY     SPLENECTOMY      Allergies: Allergies as of 02/25/2023 - Review Complete 02/25/2023  Allergen Reaction Noted   Codeine Other (See Comments) 08/30/2015    Medications: Current Meds  Medication Sig   albuterol (VENTOLIN HFA) 108 (90 Base) MCG/ACT inhaler Inhale 2 puffs into the lungs every 6 (six) hours as needed for wheezing or shortness of breath.   alendronate (FOSAMAX) 70 MG tablet TAKE 1 TABLET BY MOUTH ONCE A WEEK WITH A FULL GLASS OF WATER ON AN EMPTY STOMACH   aspirin 81 MG tablet Take 81 mg by mouth daily.   calcium-vitamin D (OSCAL WITH D) 500-200 MG-UNIT tablet Take 1 tablet by mouth daily with breakfast.   carbidopa-levodopa (SINEMET IR) 25-100 MG tablet Take 1 tablet by mouth in the morning and at bedtime.   Cholecalciferol (VITAMIN D3 PO) Take 1 capsule by mouth daily.   DULoxetine (CYMBALTA) 30 MG capsule Take 2 capsules daily= 60mg  a day   EPIPEN 2-PAK 0.3 MG/0.3ML SOAJ injection Inject 0.3 mg into the muscle as needed.  gabapentin (NEURONTIN) 300 MG capsule Take 1 capsule (300 mg total) by mouth 3 (three) times daily.   glipiZIDE (GLIPIZIDE XL) 5 MG 24 hr tablet Take 1 tablet (5 mg total) by mouth daily.   ibuprofen (MOTRIN IB) 200 MG tablet Take 3 tablets (600 mg total) by mouth every 8 (eight) hours as needed.   losartan (COZAAR) 25 MG tablet Take 1 tablet by mouth once daily   metFORMIN (GLUCOPHAGE) 500 MG tablet TAKE ONE TABLET BY MOUTH TWICE DAILY   Multiple Vitamin (MULTIVITAMIN) capsule Take 1 capsule by mouth daily.   ONETOUCH ULTRA test strip USE 1 STRIP TO CHECK GLUCOSE  ONCE DAILY   pioglitazone (ACTOS) 15 MG tablet Take 1 tablet (15 mg total) by mouth daily.   promethazine-dextromethorphan (PROMETHAZINE-DM) 6.25-15 MG/5ML syrup Take 5 mLs by mouth 4 (four) times daily as needed for cough.   simvastatin (ZOCOR) 40 MG tablet Take 1 tablet by mouth once daily   Current Facility-Administered Medications for the 02/25/23 encounter (Office Visit) with Meade Maw, MD  Medication   ipratropium-albuterol (DUONEB) 0.5-2.5 (3) MG/3ML nebulizer solution 3 mL    Social History: Social History   Tobacco Use   Smoking status: Never   Smokeless tobacco: Never   Tobacco comments:    smoking cessation materials not required  Vaping Use   Vaping Use: Never used  Substance Use Topics   Alcohol use: Not Currently   Drug use: Never    Family Medical History: Family History  Problem Relation Age of Onset   Breast cancer Sister 71   Cancer Sister    Heart disease Sister    Cancer Father    Stroke Father    Heart disease Sister     Physical Examination: Vitals:   02/25/23 1337  BP: 124/70  Pulse: 67  SpO2: 96%     General: Patient is well developed, well nourished, calm, collected, and in no apparent distress. Attention to examination is appropriate.  Neck:   Supple.  Full range of motion.  Respiratory: Patient is breathing without any difficulty.   NEUROLOGICAL:     Awake, alert, oriented to person, place, and time.  Speech is clear and fluent. Fund of knowledge is appropriate.   Cranial Nerves: Pupils equal round and reactive to light.  Facial tone is symmetric.  Facial sensation is symmetric. Shoulder shrug is symmetric. Tongue protrusion is midline.  There is no pronator drift.  ROM of spine: full.    Strength: Side Biceps Triceps Deltoid Interossei Grip Wrist Ext. Wrist Flex.  R 5 5 5 5 5 5 5   L 5 5 5 5 5 5 5    Side Iliopsoas Quads Hamstring PF DF EHL  R 5 5 5 5 5 5   L 5 5 5 5 5 5    Reflexes are 1+ and symmetric at the  biceps, triceps, brachioradialis, patella and achilles.   Hoffman's is present.   Bilateral upper and lower extremity sensation is intact to light touch.    No evidence of dysmetria noted.     Medical Decision Making  Imaging: CT L spine 11/10/2022 IMPRESSION: 1. Interval increase in spinal canal and right lateral recess narrowing at L4-L5 secondary to interval increase in size of a circumferential disc bulge as well as increased ligamentum flavum hypertrophy. This could account for patient's right-sided symptoms. If more definitive characterization of the degree of degenerative change is clinically warranted, further evaluation with a non contrast enhanced lumbar spine MRI is recommended. 2. No evidence  of acute fracture.   Aortic Atherosclerosis (ICD10-I70.0).     Electronically Signed   By: Marin Roberts M.D.   On: 11/10/2022 12:59  MRI L spine 12/04/2022 IMPRESSION: 1. Diffuse lumbar spine spondylosis without significant interval change compared with the prior exam. 2. At L3-4 there is a broad-based disc bulge. Moderate bilateral facet arthropathy with bilateral facet effusions and ligamentum flavum infolding. Severe spinal stenosis. Severe right foraminal stenosis. Mild left foraminal stenosis. 3. At L4-5 there is a broad-based disc bulge with a broad shallow right paracentral disc protrusion contacting the right intraspinal L5 nerve root. Moderate bilateral facet arthropathy with a small left facet effusion. Bilateral lateral recess stenosis. Mild spinal stenosis. Moderate left foraminal stenosis. Mild-moderate right foraminal stenosis. 4. At L5-S1 there is a mild broad-based disc osteophyte complex. Mild bilateral facet arthropathy. No right foraminal stenosis. Moderate left foraminal stenosis. 5. At L1-2 there is a broad-based disc bulge with a tiny left paracentral disc protrusion. Left lateral recess stenosis. Mild bilateral facet arthropathy. 6. At L2-3 there  is a broad-based disc bulge. Mild bilateral facet arthropathy. Mild-moderate spinal stenosis. 7. No acute osseous injury of the lumbar spine.     Electronically Signed   By: Kathreen Devoid M.D.   On: 12/06/2022 08:03  I have personally reviewed the images and agree with the above interpretation.  Assessment and Plan: Lindsey George is a pleasant 72 y.o. female with symptoms of lumbar radiculopathy and neurogenic claudication.    She is doing much better than she previously was.  We will see her back in approximately 3 months.  We will touch base with her once her hip x-ray results become available.  She has evidence of severe central stenosis at L3-4 and right lateral recess stenosis at L4-5 on her MRI scan.  She has an anterolisthesis at L3-4.  Will continue her exercises for now.     We discussed that there may be a surgical option for her condition.  There would be a smaller decompression option with L3-4 and L4-5 decompression with right-sided L3-4 far lateral foraminotomy versus a more extensive minimal invasive fusion from L3-5.  I think she would likely elect for the smaller version if she ultimately fails conservative management.  We did review that she may need to see physical therapy formally if she does not achieve adequate pain control with gabapentin and ibuprofen.  I spent a total of 10 minutes in this patient's care today. This time was spent reviewing pertinent records including imaging studies, obtaining and confirming history, performing a directed evaluation, formulating and discussing my recommendations, and documenting the visit within the medical record.    Thank you for involving me in the care of this patient.      Prajwal Fellner K. Izora Ribas MD, [facility]

## 2023-02-26 ENCOUNTER — Telehealth: Payer: Self-pay

## 2023-02-26 NOTE — Telephone Encounter (Signed)
Per Dr Izora Ribas, [place] to let her know that her hip xray is OK.

## 2023-03-08 DIAGNOSIS — M48062 Spinal stenosis, lumbar region with neurogenic claudication: Secondary | ICD-10-CM | POA: Diagnosis not present

## 2023-03-08 DIAGNOSIS — M6281 Muscle weakness (generalized): Secondary | ICD-10-CM | POA: Diagnosis not present

## 2023-03-08 DIAGNOSIS — M5416 Radiculopathy, lumbar region: Secondary | ICD-10-CM | POA: Diagnosis not present

## 2023-03-08 DIAGNOSIS — M5441 Lumbago with sciatica, right side: Secondary | ICD-10-CM | POA: Diagnosis not present

## 2023-03-08 DIAGNOSIS — G8929 Other chronic pain: Secondary | ICD-10-CM | POA: Diagnosis not present

## 2023-03-08 DIAGNOSIS — M4310 Spondylolisthesis, site unspecified: Secondary | ICD-10-CM | POA: Diagnosis not present

## 2023-03-08 DIAGNOSIS — M5442 Lumbago with sciatica, left side: Secondary | ICD-10-CM | POA: Diagnosis not present

## 2023-03-12 ENCOUNTER — Ambulatory Visit: Payer: Medicare PPO | Admitting: Family Medicine

## 2023-03-12 ENCOUNTER — Encounter: Payer: Self-pay | Admitting: Family Medicine

## 2023-03-12 VITALS — BP 122/70 | HR 68 | Ht 62.0 in | Wt 176.0 lb

## 2023-03-12 DIAGNOSIS — E119 Type 2 diabetes mellitus without complications: Secondary | ICD-10-CM

## 2023-03-12 DIAGNOSIS — Z23 Encounter for immunization: Secondary | ICD-10-CM | POA: Diagnosis not present

## 2023-03-12 DIAGNOSIS — E7800 Pure hypercholesterolemia, unspecified: Secondary | ICD-10-CM

## 2023-03-12 DIAGNOSIS — M5412 Radiculopathy, cervical region: Secondary | ICD-10-CM | POA: Diagnosis not present

## 2023-03-12 DIAGNOSIS — I10 Essential (primary) hypertension: Secondary | ICD-10-CM | POA: Diagnosis not present

## 2023-03-12 DIAGNOSIS — J4520 Mild intermittent asthma, uncomplicated: Secondary | ICD-10-CM | POA: Diagnosis not present

## 2023-03-12 DIAGNOSIS — M810 Age-related osteoporosis without current pathological fracture: Secondary | ICD-10-CM

## 2023-03-12 MED ORDER — GLIPIZIDE ER 5 MG PO TB24: 5.0000 mg | ORAL_TABLET | Freq: Every day | ORAL | 1 refills | Status: DC

## 2023-03-12 MED ORDER — SIMVASTATIN 40 MG PO TABS: 40.0000 mg | ORAL_TABLET | Freq: Every day | ORAL | 1 refills | Status: DC

## 2023-03-12 MED ORDER — ALENDRONATE SODIUM 70 MG PO TABS: ORAL_TABLET | ORAL | 1 refills | Status: DC

## 2023-03-12 MED ORDER — LOSARTAN POTASSIUM 25 MG PO TABS: 25.0000 mg | ORAL_TABLET | Freq: Every day | ORAL | 1 refills | Status: DC

## 2023-03-12 MED ORDER — DULOXETINE HCL 30 MG PO CPEP: ORAL_CAPSULE | ORAL | 5 refills | Status: DC

## 2023-03-12 MED ORDER — ALBUTEROL SULFATE HFA 108 (90 BASE) MCG/ACT IN AERS: 2.0000 | INHALATION_SPRAY | Freq: Four times a day (QID) | RESPIRATORY_TRACT | 2 refills | Status: DC | PRN

## 2023-03-12 MED ORDER — METFORMIN HCL 500 MG PO TABS: ORAL_TABLET | ORAL | 1 refills | Status: DC

## 2023-03-12 MED ORDER — PIOGLITAZONE HCL 15 MG PO TABS: 15.0000 mg | ORAL_TABLET | Freq: Every day | ORAL | 1 refills | Status: DC

## 2023-03-12 NOTE — Patient Instructions (Signed)

## 2023-03-12 NOTE — Progress Notes (Signed)
Date:  03/12/2023   Name:  Lindsey George   DOB:  12-27-1950   MRN:  [id number]   Chief Complaint: Medication Refill  Diabetes She presents for her follow-up diabetic visit. She has type 2 diabetes mellitus. Her disease course has been stable. Hypoglycemia symptoms include tremors. Pertinent negatives for hypoglycemia include no headaches, speech difficulty or sweats. There are no diabetic associated symptoms. Pertinent negatives for diabetes include no blurred vision, no chest pain, no fatigue, no polydipsia, no polyphagia and no polyuria. There are no hypoglycemic complications. Symptoms are stable. There are no diabetic complications. Pertinent negatives for diabetic complications include no CVA. There are no known risk factors for coronary artery disease. Current diabetic treatment includes oral agent (triple therapy). She is following a generally healthy diet. Meal planning includes avoidance of concentrated sweets and carbohydrate counting. She has not had a previous visit with a dietitian. She participates in exercise daily. An ACE inhibitor/angiotensin II receptor blocker is being taken.  Hypertension This is a chronic problem. The current episode started more than 1 year ago. The problem has been gradually improving since onset. The problem is controlled. Pertinent negatives include no anxiety, blurred vision, chest pain, headaches, malaise/fatigue, neck pain, orthopnea, palpitations, peripheral edema, PND, shortness of breath or sweats. There are no associated agents to hypertension. Past treatments include angiotensin blockers. The current treatment provides moderate improvement. There are no compliance problems.  There is no history of angina, CAD/MI or CVA. There is no history of chronic renal disease, a hypertension causing med or renovascular disease.  Hyperlipidemia She has no history of chronic renal disease. Pertinent negatives include no chest pain, myalgias or shortness of  breath. Current antihyperlipidemic treatment includes statins (simvastatin). The current treatment provides moderate improvement of lipids. There are no compliance problems.   Depression        This is a chronic problem.  The current episode started more than 1 year ago.   The onset quality is gradual.   Associated symptoms include no decreased concentration, no fatigue, no helplessness, no hopelessness, does not have insomnia, not irritable, no restlessness, no decreased interest, no appetite change, no body aches, no myalgias, no headaches, no indigestion, not sad and no suicidal ideas.  Past treatments include SSRIs - Selective serotonin reuptake inhibitors.  Compliance with treatment is good.  Previous treatment provided moderate relief.   Pertinent negatives include no anxiety.   Lab Results  Component Value Date   NA 140 11/24/2022   K 4.3 11/24/2022   CO2 25 11/24/2022   GLUCOSE 118 (H) 11/24/2022   BUN 14 11/24/2022   CREATININE 0.62 11/24/2022   CALCIUM 10.6 (H) 11/24/2022   EGFR 95 11/24/2022   GFRNONAA 92 11/11/2020   Lab Results  Component Value Date   CHOL 130 03/16/2022   HDL 51 03/16/2022   LDLCALC 61 03/16/2022   TRIG 93 03/16/2022   CHOLHDL 2.9 09/19/2018   No results found for: "TSH" Lab Results  Component Value Date   HGBA1C 7.0 (H) 11/24/2022   Lab Results  Component Value Date   WBC 12.1 (H) 08/11/2018   HGB 12.9 08/11/2018   HCT 40.8 08/11/2018   MCV 80.5 08/11/2018   PLT 394 08/11/2018   Lab Results  Component Value Date   ALT 15 11/24/2022   AST 12 11/24/2022   ALKPHOS 47 11/24/2022   BILITOT 0.4 11/24/2022   No results found for: "25OHVITD2", "25OHVITD3", "VD25OH"   Review of Systems  Constitutional:  Negative for appetite change, fatigue and malaise/fatigue.  HENT:  Negative for trouble swallowing.   Eyes:  Negative for blurred vision.  Respiratory:  Negative for cough, shortness of breath and wheezing.   Cardiovascular:  Negative for  chest pain, palpitations, orthopnea, leg swelling and PND.  Gastrointestinal:  Negative for abdominal pain and blood in stool.  Endocrine: Negative for polydipsia, polyphagia and polyuria.  Genitourinary:  Negative for vaginal discharge.  Musculoskeletal:  Negative for myalgias and neck pain.  Neurological:  Positive for tremors. Negative for syncope, speech difficulty and headaches.  Psychiatric/Behavioral:  Positive for depression. Negative for decreased concentration and suicidal ideas. The patient does not have insomnia.     Patient Active Problem List   Diagnosis Date Noted   Acute cough 11/04/2022   Reactive airway disease, mild intermittent, uncomplicated 07/16/2022   Change in bowel habits 12/10/2021   Pes anserinus bursitis of left knee 10/08/2021   DDD (degenerative disc disease), cervical 10/08/2021   Impingement syndrome of left shoulder 08/18/2021   Hand pain, left 06/26/2021   Loss of memory 06/26/2021   Tremor 06/26/2021   Lumbar stenosis with neurogenic claudication 08/27/2020   Chronic bilateral low back pain with bilateral sciatica 08/27/2020   Essential hypertension 03/06/2020   Cervical radiculopathy 03/06/2020   Asymptomatic varicose veins of both lower extremities 03/06/2020   Morbid obesity 03/29/2019   Allergic rhinitis 01/19/2018   Hx of adenomatous colonic polyps 01/14/2018   Reactive airway disease without complication 01/12/2017   Back ache 05/22/2015   Alimentary obesity 05/22/2015   Type 2 diabetes mellitus without complication, without long-term current use of insulin 04/12/2014   Hypercholesterolemia 04/12/2014   OP (osteoporosis) 04/12/2014    Allergies  Allergen Reactions   Codeine Other (See Comments)    Abdominal pain    Past Surgical History:  Procedure Laterality Date   ABDOMINAL HYSTERECTOMY     BREAST BIOPSY Right 06/22/11   bx/clip-neg   BREAST CYST ASPIRATION Left    neg   COLONOSCOPY  2014   cleared for 5 yrs- [facility] doc    COLONOSCOPY WITH PROPOFOL N/A 03/25/2018   Procedure: COLONOSCOPY WITH PROPOFOL;  Surgeon: Scot Jun, MD;  Location: [facility] ENDOSCOPY;  Service: Endoscopy;  Laterality: N/A;   PARTIAL HYSTERECTOMY     SPLENECTOMY      Social History   Tobacco Use   Smoking status: Never   Smokeless tobacco: Never   Tobacco comments:    smoking cessation materials not required  Vaping Use   Vaping Use: Never used  Substance Use Topics   Alcohol use: Not Currently   Drug use: Never     Medication list has been reviewed and updated.  Current Meds  Medication Sig   albuterol (VENTOLIN HFA) 108 (90 Base) MCG/ACT inhaler Inhale 2 puffs into the lungs every 6 (six) hours as needed for wheezing or shortness of breath.   alendronate (FOSAMAX) 70 MG tablet TAKE 1 TABLET BY MOUTH ONCE A WEEK WITH A FULL GLASS OF WATER ON AN EMPTY STOMACH   aspirin 81 MG tablet Take 81 mg by mouth daily.   calcium-vitamin D (OSCAL WITH D) 500-200 MG-UNIT tablet Take 1 tablet by mouth daily with breakfast.   carbidopa-levodopa (SINEMET IR) 25-100 MG tablet Take 1 tablet by mouth in the morning and at bedtime.   Cholecalciferol (VITAMIN D3 PO) Take 1 capsule by mouth daily.   DULoxetine (CYMBALTA) 30 MG capsule Take 2 capsules daily=  a day   EPIPEN 2-PAK  0.3 MG/0.3ML SOAJ injection Inject 0.3 mg into the muscle as needed.   gabapentin (NEURONTIN) 300 MG capsule Take 1 capsule (300 mg total) by mouth 3 (three) times daily.   glipiZIDE (GLIPIZIDE XL) 5 MG 24 hr tablet Take 1 tablet (5 mg total) by mouth daily.   ibuprofen (MOTRIN IB) 200 MG tablet Take 3 tablets (600 mg total) by mouth every 8 (eight) hours as needed.   losartan (COZAAR) 25 MG tablet Take 1 tablet by mouth once daily   metFORMIN (GLUCOPHAGE) 500 MG tablet TAKE ONE TABLET BY MOUTH TWICE DAILY   Multiple Vitamin (MULTIVITAMIN) capsule Take 1 capsule by mouth daily.   ONETOUCH ULTRA test strip USE 1 STRIP TO CHECK GLUCOSE ONCE DAILY   pioglitazone  (ACTOS) 15 MG tablet Take 1 tablet (15 mg total) by mouth daily.   simvastatin (ZOCOR) 40 MG tablet Take 1 tablet by mouth once daily   [DISCONTINUED] promethazine-dextromethorphan (PROMETHAZINE-DM) 6.25-15 MG/5ML syrup Take 5 mLs by mouth 4 (four) times daily as needed for cough.   Current Facility-Administered Medications for the 03/12/23 encounter (Office Visit) with Duanne Limerick, MD  Medication   ipratropium-albuterol (DUONEB) 0.5-2.5 (3) MG/3ML nebulizer solution 3 mL       03/12/2023   10:49 AM 11/24/2022   10:25 AM 07/16/2022   10:52 AM 03/16/2022   10:36 AM  GAD 7 : Generalized Anxiety Score  Nervous, Anxious, on Edge 0 0 0 0  Control/stop worrying 0 0 0 0  Worry too much - different things 0 0 0 0  Trouble relaxing 0 0 0 0  Restless 0 0 0 0  Easily annoyed or irritable 0 0 0 0  Afraid - awful might happen 0 0 0 0  Total GAD 7 Score 0 0 0 0  Anxiety Difficulty Not difficult at all Not difficult at all Not difficult at all Not difficult at all       03/12/2023   10:49 AM 11/24/2022   10:25 AM 07/16/2022   10:52 AM  Depression screen PHQ 2/9  Decreased Interest 0 0 0  Down, Depressed, Hopeless 0 0 0  PHQ - 2 Score 0 0 0  Altered sleeping 0 0 0  Tired, decreased energy 0 0 1  Change in appetite 0 0 0  Feeling bad or failure about yourself  0 0 0  Trouble concentrating 0 0 0  Moving slowly or fidgety/restless 0 0 0  Suicidal thoughts 0 0 0  PHQ-9 Score 0 0 1  Difficult doing work/chores Not difficult at all Not difficult at all Not difficult at all    BP Readings from Last 3 Encounters:  03/12/23 122/70  02/25/23 124/70  01/14/23 130/78    Physical Exam Vitals and nursing note reviewed. Exam conducted with a chaperone present.  Constitutional:      General: She is not irritable.She is not in acute distress.    Appearance: She is not diaphoretic.  HENT:     Head: Normocephalic and atraumatic.     Right Ear: Tympanic membrane and external ear normal.      Left Ear: Tympanic membrane and external ear normal.     Nose: Nose normal.  Eyes:     General:        Right eye: No discharge.        Left eye: No discharge.     Conjunctiva/sclera: Conjunctivae normal.     Pupils: Pupils are equal, round, and reactive to light.  Neck:  Thyroid: No thyromegaly.     Vascular: No JVD.  Cardiovascular:     Rate and Rhythm: Normal rate and regular rhythm.     Heart sounds: Normal heart sounds, S1 normal and S2 normal. No murmur heard.    No systolic murmur is present.     No diastolic murmur is present.     No friction rub. No gallop. No S3 or S4 sounds.  Pulmonary:     Effort: Pulmonary effort is normal.     Breath sounds: Normal breath sounds.  Abdominal:     General: Bowel sounds are normal.     Palpations: Abdomen is soft. There is no mass.     Tenderness: There is no abdominal tenderness. There is no guarding.  Musculoskeletal:        General: Normal range of motion.     Cervical back: Normal range of motion and neck supple.  Lymphadenopathy:     Cervical: No cervical adenopathy.  Skin:    General: Skin is warm and dry.  Neurological:     Mental Status: She is alert.     Deep Tendon Reflexes: Reflexes are normal and symmetric.     Wt Readings from Last 3 Encounters:  03/12/23 176 lb (79.8 kg)  02/25/23 174 lb (78.9 kg)  01/14/23 174 lb 3.2 oz (79 kg)    BP 122/70   Pulse 68   Ht 5\' 2"  (1.575 m)   Wt 176 lb (79.8 kg)   SpO2 96%   BMI 32.19 kg/m   Assessment and Plan:  1. Type 2 diabetes mellitus without complication, without long-term current use of insulin Chronic.  Controlled.  Stable.  Continue triple therapy with glipizide XL 5 mg once a day, metformin 500 mg twice a day, and pioglitazone 15 mg once a day.  Will check A1c for current level of control as well as evaluate kidney function with microalbuminuria. - glipiZIDE (GLIPIZIDE XL) 5 MG 24 hr tablet; Take 1 tablet (5 mg total) by mouth daily.  Dispense: 90 tablet;  Refill: 1 - metFORMIN (GLUCOPHAGE) 500 MG tablet; TAKE ONE TABLET BY MOUTH TWICE DAILY  Dispense: 180 tablet; Refill: 1 - pioglitazone (ACTOS) 15 MG tablet; Take 1 tablet (15 mg total) by mouth daily.  Dispense: 90 tablet; Refill: 1 - HgB A1c - Microalbumin / creatinine urine ratio  2. Essential hypertension Chronic.  Controlled.  Stable.  Blood pressure today is 122/70.  Asymptomatic.  Tolerating medications well.  Continue losartan 25 mg once a day. - losartan (COZAAR) 25 MG tablet; Take 1 tablet (25 mg total) by mouth daily.  Dispense: 90 tablet; Refill: 1  3. Reactive airway disease, mild intermittent, uncomplicated Chronic.  Controlled.  Stable.  Continue albuterol 2 puffs every 6 hours as needed for wheezing and shortness of breath. - albuterol (VENTOLIN HFA) 108 (90 Base) MCG/ACT inhaler; Inhale 2 puffs into the lungs every 6 (six) hours as needed for wheezing or shortness of breath.  Dispense: 1 each; Refill: 2  4. Age-related osteoporosis without current pathological fracture Chronic.  Controlled.  Stable.  Continue Fosamax 75 mg once a week. - alendronate (FOSAMAX) 70 MG tablet; TAKE 1 TABLET BY MOUTH ONCE A WEEK WITH A FULL GLASS OF WATER ON AN EMPTY STOMACH  Dispense: 12 tablet; Refill: 1  5. Cervical radiculopathy Chronic.  Controlled.  Stable.  Pain control from cervical radiculopathy is controlled with duloxetine 30 mg 2 capsules daily. - DULoxetine (CYMBALTA) 30 MG capsule; Take 2 capsules daily= 60mg   a day  Dispense: 60 capsule; Refill: 5  6. Hypercholesterolemia Chronic.  Controlled.  Stable.  Currently controlled on simvastatin 40 mg once a day.  We will recheck lipid panel for current level of control and reemphasized low-cholesterol low triglyceride dietary guidelines. - simvastatin (ZOCOR) 40 MG tablet; Take 1 tablet (40 mg total) by mouth daily.  Dispense: 90 tablet; Refill: 1 - Lipid Panel With LDL/HDL Ratio  7. Need for pneumococcal vaccination Discussed and  administered - Pneumococcal conjugate vaccine 20-valent (Prevnar 20)    Elizabeth Sauer, MD

## 2023-03-13 LAB — LIPID PANEL WITH LDL/HDL RATIO
Cholesterol, Total: 139 mg/dL (ref 100–199)
HDL: 57 mg/dL (ref >39)
LDL Chol Calc (NIH): 60 mg/dL (ref 0–99)
LDL/HDL Ratio: 1.1 ratio (ref 0.0–3.2)
Triglycerides: 122 mg/dL (ref 0–149)
VLDL Cholesterol Cal: 22 mg/dL (ref 5–40)

## 2023-03-13 LAB — MICROALBUMIN / CREATININE URINE RATIO
Creatinine, Urine: 149.9 mg/dL
Microalb/Creat Ratio: 554 mg/g creat — ABNORMAL HIGH (ref 0–29)
Microalbumin, Urine: 829.9 ug/mL

## 2023-03-13 LAB — HEMOGLOBIN A1C
Est. average glucose Bld gHb Est-mCnc: 146 mg/dL
Hgb A1c MFr Bld: 6.7 % — ABNORMAL HIGH (ref 4.8–5.6)

## 2023-03-15 ENCOUNTER — Other Ambulatory Visit: Payer: Self-pay

## 2023-03-15 DIAGNOSIS — I10 Essential (primary) hypertension: Secondary | ICD-10-CM

## 2023-03-15 MED ORDER — LOSARTAN POTASSIUM 25 MG PO TABS: 25.0000 mg | ORAL_TABLET | Freq: Two times a day (BID) | ORAL | 0 refills | Status: DC

## 2023-03-15 MED ORDER — ZOSTER VAC RECOMB ADJUVANTED 50 MCG/0.5ML IM SUSR
0.5000 mL | Freq: Once | INTRAMUSCULAR | 0 refills | Status: DC
  Filled 2023-03-19 (×2): qty 0.5, 1d supply, fill #0

## 2023-03-18 ENCOUNTER — Telehealth: Payer: Self-pay | Admitting: Family Medicine

## 2023-03-18 NOTE — Telephone Encounter (Signed)
Copied from CRM [id number]. Topic: General - Other >> Mar 18, 2023  9:45 AM Carrielelia G wrote: Reason for CRM: attn: TARA....please call pt regarding [employer]. They changed her GYN doctor.  Please call to discuss

## 2023-03-19 ENCOUNTER — Other Ambulatory Visit: Payer: Self-pay

## 2023-03-19 NOTE — Telephone Encounter (Signed)
Pt stated she would like to leave a message for Delice Bison with the name of her new OBGYN.   Gustavo Lah is her new OBGYN.  Please advise.

## 2023-04-20 ENCOUNTER — Encounter
Admission: RE | Disposition: A | Discharge status: Home / Self Care | Payer: Self-pay | Source: Home / Self Care | Attending: Gastroenterology

## 2023-04-20 ENCOUNTER — Encounter: Payer: Self-pay | Admitting: *Deleted

## 2023-04-20 ENCOUNTER — Ambulatory Visit
Admission: RE | Admit: 2023-04-20 | Discharge: 2023-04-20 | Disposition: A | Discharge status: Home / Self Care | Payer: Medicare PPO | Attending: Gastroenterology | Admitting: Gastroenterology

## 2023-04-20 ENCOUNTER — Ambulatory Visit: Payer: Medicare PPO | Admitting: Certified Registered"

## 2023-04-20 DIAGNOSIS — E119 Type 2 diabetes mellitus without complications: Secondary | ICD-10-CM | POA: Diagnosis not present

## 2023-04-20 DIAGNOSIS — M810 Age-related osteoporosis without current pathological fracture: Secondary | ICD-10-CM | POA: Insufficient documentation

## 2023-04-20 DIAGNOSIS — Z09 Encounter for follow-up examination after completed treatment for conditions other than malignant neoplasm: Secondary | ICD-10-CM | POA: Diagnosis not present

## 2023-04-20 DIAGNOSIS — Z86010 Personal history of colonic polyps: Secondary | ICD-10-CM | POA: Diagnosis not present

## 2023-04-20 DIAGNOSIS — Z6832 Body mass index (BMI) 32.0-32.9, adult: Secondary | ICD-10-CM | POA: Diagnosis not present

## 2023-04-20 DIAGNOSIS — E785 Hyperlipidemia, unspecified: Secondary | ICD-10-CM | POA: Diagnosis not present

## 2023-04-20 DIAGNOSIS — E669 Obesity, unspecified: Secondary | ICD-10-CM | POA: Diagnosis not present

## 2023-04-20 DIAGNOSIS — Z9081 Acquired absence of spleen: Secondary | ICD-10-CM | POA: Diagnosis not present

## 2023-04-20 DIAGNOSIS — I10 Essential (primary) hypertension: Secondary | ICD-10-CM | POA: Insufficient documentation

## 2023-04-20 DIAGNOSIS — Z7984 Long term (current) use of oral hypoglycemic drugs: Secondary | ICD-10-CM | POA: Diagnosis not present

## 2023-04-20 DIAGNOSIS — K640 First degree hemorrhoids: Secondary | ICD-10-CM | POA: Insufficient documentation

## 2023-04-20 DIAGNOSIS — J45909 Unspecified asthma, uncomplicated: Secondary | ICD-10-CM | POA: Diagnosis not present

## 2023-04-20 DIAGNOSIS — Z1211 Encounter for screening for malignant neoplasm of colon: Secondary | ICD-10-CM | POA: Insufficient documentation

## 2023-04-20 DIAGNOSIS — K649 Unspecified hemorrhoids: Secondary | ICD-10-CM | POA: Diagnosis not present

## 2023-04-20 DIAGNOSIS — Z85828 Personal history of other malignant neoplasm of skin: Secondary | ICD-10-CM | POA: Insufficient documentation

## 2023-04-20 HISTORY — PX: COLONOSCOPY WITH PROPOFOL: SHX5780

## 2023-04-20 LAB — GLUCOSE, CAPILLARY: Glucose-Capillary: 154 mg/dL — ABNORMAL HIGH (ref 70–99)

## 2023-04-20 SURGERY — COLONOSCOPY WITH PROPOFOL
Anesthesia: General

## 2023-04-20 MED ORDER — LIDOCAINE HCL (PF) 2 % IJ SOLN
INTRAMUSCULAR | Status: AC
  Filled 2023-04-20: qty 5

## 2023-04-20 MED ORDER — SODIUM CHLORIDE 0.9 % IV SOLN
INTRAVENOUS | Status: DC
  Administered 2023-04-20: 1000 mL via INTRAVENOUS

## 2023-04-20 MED ORDER — PROPOFOL 10 MG/ML IV BOLUS
INTRAVENOUS | Status: AC
  Filled 2023-04-20: qty 40

## 2023-04-20 MED ORDER — PROPOFOL 10 MG/ML IV BOLUS
INTRAVENOUS | Status: DC | PRN
  Administered 2023-04-20: 130 ug/kg/min via INTRAVENOUS
  Administered 2023-04-20: 90 mg via INTRAVENOUS

## 2023-04-20 MED ORDER — LIDOCAINE HCL (CARDIAC) PF 100 MG/5ML IV SOSY
PREFILLED_SYRINGE | INTRAVENOUS | Status: DC | PRN
  Administered 2023-04-20: 40 mg via INTRAVENOUS

## 2023-04-20 NOTE — Op Note (Signed)
[facility] Gastroenterology Patient Name: Lindsey George Procedure Date: 04/20/2023 7:00 AM MRN: [id number] Account #: [id number] Date of Birth: 1951-03-20 Admit Type: Outpatient Age: 72 Room: [facility] ENDO ROOM 1 Gender: Female Note Status: Finalized Instrument Name: Nelda Marseille [id number] Procedure:             Colonoscopy Indications:           High risk colon cancer surveillance: Personal history                         of colonic polyps, Last colonoscopy 5 years ago Providers:             Eather Colas MD, MD Referring MD:          Duanne Limerick, MD (Referring MD) Medicines:             Monitored Anesthesia Care Complications:         No immediate complications. Procedure:             Pre-Anesthesia Assessment:                        - Prior to the procedure, a History and Physical was                         performed, and patient medications and allergies were                         reviewed. The patient is competent. The risks and                         benefits of the procedure and the sedation options and                         risks were discussed with the patient. All questions                         were answered and informed consent was obtained.                         Patient identification and proposed procedure were                         verified by the physician, the nurse, the                         anesthesiologist, the anesthetist and the technician                         in the endoscopy suite. Mental Status Examination:                         alert and oriented. Airway Examination: normal                         oropharyngeal airway and neck mobility. Respiratory                         Examination: clear to auscultation. CV Examination:  normal. Prophylactic Antibiotics: The patient does not                         require prophylactic antibiotics. Prior                         Anticoagulants: The patient  has taken no anticoagulant                         or antiplatelet agents. ASA Grade Assessment: III - A                         patient with severe systemic disease. After reviewing                         the risks and benefits, the patient was deemed in                         satisfactory condition to undergo the procedure. The                         anesthesia plan was to use monitored anesthesia care                         (MAC). Immediately prior to administration of                         medications, the patient was re-assessed for adequacy                         to receive sedatives. The heart rate, respiratory                         rate, oxygen saturations, blood pressure, adequacy of                         pulmonary ventilation, and response to care were                         monitored throughout the procedure. The physical                         status of the patient was re-assessed after the                         procedure.                        After obtaining informed consent, the colonoscope was                         passed under direct vision. Throughout the procedure,                         the patient's blood pressure, pulse, and oxygen                         saturations were monitored continuously. The  Colonoscope was introduced through the anus and                         advanced to the the cecum, identified by appendiceal                         orifice and ileocecal valve. The colonoscopy was                         performed without difficulty. The patient tolerated                         the procedure well. The quality of the bowel                         preparation was inadequate. The ileocecal valve,                         appendiceal orifice, and rectum were photographed. Findings:      The perianal and digital rectal examinations were normal.      Internal hemorrhoids were found during retroflexion. The hemorrhoids        were Grade I (internal hemorrhoids that do not prolapse).      The exam was otherwise without abnormality on direct and retroflexion       views. Impression:            - Preparation of the colon was inadequate.                        - Internal hemorrhoids.                        - The examination was otherwise normal on direct and                         retroflexion views.                        - No specimens collected. Recommendation:        - Discharge patient to home.                        - Resume previous diet.                        - Continue present medications.                        - Repeat colonoscopy in 1 year because the bowel                         preparation was suboptimal if benefits outweigh risks                         given patient's comorbidities.                        - Return to referring physician as previously                         scheduled. Procedure  Code(s):     --- Professional ---                        G0105, Colorectal cancer screening; colonoscopy on                         individual at high risk Diagnosis Code(s):     --- Professional ---                        Z86.010, Personal history of colonic polyps                        K64.0, First degree hemorrhoids CPT copyright 2022 American Medical Association. All rights reserved. The codes documented in this report are preliminary and upon coder review may  be revised to meet current compliance requirements. Eather Colas MD, MD 04/20/2023 8:06:23 AM Number of Addenda: 0 Note Initiated On: 04/20/2023 7:00 AM Scope Withdrawal Time: 0 hours 3 minutes 54 seconds  Total Procedure Duration: 0 hours 9 minutes 27 seconds  Estimated Blood Loss:  Estimated blood loss: none.      [facility]

## 2023-04-20 NOTE — Anesthesia Preprocedure Evaluation (Signed)
Anesthesia Evaluation  Patient identified by MRN, date of birth, ID band Patient awake    Reviewed: Allergy & Precautions, NPO status , Patient's Chart, lab work & pertinent test results  History of Anesthesia Complications Negative for: history of anesthetic complications  Airway Mallampati: III  TM Distance: <3 FB Neck ROM: full    Dental  (+) Chipped, Poor Dentition, Missing   Pulmonary neg shortness of breath, asthma    Pulmonary exam normal        Cardiovascular Exercise Tolerance: Good hypertension, (-) angina (-) Past MI Normal cardiovascular exam     Neuro/Psych  Neuromuscular disease  negative psych ROS   GI/Hepatic negative GI ROS, Neg liver ROS,neg GERD  ,,  Endo/Other  diabetes, Type 2    Renal/GU negative Renal ROS  negative genitourinary   Musculoskeletal   Abdominal   Peds  Hematology negative hematology ROS (+)   Anesthesia Other Findings Past Medical History: No date: Asthma No date: Cancer (HCC)     Comment:  skin ca No date: Chronic back pain No date: Diabetes mellitus without complication (HCC) No date: Hyperlipidemia No date: Hypertension No date: Obesity No date: Osteoporosis  Past Surgical History: No date: ABDOMINAL HYSTERECTOMY 06/22/11: BREAST BIOPSY; Right     Comment:  bx/clip-neg No date: BREAST CYST ASPIRATION; Left     Comment:  neg 2014: COLONOSCOPY     Comment:  cleared for 5 yrs- [facility] doc 03/25/2018: COLONOSCOPY WITH PROPOFOL; N/A     Comment:  Procedure: COLONOSCOPY WITH PROPOFOL;  Surgeon: Scot Jun, MD;  Location: [facility] ENDOSCOPY;  Service:               Endoscopy;  Laterality: N/A; No date: PARTIAL HYSTERECTOMY No date: SPLENECTOMY  BMI    Body Mass Index: 32.44 kg/m      Reproductive/Obstetrics negative OB ROS                             Anesthesia Physical Anesthesia Plan  ASA: 3  Anesthesia Plan: General    Post-op Pain Management:    Induction: Intravenous  PONV Risk Score and Plan: Propofol infusion and TIVA  Airway Management Planned: Natural Airway and Nasal Cannula  Additional Equipment:   Intra-op Plan:   Post-operative Plan:   Informed Consent: I have reviewed the patients History and Physical, chart, labs and discussed the procedure including the risks, benefits and alternatives for the proposed anesthesia with the patient or authorized representative who has indicated his/her understanding and acceptance.     Dental Advisory Given  Plan Discussed with: Anesthesiologist, CRNA and Surgeon  Anesthesia Plan Comments: (Patient consented for risks of anesthesia including but not limited to:  - adverse reactions to medications - risk of airway placement if required - damage to eyes, teeth, lips or other oral mucosa - nerve damage due to positioning  - sore throat or hoarseness - Damage to heart, brain, nerves, lungs, other parts of body or loss of life  Patient voiced understanding.)       Anesthesia Quick Evaluation

## 2023-04-20 NOTE — Anesthesia Postprocedure Evaluation (Signed)
Anesthesia Post Note  Patient: Lindsey George  Procedure(s) Performed: COLONOSCOPY WITH PROPOFOL  Patient location during evaluation: Endoscopy Anesthesia Type: General Level of consciousness: awake and alert Pain management: pain level controlled Vital Signs Assessment: post-procedure vital signs reviewed and stable Respiratory status: spontaneous breathing, nonlabored ventilation, respiratory function stable and patient connected to nasal cannula oxygen Cardiovascular status: blood pressure returned to baseline and stable Postop Assessment: no apparent nausea or vomiting Anesthetic complications: no   No notable events documented.   Last Vitals:  Vitals:   04/20/23 0805 04/20/23 0817  BP: (!) 89/56 131/70  Pulse: 76 70  Resp: 20 (!) 25  Temp:    SpO2: 92% 95%    Last Pain:  Vitals:   04/20/23 0817  TempSrc:   PainSc: 0-No pain                 Cleda Mccreedy Ramey Schiff

## 2023-04-20 NOTE — Transfer of Care (Signed)
Immediate Anesthesia Transfer of Care Note  Patient: Lindsey George  Procedure(s) Performed: COLONOSCOPY WITH PROPOFOL  Patient Location: Endoscopy Unit  Anesthesia Type:General  Level of Consciousness: awake and drowsy  Airway & Oxygen Therapy: Patient Spontanous Breathing and Patient connected to nasal cannula oxygen  Post-op Assessment: Report given to RN and Post -op Vital signs reviewed and stable  Post vital signs: Reviewed and stable  Last Vitals:  Vitals Value Taken Time  BP 95/59 04/20/23 0808  Temp 35.8   Pulse 75 04/20/23 0806  Resp 23 04/20/23 0806  SpO2 94 % 04/20/23 0807  Vitals shown include unvalidated device data.  Last Pain:  Vitals:   04/20/23 0706  TempSrc: Temporal  PainSc: 0-No pain         Complications: No notable events documented.

## 2023-04-20 NOTE — Interval H&P Note (Signed)
History and Physical Interval Note:  04/20/2023 7:41 AM  Lindsey George  has presented today for surgery, with the diagnosis of hx of TA polyps.  The various methods of treatment have been discussed with the patient and family. After consideration of risks, benefits and other options for treatment, the patient has consented to  Procedure(s) with comments: COLONOSCOPY WITH PROPOFOL (N/A) - NEEDS AM; HAS A GRADUATION AT 6:30 PM as a surgical intervention.  The patient's history has been reviewed, patient examined, no change in status, stable for surgery.  I have reviewed the patient's chart and labs.  Questions were answered to the patient's satisfaction.     Regis Bill  Ok to proceed with colonoscopy

## 2023-04-20 NOTE — H&P (Signed)
Outpatient short stay form Pre-procedure 04/20/2023  Regis Bill, MD  Primary Physician: Duanne Limerick, MD  Reason for visit:  Surveillance colonoscopy  History of present illness:    72 y/o lady with history of DM II, hypertension, and HLD here for colonoscopy for history of adenomatous polyps. Last colonoscopy in 2019 was unremarkable. No blood thinners. No family history of GI malignancies. History of partial hysterectomy.    Current Facility-Administered Medications:    0.9 %  sodium chloride infusion, , Intravenous, Continuous, Marieli Rudy, Rossie Muskrat, MD, Last Rate: 20 mL/hr at 04/20/23 0723, 1,000 mL at 04/20/23 1610  Facility-Administered Medications Prior to Admission  Medication Dose Route Frequency Provider Last Rate Last Admin   ipratropium-albuterol (DUONEB) 0.5-2.5 (3) MG/3ML nebulizer solution 3 mL  3 mL Nebulization Once Duanne Limerick, MD       Medications Prior to Admission  Medication Sig Dispense Refill Last Dose   alendronate (FOSAMAX) 70 MG tablet TAKE 1 TABLET BY MOUTH ONCE A WEEK WITH A FULL GLASS OF WATER ON AN EMPTY STOMACH 12 tablet 1 Past Week   aspirin 81 MG tablet Take 81 mg by mouth daily.   Past Week   calcium-vitamin D (OSCAL WITH D) 500-200 MG-UNIT tablet Take 1 tablet by mouth daily with breakfast.   Past Week   carbidopa-levodopa (SINEMET IR) 25-100 MG tablet Take 1 tablet by mouth in the morning and at bedtime.   04/20/2023 at 0500   Cholecalciferol (VITAMIN D3 PO) Take 1 capsule by mouth daily.   Past Week   DULoxetine (CYMBALTA) 30 MG capsule Take 2 capsules daily= 60mg  a day 60 capsule 5 Past Week   gabapentin (NEURONTIN) 300 MG capsule Take 1 capsule (300 mg total) by mouth 3 (three) times daily. 180 capsule 1 Past Week   glipiZIDE (GLIPIZIDE XL) 5 MG 24 hr tablet Take 1 tablet (5 mg total) by mouth daily. 90 tablet 1 Past Week   losartan (COZAAR) 25 MG tablet Take 1 tablet (25 mg total) by mouth 2 (two) times daily. 180 tablet 0 04/20/2023  at 0500   metFORMIN (GLUCOPHAGE) 500 MG tablet TAKE ONE TABLET BY MOUTH TWICE DAILY 180 tablet 1 Past Week   Multiple Vitamin (MULTIVITAMIN) capsule Take 1 capsule by mouth daily.   Past Week   ONETOUCH ULTRA test strip USE 1 STRIP TO CHECK GLUCOSE ONCE DAILY 50 each 2 04/19/2023   pioglitazone (ACTOS) 15 MG tablet Take 1 tablet (15 mg total) by mouth daily. 90 tablet 1 Past Week   simvastatin (ZOCOR) 40 MG tablet Take 1 tablet (40 mg total) by mouth daily. 90 tablet 1 04/20/2023 at 0500   albuterol (VENTOLIN HFA) 108 (90 Base) MCG/ACT inhaler Inhale 2 puffs into the lungs every 6 (six) hours as needed for wheezing or shortness of breath. 1 each 2  at prn   EPIPEN 2-PAK 0.3 MG/0.3ML SOAJ injection Inject 0.3 mg into the muscle as needed.    at prn   ibuprofen (MOTRIN IB) 200 MG tablet Take 3 tablets (600 mg total) by mouth every 8 (eight) hours as needed. 100 tablet 2  at prn     Allergies  Allergen Reactions   Codeine Other (See Comments)    Abdominal pain     Past Medical History:  Diagnosis Date   Asthma    Cancer (HCC)    skin ca   Chronic back pain    Diabetes mellitus without complication (HCC)    Hyperlipidemia  Hypertension    Obesity    Osteoporosis     Review of systems:  Otherwise negative.    Physical Exam  Gen: Alert, oriented. Appears stated age.  HEENT: PERRLA. Lungs: No respiratory distress CV: RRR Abd: soft, benign, no masses Ext: No edema    Planned procedures: Proceed with colonoscopy. The patient understands the nature of the planned procedure, indications, risks, alternatives and potential complications including but not limited to bleeding, infection, perforation, damage to internal organs and possible oversedation/side effects from anesthesia. The patient agrees and gives consent to proceed.  Please refer to procedure notes for findings, recommendations and patient disposition/instructions.     Regis Bill, MD [facility]  Gastroenterology

## 2023-04-21 ENCOUNTER — Encounter: Payer: Self-pay | Admitting: Gastroenterology

## 2023-04-27 ENCOUNTER — Other Ambulatory Visit: Payer: Self-pay

## 2023-04-27 ENCOUNTER — Telehealth: Payer: Self-pay | Admitting: Family Medicine

## 2023-04-27 DIAGNOSIS — I10 Essential (primary) hypertension: Secondary | ICD-10-CM

## 2023-04-27 MED ORDER — LOSARTAN POTASSIUM 50 MG PO TABS
50.0000 mg | ORAL_TABLET | Freq: Every day | ORAL | 0 refills | Status: DC
Start: 2023-04-27 — End: 2023-07-12

## 2023-04-27 NOTE — Telephone Encounter (Signed)
Copied from CRM [id number]. Topic: General - Other >> Apr 27, 2023  9:46 AM Everette C wrote: Reason for CRM: The patient would like to be contacted by Marcelle Smiling when possible to further discuss a previously discussed contact with the patient's pharmacy   Please contact further when possible

## 2023-04-27 NOTE — Telephone Encounter (Signed)
Pt is calling to report that the insurance will not for losartan (COZAAR) 25 MG tablet [[id number]] . Pt reports that she was taking 2 pills instead of 1 pill and the medication is not able to be refilled into June. Pt reports that Dr. Yetta Barre increased the medication. Please advise [phone]

## 2023-05-11 ENCOUNTER — Other Ambulatory Visit: Payer: Self-pay

## 2023-05-11 ENCOUNTER — Ambulatory Visit (INDEPENDENT_AMBULATORY_CARE_PROVIDER_SITE_OTHER): Payer: Medicare PPO | Admitting: Neurosurgery

## 2023-05-11 VITALS — BP 130/82 | Ht 62.0 in | Wt 177.0 lb

## 2023-05-11 DIAGNOSIS — M4316 Spondylolisthesis, lumbar region: Secondary | ICD-10-CM

## 2023-05-11 DIAGNOSIS — M48062 Spinal stenosis, lumbar region with neurogenic claudication: Secondary | ICD-10-CM

## 2023-05-11 NOTE — Progress Notes (Signed)
Referring Physician:  No referring provider defined for this encounter.  Primary Physician:  Duanne Limerick, MD  History of Present Illness: 05/11/2023 Lindsey George is doing much better.  She has minimal pain. 02/25/2023 She is doing approximately 50% better.  She continues to do physical therapy.  01/14/2023 She is doing somewhat better after her injection last month.  She still having pain down her right leg as described below.   11/19/2022 Lindsey George is here today with a chief complaint of  right sided low back pain that radiates into the right leg.  She has previously had a bout with this in the past.  She underwent injections in 2021 with some improvement in her symptoms.  Unfortunately, she did not like the idea of having a needle placed in her spine.  She presents this time with 2 to 3 weeks of worsening pain down her right leg particular when she stands, walks, or is otherwise active.  She has been on steroids which have helped somewhat. Bowel/Bladder Dysfunction: none  Conservative measures:  Physical therapy:  has not participated in? Multimodal medical therapy including regular antiinflammatories:  gabapentin, medrol dosepak, oxycodone Injections:  has not received any epidural steroid injections recently  Past Surgery: denies  Lindsey George has no symptoms of cervical myelopathy.  The symptoms are causing a significant impact on the patient's life.   I have utilized the care everywhere function in epic to review the outside records available from external health systems.  Review of Systems:  A 10 point review of systems is negative, except for the pertinent positives and negatives detailed in the HPI.  Past Medical History: Past Medical History:  Diagnosis Date   Asthma    Cancer (HCC)    skin ca   Chronic back pain    Diabetes mellitus without complication (HCC)    Hyperlipidemia    Hypertension    Obesity    Osteoporosis      Past Surgical History: Past Surgical History:  Procedure Laterality Date   ABDOMINAL HYSTERECTOMY     BREAST BIOPSY Right 06/22/11   bx/clip-neg   BREAST CYST ASPIRATION Left    neg   COLONOSCOPY  2014   cleared for 5 yrs- [facility] doc   COLONOSCOPY WITH PROPOFOL N/A 03/25/2018   Procedure: COLONOSCOPY WITH PROPOFOL;  Surgeon: Scot Jun, MD;  Location: [facility] ENDOSCOPY;  Service: Endoscopy;  Laterality: N/A;   COLONOSCOPY WITH PROPOFOL N/A 04/20/2023   Procedure: COLONOSCOPY WITH PROPOFOL;  Surgeon: Regis Bill, MD;  Location: [facility] ENDOSCOPY;  Service: Endoscopy;  Laterality: N/A;  NEEDS AM; HAS A GRADUATION AT 6:30 PM   PARTIAL HYSTERECTOMY     SPLENECTOMY      Allergies: Allergies as of 05/11/2023 - Review Complete 04/20/2023  Allergen Reaction Noted   Codeine Other (See Comments) 08/30/2015    Medications: No outpatient medications have been marked as taking for the 05/11/23 encounter (Appointment) with Venetia Night, MD.   Current Facility-Administered Medications for the 05/11/23 encounter (Appointment) with Venetia Night, MD  Medication   ipratropium-albuterol (DUONEB) 0.5-2.5 (3) MG/3ML nebulizer solution 3 mL    Social History: Social History   Tobacco Use   Smoking status: Never   Smokeless tobacco: Never   Tobacco comments:    smoking cessation materials not required  Vaping Use   Vaping Use: Never used  Substance Use Topics   Alcohol use: Not Currently   Drug use: Never    Family Medical History:  Family History  Problem Relation Age of Onset   Breast cancer Sister 74   Cancer Sister    Heart disease Sister    Cancer Father    Stroke Father    Heart disease Sister     Physical Examination: There were no vitals filed for this visit.    General: Patient is well developed, well nourished, calm, collected, and in no apparent distress. Attention to examination is appropriate.  Neck:   Supple.  Full range of  motion.  Respiratory: Patient is breathing without any difficulty.   NEUROLOGICAL:     Awake, alert, oriented to person, place, and time.  Speech is clear and fluent. Fund of knowledge is appropriate.   Cranial Nerves: Pupils equal round and reactive to light.  Facial tone is symmetric.  Facial sensation is symmetric. Shoulder shrug is symmetric. Tongue protrusion is midline.  There is no pronator drift.  ROM of spine: full.    Strength: Side Biceps Triceps Deltoid Interossei Grip Wrist Ext. Wrist Flex.  R 5 5 5 5 5 5 5   L 5 5 5 5 5 5 5    Side Iliopsoas Quads Hamstring PF DF EHL  R 5 5 5 5 5 5   L 5 5 5 5 5 5    Reflexes are 1+ and symmetric at the biceps, triceps, brachioradialis, patella and achilles.   Hoffman's is present.   Bilateral upper and lower extremity sensation is intact to light touch.    No evidence of dysmetria noted.     Medical Decision Making  Imaging: CT L spine 11/10/2022 IMPRESSION: 1. Interval increase in spinal canal and right lateral recess narrowing at L4-L5 secondary to interval increase in size of a circumferential disc bulge as well as increased ligamentum flavum hypertrophy. This could account for patient's right-sided symptoms. If more definitive characterization of the degree of degenerative change is clinically warranted, further evaluation with a non contrast enhanced lumbar spine MRI is recommended. 2. No evidence of acute fracture.   Aortic Atherosclerosis (ICD10-I70.0).     Electronically Signed   By: Lorenza Cambridge M.D.   On: 11/10/2022 12:59  MRI L spine 12/04/2022 IMPRESSION: 1. Diffuse lumbar spine spondylosis without significant interval change compared with the prior exam. 2. At L3-4 there is a broad-based disc bulge. Moderate bilateral facet arthropathy with bilateral facet effusions and ligamentum flavum infolding. Severe spinal stenosis. Severe right foraminal stenosis. Mild left foraminal stenosis. 3. At L4-5 there  is a broad-based disc bulge with a broad shallow right paracentral disc protrusion contacting the right intraspinal L5 nerve root. Moderate bilateral facet arthropathy with a small left facet effusion. Bilateral lateral recess stenosis. Mild spinal stenosis. Moderate left foraminal stenosis. Mild-moderate right foraminal stenosis. 4. At L5-S1 there is a mild broad-based disc osteophyte complex. Mild bilateral facet arthropathy. No right foraminal stenosis. Moderate left foraminal stenosis. 5. At L1-2 there is a broad-based disc bulge with a tiny left paracentral disc protrusion. Left lateral recess stenosis. Mild bilateral facet arthropathy. 6. At L2-3 there is a broad-based disc bulge. Mild bilateral facet arthropathy. Mild-moderate spinal stenosis. 7. No acute osseous injury of the lumbar spine.     Electronically Signed   By: Elige Ko M.D.   On: 12/06/2022 08:03  I have personally reviewed the images and agree with the above interpretation.  Assessment and Plan: Ms. Engleman is a pleasant 72 y.o. female with symptoms of lumbar radiculopathy and neurogenic claudication.  Her symptoms are essentially resolved.  I am very  pleased with her improvements.  She will continue her exercises and nonoperative management.  Will hold off on surgical intervention for now.  I will reauthorize her parking pass for the next 3 months while she recuperates.    Given her improvements, I will see her back on an as-needed basis if her symptoms return.  I spent a total of 10 minutes in this patient's care today. This time was spent reviewing pertinent records including imaging studies, obtaining and confirming history, performing a directed evaluation, formulating and discussing my recommendations, and documenting the visit within the medical record.    Thank you for involving me in the care of this patient.      Lindsey George K. Myer Haff MD, [facility] Neurosurgery

## 2023-05-13 ENCOUNTER — Ambulatory Visit: Payer: Medicare PPO | Admitting: Neurosurgery

## 2023-05-22 ENCOUNTER — Other Ambulatory Visit: Payer: Self-pay | Admitting: Neurosurgery

## 2023-05-22 DIAGNOSIS — M5412 Radiculopathy, cervical region: Secondary | ICD-10-CM

## 2023-05-22 DIAGNOSIS — M5030 Other cervical disc degeneration, unspecified cervical region: Secondary | ICD-10-CM

## 2023-05-24 NOTE — Telephone Encounter (Signed)
I sent a one month refill of neurontin to her pharmacy, but let her know that she will need to get further refills from her PCP (he released her to be prn).

## 2023-05-24 NOTE — Telephone Encounter (Signed)
Patient notified and aware further refills need to come from PCP

## 2023-05-25 ENCOUNTER — Other Ambulatory Visit: Payer: Self-pay | Admitting: Neurosurgery

## 2023-05-25 DIAGNOSIS — Z1231 Encounter for screening mammogram for malignant neoplasm of breast: Secondary | ICD-10-CM

## 2023-05-27 ENCOUNTER — Ambulatory Visit: Payer: Self-pay

## 2023-05-27 NOTE — Telephone Encounter (Signed)
   Chief Complaint: Pain left arm. " I can't hardly lift it." Symptoms: 2 days ago Frequency: 2 days ago Pertinent Negatives: Patient denies any injury Disposition: [] ED /[] Urgent Care (no appt availability in office) / [x] Appointment(In office/virtual)/ []  [facility]/ [] Home Care/ [] Refused Recommended Disposition /[] [facility]/ []  Follow-up with PCP Additional Notes: Go to ED for worsening of symptoms.  Reason for Disposition  [1] MODERATE pain (e.g., interferes with normal activities) AND [2] present > 3 days  Answer Assessment - Initial Assessment Questions 1. ONSET: "When did the pain start?"     2 days ago 2. LOCATION: "Where is the pain located?"     Left arm 3. PAIN: "How bad is the pain?" (Scale 1-10; or mild, moderate, severe)   - MILD (1-3): Doesn't interfere with normal activities.   - MODERATE (4-7): Interferes with normal activities (e.g., work or school) or awakens from sleep.   - SEVERE (8-10): Excruciating pain, unable to do any normal activities, unable to hold a cup of water.     5-6 4. WORK OR EXERCISE: "Has there been any recent work or exercise that involved this part of the body?"     No 5. CAUSE: "What do you think is causing the arm pain?"     Unsure 6. OTHER SYMPTOMS: "Do you have any other symptoms?" (e.g., neck pain, swelling, rash, fever, numbness, weakness)     No 7. PREGNANCY: "Is there any chance you are pregnant?" "When was your last menstrual period?"     No  Protocols used: Arm Pain-A-AH

## 2023-05-28 ENCOUNTER — Ambulatory Visit: Payer: Medicare PPO | Admitting: Family Medicine

## 2023-05-28 ENCOUNTER — Ambulatory Visit
Admission: RE | Admit: 2023-05-28 | Discharge: 2023-05-28 | Disposition: A | Discharge status: Home / Self Care | Payer: Medicare PPO | Attending: Family Medicine | Admitting: Family Medicine

## 2023-05-28 ENCOUNTER — Ambulatory Visit
Admission: RE | Admit: 2023-05-28 | Discharge: 2023-05-28 | Disposition: A | Discharge status: Home / Self Care | Payer: Medicare PPO | Source: Ambulatory Visit | Attending: Family Medicine | Admitting: Family Medicine

## 2023-05-28 ENCOUNTER — Encounter: Payer: Self-pay | Admitting: Family Medicine

## 2023-05-28 VITALS — BP 128/60 | HR 93 | Ht 62.0 in | Wt 172.0 lb

## 2023-05-28 DIAGNOSIS — M19012 Primary osteoarthritis, left shoulder: Secondary | ICD-10-CM | POA: Diagnosis not present

## 2023-05-28 DIAGNOSIS — M25512 Pain in left shoulder: Secondary | ICD-10-CM

## 2023-05-28 NOTE — Progress Notes (Unsigned)
Date:  05/28/2023   Name:  Lindsey George   DOB:  [birth year]   MRN:  [id number]   Chief Complaint: Shoulder Pain  Shoulder Pain  The pain is present in the left shoulder. This is a chronic problem. The current episode started yesterday. The problem occurs intermittently. The problem has been gradually improving. The quality of the pain is described as aching. The pain is at a severity of 6/10. The pain is moderate. Associated symptoms include a limited range of motion and stiffness. Pertinent negatives include no numbness or tingling. She has tried NSAIDS for the symptoms.    Lab Results  Component Value Date   NA 140 11/24/2022   K 4.3 11/24/2022   CO2 25 11/24/2022   GLUCOSE 118 (H) 11/24/2022   BUN 14 11/24/2022   CREATININE 0.62 11/24/2022   CALCIUM 10.6 (H) 11/24/2022   EGFR 95 11/24/2022   GFRNONAA 92 11/11/2020   Lab Results  Component Value Date   CHOL 139 03/12/2023   HDL 57 03/12/2023   LDLCALC 60 03/12/2023   TRIG 122 03/12/2023   CHOLHDL 2.9 09/19/2018   No results found for: "TSH" Lab Results  Component Value Date   HGBA1C 6.7 (H) 03/12/2023   Lab Results  Component Value Date   WBC 12.1 (H) 08/11/2018   HGB 12.9 08/11/2018   HCT 40.8 08/11/2018   MCV 80.5 08/11/2018   PLT 394 08/11/2018   Lab Results  Component Value Date   ALT 15 11/24/2022   AST 12 11/24/2022   ALKPHOS 47 11/24/2022   BILITOT 0.4 11/24/2022   No results found for: "25OHVITD2", "25OHVITD3", "VD25OH"   Review of Systems  Respiratory:  Negative for shortness of breath and wheezing.   Cardiovascular:  Negative for chest pain and palpitations.  Musculoskeletal:  Positive for arthralgias, myalgias and stiffness. Negative for neck stiffness.  Neurological:  Negative for tingling and numbness.    Patient Active Problem List   Diagnosis Date Noted   Acute cough 11/04/2022   Reactive airway disease, mild intermittent, uncomplicated 07/16/2022   Change in bowel habits  12/10/2021   Pes anserinus bursitis of left knee 10/08/2021   DDD (degenerative disc disease), cervical 10/08/2021   Impingement syndrome of left shoulder 08/18/2021   Hand pain, left 06/26/2021   Loss of memory 06/26/2021   Tremor 06/26/2021   Lumbar stenosis with neurogenic claudication 08/27/2020   Chronic bilateral low back pain with bilateral sciatica 08/27/2020   Essential hypertension 03/06/2020   Cervical radiculopathy 03/06/2020   Asymptomatic varicose veins of both lower extremities 03/06/2020   Morbid obesity (HCC) 03/29/2019   Allergic rhinitis 01/19/2018   Hx of adenomatous colonic polyps 01/14/2018   Reactive airway disease without complication 01/12/2017   Back ache 05/22/2015   Alimentary obesity 05/22/2015   Type 2 diabetes mellitus without complication, without long-term current use of insulin (HCC) 04/12/2014   Hypercholesterolemia 04/12/2014   OP (osteoporosis) 04/12/2014    Allergies  Allergen Reactions   Codeine Other (See Comments)    Abdominal pain    Past Surgical History:  Procedure Laterality Date   ABDOMINAL HYSTERECTOMY     BREAST BIOPSY Right 06/22/11   bx/clip-neg   BREAST CYST ASPIRATION Left    neg   COLONOSCOPY  2014   cleared for 5 yrs- [facility] doc   COLONOSCOPY WITH PROPOFOL N/A 03/25/2018   Procedure: COLONOSCOPY WITH PROPOFOL;  Surgeon: Scot Jun, MD;  Location: [facility] ENDOSCOPY;  Service: Endoscopy;  Laterality:  N/A;   COLONOSCOPY WITH PROPOFOL N/A 04/20/2023   Procedure: COLONOSCOPY WITH PROPOFOL;  Surgeon: Regis Bill, MD;  Location: [facility] ENDOSCOPY;  Service: Endoscopy;  Laterality: N/A;  NEEDS AM; HAS A GRADUATION AT 6:30 PM   PARTIAL HYSTERECTOMY     SPLENECTOMY      Social History   Tobacco Use   Smoking status: Never   Smokeless tobacco: Never   Tobacco comments:    smoking cessation materials not required  Vaping Use   Vaping Use: Never used  Substance Use Topics   Alcohol use: Not Currently   Drug use: Never      Medication list has been reviewed and updated.  Current Meds  Medication Sig   albuterol (VENTOLIN HFA) 108 (90 Base) MCG/ACT inhaler Inhale 2 puffs into the lungs every 6 (six) hours as needed for wheezing or shortness of breath.   alendronate (FOSAMAX) 70 MG tablet TAKE 1 TABLET BY MOUTH ONCE A WEEK WITH A FULL GLASS OF WATER ON AN EMPTY STOMACH   aspirin 81 MG tablet Take 81 mg by mouth daily.   calcium-vitamin D (OSCAL WITH D) 500-200 MG-UNIT tablet Take 1 tablet by mouth daily with breakfast.   carbidopa-levodopa (SINEMET IR) 25-100 MG tablet Take 1 tablet by mouth in the morning and at bedtime.   Cholecalciferol (VITAMIN D3 PO) Take 1 capsule by mouth daily.   DULoxetine (CYMBALTA) 30 MG capsule Take 2 capsules daily= 60mg  a day   EPIPEN 2-PAK 0.3 MG/0.3ML SOAJ injection Inject 0.3 mg into the muscle as needed.   gabapentin (NEURONTIN) 300 MG capsule TAKE 1 CAPSULE BY MOUTH THREE TIMES DAILY   glipiZIDE (GLIPIZIDE XL) 5 MG 24 hr tablet Take 1 tablet (5 mg total) by mouth daily.   ibuprofen (MOTRIN IB) 200 MG tablet Take 3 tablets (600 mg total) by mouth every 8 (eight) hours as needed.   losartan (COZAAR) 50 MG tablet Take 1 tablet (50 mg total) by mouth daily.   metFORMIN (GLUCOPHAGE) 500 MG tablet TAKE ONE TABLET BY MOUTH TWICE DAILY   Multiple Vitamin (MULTIVITAMIN) capsule Take 1 capsule by mouth daily.   ONETOUCH ULTRA test strip USE 1 STRIP TO CHECK GLUCOSE ONCE DAILY   pioglitazone (ACTOS) 15 MG tablet Take 1 tablet (15 mg total) by mouth daily.   simvastatin (ZOCOR) 40 MG tablet Take 1 tablet (40 mg total) by mouth daily.   Current Facility-Administered Medications for the 05/28/23 encounter (Office Visit) with Duanne Limerick, MD  Medication   ipratropium-albuterol (DUONEB) 0.5-2.5 (3) MG/3ML nebulizer solution 3 mL       03/12/2023   10:49 AM 11/24/2022   10:25 AM 07/16/2022   10:52 AM 03/16/2022   10:36 AM  GAD 7 : Generalized Anxiety Score  Nervous, Anxious, on  Edge 0 0 0 0  Control/stop worrying 0 0 0 0  Worry too much - different things 0 0 0 0  Trouble relaxing 0 0 0 0  Restless 0 0 0 0  Easily annoyed or irritable 0 0 0 0  Afraid - awful might happen 0 0 0 0  Total GAD 7 Score 0 0 0 0  Anxiety Difficulty Not difficult at all Not difficult at all Not difficult at all Not difficult at all       03/12/2023   10:49 AM 11/24/2022   10:25 AM 07/16/2022   10:52 AM  Depression screen PHQ 2/9  Decreased Interest 0 0 0  Down, Depressed, Hopeless 0 0 0  PHQ - 2 Score 0 0 0  Altered sleeping 0 0 0  Tired, decreased energy 0 0 1  Change in appetite 0 0 0  Feeling bad or failure about yourself  0 0 0  Trouble concentrating 0 0 0  Moving slowly or fidgety/restless 0 0 0  Suicidal thoughts 0 0 0  PHQ-9 Score 0 0 1  Difficult doing work/chores Not difficult at all Not difficult at all Not difficult at all    BP Readings from Last 3 Encounters:  05/28/23 128/60  05/11/23 130/82  04/20/23 131/70    Physical Exam Vitals and nursing note reviewed.  HENT:     Head: Normocephalic.     Right Ear: Tympanic membrane normal.     Left Ear: Tympanic membrane normal.  Musculoskeletal:     Left shoulder: Tenderness present. No swelling or bony tenderness. Decreased range of motion.     Comments: Tender bicep/suprascapular  Neurological:     Mental Status: She is alert.     Wt Readings from Last 3 Encounters:  05/28/23 172 lb (78 kg)  05/11/23 177 lb (80.3 kg)  04/20/23 177 lb 5.8 oz (80.4 kg)    BP 128/60   Pulse 93   Ht 5\' 2"  (1.575 m)   Wt 172 lb (78 kg)   SpO2 95%   BMI 31.46 kg/m   Assessment and Plan:  1. Acute pain of left shoulder New onset.  Presents with similar symptoms from 08/11/2021.  Uncontrolled with pain and limited range of motion.  Patient has ibuprofen that is prescribed by Dr. Marcell Barlow that she will resume on him.  On discussion there is no true cause-and-effect although patient does suggest the possibility of  picking up a basket or wet clothing.  There was some suggestion of rotator cuff tear on the x-ray in 2022 and I suspect this has been reexacerbated.  We will obtain a shoulder x-ray to evaluate and proceed with orthopedic referral.  Review of x-ray notes no acute displaced fracture or subluxation or dislocation with mild acromioclavicular joint osteoarthritis noted.  I suspect this is soft tissue and ultimately may come down to MRI. - DG Shoulder Left - Ambulatory referral to Orthopedic Surgery    Elizabeth Sauer, MD

## 2023-05-29 ENCOUNTER — Encounter: Payer: Self-pay | Admitting: Family Medicine

## 2023-05-31 ENCOUNTER — Other Ambulatory Visit: Payer: Self-pay

## 2023-05-31 ENCOUNTER — Other Ambulatory Visit: Payer: Self-pay | Admitting: Internal Medicine

## 2023-05-31 MED ORDER — SHINGRIX 50 MCG/0.5ML IM SUSR
0.5000 mL | Freq: Once | INTRAMUSCULAR | 0 refills | Status: AC
  Filled 2023-05-31: qty 0.5, 1d supply, fill #0

## 2023-06-01 ENCOUNTER — Telehealth: Payer: Self-pay | Admitting: Family Medicine

## 2023-06-01 ENCOUNTER — Telehealth: Payer: Self-pay

## 2023-06-01 NOTE — Telephone Encounter (Signed)
Called pt with shoulder xray/ osteoarthritis- offered ortho eval, sling in the meantime and Tramadol for pain- pt declined all wanting to "see how I do this week." Also offered appt for Friday the 5 of July. She stated she is going to [place] to see her daughter.

## 2023-06-01 NOTE — Telephone Encounter (Signed)
Copied from CRM [id number]. Topic: General - Other >> Jun 01, 2023  9:24 AM Pincus Sanes wrote: Reason for CRM: Please FU with PT at [phone] very anxious about x-ray results, [phone] referral letter sent out today also.

## 2023-06-08 ENCOUNTER — Telehealth: Payer: Self-pay | Admitting: Family Medicine

## 2023-06-08 NOTE — Telephone Encounter (Signed)
Noted  KP 

## 2023-06-08 NOTE — Telephone Encounter (Signed)
Copied from CRM [id number]. Topic: General - Other >> Jun 08, 2023 12:26 PM Clide Dales wrote: Patient called to let Delice Bison know she had an appointment on 7/26 to have her shoulder looked at.

## 2023-06-28 ENCOUNTER — Encounter: Payer: Medicare PPO | Admitting: Family Medicine

## 2023-06-28 DIAGNOSIS — M7552 Bursitis of left shoulder: Secondary | ICD-10-CM | POA: Diagnosis not present

## 2023-06-28 DIAGNOSIS — E119 Type 2 diabetes mellitus without complications: Secondary | ICD-10-CM | POA: Diagnosis not present

## 2023-06-28 DIAGNOSIS — Z6832 Body mass index (BMI) 32.0-32.9, adult: Secondary | ICD-10-CM | POA: Diagnosis not present

## 2023-06-29 ENCOUNTER — Ambulatory Visit
Admission: RE | Admit: 2023-06-29 | Discharge: 2023-06-29 | Disposition: A | Discharge status: Home / Self Care | Payer: Medicare PPO | Source: Ambulatory Visit | Attending: Neurosurgery | Admitting: Neurosurgery

## 2023-06-29 DIAGNOSIS — Z1231 Encounter for screening mammogram for malignant neoplasm of breast: Secondary | ICD-10-CM | POA: Insufficient documentation

## 2023-07-06 DIAGNOSIS — G20A1 Parkinson's disease without dyskinesia, without mention of fluctuations: Secondary | ICD-10-CM | POA: Diagnosis not present

## 2023-07-06 DIAGNOSIS — G629 Polyneuropathy, unspecified: Secondary | ICD-10-CM | POA: Diagnosis not present

## 2023-07-10 ENCOUNTER — Other Ambulatory Visit: Payer: Self-pay | Admitting: Family Medicine

## 2023-07-10 DIAGNOSIS — I10 Essential (primary) hypertension: Secondary | ICD-10-CM

## 2023-07-12 ENCOUNTER — Ambulatory Visit: Payer: Medicare PPO | Admitting: Family Medicine

## 2023-07-12 ENCOUNTER — Encounter: Payer: Self-pay | Admitting: Family Medicine

## 2023-07-12 VITALS — BP 120/62 | HR 78 | Ht 62.0 in | Wt 177.0 lb

## 2023-07-12 DIAGNOSIS — E119 Type 2 diabetes mellitus without complications: Secondary | ICD-10-CM

## 2023-07-12 DIAGNOSIS — Z7984 Long term (current) use of oral hypoglycemic drugs: Secondary | ICD-10-CM | POA: Diagnosis not present

## 2023-07-12 DIAGNOSIS — N3090 Cystitis, unspecified without hematuria: Secondary | ICD-10-CM | POA: Diagnosis not present

## 2023-07-12 LAB — POCT URINALYSIS DIPSTICK
Bilirubin, UA: NEGATIVE
Glucose, UA: NEGATIVE
Ketones, UA: NEGATIVE
Nitrite, UA: NEGATIVE
Protein, UA: POSITIVE — AB
Spec Grav, UA: 1.02 (ref 1.010–1.025)
Urobilinogen, UA: 0.2 E.U./dL
pH, UA: 6 (ref 5.0–8.0)

## 2023-07-12 MED ORDER — NITROFURANTOIN MONOHYD MACRO 100 MG PO CAPS
100.0000 mg | ORAL_CAPSULE | Freq: Two times a day (BID) | ORAL | 0 refills | Status: AC
Start: 2023-07-12 — End: 2023-07-19

## 2023-07-12 NOTE — Progress Notes (Signed)
Date:  07/12/2023   Name:  Lindsey George   DOB:  1951/02/17   MRN:  [id number]   Chief Complaint: Diabetes and Urinary Tract Infection ("Feel like I got to go, but nothing comes out")  Diabetes She presents for her follow-up diabetic visit. She has type 2 diabetes mellitus. Her disease course has been stable. There are no hypoglycemic associated symptoms. There are no diabetic associated symptoms. Pertinent negatives for diabetes include no chest pain, no fatigue, no polydipsia and no polyuria. There are no hypoglycemic complications. Symptoms are stable. Pertinent negatives for diabetic complications include no CVA, heart disease, peripheral neuropathy or PVD. Current diabetic treatment includes oral agent (triple therapy). She is compliant with treatment some of the time. She is following a generally healthy diet. Meal planning includes avoidance of concentrated sweets and carbohydrate counting. She participates in exercise intermittently. Her home blood glucose trend is fluctuating minimally. An ACE inhibitor/angiotensin II receptor blocker is being taken.  Urinary Tract Infection  This is a new problem. The current episode started in the past 7 days. The problem occurs every urination. The patient is experiencing no pain. There has been no fever. Associated symptoms include chills, flank pain, frequency, hematuria, hesitancy and urgency. She has tried nothing for the symptoms. The treatment provided mild relief.    Lab Results  Component Value Date   NA 140 11/24/2022   K 4.3 11/24/2022   CO2 25 11/24/2022   GLUCOSE 118 (H) 11/24/2022   BUN 14 11/24/2022   CREATININE 0.62 11/24/2022   CALCIUM 10.6 (H) 11/24/2022   EGFR 95 11/24/2022   GFRNONAA 92 11/11/2020   Lab Results  Component Value Date   CHOL 139 03/12/2023   HDL 57 03/12/2023   LDLCALC 60 03/12/2023   TRIG 122 03/12/2023   CHOLHDL 2.9 09/19/2018   No results found for: "TSH" Lab Results  Component Value Date    HGBA1C 6.7 (H) 03/12/2023   Lab Results  Component Value Date   WBC 12.1 (H) 08/11/2018   HGB 12.9 08/11/2018   HCT 40.8 08/11/2018   MCV 80.5 08/11/2018   PLT 394 08/11/2018   Lab Results  Component Value Date   ALT 15 11/24/2022   AST 12 11/24/2022   ALKPHOS 47 11/24/2022   BILITOT 0.4 11/24/2022   No results found for: "25OHVITD2", "25OHVITD3", "VD25OH"   Review of Systems  Constitutional:  Positive for chills. Negative for fatigue.  HENT:  Negative for trouble swallowing.   Eyes:  Negative for visual disturbance.  Respiratory:  Negative for chest tightness, shortness of breath and wheezing.   Cardiovascular:  Negative for chest pain, palpitations and leg swelling.  Gastrointestinal:  Negative for blood in stool, constipation and diarrhea.  Endocrine: Negative for polydipsia and polyuria.  Genitourinary:  Positive for difficulty urinating, flank pain, frequency, hematuria, hesitancy and urgency. Negative for dysuria and vaginal bleeding.    Patient Active Problem List   Diagnosis Date Noted   Acute cough 11/04/2022   Reactive airway disease, mild intermittent, uncomplicated 07/16/2022   Change in bowel habits 12/10/2021   Pes anserinus bursitis of left knee 10/08/2021   DDD (degenerative disc disease), cervical 10/08/2021   Impingement syndrome of left shoulder 08/18/2021   Hand pain, left 06/26/2021   Loss of memory 06/26/2021   Tremor 06/26/2021   Lumbar stenosis with neurogenic claudication 08/27/2020   Chronic bilateral low back pain with bilateral sciatica 08/27/2020   Essential hypertension 03/06/2020   Cervical radiculopathy 03/06/2020  Asymptomatic varicose veins of both lower extremities 03/06/2020   Morbid obesity (HCC) 03/29/2019   Allergic rhinitis 01/19/2018   Hx of adenomatous colonic polyps 01/14/2018   Reactive airway disease without complication 01/12/2017   Back ache 05/22/2015   Alimentary obesity 05/22/2015   Type 2 diabetes mellitus without  complication, without long-term current use of insulin (HCC) 04/12/2014   Hypercholesterolemia 04/12/2014   OP (osteoporosis) 04/12/2014    Allergies  Allergen Reactions   Codeine Other (See Comments)    Abdominal pain    Past Surgical History:  Procedure Laterality Date   ABDOMINAL HYSTERECTOMY     BREAST BIOPSY Right 06/22/11   bx/clip-neg   BREAST CYST ASPIRATION Left    neg   COLONOSCOPY  2014   cleared for 5 yrs- [facility] doc   COLONOSCOPY WITH PROPOFOL N/A 03/25/2018   Procedure: COLONOSCOPY WITH PROPOFOL;  Surgeon: Scot Jun, MD;  Location: [facility] ENDOSCOPY;  Service: Endoscopy;  Laterality: N/A;   COLONOSCOPY WITH PROPOFOL N/A 04/20/2023   Procedure: COLONOSCOPY WITH PROPOFOL;  Surgeon: Regis Bill, MD;  Location: [facility] ENDOSCOPY;  Service: Endoscopy;  Laterality: N/A;  NEEDS AM; HAS A GRADUATION AT 6:30 PM   PARTIAL HYSTERECTOMY     SPLENECTOMY      Social History   Tobacco Use   Smoking status: Never   Smokeless tobacco: Never   Tobacco comments:    smoking cessation materials not required  Vaping Use   Vaping status: Never Used  Substance Use Topics   Alcohol use: Not Currently   Drug use: Never     Medication list has been reviewed and updated.  Current Meds  Medication Sig   albuterol (VENTOLIN HFA) 108 (90 Base) MCG/ACT inhaler Inhale 2 puffs into the lungs every 6 (six) hours as needed for wheezing or shortness of breath.   alendronate (FOSAMAX) 70 MG tablet TAKE 1 TABLET BY MOUTH ONCE A WEEK WITH A FULL GLASS OF WATER ON AN EMPTY STOMACH   aspirin 81 MG tablet Take 81 mg by mouth daily.   calcium-vitamin D (OSCAL WITH D) 500-200 MG-UNIT tablet Take 1 tablet by mouth daily with breakfast.   carbidopa-levodopa (SINEMET IR) 25-100 MG tablet Take 1 tablet by mouth in the morning and at bedtime.   Cholecalciferol (VITAMIN D3 PO) Take 1 capsule by mouth daily.   DULoxetine (CYMBALTA) 30 MG capsule Take 2 capsules daily= 60mg  a day   EPIPEN 2-PAK 0.3  MG/0.3ML SOAJ injection Inject 0.3 mg into the muscle as needed.   gabapentin (NEURONTIN) 300 MG capsule TAKE 1 CAPSULE BY MOUTH THREE TIMES DAILY   glipiZIDE (GLIPIZIDE XL) 5 MG 24 hr tablet Take 1 tablet (5 mg total) by mouth daily.   ibuprofen (MOTRIN IB) 200 MG tablet Take 3 tablets (600 mg total) by mouth every 8 (eight) hours as needed.   losartan (COZAAR) 50 MG tablet Take 1 tablet (50 mg total) by mouth daily.   metFORMIN (GLUCOPHAGE) 500 MG tablet TAKE ONE TABLET BY MOUTH TWICE DAILY   Multiple Vitamin (MULTIVITAMIN) capsule Take 1 capsule by mouth daily.   ONETOUCH ULTRA test strip USE 1 STRIP TO CHECK GLUCOSE ONCE DAILY   pioglitazone (ACTOS) 15 MG tablet Take 1 tablet (15 mg total) by mouth daily.   simvastatin (ZOCOR) 40 MG tablet Take 1 tablet (40 mg total) by mouth daily.   Current Facility-Administered Medications for the 07/12/23 encounter (Office Visit) with Duanne Limerick, MD  Medication   ipratropium-albuterol (DUONEB) 0.5-2.5 (3)  MG/3ML nebulizer solution 3 mL       07/12/2023    9:44 AM 03/12/2023   10:49 AM 11/24/2022   10:25 AM 07/16/2022   10:52 AM  GAD 7 : Generalized Anxiety Score  Nervous, Anxious, on Edge 0 0 0 0  Control/stop worrying 0 0 0 0  Worry too much - different things 0 0 0 0  Trouble relaxing 0 0 0 0  Restless 0 0 0 0  Easily annoyed or irritable 0 0 0 0  Afraid - awful might happen 0 0 0 0  Total GAD 7 Score 0 0 0 0  Anxiety Difficulty Not difficult at all Not difficult at all Not difficult at all Not difficult at all       07/12/2023    9:44 AM 03/12/2023   10:49 AM 11/24/2022   10:25 AM  Depression screen PHQ 2/9  Decreased Interest 0 0 0  Down, Depressed, Hopeless 0 0 0  PHQ - 2 Score 0 0 0  Altered sleeping 0 0 0  Tired, decreased energy 0 0 0  Change in appetite 0 0 0  Feeling bad or failure about yourself  0 0 0  Trouble concentrating 0 0 0  Moving slowly or fidgety/restless 0 0 0  Suicidal thoughts 0 0 0  PHQ-9 Score 0 0 0   Difficult doing work/chores Not difficult at all Not difficult at all Not difficult at all    BP Readings from Last 3 Encounters:  07/12/23 120/62  05/28/23 128/60  05/11/23 130/82    Physical Exam Vitals and nursing note reviewed. Exam conducted with a chaperone present.  Constitutional:      General: She is not in acute distress.    Appearance: She is not diaphoretic.  HENT:     Head: Normocephalic and atraumatic.     Right Ear: Tympanic membrane, ear canal and external ear normal.     Left Ear: Tympanic membrane, ear canal and external ear normal.     Nose: Nose normal.     Mouth/Throat:     Mouth: Mucous membranes are moist.  Eyes:     General:        Right eye: No discharge.        Left eye: No discharge.     Conjunctiva/sclera: Conjunctivae normal.     Pupils: Pupils are equal, round, and reactive to light.  Neck:     Thyroid: No thyromegaly.     Vascular: No JVD.  Cardiovascular:     Rate and Rhythm: Normal rate and regular rhythm.     Heart sounds: Normal heart sounds. No murmur heard.    No friction rub. No gallop.  Pulmonary:     Effort: Pulmonary effort is normal.     Breath sounds: Normal breath sounds.  Abdominal:     General: Bowel sounds are normal.     Palpations: Abdomen is soft. There is no mass.     Tenderness: There is abdominal tenderness. There is no guarding.  Musculoskeletal:        General: Normal range of motion.     Cervical back: Normal range of motion and neck supple.  Lymphadenopathy:     Cervical: No cervical adenopathy.  Skin:    General: Skin is warm and dry.  Neurological:     Mental Status: She is alert.     Deep Tendon Reflexes: Reflexes are normal and symmetric.     Wt Readings from Last 3 Encounters:  07/12/23 177 lb (80.3 kg)  05/28/23 172 lb (78 kg)  05/11/23 177 lb (80.3 kg)    BP 120/62   Pulse 78   Ht 5\' 2"  (1.575 m)   Wt 177 lb (80.3 kg)   SpO2 96%   BMI 32.37 kg/m   Assessment and Plan:  1. Type 2  diabetes mellitus without complication, without long-term current use of insulin (HCC) Chronic.  Controlled.  Stable.  Review of previous A1c at 6.7 and has always been in control range for the most part.  Will continue triple therapy as noted below and will check A1c to see what current status is.  Will recheck patient in 4 months. - HgB A1c  2. Long term (current) use of oral hypoglycemic drugs Chronic.  Controlled.  Stable.  Triple therapy with glipizide XL 5 mg, metformin 500 mg twice a day and pioglitazone 15 mg once a day.  Patient is tolerating triple therapy and will continue pending A1c. - HgB A1c  3. Cystitis New onset.  Episodic.  Without dysuria but frequency urgency and urinalysis shows presence of leukocytes.  Will check urine culture and initiate therapy with nitrofurantoin 100 mg twice a day for 7 days. - POCT urinalysis dipstick - Urine Culture - nitrofurantoin, macrocrystal-monohydrate, (MACROBID) 100 MG capsule; Take 1 capsule (100 mg total) by mouth 2 (two) times daily for 7 days.  Dispense: 14 capsule; Refill: 0    Elizabeth Sauer, MD

## 2023-07-12 NOTE — Telephone Encounter (Signed)
Requested Prescriptions  Pending Prescriptions Disp Refills   losartan (COZAAR) 50 MG tablet [Pharmacy Med Name: Losartan Potassium 50 MG Oral Tablet] 90 tablet 0    Sig: Take 1 tablet by mouth once daily     Cardiovascular:  Angiotensin Receptor Blockers Failed - 07/10/2023 12:42 PM      Failed - Cr in normal range and within 180 days    Creatinine  Date Value Ref Range Status  11/26/2013 0.66 0.60 - 1.30 mg/dL Final   Creatinine, Ser  Date Value Ref Range Status  11/24/2022 0.62 0.57 - 1.00 mg/dL Final         Failed - K in normal range and within 180 days    Potassium  Date Value Ref Range Status  11/24/2022 4.3 3.5 - 5.2 mmol/L Final  11/26/2013 3.7 3.5 - 5.1 mmol/L Final         Passed - Patient is not pregnant      Passed - Last BP in normal range    BP Readings from Last 1 Encounters:  07/12/23 120/62         Passed - Valid encounter within last 6 months    Recent Outpatient Visits           Today Type 2 diabetes mellitus without complication, without long-term current use of insulin (HCC)   [facility]   1 month ago Acute pain of left shoulder   [place] [facility]   4 months ago Type 2 diabetes mellitus without complication, without long-term current use of insulin (HCC)   [place] [facility]   7 months ago Type 2 diabetes mellitus without complication, without long-term current use of insulin (HCC)   [place] [facility]   8 months ago Pain of right lower extremity   [place] [facility]       Future Appointments             In 4 months Duanne Limerick, MD [facility] at [facility], [facility]

## 2023-07-13 ENCOUNTER — Ambulatory Visit: Payer: Medicare PPO | Admitting: Family Medicine

## 2023-07-20 ENCOUNTER — Other Ambulatory Visit
Admission: RE | Admit: 2023-07-20 | Discharge: 2023-07-20 | Disposition: A | Discharge status: Home / Self Care | Payer: Medicare PPO | Attending: Family Medicine | Admitting: Family Medicine

## 2023-07-20 ENCOUNTER — Ambulatory Visit: Payer: Medicare PPO | Admitting: Family Medicine

## 2023-07-20 ENCOUNTER — Encounter: Payer: Self-pay | Admitting: Family Medicine

## 2023-07-20 ENCOUNTER — Other Ambulatory Visit: Payer: Self-pay | Admitting: Orthopedic Surgery

## 2023-07-20 VITALS — BP 120/70 | HR 90 | Temp 99.5°F | Ht 62.0 in | Wt 177.0 lb

## 2023-07-20 DIAGNOSIS — R509 Fever, unspecified: Secondary | ICD-10-CM | POA: Diagnosis not present

## 2023-07-20 DIAGNOSIS — M5412 Radiculopathy, cervical region: Secondary | ICD-10-CM

## 2023-07-20 DIAGNOSIS — M5030 Other cervical disc degeneration, unspecified cervical region: Secondary | ICD-10-CM

## 2023-07-20 LAB — CBC WITH DIFFERENTIAL/PLATELET
Abs Immature Granulocytes: 0.02 10*3/uL (ref 0.00–0.07)
Basophils Absolute: 0 10*3/uL (ref 0.0–0.1)
Basophils Relative: 0 %
Eosinophils Absolute: 0.3 10*3/uL (ref 0.0–0.5)
Eosinophils Relative: 4 %
HCT: 39.4 % (ref 36.0–46.0)
Hemoglobin: 12.6 g/dL (ref 12.0–15.0)
Immature Granulocytes: 0 %
Lymphocytes Relative: 15 %
Lymphs Abs: 1.3 10*3/uL (ref 0.7–4.0)
MCH: 25.9 pg — ABNORMAL LOW (ref 26.0–34.0)
MCHC: 32 g/dL (ref 30.0–36.0)
MCV: 81.1 fL (ref 80.0–100.0)
Monocytes Absolute: 0.7 10*3/uL (ref 0.1–1.0)
Monocytes Relative: 9 %
Neutro Abs: 6 10*3/uL (ref 1.7–7.7)
Neutrophils Relative %: 72 %
Platelets: 351 10*3/uL (ref 150–400)
RBC: 4.86 MIL/uL (ref 3.87–5.11)
RDW: 14.2 % (ref 11.5–15.5)
WBC: 8.3 10*3/uL (ref 4.0–10.5)
nRBC: 0 % (ref 0.0–0.2)

## 2023-07-20 LAB — POCT INFLUENZA A/B
Influenza A, POC: NEGATIVE
Influenza B, POC: NEGATIVE

## 2023-07-20 LAB — POC COVID19 BINAXNOW: SARS Coronavirus 2 Ag: NEGATIVE

## 2023-07-20 MED ORDER — DOXYCYCLINE HYCLATE 100 MG PO TABS
100.0000 mg | ORAL_TABLET | Freq: Two times a day (BID) | ORAL | 0 refills | Status: DC
Start: 2023-07-20 — End: 2023-08-20

## 2023-07-20 NOTE — Telephone Encounter (Signed)
Got a refill for neurontin 300mg  tid. This hasn't been filled since 05/24/23 which was only a month supply.   Please find out how she is taking it and let me know.

## 2023-07-20 NOTE — Progress Notes (Signed)
Date:  07/20/2023   Name:  Lindsey George   DOB:  06/04/1951   MRN:  [id number]   Chief Complaint: Neck Pain (Whole neck, started yesterday evening as she was going to sleep, )  Fever  This is a new problem. The current episode started yesterday. The problem has been waxing and waning. The maximum temperature noted was 101 to 101.9 F. Associated symptoms include muscle aches. Pertinent negatives include no abdominal pain, chest pain, congestion, coughing, diarrhea, ear pain, headaches, nausea, rash, sleepiness, sore throat, urinary pain, vomiting or wheezing. She has tried nothing for the symptoms.    Lab Results  Component Value Date   NA 140 11/24/2022   K 4.3 11/24/2022   CO2 25 11/24/2022   GLUCOSE 118 (H) 11/24/2022   BUN 14 11/24/2022   CREATININE 0.62 11/24/2022   CALCIUM 10.6 (H) 11/24/2022   EGFR 95 11/24/2022   GFRNONAA 92 11/11/2020   Lab Results  Component Value Date   CHOL 139 03/12/2023   HDL 57 03/12/2023   LDLCALC 60 03/12/2023   TRIG 122 03/12/2023   CHOLHDL 2.9 09/19/2018   No results found for: "TSH" Lab Results  Component Value Date   HGBA1C 7.0 (H) 07/12/2023   Lab Results  Component Value Date   WBC 12.1 (H) 08/11/2018   HGB 12.9 08/11/2018   HCT 40.8 08/11/2018   MCV 80.5 08/11/2018   PLT 394 08/11/2018   Lab Results  Component Value Date   ALT 15 11/24/2022   AST 12 11/24/2022   ALKPHOS 47 11/24/2022   BILITOT 0.4 11/24/2022   No results found for: "25OHVITD2", "25OHVITD3", "VD25OH"   Review of Systems  Constitutional:  Positive for chills, fatigue and fever. Negative for diaphoresis.  HENT:  Negative for congestion, ear pain, postnasal drip, rhinorrhea, sinus pressure, sinus pain and sore throat.   Respiratory:  Negative for cough, chest tightness, shortness of breath and wheezing.   Cardiovascular:  Negative for chest pain and palpitations.  Gastrointestinal:  Negative for abdominal pain, diarrhea, nausea and vomiting.   Genitourinary:  Negative for dysuria.  Skin:  Negative for rash.  Neurological:  Negative for headaches.    Patient Active Problem List   Diagnosis Date Noted   Acute cough 11/04/2022   Reactive airway disease, mild intermittent, uncomplicated 07/16/2022   Change in bowel habits 12/10/2021   Pes anserinus bursitis of left knee 10/08/2021   DDD (degenerative disc disease), cervical 10/08/2021   Impingement syndrome of left shoulder 08/18/2021   Hand pain, left 06/26/2021   Loss of memory 06/26/2021   Tremor 06/26/2021   Lumbar stenosis with neurogenic claudication 08/27/2020   Chronic bilateral low back pain with bilateral sciatica 08/27/2020   Essential hypertension 03/06/2020   Cervical radiculopathy 03/06/2020   Asymptomatic varicose veins of both lower extremities 03/06/2020   Morbid obesity (HCC) 03/29/2019   Allergic rhinitis 01/19/2018   Hx of adenomatous colonic polyps 01/14/2018   Reactive airway disease without complication 01/12/2017   Back ache 05/22/2015   Alimentary obesity 05/22/2015   Type 2 diabetes mellitus without complication, without long-term current use of insulin (HCC) 04/12/2014   Hypercholesterolemia 04/12/2014   OP (osteoporosis) 04/12/2014    Allergies  Allergen Reactions   Codeine Other (See Comments)    Abdominal pain    Past Surgical History:  Procedure Laterality Date   ABDOMINAL HYSTERECTOMY     BREAST BIOPSY Right 06/22/11   bx/clip-neg   BREAST CYST ASPIRATION Left  neg   COLONOSCOPY  2014   cleared for 5 yrs- [facility] doc   COLONOSCOPY WITH PROPOFOL N/A 03/25/2018   Procedure: COLONOSCOPY WITH PROPOFOL;  Surgeon: Scot Jun, MD;  Location: [facility] ENDOSCOPY;  Service: Endoscopy;  Laterality: N/A;   COLONOSCOPY WITH PROPOFOL N/A 04/20/2023   Procedure: COLONOSCOPY WITH PROPOFOL;  Surgeon: Regis Bill, MD;  Location: [facility] ENDOSCOPY;  Service: Endoscopy;  Laterality: N/A;  NEEDS AM; HAS A GRADUATION AT 6:30 PM   PARTIAL  HYSTERECTOMY     SPLENECTOMY      Social History   Tobacco Use   Smoking status: Never   Smokeless tobacco: Never   Tobacco comments:    smoking cessation materials not required  Vaping Use   Vaping status: Never Used  Substance Use Topics   Alcohol use: Not Currently   Drug use: Never     Medication list has been reviewed and updated.  No outpatient medications have been marked as taking for the 07/20/23 encounter (Office Visit) with Duanne Limerick, MD.   Current Facility-Administered Medications for the 07/20/23 encounter (Office Visit) with Duanne Limerick, MD  Medication   ipratropium-albuterol (DUONEB) 0.5-2.5 (3) MG/3ML nebulizer solution 3 mL       07/20/2023   11:14 AM 07/12/2023    9:44 AM 03/12/2023   10:49 AM 11/24/2022   10:25 AM  GAD 7 : Generalized Anxiety Score  Nervous, Anxious, on Edge 0 0 0 0  Control/stop worrying 0 0 0 0  Worry too much - different things 0 0 0 0  Trouble relaxing 0 0 0 0  Restless 0 0 0 0  Easily annoyed or irritable 0 0 0 0  Afraid - awful might happen 0 0 0 0  Total GAD 7 Score 0 0 0 0  Anxiety Difficulty Not difficult at all Not difficult at all Not difficult at all Not difficult at all       07/20/2023   11:13 AM 07/12/2023    9:44 AM 03/12/2023   10:49 AM  Depression screen PHQ 2/9  Decreased Interest 0 0 0  Down, Depressed, Hopeless 0 0 0  PHQ - 2 Score 0 0 0  Altered sleeping 0 0 0  Tired, decreased energy 0 0 0  Change in appetite 0 0 0  Feeling bad or failure about yourself  0 0 0  Trouble concentrating 0 0 0  Moving slowly or fidgety/restless 0 0 0  Suicidal thoughts 0 0 0  PHQ-9 Score 0 0 0  Difficult doing work/chores Not difficult at all Not difficult at all Not difficult at all    BP Readings from Last 3 Encounters:  07/20/23 120/70  07/12/23 120/62  05/28/23 128/60    Physical Exam Vitals and nursing note reviewed. Exam conducted with a chaperone present.  Constitutional:      General: She is not  in acute distress.    Appearance: She is not diaphoretic.  HENT:     Head: Normocephalic and atraumatic.     Right Ear: Tympanic membrane and external ear normal.     Left Ear: Tympanic membrane and external ear normal.     Nose: Nose normal. No congestion or rhinorrhea.     Mouth/Throat:     Mouth: Mucous membranes are moist.     Pharynx: No oropharyngeal exudate or posterior oropharyngeal erythema.  Eyes:     General:        Right eye: No discharge.  Left eye: No discharge.     Conjunctiva/sclera: Conjunctivae normal.     Pupils: Pupils are equal, round, and reactive to light.  Neck:     Thyroid: No thyromegaly.     Vascular: No JVD.  Cardiovascular:     Rate and Rhythm: Normal rate and regular rhythm.     Heart sounds: Normal heart sounds. No murmur heard.    No friction rub. No gallop.  Pulmonary:     Effort: Pulmonary effort is normal.     Breath sounds: Normal breath sounds. No wheezing, rhonchi or rales.  Abdominal:     General: Bowel sounds are normal.     Palpations: Abdomen is soft. There is no mass.     Tenderness: There is no abdominal tenderness. There is no guarding.  Musculoskeletal:        General: Normal range of motion.     Cervical back: Normal range of motion and neck supple. No rigidity.  Lymphadenopathy:     Cervical: No cervical adenopathy.  Skin:    General: Skin is warm and dry.     Findings: No rash.  Neurological:     General: No focal deficit present.     Mental Status: She is alert and oriented to person, place, and time.     Deep Tendon Reflexes: Reflexes are normal and symmetric.     Wt Readings from Last 3 Encounters:  07/20/23 177 lb (80.3 kg)  07/12/23 177 lb (80.3 kg)  05/28/23 172 lb (78 kg)    BP 120/70   Pulse 90   Temp 99.5 F (37.5 C)   Ht 5\' 2"  (1.575 m)   Wt 177 lb (80.3 kg)   SpO2 94%   BMI 32.37 kg/m   Assessment and Plan:  1. Fever and chills New onset.  Episodic.  Stable.  New onset of fever and chills  last night with muscle aches and in particular some discomfort in the neck.  There is no nuchal rigidity and patient is able to touch chin to chest.  CBC was done and there was no leukocytosis.  Physical exam noted lungs to be clear throat to be normal no presence of rash.  Given the tick season and tickborne illnesses we will initiate doxycycline 100 mg twice a day and will recheck on as-needed basis. - POC COVID-19 - POCT Influenza A/B - doxycycline (VIBRA-TABS) 100 MG tablet; Take 1 tablet (100 mg total) by mouth 2 (two) times daily.  Dispense: 20 tablet; Refill: 0    Elizabeth Sauer, MD

## 2023-07-21 ENCOUNTER — Ambulatory Visit (INDEPENDENT_AMBULATORY_CARE_PROVIDER_SITE_OTHER): Payer: Medicare PPO

## 2023-07-21 ENCOUNTER — Other Ambulatory Visit: Payer: Self-pay

## 2023-07-21 ENCOUNTER — Telehealth: Payer: Self-pay

## 2023-07-21 VITALS — Ht 62.0 in | Wt 177.0 lb

## 2023-07-21 DIAGNOSIS — Z0000 Encounter for general adult medical examination without abnormal findings: Secondary | ICD-10-CM

## 2023-07-21 DIAGNOSIS — M810 Age-related osteoporosis without current pathological fracture: Secondary | ICD-10-CM

## 2023-07-21 NOTE — Telephone Encounter (Signed)
Left a message for the patient to call back.  

## 2023-07-21 NOTE — Progress Notes (Signed)
Subjective:   Lindsey George is a 72 y.o. female who presents for Medicare Annual (Subsequent) preventive examination.  Visit Complete: Virtual  I connected with  Trudee Kuster on 07/21/23 by a audio enabled telemedicine application and verified that I am speaking with the correct person using two identifiers.  Patient Location: Home  Provider Location: Office/Clinic  I discussed the limitations of evaluation and management by telemedicine. The patient expressed understanding and agreed to proceed.  Cardiac Risk Factors include: advanced age (>41men, >35 women);diabetes mellitus;dyslipidemia;hypertension;obesity (BMI >30kg/m2)     Objective:    Today's Vitals   07/21/23 0856  Weight: 177 lb (80.3 kg)  Height: 5\' 2"  (1.575 m)   Body mass index is 32.37 kg/m.     07/21/2023    9:04 AM 04/20/2023    7:04 AM 11/10/2022   12:06 PM 06/11/2021    9:31 AM 06/10/2020    8:39 AM 06/05/2019    8:51 AM 06/01/2018    3:04 PM  Advanced Directives  Does Patient Have a Medical Advance Directive? No No No No No No No  Would patient like information on creating a medical advance directive? No - Patient declined   Yes (MAU/Ambulatory/Procedural Areas - Information given) No - Patient declined Yes (MAU/Ambulatory/Procedural Areas - Information given) Yes (MAU/Ambulatory/Procedural Areas - Information given)    Current Medications (verified) Outpatient Encounter Medications as of 07/21/2023  Medication Sig   albuterol (VENTOLIN HFA) 108 (90 Base) MCG/ACT inhaler Inhale 2 puffs into the lungs every 6 (six) hours as needed for wheezing or shortness of breath.   alendronate (FOSAMAX) 70 MG tablet TAKE 1 TABLET BY MOUTH ONCE A WEEK WITH A FULL GLASS OF WATER ON AN EMPTY STOMACH   aspirin 81 MG tablet Take 81 mg by mouth daily.   calcium-vitamin D (OSCAL WITH D) 500-200 MG-UNIT tablet Take 1 tablet by mouth daily with breakfast.   carbidopa-levodopa (SINEMET IR) 25-100 MG tablet Take 1  tablet by mouth in the morning and at bedtime.   Cholecalciferol (VITAMIN D3 PO) Take 1 capsule by mouth daily.   doxycycline (VIBRA-TABS) 100 MG tablet Take 1 tablet (100 mg total) by mouth 2 (two) times daily.   DULoxetine (CYMBALTA) 30 MG capsule Take 2 capsules daily= 60mg  a day   EPIPEN 2-PAK 0.3 MG/0.3ML SOAJ injection Inject 0.3 mg into the muscle as needed.   gabapentin (NEURONTIN) 300 MG capsule TAKE 1 CAPSULE BY MOUTH THREE TIMES DAILY   glipiZIDE (GLIPIZIDE XL) 5 MG 24 hr tablet Take 1 tablet (5 mg total) by mouth daily.   ibuprofen (MOTRIN IB) 200 MG tablet Take 3 tablets (600 mg total) by mouth every 8 (eight) hours as needed.   losartan (COZAAR) 50 MG tablet Take 1 tablet by mouth once daily   metFORMIN (GLUCOPHAGE) 500 MG tablet TAKE ONE TABLET BY MOUTH TWICE DAILY   Multiple Vitamin (MULTIVITAMIN) capsule Take 1 capsule by mouth daily.   ONETOUCH ULTRA test strip USE 1 STRIP TO CHECK GLUCOSE ONCE DAILY   pioglitazone (ACTOS) 15 MG tablet Take 1 tablet (15 mg total) by mouth daily.   simvastatin (ZOCOR) 40 MG tablet Take 1 tablet (40 mg total) by mouth daily.   Facility-Administered Encounter Medications as of 07/21/2023  Medication   ipratropium-albuterol (DUONEB) 0.5-2.5 (3) MG/3ML nebulizer solution 3 mL    Allergies (verified) Codeine   History: Past Medical History:  Diagnosis Date   Asthma    Cancer (HCC)    skin ca  Chronic back pain    Diabetes mellitus without complication (HCC)    Hyperlipidemia    Hypertension    Obesity    Osteoporosis    Past Surgical History:  Procedure Laterality Date   ABDOMINAL HYSTERECTOMY     BREAST BIOPSY Right 06/22/11   bx/clip-neg   BREAST CYST ASPIRATION Left    neg   COLONOSCOPY  2014   cleared for 5 yrs- [facility] doc   COLONOSCOPY WITH PROPOFOL N/A 03/25/2018   Procedure: COLONOSCOPY WITH PROPOFOL;  Surgeon: Scot Jun, MD;  Location: [facility] ENDOSCOPY;  Service: Endoscopy;  Laterality: N/A;   COLONOSCOPY WITH  PROPOFOL N/A 04/20/2023   Procedure: COLONOSCOPY WITH PROPOFOL;  Surgeon: Regis Bill, MD;  Location: [facility] ENDOSCOPY;  Service: Endoscopy;  Laterality: N/A;  NEEDS AM; HAS A GRADUATION AT 6:30 PM   PARTIAL HYSTERECTOMY     SPLENECTOMY     Family History  Problem Relation Age of Onset   Breast cancer Sister 33   Cancer Sister    Heart disease Sister    Cancer Father    Stroke Father    Heart disease Sister    Social History   Socioeconomic History   Marital status: Married    Spouse name: Andreal Vandeloo   Number of children: 2   Years of education: 14   Highest education level: Associate degree: academic program  Occupational History   Occupation: Retired  Tobacco Use   Smoking status: Never   Smokeless tobacco: Never   Tobacco comments:    smoking cessation materials not required  Vaping Use   Vaping status: Never Used  Substance and Sexual Activity   Alcohol use: Not Currently   Drug use: Never   Sexual activity: Yes    Partners: Male  Other Topics Concern   Not on file  Social History Narrative   Not on file   Social Determinants of Health   Financial Resource Strain: Low Risk  (07/21/2023)   Overall Financial Resource Strain (CARDIA)    Difficulty of Paying Living Expenses: Not hard at all  Food Insecurity: No Food Insecurity (07/21/2023)   Hunger Vital Sign    Worried About Running Out of Food in the Last Year: Never true    Ran Out of Food in the Last Year: Never true  Transportation Needs: No Transportation Needs (07/21/2023)   PRAPARE - Administrator, Civil Service (Medical): No    Lack of Transportation (Non-Medical): No  Physical Activity: Sufficiently Active (07/21/2023)   Exercise Vital Sign    Days of Exercise per Week: 5 days    Minutes of Exercise per Session: 30 min  Stress: No Stress Concern Present (07/21/2023)   [employer] of Occupational Health - Occupational Stress Questionnaire    Feeling of Stress : Only a little   Social Connections: Moderately Integrated (07/21/2023)   Social Connection and Isolation Panel [NHANES]    Frequency of Communication with Friends and Family: More than three times a week    Frequency of Social Gatherings with Friends and Family: Three times a week    Attends Religious Services: More than 4 times per year    Active Member of Clubs or Organizations: No    Attends Banker Meetings: Never    Marital Status: Married    Tobacco Counseling Counseling given: Not Answered Tobacco comments: smoking cessation materials not required   Clinical Intake:  Pre-visit preparation completed: No  Pain : No/denies pain  BMI - recorded: 32.37 Nutritional Status: BMI > 30  Obese Nutritional Risks: None Diabetes: Yes CBG done?: No  How often do you need to have someone help you when you read instructions, pamphlets, or other written materials from your doctor or pharmacy?: 1 - Never  Interpreter Needed?: No  Information entered by :: Arthur Holms   Activities of Daily Living    07/21/2023    8:59 AM  In your present state of health, do you have any difficulty performing the following activities:  Hearing? 0  Vision? 0  Difficulty concentrating or making decisions? 0  Walking or climbing stairs? 0  Dressing or bathing? 0  Doing errands, shopping? 0  Preparing Food and eating ? N  Using the Toilet? N  In the past six months, have you accidently leaked urine? N  Do you have problems with loss of bowel control? N  Managing your Medications? N  Managing your Finances? N  Housekeeping or managing your Housekeeping? N    Patient Care Team: Duanne Limerick, MD as PCP - General (Family Medicine) Donnajean Lopes (Physician Assistant) Dedra Skeens, PA-C (Orthopedic Surgery)  Indicate any recent Medical Services you may have received from other than [facility] providers in the past year (date may be approximate).     Assessment:   This is a routine wellness  examination for [place].  Hearing/Vision screen Hearing Screening - Comments:: Pt hearing well over phone Vision Screening - Comments:: Wears readers  Dietary issues and exercise activities discussed:     Goals Addressed               This Visit's Progress     Exercise 3x per week (30 min per time) (pt-stated)        "Be in good health"       Depression Screen    07/21/2023    9:06 AM 07/21/2023    9:03 AM 07/20/2023   11:13 AM 07/12/2023    9:44 AM 03/12/2023   10:49 AM 11/24/2022   10:25 AM 07/16/2022   10:52 AM  PHQ 2/9 Scores  PHQ - 2 Score 0 0 0 0 0 0 0  PHQ- 9 Score 0 0 0 0 0 0 1    Fall Risk    07/21/2023    9:05 AM 07/20/2023   11:14 AM 07/12/2023    9:44 AM 11/24/2022   10:24 AM 07/16/2022   10:51 AM  Fall Risk   Falls in the past year? 0 0 0 0 0  Number falls in past yr: 0 0 0 0 0  Injury with Fall? 0 0 0 0 0  Risk for fall due to : No Fall Risks  No Fall Risks  No Fall Risks  Follow up Falls evaluation completed  Falls evaluation completed Falls evaluation completed Falls evaluation completed    MEDICARE RISK AT HOME: Medicare Risk at Home Any stairs in or around the home?: No If so, are there any without handrails?: No Home free of loose throw rugs in walkways, pet beds, electrical cords, etc?: Yes Adequate lighting in your home to reduce risk of falls?: Yes Life alert?: No (advised to get) Use of a cane, walker or w/c?: No Grab bars in the bathroom?: No Shower chair or bench in shower?: Yes Elevated toilet seat or a handicapped toilet?: No   Cognitive Function:        07/21/2023    9:06 AM 06/22/2022    9:13 AM 06/10/2020  8:41 AM 06/05/2019    8:55 AM 06/01/2018    3:08 PM  6CIT Screen  What Year? 0 points 0 points 0 points 0 points 0 points  What month? 0 points 0 points 0 points 0 points 0 points  What time? 0 points 0 points 0 points 0 points 0 points  Count back from 20 0 points 0 points 0 points 0 points 0 points  Months in reverse 0  points 0 points 0 points 0 points 0 points  Repeat phrase 0 points 0 points 0 points 0 points 2 points  Total Score 0 points 0 points 0 points 0 points 2 points    Immunizations Immunization History  Administered Date(s) Administered   COVID-19, mRNA, vaccine(Comirnaty)12 years and older 09/22/2022   Fluad Quad(high Dose 65+) 09/01/2019, 11/11/2020, 09/10/2021, 09/18/2022   Influenza, High Dose Seasonal PF 08/05/2017, 09/19/2018   Influenza,inj,Quad PF,6+ Mos 08/19/2016   PFIZER Comirnaty(Gray Top)Covid-19 Tri-Sucrose Vaccine 10/03/2020   PFIZER(Purple Top)SARS-COV-2 Vaccination 01/05/2020, 01/26/2020, 03/18/2021   PNEUMOCOCCAL CONJUGATE-20 03/12/2023   Pfizer Covid-19 Vaccine Bivalent Booster 61yrs & up 10/09/2021   Pneumococcal Conjugate-13 09/03/2015   Pneumococcal Polysaccharide-23 09/30/2009, 08/05/2017   Td 07/31/2004   Tdap 08/19/2016   Zoster Recombinant(Shingrix) 03/19/2023, 05/31/2023   Zoster, Live 07/20/2016    TDAP status: Up to date  Flu Vaccine status: Up to date  Pneumococcal vaccine status: Up to date  Covid-19 vaccine status: Completed vaccines  Qualifies for Shingles Vaccine? No   Zostavax completed Yes   Shingrix Completed?: Yes  Screening Tests Health Maintenance  Topic Date Due   FOOT EXAM  07/28/2023 (Originally 07/17/2023)   COVID-19 Vaccine (7 - 2023-24 season) 07/28/2023 (Originally 01/23/2023)   INFLUENZA VACCINE  02/28/2024 (Originally 07/01/2023)   Diabetic kidney evaluation - eGFR measurement  11/25/2023   OPHTHALMOLOGY EXAM  12/05/2023   HEMOGLOBIN A1C  01/12/2024   Diabetic kidney evaluation - Urine ACR  03/11/2024   MAMMOGRAM  06/28/2024   Medicare Annual Wellness (AWV)  07/20/2024   DTaP/Tdap/Td (3 - Td or Tdap) 08/19/2026   Colonoscopy  04/19/2028   Pneumonia Vaccine 107+ Years old  Completed   DEXA SCAN  Completed   Hepatitis C Screening  Completed   Zoster Vaccines- Shingrix  Completed   HPV VACCINES  Aged Out    Health  Maintenance  There are no preventive care reminders to display for this patient.   Colorectal cancer screening: Type of screening: Colonoscopy. Completed 04/20/23. Repeat every 1 years  Mammogram status: Completed 06/29/23. Repeat every year  Bone Density status: Completed 07/22/2021. Results reflect: Bone density results: NORMAL. Repeat every 2 years.  Lung Cancer Screening: (Low Dose CT Chest recommended if Age 44-80 years, 20 pack-year currently smoking OR have quit w/in 15years.) does not qualify.    Additional Screening:  Hepatitis C Screening: does not qualify; Completed 08/05/17  Vision Screening: Recommended annual ophthalmology exams for early detection of glaucoma and other disorders of the eye. Is the patient up to date with their annual eye exam?  Yes  Who is the provider or what is the name of the office in which the patient attends annual eye exams? Dr Brooke Dare   Dental Screening: Recommended annual dental exams for proper oral hygiene-sees Dr Malen Gauze  Diabetic Foot Exam: Diabetic Foot Exam: Overdue, Pt has been advised about the importance in completing this exam. Pt is scheduled for diabetic foot exam on 08/03/2023.  Community Resource Referral / Chronic Care Management: CRR required this visit?  No  CCM required this visit?  No     Plan:     I have personally reviewed and noted the following in the patient's chart:   Medical and social history Use of alcohol, tobacco or illicit drugs  Current medications and supplements including opioid prescriptions. Patient is not currently taking opioid prescriptions. Functional ability and status Nutritional status Physical activity Advanced directives List of other physicians Hospitalizations, surgeries, and ER visits in previous 12 months Vitals Screenings to include cognitive, depression, and falls Referrals and appointments  In addition, I have reviewed and discussed with patient certain preventive  protocols, quality metrics, and best practice recommendations. A written personalized care plan for preventive services as well as general preventive health recommendations were provided to patient.     Everitt Amber   07/21/2023   After Visit Summary: (MyChart) Due to this being a telephonic visit, the after visit summary with patients personalized plan was offered to patient via MyChart   Nurse Notes: bone density ordered

## 2023-07-21 NOTE — Patient Instructions (Signed)
Lindsey George , Thank you for taking time to come for your Medicare Wellness Visit. I appreciate your ongoing commitment to your health goals. Please review the following plan we discussed and let me know if I can assist you in the future.   These are the goals we discussed:  Goals       DIET - INCREASE WATER INTAKE      Recommend to drink at least 6-8 8oz glasses of water per day.      Exercise 3x per week (30 min per time) (pt-stated)      "Be in good health"      Increase physical activity      Pt would like to get back to walking more and exercising overall, unable to go to the gym right now due to covid-19        This is a list of the screening recommended for you and due dates:  Health Maintenance  Topic Date Due   Complete foot exam   07/28/2023*   COVID-19 Vaccine (7 - 2023-24 season) 07/28/2023*   Flu Shot  02/28/2024*   Yearly kidney function blood test for diabetes  11/25/2023   Eye exam for diabetics  12/05/2023   Hemoglobin A1C  01/12/2024   Yearly kidney health urinalysis for diabetes  03/11/2024   Mammogram  06/28/2024   Medicare Annual Wellness Visit  07/20/2024   DTaP/Tdap/Td vaccine (3 - Td or Tdap) 08/19/2026   Colon Cancer Screening  04/19/2028   Pneumonia Vaccine  Completed   DEXA scan (bone density measurement)  Completed   Hepatitis C Screening  Completed   Zoster (Shingles) Vaccine  Completed   HPV Vaccine  Aged Out  *Topic was postponed. The date shown is not the original due date.   Health Maintenance, Female Adopting a healthy lifestyle and getting preventive care are important in promoting health and wellness. Ask your health care provider about: The right schedule for you to have regular tests and exams. Things you can do on your own to prevent diseases and keep yourself healthy. What should I know about diet, weight, and exercise? Eat a healthy diet  Eat a diet that includes plenty of vegetables, fruits, low-fat dairy products, and lean  protein. Do not eat a lot of foods that are high in solid fats, added sugars, or sodium. Maintain a healthy weight Body mass index (BMI) is used to identify weight problems. It estimates body fat based on height and weight. Your health care provider can help determine your BMI and help you achieve or maintain a healthy weight. Get regular exercise Get regular exercise. This is one of the most important things you can do for your health. Most adults should: Exercise for at least 150 minutes each week. The exercise should increase your heart rate and make you sweat (moderate-intensity exercise). Do strengthening exercises at least twice a week. This is in addition to the moderate-intensity exercise. Spend less time sitting. Even light physical activity can be beneficial. Watch cholesterol and blood lipids Have your blood tested for lipids and cholesterol at 73 years of age, then have this test every 5 years. Have your cholesterol levels checked more often if: Your lipid or cholesterol levels are high. You are older than 73 years of age. You are at high risk for heart disease. What should I know about cancer screening? Depending on your health history and family history, you may need to have cancer screening at various ages. This may include screening for:  Breast cancer. Cervical cancer. Colorectal cancer. Skin cancer. Lung cancer. What should I know about heart disease, diabetes, and high blood pressure? Blood pressure and heart disease High blood pressure causes heart disease and increases the risk of stroke. This is more likely to develop in people who have high blood pressure readings or are overweight. Have your blood pressure checked: Every 3-5 years if you are 40-15 years of age. Every year if you are 77 years old or older. Diabetes Have regular diabetes screenings. This checks your fasting blood sugar level. Have the screening done: Once every three years after age 38 if you are at  a normal weight and have a low risk for diabetes. More often and at a younger age if you are overweight or have a high risk for diabetes. What should I know about preventing infection? Hepatitis B If you have a higher risk for hepatitis B, you should be screened for this virus. Talk with your health care provider to find out if you are at risk for hepatitis B infection. Hepatitis C Testing is recommended for: Everyone born from [birth year] through 1966. Anyone with known risk factors for hepatitis C. Sexually transmitted infections (STIs) Get screened for STIs, including gonorrhea and chlamydia, if: You are sexually active and are younger than 73 years of age. You are older than 73 years of age and your health care provider tells you that you are at risk for this type of infection. Your sexual activity has changed since you were last screened, and you are at increased risk for chlamydia or gonorrhea. Ask your health care provider if you are at risk. Ask your health care provider about whether you are at high risk for HIV. Your health care provider may recommend a prescription medicine to help prevent HIV infection. If you choose to take medicine to prevent HIV, you should first get tested for HIV. You should then be tested every 3 months for as long as you are taking the medicine. Pregnancy If you are about to stop having your period (premenopausal) and you may become pregnant, seek counseling before you get pregnant. Take 400 to 800 micrograms (mcg) of folic acid every day if you become pregnant. Ask for birth control (contraception) if you want to prevent pregnancy. Osteoporosis and menopause Osteoporosis is a disease in which the bones lose minerals and strength with aging. This can result in bone fractures. If you are 87 years old or older, or if you are at risk for osteoporosis and fractures, ask your health care provider if you should: Be screened for bone loss. Take a calcium or vitamin D  supplement to lower your risk of fractures. Be given hormone replacement therapy (HRT) to treat symptoms of menopause. Follow these instructions at home: Alcohol use Do not drink alcohol if: Your health care provider tells you not to drink. You are pregnant, may be pregnant, or are planning to become pregnant. If you drink alcohol: Limit how much you have to: 0-1 drink a day. Know how much alcohol is in your drink. In the [place] one drink equals one 12 oz bottle of beer (355 mL), one 5 oz glass of wine (148 mL), or one 1 oz glass of hard liquor (44 mL). Lifestyle Do not use any products that contain nicotine or tobacco. These products include cigarettes, chewing tobacco, and vaping devices, such as e-cigarettes. If you need help quitting, ask your health care provider. Do not use street drugs. Do not share needles. Ask your health  care provider for help if you need support or information about quitting drugs. General instructions Schedule regular health, dental, and eye exams. Stay current with your vaccines. Tell your health care provider if: You often feel depressed. You have ever been abused or do not feel safe at home. Summary Adopting a healthy lifestyle and getting preventive care are important in promoting health and wellness. Follow your health care provider's instructions about healthy diet, exercising, and getting tested or screened for diseases. Follow your health care provider's instructions on monitoring your cholesterol and blood pressure. This information is not intended to replace advice given to you by your health care provider. Make sure you discuss any questions you have with your health care provider. Document Revised: 04/07/2021 Document Reviewed: 04/07/2021 Elsevier Patient Education  2024 [employer].

## 2023-07-21 NOTE — Progress Notes (Signed)
Ordered bone density

## 2023-07-21 NOTE — Telephone Encounter (Signed)
Sched bone density for [place] 08/18/23 @ 11:20 - pt aware

## 2023-07-22 NOTE — Telephone Encounter (Signed)
Neurontin refill denied from pharmacy as patient states she is not taking it.

## 2023-07-28 ENCOUNTER — Telehealth: Payer: Self-pay | Admitting: Family Medicine

## 2023-07-28 NOTE — Telephone Encounter (Signed)
Patient called and wanted Delice Bison to call her back as she needs to know if she should call Dr Venetia Night for the spasms she is having in her back or just come in to see Dr Yetta Barre

## 2023-07-29 ENCOUNTER — Telehealth: Payer: Self-pay | Admitting: Family Medicine

## 2023-07-29 NOTE — Telephone Encounter (Signed)
Copied from CRM [id number]. Topic: General - Other >> Jul 28, 2023  4:37 PM Jannifer Rodney M wrote: Reason for CRM: Pt asked that Delice Bison be made aware that she has an appt with Dr. Volanda Napoleon on 08/05/23.

## 2023-08-01 ENCOUNTER — Ambulatory Visit
Admission: EM | Admit: 2023-08-01 | Discharge: 2023-08-01 | Disposition: A | Discharge status: Home / Self Care | Payer: Medicare PPO | Attending: Emergency Medicine | Admitting: Emergency Medicine

## 2023-08-01 DIAGNOSIS — M25561 Pain in right knee: Secondary | ICD-10-CM

## 2023-08-01 MED ORDER — PREDNISONE 10 MG (21) PO TBPK: ORAL_TABLET | Freq: Every day | ORAL | 0 refills | Status: DC

## 2023-08-01 MED ORDER — BACLOFEN 5 MG PO TABS: 5.0000 mg | ORAL_TABLET | Freq: Every evening | ORAL | 0 refills | Status: DC | PRN

## 2023-08-01 NOTE — Discharge Instructions (Signed)
Today you are evaluated for your knee pain as there was no recent injury we have held off on completing imaging as the bone should be intact  Begin prednisone every morning with food as directed, this helps to reduce inflammation and should help with your pain, you may take Tylenol 500 mg every 6 hours in addition to this as needed throughout the day, this medicine will temporarily increase your blood sugar, will return to your normal levels after you stop taking it  You may use baclofen every night at bedtime for additional comfort, may use as needed, this will make you feel sleepy  You may continue use of ice or heat over the affected area 10 to 15-minute intervals  You may massage and stretch the area as tolerated  Place pillows underneath the knee whenever sitting and lying for comfort and support  You may purchase a knee compression sleeve over-the-counter which will add stability whenever completing activity  Continue use of daily gabapentin as prescribed by your doctor  Please keep upcoming appointment with your primary doctor for further evaluation and management

## 2023-08-01 NOTE — ED Triage Notes (Signed)
Pt c/o right knee pain x1week  Pt states that she is on gabapentin due to nerve pain and came off gabapentin in June when she was released by her doctor - Dr. Marcell Barlow.   Pt has an appointment with him on Thursday. Pt states that she is back on gabapentin but it has not kicked in yet.

## 2023-08-01 NOTE — ED Provider Notes (Signed)
[facility]    CSN: [id number] Arrival date & time: 08/01/23  0907      History   Chief Complaint Chief Complaint  Patient presents with   Knee Pain    HPI Lindsey George is a 72 y.o. female.   Patient presents for evaluation of anterior right knee pain present for 7 days.  Symptoms started abruptly without participating event, injury or trauma.  Has been constant, does not radiate, rating a 10 out of 10.  Able to bear weight and complete all range of motion but pain can be felt with movement, exacerbated by long periods of walking and standing.  Has recently restarted gabapentin within the last week but endorses medicine has not taken effect.  Has attempted use and ice and heat over the affected area which has been ineffective.     Past Medical History:  Diagnosis Date   Asthma    Cancer (HCC)    skin ca   Chronic back pain    Diabetes mellitus without complication (HCC)    Hyperlipidemia    Hypertension    Obesity    Osteoporosis     Patient Active Problem List   Diagnosis Date Noted   Acute cough 11/04/2022   Reactive airway disease, mild intermittent, uncomplicated 07/16/2022   Change in bowel habits 12/10/2021   Pes anserinus bursitis of left knee 10/08/2021   DDD (degenerative disc disease), cervical 10/08/2021   Impingement syndrome of left shoulder 08/18/2021   Hand pain, left 06/26/2021   Loss of memory 06/26/2021   Tremor 06/26/2021   Lumbar stenosis with neurogenic claudication 08/27/2020   Chronic bilateral low back pain with bilateral sciatica 08/27/2020   Essential hypertension 03/06/2020   Cervical radiculopathy 03/06/2020   Asymptomatic varicose veins of both lower extremities 03/06/2020   Morbid obesity (HCC) 03/29/2019   Allergic rhinitis 01/19/2018   Hx of adenomatous colonic polyps 01/14/2018   Reactive airway disease without complication 01/12/2017   Back ache 05/22/2015   Alimentary obesity 05/22/2015   Type 2 diabetes  mellitus without complication, without long-term current use of insulin (HCC) 04/12/2014   Hypercholesterolemia 04/12/2014   OP (osteoporosis) 04/12/2014    Past Surgical History:  Procedure Laterality Date   ABDOMINAL HYSTERECTOMY     BREAST BIOPSY Right 06/22/11   bx/clip-neg   BREAST CYST ASPIRATION Left    neg   COLONOSCOPY  2014   cleared for 5 yrs- [facility] doc   COLONOSCOPY WITH PROPOFOL N/A 03/25/2018   Procedure: COLONOSCOPY WITH PROPOFOL;  Surgeon: Scot Jun, MD;  Location: [facility] ENDOSCOPY;  Service: Endoscopy;  Laterality: N/A;   COLONOSCOPY WITH PROPOFOL N/A 04/20/2023   Procedure: COLONOSCOPY WITH PROPOFOL;  Surgeon: Regis Bill, MD;  Location: [facility] ENDOSCOPY;  Service: Endoscopy;  Laterality: N/A;  NEEDS AM; HAS A GRADUATION AT 6:30 PM   PARTIAL HYSTERECTOMY     SPLENECTOMY      OB History   No obstetric history on file.      Home Medications    Prior to Admission medications   Medication Sig Start Date End Date Taking? Authorizing Provider  albuterol (VENTOLIN HFA) 108 (90 Base) MCG/ACT inhaler Inhale 2 puffs into the lungs every 6 (six) hours as needed for wheezing or shortness of breath. 03/12/23  Yes Duanne Limerick, MD  alendronate (FOSAMAX) 70 MG tablet TAKE 1 TABLET BY MOUTH ONCE A WEEK WITH A FULL GLASS OF WATER ON AN EMPTY STOMACH 03/12/23  Yes Duanne Limerick, MD  aspirin 81 MG tablet Take 81 mg by mouth daily.   Yes [provider]  calcium-vitamin D (OSCAL WITH D) 500-200 MG-UNIT tablet Take 1 tablet by mouth daily with breakfast.   Yes [provider]  carbidopa-levodopa (SINEMET IR) 25-100 MG tablet Take 1 tablet by mouth in the morning and at bedtime. 05/23/22  Yes [provider]  Cholecalciferol (VITAMIN D3 PO) Take 1 capsule by mouth daily. 11/14/19  Yes [provider]  DULoxetine (CYMBALTA) 30 MG capsule Take 2 capsules daily= 60mg  a day 03/12/23  Yes Duanne Limerick, MD  EPIPEN 2-PAK 0.3 MG/0.3ML SOAJ  injection Inject 0.3 mg into the muscle as needed. 02/03/16  Yes [provider]  gabapentin (NEURONTIN) 300 MG capsule TAKE 1 CAPSULE BY MOUTH THREE TIMES DAILY 05/24/23  Yes Drake Leach, PA-C  glipiZIDE (GLIPIZIDE XL) 5 MG 24 hr tablet Take 1 tablet (5 mg total) by mouth daily. 03/12/23  Yes Duanne Limerick, MD  ibuprofen (MOTRIN IB) 200 MG tablet Take 3 tablets (600 mg total) by mouth every 8 (eight) hours as needed. 11/19/22 11/19/23 Yes Venetia Night, MD  losartan (COZAAR) 50 MG tablet Take 1 tablet by mouth once daily 07/12/23  Yes Duanne Limerick, MD  metFORMIN (GLUCOPHAGE) 500 MG tablet TAKE ONE TABLET BY MOUTH TWICE DAILY 03/12/23  Yes Duanne Limerick, MD  Multiple Vitamin (MULTIVITAMIN) capsule Take 1 capsule by mouth daily.   Yes [provider]  ONETOUCH ULTRA test strip USE 1 STRIP TO CHECK GLUCOSE ONCE DAILY 06/18/20  Yes Duanne Limerick, MD  pioglitazone (ACTOS) 15 MG tablet Take 1 tablet (15 mg total) by mouth daily. 03/12/23  Yes Duanne Limerick, MD  simvastatin (ZOCOR) 40 MG tablet Take 1 tablet (40 mg total) by mouth daily. 03/12/23  Yes Duanne Limerick, MD  doxycycline (VIBRA-TABS) 100 MG tablet Take 1 tablet (100 mg total) by mouth 2 (two) times daily. 07/20/23   Duanne Limerick, MD    Family History Family History  Problem Relation Age of Onset   Breast cancer Sister 62   Cancer Sister    Heart disease Sister    Cancer Father    Stroke Father    Heart disease Sister     Social History Social History   Tobacco Use   Smoking status: Never   Smokeless tobacco: Never   Tobacco comments:    smoking cessation materials not required  Vaping Use   Vaping status: Never Used  Substance Use Topics   Alcohol use: Not Currently   Drug use: Never     Allergies   Codeine   Review of Systems Review of Systems   Physical Exam Triage Vital Signs ED Triage Vitals  Encounter Vitals Group     BP 08/01/23 0932 (!) 142/82     Systolic BP Percentile --       Diastolic BP Percentile --      Pulse Rate 08/01/23 0932 76     Resp --      Temp 08/01/23 0932 98.5 F (36.9 C)     Temp Source 08/01/23 0932 Oral     SpO2 08/01/23 0932 96 %     Weight 08/01/23 0930 174 lb (78.9 kg)     Height 08/01/23 0930 5\' 2"  (1.575 m)     Head Circumference --      Peak Flow --      Pain Score 08/01/23 0928 5     Pain Loc --  Pain Education --      Exclude from Growth Chart --    No data found.  Updated Vital Signs BP (!) 142/82 (BP Location: Left Arm)   Pulse 76   Temp 98.5 F (36.9 C) (Oral)   Ht 5\' 2"  (1.575 m)   Wt 174 lb (78.9 kg)   SpO2 96%   BMI 31.83 kg/m   Visual Acuity Right Eye Distance:   Left Eye Distance:   Bilateral Distance:    Right Eye Near:   Left Eye Near:    Bilateral Near:     Physical Exam Constitutional:      Appearance: Normal appearance.  Pulmonary:     Effort: Pulmonary effort is normal.  Musculoskeletal:     Comments: Tenderness and mild to moderate swelling along the lower anterior right knee without ecchymosis or deformity, no effusion noted, able to bear weight and complete range of motion, 2+ popliteal pulse  Neurological:     Mental Status: She is alert and oriented to person, place, and time. Mental status is at baseline.      UC Treatments / Results  Labs (all labs ordered are listed, but only abnormal results are displayed) Labs Reviewed - No data to display  EKG   Radiology No results found.  Procedures Procedures (including critical care time)  Medications Ordered in UC Medications - No data to display  Initial Impression / Assessment and Plan / UC Course  I have reviewed the triage vital signs and the nursing notes.  Pertinent labs & imaging results that were available during my care of the patient were reviewed by me and considered in my medical decision making (see chart for details).  Acute right knee pain  Etiology most likely inflammatory, no new injury therefore  imaging deferred low suspicion for bone involvement, discussed this with patient, prescribed prednisone taper and muscle relaxant, recommended continued use of prescribed medicine as directed, may use RICE, heat massage stretching and activity as tolerated for supportive care, has upcoming appointment with PCP in 4 days, may be reevaluated at this time Final Clinical Impressions(s) / UC Diagnoses   Final diagnoses:  None   Discharge Instructions   None    ED Prescriptions   None    PDMP not reviewed this encounter.   Valinda Hoar, NP 08/01/23 1306

## 2023-08-05 ENCOUNTER — Encounter: Payer: Self-pay | Admitting: Neurosurgery

## 2023-08-05 ENCOUNTER — Ambulatory Visit: Payer: Medicare PPO | Admitting: Neurosurgery

## 2023-08-05 VITALS — BP 122/72 | Ht 62.0 in | Wt 174.0 lb

## 2023-08-05 DIAGNOSIS — M4316 Spondylolisthesis, lumbar region: Secondary | ICD-10-CM | POA: Diagnosis not present

## 2023-08-05 DIAGNOSIS — G8929 Other chronic pain: Secondary | ICD-10-CM | POA: Diagnosis not present

## 2023-08-05 DIAGNOSIS — M5030 Other cervical disc degeneration, unspecified cervical region: Secondary | ICD-10-CM

## 2023-08-05 DIAGNOSIS — M25561 Pain in right knee: Secondary | ICD-10-CM

## 2023-08-05 DIAGNOSIS — M5416 Radiculopathy, lumbar region: Secondary | ICD-10-CM | POA: Diagnosis not present

## 2023-08-05 DIAGNOSIS — M5412 Radiculopathy, cervical region: Secondary | ICD-10-CM

## 2023-08-05 DIAGNOSIS — M48062 Spinal stenosis, lumbar region with neurogenic claudication: Secondary | ICD-10-CM | POA: Diagnosis not present

## 2023-08-05 MED ORDER — GABAPENTIN 300 MG PO CAPS: 300.0000 mg | ORAL_CAPSULE | Freq: Three times a day (TID) | ORAL | 0 refills | Status: DC

## 2023-08-05 NOTE — Progress Notes (Signed)
Referring Physician:  No referring provider defined for this encounter.  Primary Physician:  Duanne Limerick, MD  History of Present Illness: 08/05/2023 Lindsey George was doing very well until recently when she began having pain around her right knee.  She had gone off of gabapentin for 2 months but then restarted to 2 pills every day.  She is having lots of pain with ambulation.  05/11/2023 Lindsey George is doing much better.  She has minimal pain.  02/25/2023 She is doing approximately 50% better.  She continues to do physical therapy.  01/14/2023 She is doing somewhat better after her injection last month.  She still having pain down her right leg as described below.   11/19/2022 Lindsey George is here today with a chief complaint of  right sided low back pain that radiates into the right leg.  She has previously had a bout with this in the past.  She underwent injections in 2021 with some improvement in her symptoms.  Unfortunately, she did not like the idea of having a needle placed in her spine.  She presents this time with 2 to 3 weeks of worsening pain down her right leg particular when she stands, walks, or is otherwise active.  She has been on steroids which have helped somewhat. Bowel/Bladder Dysfunction: none  Conservative measures:  Physical therapy:  has not participated in? Multimodal medical therapy including regular antiinflammatories:  gabapentin, medrol dosepak, oxycodone Injections:  has not received any epidural steroid injections recently  Past Surgery: denies  Lindsey George has no symptoms of cervical myelopathy.  The symptoms are causing a significant impact on the patient's life.   I have utilized the care everywhere function in epic to review the outside records available from external health systems.  Review of Systems:  A 10 point review of systems is negative, except for the pertinent positives and negatives detailed in the  HPI.  Past Medical History: Past Medical History:  Diagnosis Date   Asthma    Cancer (HCC)    skin ca   Chronic back pain    Diabetes mellitus without complication (HCC)    Hyperlipidemia    Hypertension    Obesity    Osteoporosis     Past Surgical History: Past Surgical History:  Procedure Laterality Date   ABDOMINAL HYSTERECTOMY     BREAST BIOPSY Right 06/22/11   bx/clip-neg   BREAST CYST ASPIRATION Left    neg   COLONOSCOPY  2014   cleared for 5 yrs- [facility] doc   COLONOSCOPY WITH PROPOFOL N/A 03/25/2018   Procedure: COLONOSCOPY WITH PROPOFOL;  Surgeon: Scot Jun, MD;  Location: [facility] ENDOSCOPY;  Service: Endoscopy;  Laterality: N/A;   COLONOSCOPY WITH PROPOFOL N/A 04/20/2023   Procedure: COLONOSCOPY WITH PROPOFOL;  Surgeon: Regis Bill, MD;  Location: [facility] ENDOSCOPY;  Service: Endoscopy;  Laterality: N/A;  NEEDS AM; HAS A GRADUATION AT 6:30 PM   PARTIAL HYSTERECTOMY     SPLENECTOMY      Allergies: Allergies as of 08/05/2023 - Review Complete 08/05/2023  Allergen Reaction Noted   Codeine Other (See Comments) 08/30/2015    Medications: Current Meds  Medication Sig   albuterol (VENTOLIN HFA) 108 (90 Base) MCG/ACT inhaler Inhale 2 puffs into the lungs every 6 (six) hours as needed for wheezing or shortness of breath.   alendronate (FOSAMAX) 70 MG tablet TAKE 1 TABLET BY MOUTH ONCE A WEEK WITH A FULL GLASS OF WATER ON AN EMPTY STOMACH  aspirin 81 MG tablet Take 81 mg by mouth daily.   Baclofen 5 MG TABS Take 1 tablet (5 mg total) by mouth at bedtime as needed.   calcium-vitamin D (OSCAL WITH D) 500-200 MG-UNIT tablet Take 1 tablet by mouth daily with breakfast.   carbidopa-levodopa (SINEMET IR) 25-100 MG tablet Take 1 tablet by mouth in the morning and at bedtime.   Cholecalciferol (VITAMIN D3 PO) Take 1 capsule by mouth daily.   doxycycline (VIBRA-TABS) 100 MG tablet Take 1 tablet (100 mg total) by mouth 2 (two) times daily.   DULoxetine (CYMBALTA) 30 MG  capsule Take 2 capsules daily= 60mg  a day   EPIPEN 2-PAK 0.3 MG/0.3ML SOAJ injection Inject 0.3 mg into the muscle as needed.   gabapentin (NEURONTIN) 300 MG capsule TAKE 1 CAPSULE BY MOUTH THREE TIMES DAILY   glipiZIDE (GLIPIZIDE XL) 5 MG 24 hr tablet Take 1 tablet (5 mg total) by mouth daily.   ibuprofen (MOTRIN IB) 200 MG tablet Take 3 tablets (600 mg total) by mouth every 8 (eight) hours as needed.   losartan (COZAAR) 50 MG tablet Take 1 tablet by mouth once daily   metFORMIN (GLUCOPHAGE) 500 MG tablet TAKE ONE TABLET BY MOUTH TWICE DAILY   Multiple Vitamin (MULTIVITAMIN) capsule Take 1 capsule by mouth daily.   ONETOUCH ULTRA test strip USE 1 STRIP TO CHECK GLUCOSE ONCE DAILY   pioglitazone (ACTOS) 15 MG tablet Take 1 tablet (15 mg total) by mouth daily.   predniSONE (STERAPRED UNI-PAK 21 TAB) 10 MG (21) TBPK tablet Take by mouth daily. Take 6 tabs by mouth daily  for 1 days, then 5 tabs for 1 days, then 4 tabs for 1 days, then 3 tabs for 1 days, 2 tabs for 1 days, then 1 tab by mouth daily for 1 days   simvastatin (ZOCOR) 40 MG tablet Take 1 tablet (40 mg total) by mouth daily.   Current Facility-Administered Medications for the 08/05/23 encounter (Office Visit) with Venetia Night, MD  Medication   ipratropium-albuterol (DUONEB) 0.5-2.5 (3) MG/3ML nebulizer solution 3 mL    Social History: Social History   Tobacco Use   Smoking status: Never   Smokeless tobacco: Never   Tobacco comments:    smoking cessation materials not required  Vaping Use   Vaping status: Never Used  Substance Use Topics   Alcohol use: Not Currently   Drug use: Never    Family Medical History: Family History  Problem Relation Age of Onset   Breast cancer Sister 26   Cancer Sister    Heart disease Sister    Cancer Father    Stroke Father    Heart disease Sister     Physical Examination: Vitals:   08/05/23 1441  BP: 122/72      General: Patient is well developed, well nourished, calm,  collected, and in no apparent distress. Attention to examination is appropriate.  Neck:   Supple.  Full range of motion.  Respiratory: Patient is breathing without any difficulty.   NEUROLOGICAL:     Awake, alert, oriented to person, place, and time.  Speech is clear and fluent. Fund of knowledge is appropriate.   Cranial Nerves: Pupils equal round and reactive to light.  Facial tone is symmetric.  Facial sensation is symmetric. Shoulder shrug is symmetric. Tongue protrusion is midline.  There is no pronator drift.  ROM of spine: full.    Strength: Side Biceps Triceps Deltoid Interossei Grip Wrist Ext. Wrist Flex.  R 5 5 5  5 5 5 5   L 5 5 5 5 5 5 5    Side Iliopsoas Quads Hamstring PF DF EHL  R 5 5 5 5 5 5   L 5 5 5 5 5 5    Reflexes are 1+ and symmetric at the biceps, triceps, brachioradialis, patella and achilles.   Hoffman's is present.   Bilateral upper and lower extremity sensation is intact to light touch.    No evidence of dysmetria noted.  +TTP around R knee joint   Medical Decision Making  Imaging: CT L spine 11/10/2022 IMPRESSION: 1. Interval increase in spinal canal and right lateral recess narrowing at L4-L5 secondary to interval increase in size of a circumferential disc bulge as well as increased ligamentum flavum hypertrophy. This could account for patient's right-sided symptoms. If more definitive characterization of the degree of degenerative change is clinically warranted, further evaluation with a non contrast enhanced lumbar spine MRI is recommended. 2. No evidence of acute fracture.   Aortic Atherosclerosis (ICD10-I70.0).     Electronically Signed   By: Lorenza Cambridge M.D.   On: 11/10/2022 12:59  MRI L spine 12/04/2022 IMPRESSION: 1. Diffuse lumbar spine spondylosis without significant interval change compared with the prior exam. 2. At L3-4 there is a broad-based disc bulge. Moderate bilateral facet arthropathy with bilateral facet effusions  and ligamentum flavum infolding. Severe spinal stenosis. Severe right foraminal stenosis. Mild left foraminal stenosis. 3. At L4-5 there is a broad-based disc bulge with a broad shallow right paracentral disc protrusion contacting the right intraspinal L5 nerve root. Moderate bilateral facet arthropathy with a small left facet effusion. Bilateral lateral recess stenosis. Mild spinal stenosis. Moderate left foraminal stenosis. Mild-moderate right foraminal stenosis. 4. At L5-S1 there is a mild broad-based disc osteophyte complex. Mild bilateral facet arthropathy. No right foraminal stenosis. Moderate left foraminal stenosis. 5. At L1-2 there is a broad-based disc bulge with a tiny left paracentral disc protrusion. Left lateral recess stenosis. Mild bilateral facet arthropathy. 6. At L2-3 there is a broad-based disc bulge. Mild bilateral facet arthropathy. Mild-moderate spinal stenosis. 7. No acute osseous injury of the lumbar spine.     Electronically Signed   By: Elige Ko M.D.   On: 12/06/2022 08:03  I have personally reviewed the images and agree with the above interpretation.  Assessment and Plan: Lindsey George is a pleasant 72 y.o. female with symptoms of lumbar radiculopathy and neurogenic claudication.  However, her symptoms today are mostly secondary to right knee pain.  She has tenderness to palpation around her right knee joint.  I would like to get her back in for reevaluation with her orthopedist.  I am happy to reevaluate her back at any point.  I will reorder her gabapentin to 3 times daily.    I spent a total of 10 minutes in this patient's care today. This time was spent reviewing pertinent records including imaging studies, obtaining and confirming history, performing a directed evaluation, formulating and discussing my recommendations, and documenting the visit within the medical record.    Thank you for involving me in the care of this patient.       Maresha Anastos K. Myer Haff MD, [facility] Neurosurgery

## 2023-08-06 NOTE — Addendum Note (Signed)
Addended by: Ernie Hew on: 08/06/2023 11:04 AM   Modules accepted: Orders

## 2023-08-10 ENCOUNTER — Other Ambulatory Visit: Payer: Self-pay

## 2023-08-10 ENCOUNTER — Emergency Department
Admission: EM | Admit: 2023-08-10 | Discharge: 2023-08-10 | Disposition: A | Discharge status: Home / Self Care | Payer: Medicare PPO | Attending: Emergency Medicine | Admitting: Emergency Medicine

## 2023-08-10 ENCOUNTER — Emergency Department: Payer: Medicare PPO

## 2023-08-10 ENCOUNTER — Encounter: Payer: Self-pay | Admitting: Emergency Medicine

## 2023-08-10 DIAGNOSIS — M25561 Pain in right knee: Secondary | ICD-10-CM | POA: Diagnosis not present

## 2023-08-10 DIAGNOSIS — G20A1 Parkinson's disease without dyskinesia, without mention of fluctuations: Secondary | ICD-10-CM | POA: Diagnosis not present

## 2023-08-10 DIAGNOSIS — E119 Type 2 diabetes mellitus without complications: Secondary | ICD-10-CM | POA: Insufficient documentation

## 2023-08-10 DIAGNOSIS — J45909 Unspecified asthma, uncomplicated: Secondary | ICD-10-CM | POA: Insufficient documentation

## 2023-08-10 DIAGNOSIS — I10 Essential (primary) hypertension: Secondary | ICD-10-CM | POA: Insufficient documentation

## 2023-08-10 MED ORDER — IBUPROFEN 400 MG PO TABS: 400.0000 mg | ORAL_TABLET | Freq: Three times a day (TID) | ORAL | 0 refills | Status: AC | PRN

## 2023-08-10 MED ORDER — HYDROCODONE-ACETAMINOPHEN 5-325 MG PO TABS
1.0000 | ORAL_TABLET | Freq: Once | ORAL | Status: AC
  Administered 2023-08-10: 1 via ORAL
  Filled 2023-08-10: qty 1

## 2023-08-10 NOTE — ED Triage Notes (Signed)
Pt sts that she has been having right knee pain for the last several weeks. Dr. Marcell Barlow took pt off her gabapentin and since than she has been having extreme pain.

## 2023-08-10 NOTE — ED Notes (Signed)
See triage note  Presents with right knee/leg pain  States she was on  gabapentin   but was taken off  and recently placed back on  States she she started back 2 weeks ago   But states pain is not any better  Is scheduled to see Ortho tomorrow  Denies nay recent injury

## 2023-08-10 NOTE — ED Provider Notes (Signed)
[facility] Emergency Department Provider Note     Event Date/Time   First MD Initiated Contact with Patient 08/10/23 1333     (approximate)   History   Leg Pain   HPI  Lindsey George is a 72 y.o. female with a history of Parkinson's, diabetes, asthma, HLD, osteoporosis and hypertension presents today to the ED with worsening right knee pain over the past 3 weeks. Constant and worse at night. No radiation. Patient reports a history of chronic right knee pain for which she has been on gabapentin, but it was discontinued recently due to improvement in her pain following physical therapy.  Since stopping gabapentin her pain has significantly worsened. The patient was previously under the care of neurosurgeon Dr. Myer Haff who recommended surgery but the patient declined this option.  She denies injury or trauma.  Patient has recently refilled her gabapentin and by her neurologist who treats her Parkinson's in which she has been taking for a week.    Physical Exam   Triage Vital Signs: ED Triage Vitals [08/10/23 1217]  Encounter Vitals Group     BP (!) 155/85     Systolic BP Percentile      Diastolic BP Percentile      Pulse Rate 79     Resp 18     Temp 98 F (36.7 C)     Temp Source Oral     SpO2 92 %     Weight 171 lb 15.3 oz (78 kg)     Height 5\' 2"  (1.575 m)     Head Circumference      Peak Flow      Pain Score 10     Pain Loc      Pain Education      Exclude from Growth Chart     Most recent vital signs: Vitals:   08/10/23 1217  BP: (!) 155/85  Pulse: 79  Resp: 18  Temp: 98 F (36.7 C)  SpO2: 92%    General: Well appearing. Alert and oriented. INAD.  Skin:  Warm, dry and intact. No rashes or lesions noted.     Head:  NCAT.  Eyes:  PERRLA. EOMI.  CV:  Good peripheral perfusion. RRR. No peripheral edema.  RESP:  Normal effort. LCTAB. No retractions.  ABD:  No distention.  MSK:   Full ROM in all joints. No swelling,  deformity.  Right Knee: Focal tenderness to palpation over anterior aspect of patella.  Full AROM.  Neurovascular status intact all throughout.   NEURO: No focal deficits. Sensation and motor function intact. 5/5 muscle strength of UE & LE.  ED Results / Procedures / Treatments   Labs (all labs ordered are listed, but only abnormal results are displayed) Labs Reviewed - No data to display  RADIOLOGY  I personally viewed and evaluated these images as part of my medical decision making, as well as reviewing the written report by the radiologist.  ED Provider Interpretation: Right knee is unremarkable.  DG Knee Complete 4 Views Right  Result Date: 08/10/2023 CLINICAL DATA:  Knee pain radiating into the right lower extremity. No known injury. EXAM: RIGHT KNEE - COMPLETE 4 VIEW COMPARISON:  Right knee radiographs dated 03/19/2017 FINDINGS: No evidence of fracture, dislocation, or joint effusion. No evidence of arthropathy or other focal bone abnormality. Soft tissues are unremarkable. IMPRESSION: No focal radiographic abnormality. Electronically Signed   By: Agustin Cree M.D.   On: 08/10/2023 16:05  PROCEDURES:  Critical Care performed: No  Procedures  MEDICATIONS ORDERED IN ED: Medications  HYDROcodone-acetaminophen (NORCO/VICODIN) 5-325 MG per tablet 1 tablet (1 tablet Oral Given 08/10/23 1430)   IMPRESSION / MDM / ASSESSMENT AND PLAN / ED COURSE  I reviewed the triage vital signs and the nursing notes.                               72 y.o. female presents to the emergency department for evaluation and treatment of acute right knee pain without injury. See HPI for further details.   Differential diagnosis includes, but is not limited to fracture, dislocation, septic joint, joint effusion, arthritis.  Patient's presentation is most consistent with acute complicated illness / injury requiring diagnostic workup.  The patient presents with exacerbation of chronic right knee pain after  discontinuation of gabapentin.  X-ray of the right knee is reassuring.  There is no swelling or redness to indicate a septic joint or joint effusion on physical exam.  Patient given Norco in ED for pain for short-term pain control.  The patient has a follow-up appointment with orthopedics scheduled for tomorrow to evaluate her pain management options and her knee pain which is reassuring.  I encouraged her to continue this appointment.  She is encouraged to discuss options with her neurologist of her recent refill of her gabapentin and explore other options.   The patient will be discharged in stable condition with instructions for RICE therapy at home. Patient is given ED precautions to return to the ED for any worsening or new symptoms. Patient verbalizes understanding. All questions and concerns were addressed during ED visit.     FINAL CLINICAL IMPRESSION(S) / ED DIAGNOSES   Final diagnoses:  Right knee pain, unspecified chronicity   Rx / DC Orders   ED Discharge Orders          Ordered    ibuprofen (ADVIL) 400 MG tablet  Every 8 hours PRN        08/10/23 1706             Note:  This document was prepared using Dragon voice recognition software and may include unintentional dictation errors.    Romeo Apple, Tamanna Whitson A, PA-C 08/11/23 [id number]    Corena Herter, MD 08/13/23 [phone]

## 2023-08-10 NOTE — Discharge Instructions (Addendum)
You were seen in the ED for right knee pain.  Your x-ray is normal.  Follow-up with orthopedics with your scheduled appointment tomorrow.  Take ibuprofen and Tylenol for pain.  Elevate knee at night.  Apply ice as needed 20 minutes on 20 minutes off.  Pick Up lidocaine or IcyHot patches from over-the-counter at your local pharmacy.

## 2023-08-11 DIAGNOSIS — M1711 Unilateral primary osteoarthritis, right knee: Secondary | ICD-10-CM | POA: Diagnosis not present

## 2023-08-11 DIAGNOSIS — Z6832 Body mass index (BMI) 32.0-32.9, adult: Secondary | ICD-10-CM | POA: Diagnosis not present

## 2023-08-11 DIAGNOSIS — E119 Type 2 diabetes mellitus without complications: Secondary | ICD-10-CM | POA: Diagnosis not present

## 2023-08-11 DIAGNOSIS — G8929 Other chronic pain: Secondary | ICD-10-CM | POA: Diagnosis not present

## 2023-08-11 DIAGNOSIS — M48062 Spinal stenosis, lumbar region with neurogenic claudication: Secondary | ICD-10-CM | POA: Diagnosis not present

## 2023-08-16 ENCOUNTER — Telehealth: Payer: Self-pay | Admitting: Family Medicine

## 2023-08-16 ENCOUNTER — Telehealth: Payer: Self-pay

## 2023-08-16 ENCOUNTER — Telehealth: Payer: Self-pay | Admitting: Neurosurgery

## 2023-08-16 NOTE — Telephone Encounter (Signed)
Patient called stating she was getting injections completed by Dr.Mundy. Patient is still in pain and the provider will be out for 2 weeks on vacation. Patient wondering what she can do in the mean-time for the pain in her knee says its really bad. Please advise

## 2023-08-16 NOTE — Telephone Encounter (Signed)
I spoke with Lindsey George. The orthopedics office note on 08/11/23 states they wanted to see her back in 2 weeks to consider ordering an MRI of her knee. She does not have a follow up visit scheduled with them. I advised her to contact orthopedics to inform them that her knee is still bothering her. I informed her that we are happy to see her back at any time, but that she should follow up with orthopedics first.

## 2023-08-16 NOTE — Telephone Encounter (Signed)
Copied from CRM [id number]. Topic: General - Other >> Aug 16, 2023  9:31 AM Epimenio Foot F wrote: Reason for CRM: Pt is calling in requesting Delice Bison to call her. Pt says she would like Delice Bison to call her cell phone sometime today. Pt says the call is regarding getting a second opinion on her knee. Please follow up with pt.

## 2023-08-16 NOTE — Telephone Encounter (Signed)
Called pt back and left detailed message concerning second opinion on knee. It appears she had possible injection of knee on the 11th of September. The doctor wanted to follow up in 2 weeks to see how she tolerated it. And at that time, go for MRI if not better. Proceed with plan unless she wants to second opinion, in which we could put that referral in. So far, Lindsey George agrees with plan

## 2023-08-18 ENCOUNTER — Other Ambulatory Visit: Payer: Medicare PPO

## 2023-08-20 ENCOUNTER — Encounter: Payer: Self-pay | Admitting: Family Medicine

## 2023-08-20 ENCOUNTER — Ambulatory Visit: Payer: Medicare PPO | Admitting: Family Medicine

## 2023-08-20 ENCOUNTER — Ambulatory Visit: Payer: Self-pay

## 2023-08-20 VITALS — BP 110/70 | HR 94 | Temp 100.4°F | Resp 12 | Ht 62.0 in | Wt 174.0 lb

## 2023-08-20 DIAGNOSIS — U071 COVID-19: Secondary | ICD-10-CM | POA: Diagnosis not present

## 2023-08-20 DIAGNOSIS — R051 Acute cough: Secondary | ICD-10-CM

## 2023-08-20 LAB — POCT INFLUENZA A/B
Influenza A, POC: NEGATIVE
Influenza B, POC: NEGATIVE

## 2023-08-20 LAB — POC COVID19 BINAXNOW: SARS Coronavirus 2 Ag: POSITIVE — AB

## 2023-08-20 MED ORDER — MOLNUPIRAVIR EUA 200MG CAPSULE
4.0000 | ORAL_CAPSULE | Freq: Two times a day (BID) | ORAL | 0 refills | Status: AC
Start: 2023-08-20 — End: 2023-08-25

## 2023-08-20 NOTE — Telephone Encounter (Signed)
    Chief Complaint: Head congestion, sore throat, hurts to swallow, mild SOB. Symptoms: Above Frequency: Tuesday Pertinent Negatives: Patient denies fever Disposition: [] ED /[] Urgent Care (no appt availability in office) / [x] Appointment(In office/virtual)/ []  [facility]/ [] Home Care/ [] Refused Recommended Disposition /[] [facility]/ []  Follow-up with PCP Additional Notes: Pt. Agrees with appointment.  Reason for Disposition  [1] SEVERE pain AND [2] not improved 2 hours after pain medicine  Answer Assessment - Initial Assessment Questions 1. LOCATION: "Where does it hurt?"      None 2. ONSET: "When did the sinus pain start?"  (e.g., hours, days)      Tuesday 3. SEVERITY: "How bad is the pain?"   (Scale 1-10; mild, moderate or severe)   - MILD (1-3): doesn't interfere with normal activities    - MODERATE (4-7): interferes with normal activities (e.g., work or school) or awakens from sleep   - SEVERE (8-10): excruciating pain and patient unable to do any normal activities        Sore throat 4. RECURRENT SYMPTOM: "Have you ever had sinus problems before?" If Yes, ask: "When was the last time?" and "What happened that time?"      No 5. NASAL CONGESTION: "Is the nose blocked?" If Yes, ask: "Can you open it or must you breathe through your mouth?"     No 6. NASAL DISCHARGE: "Do you have discharge from your nose?" If so ask, "What color?"     Clear  7. FEVER: "Do you have a fever?" If Yes, ask: "What is it, how was it measured, and when did it start?"      No 8. OTHER SYMPTOMS: "Do you have any other symptoms?" (e.g., sore throat, cough, earache, difficulty breathing)     Sore throat 9. PREGNANCY: "Is there any chance you are pregnant?" "When was your last menstrual period?"     No  Protocols used: Sinus Pain or Congestion-A-AH

## 2023-08-20 NOTE — Progress Notes (Signed)
Date:  08/20/2023   Name:  Lindsey George   DOB:  1951/10/19   MRN:  [id number]   Chief Complaint: Cough (Jerry/spouse is in room- okayed by pt- started 2 days ago)  HPI  Lab Results  Component Value Date   NA 140 11/24/2022   K 4.3 11/24/2022   CO2 25 11/24/2022   GLUCOSE 118 (H) 11/24/2022   BUN 14 11/24/2022   CREATININE 0.62 11/24/2022   CALCIUM 10.6 (H) 11/24/2022   EGFR 95 11/24/2022   GFRNONAA 92 11/11/2020   Lab Results  Component Value Date   CHOL 139 03/12/2023   HDL 57 03/12/2023   LDLCALC 60 03/12/2023   TRIG 122 03/12/2023   CHOLHDL 2.9 09/19/2018   No results found for: "TSH" Lab Results  Component Value Date   HGBA1C 7.0 (H) 07/12/2023   Lab Results  Component Value Date   WBC 8.3 07/20/2023   HGB 12.6 07/20/2023   HCT 39.4 07/20/2023   MCV 81.1 07/20/2023   PLT 351 07/20/2023   Lab Results  Component Value Date   ALT 15 11/24/2022   AST 12 11/24/2022   ALKPHOS 47 11/24/2022   BILITOT 0.4 11/24/2022   No results found for: "25OHVITD2", "25OHVITD3", "VD25OH"   Review of Systems  Constitutional:  Positive for chills, diaphoresis, fatigue and fever.  HENT:  Positive for congestion and sore throat.   Respiratory:  Negative for chest tightness, shortness of breath and wheezing.   Cardiovascular:  Negative for chest pain, palpitations and leg swelling.  Gastrointestinal:  Negative for abdominal pain.    Patient Active Problem List   Diagnosis Date Noted   Acute cough 11/04/2022   Reactive airway disease, mild intermittent, uncomplicated 07/16/2022   Change in bowel habits 12/10/2021   Pes anserinus bursitis of left knee 10/08/2021   DDD (degenerative disc disease), cervical 10/08/2021   Impingement syndrome of left shoulder 08/18/2021   Hand pain, left 06/26/2021   Loss of memory 06/26/2021   Tremor 06/26/2021   Lumbar stenosis with neurogenic claudication 08/27/2020   Chronic bilateral low back pain with bilateral sciatica  08/27/2020   Essential hypertension 03/06/2020   Cervical radiculopathy 03/06/2020   Asymptomatic varicose veins of both lower extremities 03/06/2020   Morbid obesity (HCC) 03/29/2019   Allergic rhinitis 01/19/2018   Hx of adenomatous colonic polyps 01/14/2018   Reactive airway disease without complication 01/12/2017   Back ache 05/22/2015   Alimentary obesity 05/22/2015   Type 2 diabetes mellitus without complication, without long-term current use of insulin (HCC) 04/12/2014   Hypercholesterolemia 04/12/2014   OP (osteoporosis) 04/12/2014    Allergies  Allergen Reactions   Codeine Other (See Comments)    Abdominal pain    Past Surgical History:  Procedure Laterality Date   ABDOMINAL HYSTERECTOMY     BREAST BIOPSY Right 06/22/11   bx/clip-neg   BREAST CYST ASPIRATION Left    neg   COLONOSCOPY  2014   cleared for 5 yrs- [facility] doc   COLONOSCOPY WITH PROPOFOL N/A 03/25/2018   Procedure: COLONOSCOPY WITH PROPOFOL;  Surgeon: Scot Jun, MD;  Location: [facility] ENDOSCOPY;  Service: Endoscopy;  Laterality: N/A;   COLONOSCOPY WITH PROPOFOL N/A 04/20/2023   Procedure: COLONOSCOPY WITH PROPOFOL;  Surgeon: Regis Bill, MD;  Location: [facility] ENDOSCOPY;  Service: Endoscopy;  Laterality: N/A;  NEEDS AM; HAS A GRADUATION AT 6:30 PM   PARTIAL HYSTERECTOMY     SPLENECTOMY      Social History   Tobacco  Use   Smoking status: Never   Smokeless tobacco: Never   Tobacco comments:    smoking cessation materials not required  Vaping Use   Vaping status: Never Used  Substance Use Topics   Alcohol use: Not Currently   Drug use: Never     Medication list has been reviewed and updated.  Current Meds  Medication Sig   albuterol (VENTOLIN HFA) 108 (90 Base) MCG/ACT inhaler Inhale 2 puffs into the lungs every 6 (six) hours as needed for wheezing or shortness of breath.   alendronate (FOSAMAX) 70 MG tablet TAKE 1 TABLET BY MOUTH ONCE A WEEK WITH A FULL GLASS OF WATER ON AN EMPTY STOMACH    aspirin 81 MG tablet Take 81 mg by mouth daily.   Baclofen 5 MG TABS Take 1 tablet (5 mg total) by mouth at bedtime as needed.   calcium-vitamin D (OSCAL WITH D) 500-200 MG-UNIT tablet Take 1 tablet by mouth daily with breakfast.   carbidopa-levodopa (SINEMET IR) 25-100 MG tablet Take 1 tablet by mouth in the morning and at bedtime.   Cholecalciferol (VITAMIN D3 PO) Take 1 capsule by mouth daily.   DULoxetine (CYMBALTA) 30 MG capsule Take 2 capsules daily= 60mg  a day   EPIPEN 2-PAK 0.3 MG/0.3ML SOAJ injection Inject 0.3 mg into the muscle as needed.   gabapentin (NEURONTIN) 300 MG capsule Take 1 capsule (300 mg total) by mouth 3 (three) times daily.   glipiZIDE (GLIPIZIDE XL) 5 MG 24 hr tablet Take 1 tablet (5 mg total) by mouth daily.   ibuprofen (ADVIL) 400 MG tablet Take 1 tablet (400 mg total) by mouth every 8 (eight) hours as needed for moderate pain.   losartan (COZAAR) 50 MG tablet Take 1 tablet by mouth once daily   metFORMIN (GLUCOPHAGE) 500 MG tablet TAKE ONE TABLET BY MOUTH TWICE DAILY   Multiple Vitamin (MULTIVITAMIN) capsule Take 1 capsule by mouth daily.   ONETOUCH ULTRA test strip USE 1 STRIP TO CHECK GLUCOSE ONCE DAILY   pioglitazone (ACTOS) 15 MG tablet Take 1 tablet (15 mg total) by mouth daily.   simvastatin (ZOCOR) 40 MG tablet Take 1 tablet (40 mg total) by mouth daily.   Current Facility-Administered Medications for the 08/20/23 encounter (Office Visit) with Duanne Limerick, MD  Medication   ipratropium-albuterol (DUONEB) 0.5-2.5 (3) MG/3ML nebulizer solution 3 mL       08/20/2023   11:26 AM 07/20/2023   11:14 AM 07/12/2023    9:44 AM 03/12/2023   10:49 AM  GAD 7 : Generalized Anxiety Score  Nervous, Anxious, on Edge 0 0 0 0  Control/stop worrying 0 0 0 0  Worry too much - different things 0 0 0 0  Trouble relaxing 0 0 0 0  Restless 0 0 0 0  Easily annoyed or irritable 0 0 0 0  Afraid - awful might happen 0 0 0 0  Total GAD 7 Score 0 0 0 0  Anxiety Difficulty  Not difficult at all Not difficult at all Not difficult at all Not difficult at all       08/20/2023   11:26 AM 07/21/2023    9:06 AM 07/21/2023    9:03 AM  Depression screen PHQ 2/9  Decreased Interest 0 0 0  Down, Depressed, Hopeless 0 0 0  PHQ - 2 Score 0 0 0  Altered sleeping 0 0 0  Tired, decreased energy 0 0 0  Change in appetite 0 0 0  Feeling bad or failure  about yourself  0 0 0  Trouble concentrating 0 0 0  Moving slowly or fidgety/restless 0 0 0  Suicidal thoughts 0 0 0  PHQ-9 Score 0 0 0  Difficult doing work/chores Not difficult at all Not difficult at all Not difficult at all    BP Readings from Last 3 Encounters:  08/20/23 110/70  08/10/23 (!) 155/85  08/05/23 122/72    Physical Exam Vitals and nursing note reviewed. Exam conducted with a chaperone present.  Constitutional:      General: She is not in acute distress.    Appearance: She is not diaphoretic.  HENT:     Head: Normocephalic and atraumatic.     Right Ear: Tympanic membrane and external ear normal.     Left Ear: Tympanic membrane and external ear normal.     Nose: Nose normal.     Mouth/Throat:     Mouth: Mucous membranes are moist.     Pharynx: No posterior oropharyngeal erythema.  Eyes:     General:        Right eye: No discharge.        Left eye: No discharge.     Conjunctiva/sclera: Conjunctivae normal.     Pupils: Pupils are equal, round, and reactive to light.  Neck:     Thyroid: No thyromegaly.     Vascular: No JVD.  Cardiovascular:     Rate and Rhythm: Normal rate and regular rhythm.     Heart sounds: Normal heart sounds. No murmur heard.    No friction rub. No gallop.  Pulmonary:     Effort: Pulmonary effort is normal.     Breath sounds: Normal breath sounds. No wheezing, rhonchi or rales.  Abdominal:     General: Bowel sounds are normal.     Palpations: Abdomen is soft. There is no mass.     Tenderness: There is no abdominal tenderness. There is no guarding.   Musculoskeletal:        General: Normal range of motion.     Cervical back: Normal range of motion and neck supple.  Lymphadenopathy:     Cervical: No cervical adenopathy.  Skin:    General: Skin is warm and dry.  Neurological:     Mental Status: She is alert.     Deep Tendon Reflexes: Reflexes are normal and symmetric.     Wt Readings from Last 3 Encounters:  08/20/23 174 lb (78.9 kg)  08/10/23 171 lb 15.3 oz (78 kg)  08/05/23 174 lb (78.9 kg)    BP 110/70   Pulse 94   Ht 5\' 2"  (1.575 m)   Wt 174 lb (78.9 kg)   SpO2 91%   BMI 31.83 kg/m   Assessment and Plan:     Elizabeth Sauer, MD

## 2023-08-23 ENCOUNTER — Other Ambulatory Visit: Payer: Medicare PPO

## 2023-08-25 ENCOUNTER — Other Ambulatory Visit: Payer: Self-pay | Admitting: Orthopedic Surgery

## 2023-08-25 DIAGNOSIS — G8929 Other chronic pain: Secondary | ICD-10-CM

## 2023-08-25 DIAGNOSIS — M1711 Unilateral primary osteoarthritis, right knee: Secondary | ICD-10-CM

## 2023-08-30 ENCOUNTER — Telehealth: Payer: Self-pay | Admitting: Family Medicine

## 2023-08-30 NOTE — Telephone Encounter (Signed)
Copied from CRM [id number]. Topic: General - Inquiry >> Aug 30, 2023 11:28 AM Lindsey George wrote: Reason for CRM: The patient called in stating her handicap placard expired today and needs a new one from her provider to put in her car. She states she got her last one from her Urologist. Please assist patient further.

## 2023-08-31 ENCOUNTER — Telehealth: Payer: Self-pay | Admitting: Family Medicine

## 2023-08-31 NOTE — Telephone Encounter (Signed)
Copied from CRM [id number]. Topic: General - Other >> Aug 31, 2023  9:44 AM Phill Myron wrote: Ms Rouser returning call regarding Car placard/hangtag . Also, Ms Schier would  like to discuss getting a flu shot. Please advise

## 2023-09-01 DIAGNOSIS — Z6831 Body mass index (BMI) 31.0-31.9, adult: Secondary | ICD-10-CM | POA: Diagnosis not present

## 2023-09-01 DIAGNOSIS — M48062 Spinal stenosis, lumbar region with neurogenic claudication: Secondary | ICD-10-CM | POA: Diagnosis not present

## 2023-09-01 DIAGNOSIS — M1711 Unilateral primary osteoarthritis, right knee: Secondary | ICD-10-CM | POA: Diagnosis not present

## 2023-09-01 DIAGNOSIS — E119 Type 2 diabetes mellitus without complications: Secondary | ICD-10-CM | POA: Diagnosis not present

## 2023-09-07 ENCOUNTER — Encounter: Payer: Self-pay | Admitting: Orthopedic Surgery

## 2023-09-08 DIAGNOSIS — Z1331 Encounter for screening for depression: Secondary | ICD-10-CM | POA: Diagnosis not present

## 2023-09-08 DIAGNOSIS — R3915 Urgency of urination: Secondary | ICD-10-CM | POA: Diagnosis not present

## 2023-09-08 DIAGNOSIS — Z01411 Encounter for gynecological examination (general) (routine) with abnormal findings: Secondary | ICD-10-CM | POA: Diagnosis not present

## 2023-09-08 DIAGNOSIS — Z1339 Encounter for screening examination for other mental health and behavioral disorders: Secondary | ICD-10-CM | POA: Diagnosis not present

## 2023-09-08 DIAGNOSIS — N952 Postmenopausal atrophic vaginitis: Secondary | ICD-10-CM | POA: Diagnosis not present

## 2023-09-10 ENCOUNTER — Ambulatory Visit: Payer: Medicare PPO | Admitting: Family Medicine

## 2023-09-13 ENCOUNTER — Ambulatory Visit: Payer: Medicare PPO | Admitting: Family Medicine

## 2023-09-13 ENCOUNTER — Ambulatory Visit: Payer: Self-pay

## 2023-09-13 ENCOUNTER — Encounter: Payer: Self-pay | Admitting: Family Medicine

## 2023-09-13 ENCOUNTER — Ambulatory Visit
Admission: RE | Admit: 2023-09-13 | Discharge: 2023-09-13 | Disposition: A | Discharge status: Home / Self Care | Payer: Medicare PPO | Source: Ambulatory Visit | Attending: Orthopedic Surgery | Admitting: Orthopedic Surgery

## 2023-09-13 VITALS — BP 140/82 | HR 87 | Temp 99.0°F | Ht 62.0 in | Wt 179.8 lb

## 2023-09-13 DIAGNOSIS — M25561 Pain in right knee: Secondary | ICD-10-CM | POA: Diagnosis not present

## 2023-09-13 DIAGNOSIS — R310 Gross hematuria: Secondary | ICD-10-CM

## 2023-09-13 DIAGNOSIS — K625 Hemorrhage of anus and rectum: Secondary | ICD-10-CM

## 2023-09-13 DIAGNOSIS — G8929 Other chronic pain: Secondary | ICD-10-CM

## 2023-09-13 DIAGNOSIS — M23351 Other meniscus derangements, posterior horn of lateral meniscus, right knee: Secondary | ICD-10-CM | POA: Diagnosis not present

## 2023-09-13 DIAGNOSIS — M1711 Unilateral primary osteoarthritis, right knee: Secondary | ICD-10-CM

## 2023-09-13 DIAGNOSIS — M23321 Other meniscus derangements, posterior horn of medial meniscus, right knee: Secondary | ICD-10-CM | POA: Diagnosis not present

## 2023-09-13 LAB — POCT URINALYSIS DIPSTICK
Bilirubin, UA: NEGATIVE
Blood, UA: NEGATIVE
Glucose, UA: NEGATIVE
Ketones, UA: NEGATIVE
Leukocytes, UA: NEGATIVE
Nitrite, UA: NEGATIVE
Protein, UA: NEGATIVE
Spec Grav, UA: 1.015 (ref 1.010–1.025)
Urobilinogen, UA: 0.2 U/dL
pH, UA: 5 (ref 5.0–8.0)

## 2023-09-13 MED ORDER — DOCUSATE SODIUM 50 MG PO CAPS
50.0000 mg | ORAL_CAPSULE | Freq: Two times a day (BID) | ORAL | 0 refills | Status: DC
Start: 2023-09-13 — End: 2023-11-04

## 2023-09-13 NOTE — Progress Notes (Signed)
Date:  09/13/2023   Name:  Lindsey George   DOB:  03-14-1951   MRN:  [id number]   Chief Complaint: Rectal Bleeding (Rectal bleeding since yesterday pt believe it may be Hemorids )  Rectal Bleeding  The current episode started more than 1 week ago. The onset was gradual. The problem has been gradually improving. The pain is mild. The stool is described as hard. Associated symptoms include hemorrhoids and hematuria. Pertinent negatives include no anorexia, no fever, no abdominal pain, no diarrhea, no hematemesis, no nausea, no rectal pain, no vomiting, no vaginal bleeding, no vaginal discharge, no chest pain, no headaches, no coughing, no difficulty breathing and no rash.    Lab Results  Component Value Date   NA 140 11/24/2022   K 4.3 11/24/2022   CO2 25 11/24/2022   GLUCOSE 118 (H) 11/24/2022   BUN 14 11/24/2022   CREATININE 0.62 11/24/2022   CALCIUM 10.6 (H) 11/24/2022   EGFR 95 11/24/2022   GFRNONAA 92 11/11/2020   Lab Results  Component Value Date   CHOL 139 03/12/2023   HDL 57 03/12/2023   LDLCALC 60 03/12/2023   TRIG 122 03/12/2023   CHOLHDL 2.9 09/19/2018   No results found for: "TSH" Lab Results  Component Value Date   HGBA1C 7.0 (H) 07/12/2023   Lab Results  Component Value Date   WBC 8.3 07/20/2023   HGB 12.6 07/20/2023   HCT 39.4 07/20/2023   MCV 81.1 07/20/2023   PLT 351 07/20/2023   Lab Results  Component Value Date   ALT 15 11/24/2022   AST 12 11/24/2022   ALKPHOS 47 11/24/2022   BILITOT 0.4 11/24/2022   No results found for: "25OHVITD2", "25OHVITD3", "VD25OH"   Review of Systems  Constitutional:  Negative for fever.  Respiratory:  Negative for cough.   Cardiovascular:  Negative for chest pain.  Gastrointestinal:  Positive for hematochezia and hemorrhoids. Negative for abdominal pain, anorexia, diarrhea, hematemesis, nausea, rectal pain and vomiting.  Endocrine: Negative for polydipsia and polyuria.  Genitourinary:  Positive for  hematuria. Negative for decreased urine volume, dysuria, frequency, menstrual problem, vaginal bleeding and vaginal discharge.  Skin:  Negative for rash.  Neurological:  Negative for headaches.    Patient Active Problem List   Diagnosis Date Noted   Acute cough 11/04/2022   Reactive airway disease, mild intermittent, uncomplicated 07/16/2022   Change in bowel habits 12/10/2021   Pes anserinus bursitis of left knee 10/08/2021   DDD (degenerative disc disease), cervical 10/08/2021   Impingement syndrome of left shoulder 08/18/2021   Hand pain, left 06/26/2021   Loss of memory 06/26/2021   Tremor 06/26/2021   Lumbar stenosis with neurogenic claudication 08/27/2020   Chronic bilateral low back pain with bilateral sciatica 08/27/2020   Essential hypertension 03/06/2020   Cervical radiculopathy 03/06/2020   Asymptomatic varicose veins of both lower extremities 03/06/2020   Morbid obesity (HCC) 03/29/2019   Allergic rhinitis 01/19/2018   Hx of adenomatous colonic polyps 01/14/2018   Reactive airway disease without complication 01/12/2017   Back ache 05/22/2015   Alimentary obesity 05/22/2015   Type 2 diabetes mellitus without complication, without long-term current use of insulin (HCC) 04/12/2014   Hypercholesterolemia 04/12/2014   OP (osteoporosis) 04/12/2014    Allergies  Allergen Reactions   Codeine Other (See Comments)    Abdominal pain    Past Surgical History:  Procedure Laterality Date   ABDOMINAL HYSTERECTOMY     BREAST BIOPSY Right 06/22/11   bx/clip-neg  BREAST CYST ASPIRATION Left    neg   COLONOSCOPY  2014   cleared for 5 yrs- [facility] doc   COLONOSCOPY WITH PROPOFOL N/A 03/25/2018   Procedure: COLONOSCOPY WITH PROPOFOL;  Surgeon: Scot Jun, MD;  Location: [facility] ENDOSCOPY;  Service: Endoscopy;  Laterality: N/A;   COLONOSCOPY WITH PROPOFOL N/A 04/20/2023   Procedure: COLONOSCOPY WITH PROPOFOL;  Surgeon: Regis Bill, MD;  Location: [facility] ENDOSCOPY;  Service:  Endoscopy;  Laterality: N/A;  NEEDS AM; HAS A GRADUATION AT 6:30 PM   PARTIAL HYSTERECTOMY     SPLENECTOMY      Social History   Tobacco Use   Smoking status: Never   Smokeless tobacco: Never   Tobacco comments:    smoking cessation materials not required  Vaping Use   Vaping status: Never Used  Substance Use Topics   Alcohol use: Not Currently   Drug use: Never     Medication list has been reviewed and updated.  Current Meds  Medication Sig   albuterol (VENTOLIN HFA) 108 (90 Base) MCG/ACT inhaler Inhale 2 puffs into the lungs every 6 (six) hours as needed for wheezing or shortness of breath.   alendronate (FOSAMAX) 70 MG tablet TAKE 1 TABLET BY MOUTH ONCE A WEEK WITH A FULL GLASS OF WATER ON AN EMPTY STOMACH   aspirin 81 MG tablet Take 81 mg by mouth daily.   Baclofen 5 MG TABS Take 1 tablet (5 mg total) by mouth at bedtime as needed.   calcium-vitamin D (OSCAL WITH D) 500-200 MG-UNIT tablet Take 1 tablet by mouth daily with breakfast.   carbidopa-levodopa (SINEMET IR) 25-100 MG tablet Take 1 tablet by mouth in the morning and at bedtime.   Cholecalciferol (VITAMIN D3 PO) Take 1 capsule by mouth daily.   DULoxetine (CYMBALTA) 30 MG capsule Take 2 capsules daily= 60mg  a day   gabapentin (NEURONTIN) 300 MG capsule Take 1 capsule (300 mg total) by mouth 3 (three) times daily.   glipiZIDE (GLIPIZIDE XL) 5 MG 24 hr tablet Take 1 tablet (5 mg total) by mouth daily.   losartan (COZAAR) 50 MG tablet Take 1 tablet by mouth once daily   metFORMIN (GLUCOPHAGE) 500 MG tablet TAKE ONE TABLET BY MOUTH TWICE DAILY   Multiple Vitamin (MULTIVITAMIN) capsule Take 1 capsule by mouth daily.   ONETOUCH ULTRA test strip USE 1 STRIP TO CHECK GLUCOSE ONCE DAILY   pioglitazone (ACTOS) 15 MG tablet Take 1 tablet (15 mg total) by mouth daily.   simvastatin (ZOCOR) 40 MG tablet Take 1 tablet (40 mg total) by mouth daily.   Current Facility-Administered Medications for the 09/13/23 encounter (Office  Visit) with Duanne Limerick, MD  Medication   ipratropium-albuterol (DUONEB) 0.5-2.5 (3) MG/3ML nebulizer solution 3 mL       08/20/2023   11:26 AM 07/20/2023   11:14 AM 07/12/2023    9:44 AM 03/12/2023   10:49 AM  GAD 7 : Generalized Anxiety Score  Nervous, Anxious, on Edge 0 0 0 0  Control/stop worrying 0 0 0 0  Worry too much - different things 0 0 0 0  Trouble relaxing 0 0 0 0  Restless 0 0 0 0  Easily annoyed or irritable 0 0 0 0  Afraid - awful might happen 0 0 0 0  Total GAD 7 Score 0 0 0 0  Anxiety Difficulty Not difficult at all Not difficult at all Not difficult at all Not difficult at all       08/20/2023  11:26 AM 07/21/2023    9:06 AM 07/21/2023    9:03 AM  Depression screen PHQ 2/9  Decreased Interest 0 0 0  Down, Depressed, Hopeless 0 0 0  PHQ - 2 Score 0 0 0  Altered sleeping 0 0 0  Tired, decreased energy 0 0 0  Change in appetite 0 0 0  Feeling bad or failure about yourself  0 0 0  Trouble concentrating 0 0 0  Moving slowly or fidgety/restless 0 0 0  Suicidal thoughts 0 0 0  PHQ-9 Score 0 0 0  Difficult doing work/chores Not difficult at all Not difficult at all Not difficult at all    BP Readings from Last 3 Encounters:  09/13/23 (!) 140/82  08/20/23 110/70  08/10/23 (!) 155/85    Physical Exam Vitals and nursing note reviewed.  HENT:     Head: Normocephalic and atraumatic.     Right Ear: Tympanic membrane and ear canal normal.     Left Ear: Tympanic membrane and ear canal normal.     Nose: Nose normal.     Mouth/Throat:     Mouth: Mucous membranes are moist.     Pharynx: No oropharyngeal exudate or posterior oropharyngeal erythema.  Eyes:     Pupils: Pupils are equal, round, and reactive to light.  Cardiovascular:     Rate and Rhythm: Normal rate.     Heart sounds: No murmur heard.    No friction rub. No gallop.  Pulmonary:     Effort: Pulmonary effort is normal.  Genitourinary:    Rectum: Normal. Guaiac result negative. No mass,  tenderness, anal fissure, external hemorrhoid or internal hemorrhoid. Normal anal tone.  Musculoskeletal:     Cervical back: Normal range of motion.  Neurological:     Mental Status: She is alert.     Wt Readings from Last 3 Encounters:  09/13/23 179 lb 12.8 oz (81.6 kg)  08/20/23 174 lb (78.9 kg)  08/10/23 171 lb 15.3 oz (78 kg)    BP (!) 140/82 (BP Location: Left Arm, Patient Position: Sitting, Cuff Size: Large)   Pulse 87   Temp 99 F (37.2 C) (Oral)   Ht 5\' 2"  (1.575 m)   Wt 179 lb 12.8 oz (81.6 kg)   SpO2 92%   BMI 32.89 kg/m   Assessment and Plan: 1. BRBPR (bright red blood per rectum) New onset.  Patient had bright red blood primarily seen on tissue in which she cleans her self.  There is no rectal pain.  Guaiac was noted to be negative.  There was no significant external or internal hemorrhoids or rectal mass.  Will check CBC to see if hemoglobin is stable and suggest Colace in case there was a small fissure that was open when patient had bowel movement. - CBC with Differential/Platelet - docusate sodium (COLACE) 50 MG capsule; Take 1 capsule (50 mg total) by mouth 2 (two) times daily.  Dispense: 20 capsule; Refill: 0  2. Gross hematuria Patient has history of gross hematuria for which she was treated with 10 pills over 5 days twice a day by her gastroenterologist presumably for a urinary tract infection.  Dipstick was performed today and it was unremarkable. - POCT Urinalysis Dipstick     Elizabeth Sauer, MD

## 2023-09-13 NOTE — Telephone Encounter (Signed)
  Chief Complaint: rectal bleeding with and without having BM  Symptoms: brownish red blood noted on toilet paper and spots on bed linen Frequency: yest Pertinent Negatives: Patient denies and pain and dizziness, clots Disposition: [] ED /[] Urgent Care (no appt availability in office) / [x] Appointment(In office/virtual)/ []  [facility]/ [] Home Care/ [] Refused Recommended Disposition /[] [facility]/ []  Follow-up with PCP Additional Notes: appt today with PCP Reason for Disposition  MODERATE rectal bleeding (small blood clots, passing blood without stool, or toilet water turns red)  Answer Assessment - Initial Assessment Questions 1. APPEARANCE of BLOOD: "What color is it?" "Is it passed separately, on the surface of the stool, or mixed in with the stool?"      Reddish brown  2. AMOUNT: "How much blood was passed?"      On toilet paper 3. FREQUENCY: "How many times has blood been passed with the stools?"      On toilet paper 4. ONSET: "When was the blood first seen in the stools?" (Days or weeks)      Yes  5. DIARRHEA: "Is there also some diarrhea?" If Yes, ask: "How many diarrhea stools in the past 24 hours?"      no 6. CONSTIPATION: "Do you have constipation?" If Yes, ask: "How bad is it?"     Yes- strain LBM-  7. RECURRENT SYMPTOMS: "Have you had blood in your stools before?" If Yes, ask: "When was the last time?" and "What happened that time?"      no 8. BLOOD THINNERS: "Do you take any blood thinners?" (e.g., Coumadin/warfarin, Pradaxa/dabigatran, aspirin)     no 9. OTHER SYMPTOMS: "Do you have any other symptoms?"  (e.g., abdomen pain, vomiting, dizziness, fever)     Noted blood on sheets  10. PREGNANCY: "Is there any chance you are pregnant?" "When was your last menstrual period?"       *No Answer*  Protocols used: Rectal Bleeding-A-AH

## 2023-09-14 ENCOUNTER — Ambulatory Visit: Payer: Medicare PPO | Admitting: Family Medicine

## 2023-09-14 ENCOUNTER — Other Ambulatory Visit: Payer: Self-pay

## 2023-09-14 LAB — CBC WITH DIFFERENTIAL/PLATELET
Basophils Absolute: 0 10*3/uL (ref 0.0–0.2)
Basos: 0 %
EOS (ABSOLUTE): 0.6 10*3/uL — ABNORMAL HIGH (ref 0.0–0.4)
Eos: 4 %
Hematocrit: 36.7 % (ref 34.0–46.6)
Hemoglobin: 11.7 g/dL (ref 11.1–15.9)
Immature Grans (Abs): 0 10*3/uL (ref 0.0–0.1)
Immature Granulocytes: 0 %
Lymphocytes Absolute: 0.8 10*3/uL (ref 0.7–3.1)
Lymphs: 6 %
MCH: 25.7 pg — ABNORMAL LOW (ref 26.6–33.0)
MCHC: 31.9 g/dL (ref 31.5–35.7)
MCV: 81 fL (ref 79–97)
Monocytes Absolute: 0.8 10*3/uL (ref 0.1–0.9)
Monocytes: 6 %
Neutrophils Absolute: 11.5 10*3/uL — ABNORMAL HIGH (ref 1.4–7.0)
Neutrophils: 84 %
Platelets: 440 10*3/uL (ref 150–450)
RBC: 4.55 x10E6/uL (ref 3.77–5.28)
RDW: 13.9 % (ref 11.7–15.4)
WBC: 13.7 10*3/uL — ABNORMAL HIGH (ref 3.4–10.8)

## 2023-09-14 MED ORDER — AZITHROMYCIN 250 MG PO TABS: ORAL_TABLET | ORAL | 0 refills | Status: AC

## 2023-09-14 NOTE — Progress Notes (Signed)
Zpack sent in.

## 2023-09-24 ENCOUNTER — Ambulatory Visit (INDEPENDENT_AMBULATORY_CARE_PROVIDER_SITE_OTHER): Payer: Medicare PPO

## 2023-09-24 DIAGNOSIS — Z23 Encounter for immunization: Secondary | ICD-10-CM | POA: Diagnosis not present

## 2023-09-25 ENCOUNTER — Other Ambulatory Visit: Payer: Self-pay | Admitting: Family Medicine

## 2023-09-25 DIAGNOSIS — M5412 Radiculopathy, cervical region: Secondary | ICD-10-CM

## 2023-09-27 DIAGNOSIS — D2261 Melanocytic nevi of right upper limb, including shoulder: Secondary | ICD-10-CM | POA: Diagnosis not present

## 2023-09-27 DIAGNOSIS — L821 Other seborrheic keratosis: Secondary | ICD-10-CM | POA: Diagnosis not present

## 2023-09-27 DIAGNOSIS — D225 Melanocytic nevi of trunk: Secondary | ICD-10-CM | POA: Diagnosis not present

## 2023-09-27 DIAGNOSIS — D2262 Melanocytic nevi of left upper limb, including shoulder: Secondary | ICD-10-CM | POA: Diagnosis not present

## 2023-09-27 DIAGNOSIS — D2271 Melanocytic nevi of right lower limb, including hip: Secondary | ICD-10-CM | POA: Diagnosis not present

## 2023-09-27 DIAGNOSIS — D2272 Melanocytic nevi of left lower limb, including hip: Secondary | ICD-10-CM | POA: Diagnosis not present

## 2023-10-11 ENCOUNTER — Other Ambulatory Visit: Payer: Self-pay

## 2023-10-13 DIAGNOSIS — S83231A Complex tear of medial meniscus, current injury, right knee, initial encounter: Secondary | ICD-10-CM | POA: Diagnosis not present

## 2023-10-13 DIAGNOSIS — M1711 Unilateral primary osteoarthritis, right knee: Secondary | ICD-10-CM | POA: Diagnosis not present

## 2023-10-18 ENCOUNTER — Other Ambulatory Visit: Payer: Self-pay | Admitting: Family Medicine

## 2023-10-18 DIAGNOSIS — I10 Essential (primary) hypertension: Secondary | ICD-10-CM

## 2023-10-22 ENCOUNTER — Other Ambulatory Visit: Payer: Self-pay | Admitting: Surgery

## 2023-10-26 ENCOUNTER — Ambulatory Visit
Admission: RE | Admit: 2023-10-26 | Discharge: 2023-10-26 | Disposition: A | Discharge status: Home / Self Care | Payer: Medicare PPO | Source: Ambulatory Visit | Attending: Family Medicine | Admitting: Family Medicine

## 2023-10-26 ENCOUNTER — Encounter: Payer: Self-pay | Admitting: Family Medicine

## 2023-10-26 DIAGNOSIS — M810 Age-related osteoporosis without current pathological fracture: Secondary | ICD-10-CM | POA: Diagnosis not present

## 2023-10-27 ENCOUNTER — Other Ambulatory Visit: Payer: Self-pay

## 2023-10-27 ENCOUNTER — Encounter
Admission: RE | Admit: 2023-10-27 | Discharge: 2023-10-27 | Disposition: A | Discharge status: Home / Self Care | Payer: Medicare PPO | Source: Ambulatory Visit | Attending: Surgery | Admitting: Surgery

## 2023-10-27 VITALS — Ht 61.0 in | Wt 172.0 lb

## 2023-10-27 DIAGNOSIS — E119 Type 2 diabetes mellitus without complications: Secondary | ICD-10-CM

## 2023-10-27 DIAGNOSIS — I10 Essential (primary) hypertension: Secondary | ICD-10-CM

## 2023-10-27 NOTE — Patient Instructions (Addendum)
Your procedure is scheduled on: Thursday 11/04/23 To find out your arrival time, please call [phone] between 1PM - 3PM on:  Wednesday 11/03/23  Report to the Registration Desk on the 1st floor of the [facility]. FREE Valet parking is available.  If your arrival time is 6:00 am, do not arrive before that time as the [facility] entrance doors do not open until 6:00 am.  REMEMBER: Instructions that are not followed completely may result in serious medical risk, up to and including death; or upon the discretion of your surgeon and anesthesiologist your surgery may need to be rescheduled.  Do not eat food after midnight the night before surgery.  No gum chewing or hard candies.  You may however, drink CLEAR liquids up to 2 hours before you are scheduled to arrive for your surgery. Do not drink anything within 2 hours of your scheduled arrival time.  Clear liquids include: - water  Type 1 and Type 2 diabetics should only drink water.  In addition, your doctor has ordered for you to drink the provided:   Gatorade G2 Drinking this carbohydrate drink up to two hours before surgery helps to reduce insulin resistance and improve patient outcomes. Please complete drinking 2 hours before scheduled arrival time.  One week prior to surgery: Stop Anti-inflammatories (NSAIDS) such as Advil, Aleve, Ibuprofen, Motrin, Naproxen, Naprosyn and Aspirin based products such as Excedrin, Goody's Powder, BC Powder. You may however, continue to take Tylenol if needed for pain up until the day of surgery.  Stop ANY OVER THE COUNTER supplements and vitamins for 7 days (last day to take Thursday 10/28/23) until after surgery.  Continue taking all prescribed medications with the exception of the following: Metformin, hold for surgery 2 days. Last dose Monday night 11/01/23.  TAKE ONLY THESE MEDICATIONS THE MORNING OF SURGERY WITH A SIP OF WATER:  carbidopa-levodopa (SINEMET IR) 25-100 MG tablet   DULoxetine (CYMBALTA) 60 MG capsule  gabapentin (NEURONTIN) 300 MG capsule   Use inhalers on the day of surgery and bring to the hospital. You can skip this since you have not used in over a year.  No Alcohol for 24 hours before or after surgery.  No Smoking including e-cigarettes for 24 hours before surgery.  No chewable tobacco products for at least 6 hours before surgery.  No nicotine patches on the day of surgery.  Do not use any "recreational" drugs for at least a week (preferably 2 weeks) before your surgery.  Please be advised that the combination of cocaine and anesthesia may have negative outcomes, up to and including death. If you test positive for cocaine, your surgery will be cancelled.  On the morning of surgery brush your teeth with toothpaste and water, you may rinse your mouth with mouthwash if you wish. Do not swallow any toothpaste or mouthwash.  Use CHG Soap or wipes as directed on instruction sheet.  Do not wear lotions, powders, or perfumes.   Do not shave body hair from the neck down 48 hours before surgery.  Wear clean comfortable clothing (specific to your surgery type) to the hospital.  Do not wear jewelry, make-up, hairpins, clips or toenail polish.  For welded (permanent) jewelry: bracelets, anklets, waist bands, etc.  Please have this removed prior to surgery.  If it is not removed, there is a chance that hospital personnel will need to cut it off on the day of surgery. Contact lenses, hearing aids and dentures may not be worn into surgery.  Do not bring valuables to the hospital. [facility] is not responsible for any missing/lost belongings or valuables.   Notify your doctor if there is any change in your medical condition (cold, fever, infection).  If you are being discharged the day of surgery, you will not be allowed to drive home. You will need a responsible individual to drive you home and stay with you for 24 hours after surgery.   If you  are taking public transportation, you will need to have a responsible individual with you.  If you are being admitted to the hospital overnight, leave your suitcase in the car. After surgery it may be brought to your room.  In case of increased patient census, it may be necessary for you, the patient, to continue your postoperative care in the Same Day Surgery department.  After surgery, you can help prevent lung complications by doing breathing exercises.  Take deep breaths and cough every 1-2 hours. Your doctor may order a device called an Incentive Spirometer to help you take deep breaths. When coughing or sneezing, hold a pillow firmly against your incision with both hands. This is called "splinting." Doing this helps protect your incision. It also decreases belly discomfort.  Surgery Visitation Policy:  Patients undergoing a surgery or procedure may have two family members or support persons with them as long as the person is not COVID-19 positive or experiencing its symptoms.   Inpatient Visitation:    Visiting hours are 7 a.m. to 8 p.m. Up to four visitors are allowed at one time in a patient room. The visitors may rotate out with other people during the day. One designated support person (adult) may remain overnight.  Please call the Pre-admissions Testing Dept. at [phone] if you have any questions about these instructions.     Preparing for Surgery with CHLORHEXIDINE GLUCONATE (CHG) Soap  Chlorhexidine Gluconate (CHG) Soap  o An antiseptic cleaner that kills germs and bonds with the skin to continue killing germs even after washing  o Used for showering the night before surgery and morning of surgery  Before surgery, you can play an important role by reducing the number of germs on your skin.  CHG (Chlorhexidine gluconate) soap is an antiseptic cleanser which kills germs and bonds with the skin to continue killing germs even after washing.  Please do not use if you  have an allergy to CHG or antibacterial soaps. If your skin becomes reddened/irritated stop using the CHG.  1. Shower the NIGHT BEFORE SURGERY and the MORNING OF SURGERY with CHG soap.  2. If you choose to wash your hair, wash your hair first as usual with your normal shampoo.  3. After shampooing, rinse your hair and body thoroughly to remove the shampoo.  4. Use CHG as you would any other liquid soap. You can apply CHG directly to the skin and wash gently with a scrungie or a clean washcloth.  5. Apply the CHG soap to your body only from the neck down. Do not use on open wounds or open sores. Avoid contact with your eyes, ears, mouth, and genitals (private parts). Wash face and genitals (private parts) with your normal soap.  6. Wash thoroughly, paying special attention to the area where your surgery will be performed.  7. Thoroughly rinse your body with warm water.  8. Do not shower/wash with your normal soap after using and rinsing off the CHG soap.  9. Pat yourself dry with a clean towel.  10. Wear  clean pajamas to bed the night before surgery.  12. Place clean sheets on your bed the night of your first shower and do not sleep with pets.  13. Shower again with the CHG soap on the day of surgery prior to arriving at the hospital.  14. Do not apply any deodorants/lotions/powders.  15. Please wear clean clothes to the hospital.

## 2023-10-30 ENCOUNTER — Other Ambulatory Visit: Payer: Self-pay | Admitting: Family Medicine

## 2023-10-30 DIAGNOSIS — M5412 Radiculopathy, cervical region: Secondary | ICD-10-CM

## 2023-11-01 ENCOUNTER — Encounter: Payer: Self-pay | Admitting: Urgent Care

## 2023-11-01 ENCOUNTER — Encounter
Admission: RE | Admit: 2023-11-01 | Discharge: 2023-11-01 | Disposition: A | Discharge status: Home / Self Care | Payer: Medicare PPO | Source: Ambulatory Visit | Attending: Surgery | Admitting: Surgery

## 2023-11-01 DIAGNOSIS — I10 Essential (primary) hypertension: Secondary | ICD-10-CM | POA: Insufficient documentation

## 2023-11-01 DIAGNOSIS — E119 Type 2 diabetes mellitus without complications: Secondary | ICD-10-CM | POA: Insufficient documentation

## 2023-11-01 DIAGNOSIS — R9431 Abnormal electrocardiogram [ECG] [EKG]: Secondary | ICD-10-CM | POA: Insufficient documentation

## 2023-11-01 DIAGNOSIS — Z01818 Encounter for other preprocedural examination: Secondary | ICD-10-CM | POA: Insufficient documentation

## 2023-11-01 DIAGNOSIS — Z01810 Encounter for preprocedural cardiovascular examination: Secondary | ICD-10-CM | POA: Diagnosis not present

## 2023-11-01 LAB — BASIC METABOLIC PANEL
Anion gap: 9 (ref 5–15)
BUN: 17 mg/dL (ref 8–23)
CO2: 28 mmol/L (ref 22–32)
Calcium: 9.4 mg/dL (ref 8.9–10.3)
Chloride: 99 mmol/L (ref 98–111)
Creatinine, Ser: 0.45 mg/dL (ref 0.44–1.00)
GFR, Estimated: >60 mL/min (ref >60)
Glucose, Bld: 124 mg/dL — ABNORMAL HIGH (ref 70–99)
Potassium: 3.7 mmol/L (ref 3.5–5.1)
Sodium: 136 mmol/L (ref 135–145)

## 2023-11-03 MED ORDER — SODIUM CHLORIDE 0.9 % IV SOLN: INTRAVENOUS | Status: DC

## 2023-11-03 MED ORDER — ORAL CARE MOUTH RINSE: 15.0000 mL | Freq: Once | OROMUCOSAL | Status: AC

## 2023-11-03 MED ORDER — CEFAZOLIN SODIUM-DEXTROSE 2-4 GM/100ML-% IV SOLN
2.0000 g | INTRAVENOUS | Status: AC
  Administered 2023-11-04: 2 g via INTRAVENOUS

## 2023-11-03 MED ORDER — CHLORHEXIDINE GLUCONATE 0.12 % MT SOLN
15.0000 mL | Freq: Once | OROMUCOSAL | Status: AC
  Administered 2023-11-04: 15 mL via OROMUCOSAL

## 2023-11-04 ENCOUNTER — Ambulatory Visit
Admission: RE | Admit: 2023-11-04 | Discharge: 2023-11-04 | Disposition: A | Discharge status: Home / Self Care | Payer: Medicare PPO | Attending: Surgery | Admitting: Surgery

## 2023-11-04 ENCOUNTER — Ambulatory Visit: Payer: Medicare PPO | Admitting: Anesthesiology

## 2023-11-04 ENCOUNTER — Encounter
Admission: RE | Disposition: A | Discharge status: Home / Self Care | Payer: Self-pay | Source: Home / Self Care | Attending: Surgery

## 2023-11-04 ENCOUNTER — Other Ambulatory Visit: Payer: Self-pay

## 2023-11-04 ENCOUNTER — Encounter: Payer: Self-pay | Admitting: Surgery

## 2023-11-04 DIAGNOSIS — Z7984 Long term (current) use of oral hypoglycemic drugs: Secondary | ICD-10-CM | POA: Diagnosis not present

## 2023-11-04 DIAGNOSIS — M1711 Unilateral primary osteoarthritis, right knee: Secondary | ICD-10-CM | POA: Diagnosis not present

## 2023-11-04 DIAGNOSIS — E119 Type 2 diabetes mellitus without complications: Secondary | ICD-10-CM | POA: Diagnosis not present

## 2023-11-04 DIAGNOSIS — M1712 Unilateral primary osteoarthritis, left knee: Secondary | ICD-10-CM | POA: Diagnosis not present

## 2023-11-04 DIAGNOSIS — M5412 Radiculopathy, cervical region: Secondary | ICD-10-CM

## 2023-11-04 DIAGNOSIS — S83231A Complex tear of medial meniscus, current injury, right knee, initial encounter: Secondary | ICD-10-CM | POA: Diagnosis not present

## 2023-11-04 DIAGNOSIS — K625 Hemorrhage of anus and rectum: Secondary | ICD-10-CM

## 2023-11-04 DIAGNOSIS — X58XXXA Exposure to other specified factors, initial encounter: Secondary | ICD-10-CM | POA: Diagnosis not present

## 2023-11-04 DIAGNOSIS — E7800 Pure hypercholesterolemia, unspecified: Secondary | ICD-10-CM

## 2023-11-04 DIAGNOSIS — M5030 Other cervical disc degeneration, unspecified cervical region: Secondary | ICD-10-CM

## 2023-11-04 HISTORY — PX: KNEE ARTHROSCOPY: SHX127

## 2023-11-04 LAB — GLUCOSE, CAPILLARY
Glucose-Capillary: 136 mg/dL — ABNORMAL HIGH (ref 70–99)
Glucose-Capillary: 135 mg/dL — ABNORMAL HIGH (ref 70–99)

## 2023-11-04 SURGERY — ARTHROSCOPY, KNEE
Anesthesia: General | Site: Knee | Laterality: Right

## 2023-11-04 MED ORDER — DOCUSATE SODIUM 50 MG PO CAPS: 50.0000 mg | ORAL_CAPSULE | Freq: Every day | ORAL | Status: AC | PRN

## 2023-11-04 MED ORDER — ONDANSETRON HCL 4 MG PO TABS: 4.0000 mg | ORAL_TABLET | Freq: Four times a day (QID) | ORAL | Status: DC | PRN

## 2023-11-04 MED ORDER — OXYCODONE HCL 5 MG/5ML PO SOLN: 5.0000 mg | Freq: Once | ORAL | Status: DC | PRN

## 2023-11-04 MED ORDER — DEXAMETHASONE SODIUM PHOSPHATE 10 MG/ML IJ SOLN
INTRAMUSCULAR | Status: AC
Start: 2023-11-04 — End: ?
  Filled 2023-11-04: qty 1

## 2023-11-04 MED ORDER — RINGERS IRRIGATION IR SOLN
Status: DC | PRN
  Administered 2023-11-04: 1500 mL

## 2023-11-04 MED ORDER — DEXAMETHASONE SODIUM PHOSPHATE 10 MG/ML IJ SOLN
INTRAMUSCULAR | Status: DC | PRN
  Administered 2023-11-04: 5 mg via INTRAVENOUS

## 2023-11-04 MED ORDER — BUPIVACAINE-EPINEPHRINE (PF) 0.5% -1:200000 IJ SOLN
INTRAMUSCULAR | Status: AC
  Filled 2023-11-04: qty 30

## 2023-11-04 MED ORDER — OXYCODONE HCL 5 MG PO TABS: 5.0000 mg | ORAL_TABLET | Freq: Once | ORAL | Status: DC | PRN

## 2023-11-04 MED ORDER — FENTANYL CITRATE (PF) 100 MCG/2ML IJ SOLN
INTRAMUSCULAR | Status: DC | PRN
  Administered 2023-11-04 (×4): 25 ug via INTRAVENOUS

## 2023-11-04 MED ORDER — HYDROCODONE-ACETAMINOPHEN 5-325 MG PO TABS: 1.0000 | ORAL_TABLET | ORAL | Status: DC | PRN

## 2023-11-04 MED ORDER — METOCLOPRAMIDE HCL 10 MG PO TABS: 5.0000 mg | ORAL_TABLET | Freq: Three times a day (TID) | ORAL | Status: DC | PRN

## 2023-11-04 MED ORDER — FENTANYL CITRATE (PF) 100 MCG/2ML IJ SOLN
25.0000 ug | INTRAMUSCULAR | Status: DC | PRN
Start: 2023-11-04 — End: 2023-11-04

## 2023-11-04 MED ORDER — METOCLOPRAMIDE HCL 5 MG/ML IJ SOLN: 5.0000 mg | Freq: Three times a day (TID) | INTRAMUSCULAR | Status: DC | PRN

## 2023-11-04 MED ORDER — KETOROLAC TROMETHAMINE 15 MG/ML IJ SOLN: 15.0000 mg | Freq: Once | INTRAMUSCULAR | Status: DC

## 2023-11-04 MED ORDER — FENTANYL CITRATE (PF) 100 MCG/2ML IJ SOLN
INTRAMUSCULAR | Status: AC
  Filled 2023-11-04: qty 2

## 2023-11-04 MED ORDER — ONDANSETRON HCL 4 MG/2ML IJ SOLN: 4.0000 mg | Freq: Four times a day (QID) | INTRAMUSCULAR | Status: DC | PRN

## 2023-11-04 MED ORDER — CEFAZOLIN SODIUM-DEXTROSE 2-4 GM/100ML-% IV SOLN
INTRAVENOUS | Status: AC
  Filled 2023-11-04: qty 100

## 2023-11-04 MED ORDER — EPHEDRINE SULFATE-NACL 50-0.9 MG/10ML-% IV SOSY
PREFILLED_SYRINGE | INTRAVENOUS | Status: DC | PRN
  Administered 2023-11-04 (×2): 10 mg via INTRAVENOUS

## 2023-11-04 MED ORDER — CHLORHEXIDINE GLUCONATE 0.12 % MT SOLN
OROMUCOSAL | Status: AC
  Filled 2023-11-04: qty 15

## 2023-11-04 MED ORDER — BUPIVACAINE-EPINEPHRINE (PF) 0.5% -1:200000 IJ SOLN
INTRAMUSCULAR | Status: DC | PRN
  Administered 2023-11-04 (×2): 30 mL

## 2023-11-04 MED ORDER — EPHEDRINE 5 MG/ML INJ
INTRAVENOUS | Status: AC
  Filled 2023-11-04: qty 5

## 2023-11-04 MED ORDER — LIDOCAINE HCL (CARDIAC) PF 100 MG/5ML IV SOSY
PREFILLED_SYRINGE | INTRAVENOUS | Status: DC | PRN
  Administered 2023-11-04: 60 mg via INTRAVENOUS

## 2023-11-04 MED ORDER — PROPOFOL 10 MG/ML IV BOLUS
INTRAVENOUS | Status: DC | PRN
  Administered 2023-11-04: 150 mg via INTRAVENOUS

## 2023-11-04 MED ORDER — HYDROCODONE-ACETAMINOPHEN 5-325 MG PO TABS: 1.0000 | ORAL_TABLET | Freq: Four times a day (QID) | ORAL | 0 refills | Status: DC | PRN

## 2023-11-04 MED ORDER — LIDOCAINE HCL (PF) 1 % IJ SOLN
INTRAMUSCULAR | Status: AC
  Filled 2023-11-04: qty 30

## 2023-11-04 MED ORDER — ONDANSETRON HCL 4 MG/2ML IJ SOLN
INTRAMUSCULAR | Status: AC
  Filled 2023-11-04: qty 2

## 2023-11-04 MED ORDER — SODIUM CHLORIDE 0.9 % IV SOLN: INTRAVENOUS | Status: DC

## 2023-11-04 MED ORDER — ACETAMINOPHEN 325 MG PO TABS: 325.0000 mg | ORAL_TABLET | Freq: Four times a day (QID) | ORAL | Status: DC | PRN

## 2023-11-04 MED ORDER — ONDANSETRON HCL 4 MG/2ML IJ SOLN
INTRAMUSCULAR | Status: DC | PRN
  Administered 2023-11-04: 4 mg via INTRAVENOUS

## 2023-11-04 MED ORDER — LIDOCAINE HCL 1 % IJ SOLN
INTRAMUSCULAR | Status: DC | PRN
  Administered 2023-11-04: 30 mL

## 2023-11-04 MED ORDER — BUPIVACAINE-EPINEPHRINE (PF) 0.5% -1:200000 IJ SOLN
INTRAMUSCULAR | Status: AC
  Filled 2023-11-04: qty 10

## 2023-11-04 MED ORDER — GABAPENTIN 300 MG PO CAPS: 300.0000 mg | ORAL_CAPSULE | Freq: Two times a day (BID) | ORAL | Status: AC

## 2023-11-04 MED ORDER — PHENYLEPHRINE 80 MCG/ML (10ML) SYRINGE FOR IV PUSH (FOR BLOOD PRESSURE SUPPORT)
PREFILLED_SYRINGE | INTRAVENOUS | Status: DC | PRN
  Administered 2023-11-04 (×4): 160 ug via INTRAVENOUS

## 2023-11-04 MED ORDER — LACTATED RINGERS IV SOLN: INTRAVENOUS | Status: DC | PRN

## 2023-11-04 SURGICAL SUPPLY — 37 items
BLADE FULL RADIUS 3.5 (BLADE) ×1 IMPLANT
BLADE SHAVER 4.5X7 STR FR (MISCELLANEOUS) ×1 IMPLANT
BNDG ELASTIC 6INX 5YD STR LF (GAUZE/BANDAGES/DRESSINGS) ×1 IMPLANT
BNDG ELASTIC 6X5.8 VLCR NS LF (GAUZE/BANDAGES/DRESSINGS) IMPLANT
BNDG ESMARCH 6X12 STRL LF (GAUZE/BANDAGES/DRESSINGS) ×1 IMPLANT
CATH ROBINSON RED A/P 12FR (CATHETERS) IMPLANT
CHLORAPREP W/TINT 26 (MISCELLANEOUS) ×1 IMPLANT
COLLECTOR GRAFT TISSUE (SYSTAGENIX WOUND MANAGEMENT) ×1 IMPLANT
CUFF TRNQT CYL 24X4X16.5-23 (TOURNIQUET CUFF) IMPLANT
CUFF TRNQT CYL 34X4.125X (TOURNIQUET CUFF) IMPLANT
CUP MEDICINE 2OZ PLAST GRAD ST (MISCELLANEOUS) ×1 IMPLANT
DRAPE ARTHROSCOPY W/POUCH 90 (DRAPES) ×1 IMPLANT
DRAPE IMP U-DRAPE 54X76 (DRAPES) ×1 IMPLANT
ELECT REM PT RETURN 9FT ADLT (ELECTROSURGICAL) ×1 IMPLANT
ELECTRODE REM PT RTRN 9FT ADLT (ELECTROSURGICAL) ×1 IMPLANT
GAUZE SPONGE 4X4 12PLY STRL (GAUZE/BANDAGES/DRESSINGS) ×1 IMPLANT
GAUZE XEROFORM 1X8 LF (GAUZE/BANDAGES/DRESSINGS) IMPLANT
GLOVE BIO SURGEON STRL SZ8 (GLOVE) ×2 IMPLANT
GLOVE INDICATOR 8.0 STRL GRN (GLOVE) ×1 IMPLANT
GOWN STRL REUS W/ TWL LRG LVL3 (GOWN DISPOSABLE) ×1 IMPLANT
GOWN STRL REUS W/ TWL XL LVL3 (GOWN DISPOSABLE) ×2 IMPLANT
IV LR IRRIG 3000ML ARTHROMATIC (IV SOLUTION) ×1 IMPLANT
KIT TURNOVER KIT A (KITS) ×1 IMPLANT
MANIFOLD NEPTUNE II (INSTRUMENTS) ×2 IMPLANT
NDL HYPO 21X1.5 SAFETY (NEEDLE) ×1 IMPLANT
NEEDLE HYPO 21X1.5 SAFETY (NEEDLE) ×1 IMPLANT
PACK ARTHROSCOPY KNEE (MISCELLANEOUS) ×1 IMPLANT
SLEEVE REMOTE CONTROL 5X12 (DRAPES) IMPLANT
SUT PROLENE 4 0 PS 2 18 (SUTURE) ×1 IMPLANT
SYR 30ML LL (SYRINGE) ×1 IMPLANT
SYR 50ML LL SCALE MARK (SYRINGE) ×1 IMPLANT
SYR TOOMEY 50ML (SYRINGE) IMPLANT
TISSUE GRAFT COLLECTOR (SYSTAGENIX WOUND MANAGEMENT) IMPLANT
TRAP FLUID SMOKE EVACUATOR (MISCELLANEOUS) ×1 IMPLANT
TUBE SET DOUBLEFLO INFLOW (TUBING) ×1 IMPLANT
WAND WEREWOLF FLOW 90D (MISCELLANEOUS) ×1 IMPLANT
WATER STERILE IRR 500ML POUR (IV SOLUTION) ×1 IMPLANT

## 2023-11-04 NOTE — Op Note (Signed)
11/04/2023  11:26 AM  Patient:   Lindsey George  Pre-Op Diagnosis:   Complex medial meniscus tear with early degenerative joint disease, right knee.  Postoperative diagnosis:   Same  Procedure:   Arthroscopic partial medial meniscectomy, extensive partial synovectomy, and reinjection of harvested infrapatellar fat cells, right knee.  Surgeon:   Maryagnes Amos, MD  Anesthesia:   General LMA  Findings:   As above.  There was a complex tear involving the posterior medial portion of the medial meniscus.  The lateral meniscus was in satisfactory condition, as were the anterior and posterior cruciate ligaments.  There were grade 2 chondromalacial changes involving the weightbearing portions of the medial and lateral femoral condyles as well as the central portion of the femoral trochlea.  The remaining areas of articular cartilage were in satisfactory condition.  Complications:   None  EBL:   5 cc.  Total fluids:   400 cc of crystalloid.  Tourniquet time:   None  Drains:   None  Closure:   4-0 Prolene interrupted sutures.  Brief clinical note:   The patient is a 72 year old female with a history of medial sided right knee pain.  Her symptoms have persisted despite medications, activity modification, etc.  Her history and examination are consistent with a medial meniscus tear confirmed by MRI scan.  The patient presents at this time for arthroscopy, debridement, partial medial meniscectomy, and reinjection of harvested fat cells for her right knee.  Procedure:   The patient was brought into the operating room and lain in the supine position. After adequate general laryngeal mask anesthesia was obtained, a timeout was performed to verify the appropriate side. The patient's right knee was injected sterilely using a solution of 30 cc of 1% lidocaine and 30 cc of 0.5% Sensorcaine with epinephrine. The right lower extremity was prepped with ChloraPrep solution before being draped sterilely.  Preoperative antibiotics were administered. The expected portal sites were injected with 0.5% Sensorcaine with epinephrine before the camera was placed in the anterolateral portal and instrumentation performed through the anteromedial portal.   The knee was sequentially examined beginning in the suprapatellar pouch, then progressing to the patellofemoral space, the medial gutter and compartment, the notch, and finally the lateral compartment and gutter. The findings were as described above. Abundant reactive synovial tissues anteriorly were debrided using the full-radius resector in order to improve visualization. These tissues were collected using the Arthrex graft net device and saved on the back table for later reimplantation.    The medial meniscus was carefully probed with the findings as described above. The unstable portions of the medial meniscus tear were debrided back to stable margins using a combination of the small baskets and full-radius resector. Subsequent probing of the remaining rim demonstrated excellent stability. Laterally, the meniscus was stable to probing. Hemostasis was achieved using the ArthroCare wand before the instruments were removed from the joint after suctioning the excess fluid.   The lateral portal site was closed using 4-0 Prolene interrupted sutures. The harvested fat cells were reintroduced into the joint through the medial portal via a red rubber catheter and Toomey syringe before the portal also was closed using 4-0 Prolene interrupted sutures. A sterile bulky dressing was applied to the knee. The patient was then awakened, extubated, and returned to the recovery room in satisfactory condition after tolerating the procedure well.

## 2023-11-04 NOTE — Anesthesia Procedure Notes (Signed)
Procedure Name: LMA Insertion Date/Time: 11/04/2023 10:28 AM  Performed by: Monico Hoar, CRNAPre-anesthesia Checklist: Patient identified, Patient being monitored, Timeout performed, Emergency Drugs available and Suction available Patient Re-evaluated:Patient Re-evaluated prior to induction Oxygen Delivery Method: Circle system utilized Preoxygenation: Pre-oxygenation with 100% oxygen Induction Type: IV induction Ventilation: Mask ventilation without difficulty LMA: LMA inserted LMA Size: 3.0 Tube type: Oral Number of attempts: 1 Placement Confirmation: positive ETCO2 and breath sounds checked- equal and bilateral Tube secured with: Tape Dental Injury: Teeth and Oropharynx as per pre-operative assessment

## 2023-11-04 NOTE — Discharge Instructions (Addendum)
Orthopedic discharge instructions: Keep dressing dry and intact.  May shower after dressing changed on post-op day #4 (Monday).  Cover sutures with Band-Aids after drying off. Apply ice frequently to knee. Take ibuprofen 600 mg TID with meals for 3-5 days, then as necessary. Take pain medication as prescribed or ES Tylenol when needed.  May weight-bear as tolerated - use crutches or walker as needed. Follow-up in 10-14 days or as scheduled.

## 2023-11-04 NOTE — Transfer of Care (Signed)
Immediate Anesthesia Transfer of Care Note  Patient: Lindsey George  Procedure(s) Performed: RIGHT KNEE ARTHROSCOPY WITH DEBRIDEMENT, PARTIAL MEDIAL MENISCECTOMY, AND REINJECTION OF HARVESTED FAT CELLS. (Right: Knee)  Patient Location: PACU  Anesthesia Type:General  Level of Consciousness: awake and alert   Airway & Oxygen Therapy: Patient Spontanous Breathing and Patient connected to face mask oxygen  Post-op Assessment: Report given to RN and Post -op Vital signs reviewed and stable  Post vital signs: Reviewed and stable  Last Vitals:  Vitals Value Taken Time  BP 141/61 11/04/23 1119  Temp    Pulse 78 11/04/23 1123  Resp 13 11/04/23 1123  SpO2 100 % 11/04/23 1123  Vitals shown include unfiled device data.  Last Pain:  Vitals:   11/04/23 0938  TempSrc: Tympanic  PainSc: 0-No pain         Complications: No notable events documented.

## 2023-11-04 NOTE — H&P (Signed)
History of Present Illness: Lindsey George is a 72 y.o.female who is being referred by Dedra Skeens, PA-C, for right knee pain. The symptoms began about 11 to 12 weeks ago and developed without any specific cause or injury. She presented to the urgent care clinic where x-rays were obtained and reportedly were negative for any acute bony process. She followed up with Dedra Skeens, PA-C, who gave her a steroid injection which she states provided little if any relief of her symptoms. Therefore, the patient was sent for an MRI scan and referred to me for further evaluation and treatment. She reports 4/10 pain. The pain is located along the medial aspect of the knee. The pain is described as aching, dull, and stabbing. The symptoms are aggravated with normal daily activities, using stairs, at higher levels of activity, walking, and standing. She also describes no mechanical symptoms. She has associated swelling and no deformity. She has tried acetaminophen, over-the-counter medications, anti-inflammatories, narcotic medications, steroid injections, and a home exercise program with temporary partial relief of her symptoms.  Current Outpatient Medications:  albuterol 90 mcg/actuation inhaler Inhale 1 inhalation into the lungs every 4 (four) hours as needed  alendronate (FOSAMAX) 70 MG tablet Take 70 mg by mouth every 7 (seven) days Take with a full glass of water. Do not lie down for the next 30 min.  aspirin 81 MG EC tablet Take 81 mg by mouth once daily.  baclofen (LIORESAL) 10 MG tablet  calcium carbonate-vitamin D3 (CALTRATE 600+D) 600 mg(1,500mg ) -200 unit tablet Take 1 tablet by mouth 2 (two) times daily with meals.  carbidopa-levodopa (SINEMET) 25-100 mg tablet Take 1 tablet by mouth 2 (two) times daily for 360 days 180 tablet 3  ciprofloxacin HCl (CIPRO) 100 MG tablet Take 100 mg by mouth 2 (two) times daily  DULoxetine (CYMBALTA) 30 MG DR capsule Reduce Duloxetine: Please take one 60 mg capsule by  mouth every morning + one 30 mg capsule by mouth every night for 2 weeks, then stop the 30 mg capsule/evening dose and continue the 60 mg by mouth every morning dose. (Patient taking differently: 60 mg once daily) 14 capsule 0  DULoxetine (CYMBALTA) 60 MG DR capsule Take 60 mg by mouth once daily  gabapentin (NEURONTIN) 300 MG capsule Take 1 capsule (300 mg total) by mouth 2 (two) times daily for 90 days 180 capsule 0  glipiZIDE (GLUCOTROL) 5 MG tablet Take 5 mg by mouth every morning before breakfast  ibuprofen (MOTRIN) 200 MG tablet Take by mouth  losartan (COZAAR) 25 MG tablet Take 25 mg by mouth once daily  metFORMIN (GLUCOPHAGE) 500 MG tablet Take 500 mg by mouth 2 (two) times daily with meals.  methylPREDNISolone (MEDROL DOSEPACK) 4 mg tablet  pioglitazone (ACTOS) 15 MG tablet Take 15 mg by mouth once daily  promethazine-dextromethorphan (PROMETHAZINE-DM) 6.25-15 mg/5 mL syrup Take by mouth  simvastatin (ZOCOR) 40 MG tablet Take 40 mg by mouth nightly.  traMADoL (ULTRAM) 50 mg tablet Take 1 tablet (50 mg total) by mouth every 6 (six) hours as needed for Pain for up to 30 doses 30 tablet 0  vit C-D3-E-FA-Zn-sel-lyc-L-car 250 mg-500 unit-200 unit Cmpk Take by mouth.   Allergies:  Codeine Unknown   Past Medical History:  Allergic rhinitis 01/19/2018  Asymptomatic varicose veins of both lower extremities 03/06/2020  Back pain 05/22/2015  Low back pain  Cervical radiculopathy 03/06/2020  Chronic bilateral low back pain with bilateral sciatica 08/27/2020  Diabetes mellitus (CMS/HHS-HCC) 04/12/2014  Essential hypertension 03/06/2020  Hx of adenomatous colonic polyps 01/14/2018  Hypercholesteremia 04/12/2014  Lumbar stenosis with neurogenic claudication 08/27/2020  Obesity due to excess calories 05/22/2015  Osteoporosis 04/12/2014  Parkinsons (CMS/HHS-HCC)  Reactive airway disease without complication (HHS-HCC) 01/12/2017  Type 2 diabetes mellitus without complication, without  long-term current use of insulin (CMS/HHS-HCC) 04/12/2014   Past Surgical History:  COLONOSCOPY 03/25/2018 (PH Adenomatous Polyps: CBF 03/2023)  Colon @ [facility] 04/20/2023 (Colonoscopy with inadequate prep. Repeat in 1 year if benefits outweigh risks/CTL)  COLONOSCOPY 04/20/2023 (PHx CP/Repeat 64yr/CTL)  COLONOSCOPY 01/04/2013, 06/158/2009, 04/07/2005 (Adenomatous Polyps: CBF 12/2017)  HYSTERECTOMY   Family History:  Stroke Father  Prostate cancer Father  Breast cancer Sister 54   Social History:   Socioeconomic History:  Marital status: Married  Tobacco Use  Smoking status: Never  Smokeless tobacco: Never  Vaping Use  Vaping status: Never Used  Substance and Sexual Activity  Alcohol use: No  Drug use: No  Sexual activity: Not Currently  Partners: Male  Birth control/protection: Post-menopausal   Social Drivers of Health:   Physicist, medical Strain: Low Risk (09/08/2023)  Overall Financial Resource Strain (CARDIA)  Difficulty of Paying Living Expenses: Not very hard  Food Insecurity: No Food Insecurity (09/08/2023)  Hunger Vital Sign  Worried About Running Out of Food in the Last Year: Never true  Ran Out of Food in the Last Year: Never true  Transportation Needs: No Transportation Needs (09/08/2023)  PRAPARE - Risk analyst (Medical): No  Lack of Transportation (Non-Medical): No   Review of Systems:  A comprehensive 14 point ROS was performed, reviewed, and the pertinent orthopaedic findings are documented in the HPI.  Physical Exam: Vitals:  10/13/23 1010  BP: 134/78  Weight: 78.7 kg (173 lb 6.4 oz)  Height: 157.5 cm (5\' 2" )  PainSc: 4  PainLoc: Knee   General/Constitutional: The patient appears to be well-nourished, well-developed, and in no acute distress. Neuro/Psych: Normal mood and affect, oriented to person, place and time. Eyes: Non-icteric. Pupils are equal, round, and reactive to light, and exhibit synchronous movement. Lymphatic:  No palpable adenopathy. Respiratory: Lungs clear to auscultation, Normal chest excursion, No wheezes, and Non-labored breathing Cardiovascular: Regular rate and rhythm. No murmurs. and No edema, swelling or tenderness, except as noted in detailed exam. Vascular: No edema, swelling or tenderness, except as noted in detailed exam. Integumentary: No impressive skin lesions present, except as noted in detailed exam. Musculoskeletal: Unremarkable, except as noted in detailed exam.  Right knee exam: GAIT: Mild limp, favoring her right leg, and uses no assistive devices. ALIGNMENT: normal SKIN: unremarkable SWELLING: minimal EFFUSION: trace WARMTH: no warmth TENDERNESS: moderate tenderness focally along posteromedial joint line, but no lateral joint line tenderness ROM: 0 to 135 degrees with pain in maximal flexion McMURRAY'S: positive PATELLOFEMORAL: normal tracking with no peri-patellar tenderness and negative apprehension sign CREPITUS: no LACHMAN'S: negative PIVOT SHIFT: Not evaluated ANTERIOR DRAWER: negative POSTERIOR DRAWER: negative VARUS/VALGUS: stable  She is grossly neurovascularly intact to the right lower extremity and foot.  Knee Imaging: AP weightbearing of both knees, as well as lateral and merchant views of the right knee are obtained. These films demonstrate mild degenerative changes, primarily involving the medial compartment with 10% medial joint space narrowing. The lateral and patellofemoral compartments appear to be well-maintained. Overall alignment is neutral. No fractures, lytic lesions, or abnormal calcifications are noted.  Assessment:  1. Primary osteoarthritis of right knee.  2. Complex tear of medial meniscus of right knee.   Plan: The treatment options  were discussed with the patient (and her daughter by phone). In addition, patient educational materials were provided regarding the diagnosis and treatment options. The patient is quite frustrated by her  symptoms and functional limitations, and is ready to consider more aggressive treatment options. Therefore, based on her MRI scan and physical examination findings, I have recommended a surgical procedure, specifically a right knee arthroscopy with debridement, partial medial meniscectomy, and possible reinjection of harvested infrapatellar fat cells. The procedure was discussed with the patient, as were the potential risks (including bleeding, infection, nerve and/or blood vessel injury, persistent or recurrent pain, failure of the repair, progression of arthritis, need for further surgery, blood clots, strokes, heart attacks and/or arhythmias, pneumonia, etc.) and benefits. The patient states her understanding and wishes to proceed. All of the patient's questions and concerns were answered. She can call any time with further concerns. She will follow up post-surgery, routine.    H&P reviewed and patient re-examined. No changes.

## 2023-11-04 NOTE — Anesthesia Postprocedure Evaluation (Signed)
Anesthesia Post Note  Patient: ROYANN GOTH  Procedure(s) Performed: RIGHT KNEE ARTHROSCOPY WITH DEBRIDEMENT, PARTIAL MEDIAL MENISCECTOMY, AND REINJECTION OF HARVESTED FAT CELLS. (Right: Knee)  Patient location during evaluation: PACU Anesthesia Type: General Level of consciousness: awake and alert Pain management: pain level controlled Vital Signs Assessment: post-procedure vital signs reviewed and stable Respiratory status: spontaneous breathing, nonlabored ventilation, respiratory function stable and patient connected to nasal cannula oxygen Cardiovascular status: blood pressure returned to baseline and stable Postop Assessment: no apparent nausea or vomiting Anesthetic complications: no   No notable events documented.   Last Vitals:  Vitals:   11/04/23 1220 11/04/23 1225  BP:    Pulse: 90 89  Resp: 20 16  Temp:    SpO2: 92% 90%    Last Pain:  Vitals:   11/04/23 1215  TempSrc:   PainSc: 0-No pain                 Cleda Mccreedy Jonessa Triplett

## 2023-11-04 NOTE — Anesthesia Preprocedure Evaluation (Signed)
Anesthesia Evaluation  Patient identified by MRN, date of birth, ID band Patient awake    Reviewed: Allergy & Precautions, NPO status , Patient's Chart, lab work & pertinent test results  History of Anesthesia Complications Negative for: history of anesthetic complications  Airway Mallampati: III  TM Distance: >3 FB Neck ROM: full    Dental  (+) Chipped, Poor Dentition, Missing   Pulmonary neg shortness of breath, asthma    Pulmonary exam normal        Cardiovascular Exercise Tolerance: Good hypertension, (-) angina (-) Past MI Normal cardiovascular exam     Neuro/Psych  Neuromuscular disease  negative psych ROS   GI/Hepatic negative GI ROS, Neg liver ROS,,,  Endo/Other  negative endocrine ROSdiabetes    Renal/GU      Musculoskeletal   Abdominal   Peds  Hematology negative hematology ROS (+)   Anesthesia Other Findings Past Medical History: No date: Asthma No date: Cancer (HCC)     Comment:  skin ca No date: Chronic back pain No date: Diabetes mellitus without complication (HCC) No date: Hyperlipidemia No date: Hypertension No date: Obesity No date: Osteoporosis  Past Surgical History: No date: ABDOMINAL HYSTERECTOMY 06/22/11: BREAST BIOPSY; Right     Comment:  bx/clip-neg No date: BREAST CYST ASPIRATION; Left     Comment:  neg 2014: COLONOSCOPY     Comment:  cleared for 5 yrs- [facility] doc 03/25/2018: COLONOSCOPY WITH PROPOFOL; N/A     Comment:  Procedure: COLONOSCOPY WITH PROPOFOL;  Surgeon: Scot Jun, MD;  Location: [facility] ENDOSCOPY;  Service:               Endoscopy;  Laterality: N/A; 04/20/2023: COLONOSCOPY WITH PROPOFOL; N/A     Comment:  Procedure: COLONOSCOPY WITH PROPOFOL;  Surgeon:               Regis Bill, MD;  Location: [facility] ENDOSCOPY;                Service: Endoscopy;  Laterality: N/A;  NEEDS AM; HAS A               GRADUATION AT 6:30 PM No date: PARTIAL  HYSTERECTOMY No date: SPLENECTOMY  BMI    Body Mass Index: 32.49 kg/m      Reproductive/Obstetrics negative OB ROS                             Anesthesia Physical Anesthesia Plan  ASA: 2  Anesthesia Plan: General LMA   Post-op Pain Management:    Induction: Intravenous  PONV Risk Score and Plan: Dexamethasone, Ondansetron, Midazolam and Treatment may vary due to age or medical condition  Airway Management Planned: LMA  Additional Equipment:   Intra-op Plan:   Post-operative Plan: Extubation in OR  Informed Consent: I have reviewed the patients History and Physical, chart, labs and discussed the procedure including the risks, benefits and alternatives for the proposed anesthesia with the patient or authorized representative who has indicated his/her understanding and acceptance.     Dental Advisory Given  Plan Discussed with: Anesthesiologist, CRNA and Surgeon  Anesthesia Plan Comments: (Patient consented for risks of anesthesia including but not limited to:  - adverse reactions to medications - damage to eyes, teeth, lips or other oral mucosa - nerve damage due to positioning  - sore throat or hoarseness - Damage to heart, brain, nerves,  lungs, other parts of body or loss of life  Patient voiced understanding and assent.)       Anesthesia Quick Evaluation

## 2023-11-05 ENCOUNTER — Encounter: Payer: Self-pay | Admitting: Surgery

## 2023-11-08 ENCOUNTER — Other Ambulatory Visit: Payer: Self-pay | Admitting: Family Medicine

## 2023-11-08 DIAGNOSIS — M810 Age-related osteoporosis without current pathological fracture: Secondary | ICD-10-CM

## 2023-11-11 ENCOUNTER — Ambulatory Visit: Payer: Medicare PPO | Admitting: Family Medicine

## 2023-11-26 ENCOUNTER — Other Ambulatory Visit: Payer: Self-pay | Admitting: Family Medicine

## 2023-11-26 DIAGNOSIS — M5412 Radiculopathy, cervical region: Secondary | ICD-10-CM

## 2023-12-07 DIAGNOSIS — H2513 Age-related nuclear cataract, bilateral: Secondary | ICD-10-CM | POA: Diagnosis not present

## 2023-12-07 DIAGNOSIS — E119 Type 2 diabetes mellitus without complications: Secondary | ICD-10-CM | POA: Diagnosis not present

## 2023-12-07 DIAGNOSIS — Z0100 Encounter for examination of eyes and vision without abnormal findings: Secondary | ICD-10-CM | POA: Diagnosis not present

## 2023-12-07 DIAGNOSIS — H43813 Vitreous degeneration, bilateral: Secondary | ICD-10-CM | POA: Diagnosis not present

## 2023-12-07 LAB — HM DIABETES EYE EXAM

## 2023-12-08 DIAGNOSIS — M25661 Stiffness of right knee, not elsewhere classified: Secondary | ICD-10-CM | POA: Diagnosis not present

## 2023-12-08 DIAGNOSIS — M25561 Pain in right knee: Secondary | ICD-10-CM | POA: Diagnosis not present

## 2023-12-08 DIAGNOSIS — M6281 Muscle weakness (generalized): Secondary | ICD-10-CM | POA: Diagnosis not present

## 2023-12-08 DIAGNOSIS — Z9889 Other specified postprocedural states: Secondary | ICD-10-CM | POA: Diagnosis not present

## 2023-12-13 DIAGNOSIS — Z9889 Other specified postprocedural states: Secondary | ICD-10-CM | POA: Diagnosis not present

## 2023-12-13 DIAGNOSIS — M25661 Stiffness of right knee, not elsewhere classified: Secondary | ICD-10-CM | POA: Diagnosis not present

## 2023-12-13 DIAGNOSIS — M25561 Pain in right knee: Secondary | ICD-10-CM | POA: Diagnosis not present

## 2023-12-13 DIAGNOSIS — M6281 Muscle weakness (generalized): Secondary | ICD-10-CM | POA: Diagnosis not present

## 2023-12-15 DIAGNOSIS — M6281 Muscle weakness (generalized): Secondary | ICD-10-CM | POA: Diagnosis not present

## 2023-12-15 DIAGNOSIS — M25561 Pain in right knee: Secondary | ICD-10-CM | POA: Diagnosis not present

## 2023-12-15 DIAGNOSIS — Z9889 Other specified postprocedural states: Secondary | ICD-10-CM | POA: Diagnosis not present

## 2023-12-15 DIAGNOSIS — M25661 Stiffness of right knee, not elsewhere classified: Secondary | ICD-10-CM | POA: Diagnosis not present

## 2023-12-20 ENCOUNTER — Ambulatory Visit: Payer: Medicare PPO | Admitting: Family Medicine

## 2023-12-20 ENCOUNTER — Encounter: Payer: Self-pay | Admitting: Family Medicine

## 2023-12-20 ENCOUNTER — Telehealth: Payer: Self-pay | Admitting: Neurosurgery

## 2023-12-20 VITALS — BP 118/70 | HR 86 | Ht 61.0 in | Wt 175.0 lb

## 2023-12-20 DIAGNOSIS — E119 Type 2 diabetes mellitus without complications: Secondary | ICD-10-CM

## 2023-12-20 DIAGNOSIS — I10 Essential (primary) hypertension: Secondary | ICD-10-CM

## 2023-12-20 DIAGNOSIS — Z9889 Other specified postprocedural states: Secondary | ICD-10-CM | POA: Diagnosis not present

## 2023-12-20 DIAGNOSIS — M25661 Stiffness of right knee, not elsewhere classified: Secondary | ICD-10-CM | POA: Diagnosis not present

## 2023-12-20 DIAGNOSIS — Z7984 Long term (current) use of oral hypoglycemic drugs: Secondary | ICD-10-CM

## 2023-12-20 DIAGNOSIS — Z23 Encounter for immunization: Secondary | ICD-10-CM | POA: Diagnosis not present

## 2023-12-20 DIAGNOSIS — E7800 Pure hypercholesterolemia, unspecified: Secondary | ICD-10-CM | POA: Diagnosis not present

## 2023-12-20 DIAGNOSIS — M6281 Muscle weakness (generalized): Secondary | ICD-10-CM | POA: Diagnosis not present

## 2023-12-20 DIAGNOSIS — M5412 Radiculopathy, cervical region: Secondary | ICD-10-CM | POA: Diagnosis not present

## 2023-12-20 DIAGNOSIS — M25561 Pain in right knee: Secondary | ICD-10-CM | POA: Diagnosis not present

## 2023-12-20 MED ORDER — GLIPIZIDE ER 5 MG PO TB24: 5.0000 mg | ORAL_TABLET | Freq: Every day | ORAL | 1 refills | Status: AC

## 2023-12-20 MED ORDER — LOSARTAN POTASSIUM 50 MG PO TABS: 50.0000 mg | ORAL_TABLET | Freq: Every day | ORAL | 1 refills | Status: AC

## 2023-12-20 MED ORDER — LOSARTAN POTASSIUM 50 MG PO TABS: 50.0000 mg | ORAL_TABLET | Freq: Every day | ORAL | 0 refills | Status: DC

## 2023-12-20 MED ORDER — PIOGLITAZONE HCL 15 MG PO TABS: 15.0000 mg | ORAL_TABLET | Freq: Every day | ORAL | 1 refills | Status: AC

## 2023-12-20 MED ORDER — METFORMIN HCL 500 MG PO TABS: ORAL_TABLET | ORAL | 1 refills | Status: AC

## 2023-12-20 MED ORDER — SIMVASTATIN 40 MG PO TABS: 40.0000 mg | ORAL_TABLET | Freq: Every day | ORAL | 1 refills | Status: AC

## 2023-12-20 NOTE — Patient Instructions (Signed)

## 2023-12-20 NOTE — Telephone Encounter (Signed)
Patient is calling if she should continue to take Duloxetine. She wants to stop it but will she have any side effects if she does? I told her that Dr. Yetta Barre was that last one to prescribe the medication so maybe she should ask her. Patient states Dr.Jones told her to ask Dr.Yarbrough.

## 2023-12-20 NOTE — Progress Notes (Signed)
Date:  12/20/2023   Name:  Lindsey George   DOB:  Oct 27, 1951   MRN:  [id number]   Chief Complaint: Diabetes, Hyperlipidemia, Hypertension, and cervical radiculopathy  Diabetes She presents for her follow-up diabetic visit. She has type 2 diabetes mellitus. Her disease course has been stable. There are no hypoglycemic associated symptoms. Pertinent negatives for hypoglycemia include no dizziness, headaches, nervousness/anxiousness or sweats. There are no diabetic associated symptoms. Pertinent negatives for diabetes include no blurred vision, no chest pain, no fatigue and no weakness. There are no hypoglycemic complications. Symptoms are stable. There are no diabetic complications. Pertinent negatives for diabetic complications include no CVA. Risk factors for coronary artery disease include dyslipidemia and hypertension. Current diabetic treatment includes oral agent (dual therapy). She is compliant with treatment some of the time. She is following a generally healthy diet. Meal planning includes avoidance of concentrated sweets and carbohydrate counting. She participates in exercise intermittently. There is no change in her home blood glucose trend.  Hyperlipidemia This is a chronic problem. The current episode started more than 1 year ago. The problem is controlled. Recent lipid tests were reviewed and are normal. Exacerbating diseases include diabetes and obesity. She has no history of chronic renal disease or liver disease. Pertinent negatives include no chest pain, focal sensory loss, focal weakness, leg pain, myalgias or shortness of breath. Current antihyperlipidemic treatment includes statins. The current treatment provides moderate improvement of lipids. There are no compliance problems.  Risk factors for coronary artery disease include diabetes mellitus and dyslipidemia.  Hypertension This is a chronic problem. The current episode started more than 1 year ago. The problem has been  gradually improving since onset. Pertinent negatives include no anxiety, blurred vision, chest pain, headaches, malaise/fatigue, neck pain, orthopnea, palpitations, peripheral edema, PND, shortness of breath or sweats. There are no associated agents to hypertension. There are no known risk factors for coronary artery disease. Past treatments include angiotensin blockers. The current treatment provides moderate improvement. There are no compliance problems.  There is no history of CAD/MI or CVA. There is no history of chronic renal disease, a hypertension causing med or renovascular disease.    Lab Results  Component Value Date   NA 136 11/01/2023   K 3.7 11/01/2023   CO2 28 11/01/2023   GLUCOSE 124 (H) 11/01/2023   BUN 17 11/01/2023   CREATININE 0.45 11/01/2023   CALCIUM 9.4 11/01/2023   EGFR 95 11/24/2022   GFRNONAA >60 11/01/2023   Lab Results  Component Value Date   CHOL 139 03/12/2023   HDL 57 03/12/2023   LDLCALC 60 03/12/2023   TRIG 122 03/12/2023   CHOLHDL 2.9 09/19/2018   No results found for: "TSH" Lab Results  Component Value Date   HGBA1C 7.0 (H) 07/12/2023   Lab Results  Component Value Date   WBC 13.7 (H) 09/13/2023   HGB 11.7 09/13/2023   HCT 36.7 09/13/2023   MCV 81 09/13/2023   PLT 440 09/13/2023   Lab Results  Component Value Date   ALT 15 11/24/2022   AST 12 11/24/2022   ALKPHOS 47 11/24/2022   BILITOT 0.4 11/24/2022   No results found for: "25OHVITD2", "25OHVITD3", "VD25OH"   Review of Systems  Constitutional: Negative.  Negative for chills, fatigue, fever, malaise/fatigue and unexpected weight change.  HENT:  Negative for congestion, ear discharge, ear pain, rhinorrhea, sinus pressure, sneezing and sore throat.   Eyes:  Negative for blurred vision.  Respiratory:  Negative for cough, shortness of  breath, wheezing and stridor.   Cardiovascular:  Negative for chest pain, palpitations, orthopnea and PND.  Gastrointestinal:  Negative for abdominal  pain, blood in stool, constipation, diarrhea and nausea.  Genitourinary:  Negative for dysuria, flank pain, frequency, hematuria, urgency and vaginal discharge.  Musculoskeletal:  Negative for arthralgias, back pain, myalgias and neck pain.  Skin:  Negative for rash.  Neurological:  Negative for dizziness, focal weakness, weakness and headaches.  Hematological:  Negative for adenopathy. Does not bruise/bleed easily.  Psychiatric/Behavioral:  Negative for dysphoric mood. The patient is not nervous/anxious.     Patient Active Problem List   Diagnosis Date Noted   Acute cough 11/04/2022   Reactive airway disease, mild intermittent, uncomplicated 07/16/2022   Change in bowel habits 12/10/2021   Pes anserinus bursitis of left knee 10/08/2021   DDD (degenerative disc disease), cervical 10/08/2021   Impingement syndrome of left shoulder 08/18/2021   Hand pain, left 06/26/2021   Loss of memory 06/26/2021   Tremor 06/26/2021   Lumbar stenosis with neurogenic claudication 08/27/2020   Chronic bilateral low back pain with bilateral sciatica 08/27/2020   Essential hypertension 03/06/2020   Cervical radiculopathy 03/06/2020   Asymptomatic varicose veins of both lower extremities 03/06/2020   Morbid obesity (HCC) 03/29/2019   Allergic rhinitis 01/19/2018   Hx of adenomatous colonic polyps 01/14/2018   Reactive airway disease without complication 01/12/2017   Back ache 05/22/2015   Alimentary obesity 05/22/2015   Type 2 diabetes mellitus without complication, without long-term current use of insulin (HCC) 04/12/2014   Hypercholesterolemia 04/12/2014   OP (osteoporosis) 04/12/2014    Allergies  Allergen Reactions   Codeine Other (See Comments)    Abdominal pain    Past Surgical History:  Procedure Laterality Date   ABDOMINAL HYSTERECTOMY     BREAST BIOPSY Right 06/22/11   bx/clip-neg   BREAST CYST ASPIRATION Left    neg   COLONOSCOPY  2014   cleared for 5 yrs- [facility] doc   COLONOSCOPY  WITH PROPOFOL N/A 03/25/2018   Procedure: COLONOSCOPY WITH PROPOFOL;  Surgeon: Scot Jun, MD;  Location[facility] ENDOSCOPY;  Service: Endoscopy;  Laterality: N/A;   COLONOSCOPY WITH PROPOFOL N/A 04/20/2023   Procedure: COLONOSCOPY WITH PROPOFOL;  Surgeon: Regis Bill, MD;  Location: [facility] ENDOSCOPY;  Service: Endoscopy;  Laterality: N/A;  NEEDS AM; HAS A GRADUATION AT 6:30 PM   KNEE ARTHROSCOPY Right 11/04/2023   Procedure: RIGHT KNEE ARTHROSCOPY WITH DEBRIDEMENT, PARTIAL MEDIAL MENISCECTOMY, AND REINJECTION OF HARVESTED FAT CELLS.;  Surgeon: Christena Flake, MD;  Location: [facility] ORS;  Service: Orthopedics;  Laterality: Right;   PARTIAL HYSTERECTOMY     SPLENECTOMY      Social History   Tobacco Use   Smoking status: Never   Smokeless tobacco: Never   Tobacco comments:    smoking cessation materials not required  Vaping Use   Vaping status: Never Used  Substance Use Topics   Alcohol use: Not Currently   Drug use: Never     Medication list has been reviewed and updated.  Current Meds  Medication Sig   alendronate (FOSAMAX) 70 MG tablet TAKE 1 TABLET BY MOUTH ONCE A WEEK WITH  A  FULL  GLASS  OF  WATER  ON  AN  EMPTY  STOMACH   Ascorbic Acid (VITAMIN C) 1000 MG tablet Take 1,000 mg by mouth daily. Chewable   aspirin 81 MG tablet Take 81 mg by mouth daily.   Calcium Carbonate-Vitamin D 600-10 MG-MCG TABS Take 2  tablets by mouth daily with breakfast.   carbidopa-levodopa (SINEMET IR) 25-100 MG tablet Take 1 tablet by mouth in the morning and at bedtime.   cyanocobalamin (VITAMIN B12) 1000 MCG tablet Take 1,000 mcg by mouth daily.   docusate sodium (COLACE) 50 MG capsule Take 1 capsule (50 mg total) by mouth daily as needed for mild constipation or moderate constipation.   DULoxetine (CYMBALTA) 30 MG capsule TAKE 2  BY MOUTH ONCE DAILY   EPIPEN 2-PAK 0.3 MG/0.3ML SOAJ injection Inject 0.3 mg into the muscle as needed for anaphylaxis.   gabapentin (NEURONTIN) 300 MG capsule Take  1 capsule (300 mg total) by mouth 2 (two) times daily.   glipiZIDE (GLIPIZIDE XL) 5 MG 24 hr tablet Take 1 tablet (5 mg total) by mouth daily.   HYDROcodone-acetaminophen (NORCO/VICODIN) 5-325 MG tablet Take 1-2 tablets by mouth every 6 (six) hours as needed for moderate pain (pain score 4-6) or severe pain (pain score 7-10).   losartan (COZAAR) 50 MG tablet Take 1 tablet by mouth once daily   metFORMIN (GLUCOPHAGE) 500 MG tablet TAKE ONE TABLET BY MOUTH TWICE DAILY   ONETOUCH ULTRA test strip USE 1 STRIP TO CHECK GLUCOSE ONCE DAILY   pioglitazone (ACTOS) 15 MG tablet Take 1 tablet (15 mg total) by mouth daily.   simvastatin (ZOCOR) 40 MG tablet Take 1 tablet (40 mg total) by mouth daily. (Patient taking differently: Take 40 mg by mouth at bedtime.)   Current Facility-Administered Medications for the 12/20/23 encounter (Office Visit) with Duanne Limerick, MD  Medication   ipratropium-albuterol (DUONEB) 0.5-2.5 (3) MG/3ML nebulizer solution 3 mL       08/20/2023   11:26 AM 07/20/2023   11:14 AM 07/12/2023    9:44 AM 03/12/2023   10:49 AM  GAD 7 : Generalized Anxiety Score  Nervous, Anxious, on Edge 0 0 0 0  Control/stop worrying 0 0 0 0  Worry too much - different things 0 0 0 0  Trouble relaxing 0 0 0 0  Restless 0 0 0 0  Easily annoyed or irritable 0 0 0 0  Afraid - awful might happen 0 0 0 0  Total GAD 7 Score 0 0 0 0  Anxiety Difficulty Not difficult at all Not difficult at all Not difficult at all Not difficult at all       08/20/2023   11:26 AM 07/21/2023    9:06 AM 07/21/2023    9:03 AM  Depression screen PHQ 2/9  Decreased Interest 0 0 0  Down, Depressed, Hopeless 0 0 0  PHQ - 2 Score 0 0 0  Altered sleeping 0 0 0  Tired, decreased energy 0 0 0  Change in appetite 0 0 0  Feeling bad or failure about yourself  0 0 0  Trouble concentrating 0 0 0  Moving slowly or fidgety/restless 0 0 0  Suicidal thoughts 0 0 0  PHQ-9 Score 0 0 0  Difficult doing work/chores Not difficult  at all Not difficult at all Not difficult at all    BP Readings from Last 3 Encounters:  12/20/23 118/70  11/04/23 (!) 102/91  09/13/23 (!) 140/82    Physical Exam Vitals and nursing note reviewed.  Constitutional:      General: She is not in acute distress.    Appearance: She is not diaphoretic.  HENT:     Head: Normocephalic and atraumatic.     Right Ear: Tympanic membrane and external ear normal.  Left Ear: Tympanic membrane and external ear normal.     Nose: Nose normal.     Mouth/Throat:     Mouth: Mucous membranes are moist.     Pharynx: No oropharyngeal exudate or posterior oropharyngeal erythema.  Eyes:     General:        Right eye: No discharge.        Left eye: No discharge.     Conjunctiva/sclera: Conjunctivae normal.     Pupils: Pupils are equal, round, and reactive to light.  Neck:     Thyroid: No thyromegaly.     Vascular: No JVD.  Cardiovascular:     Rate and Rhythm: Normal rate and regular rhythm.     Heart sounds: Normal heart sounds. No murmur heard.    No friction rub. No gallop.  Pulmonary:     Effort: Pulmonary effort is normal.     Breath sounds: Normal breath sounds. No wheezing, rhonchi or rales.  Abdominal:     General: Bowel sounds are normal.     Palpations: Abdomen is soft. There is no mass.     Tenderness: There is no abdominal tenderness. There is no guarding or rebound.  Musculoskeletal:        General: Normal range of motion.     Cervical back: Normal range of motion and neck supple.  Lymphadenopathy:     Cervical: No cervical adenopathy.  Skin:    General: Skin is warm and dry.  Neurological:     Mental Status: She is alert.     Deep Tendon Reflexes: Reflexes are normal and symmetric.     Wt Readings from Last 3 Encounters:  12/20/23 175 lb (79.4 kg)  11/04/23 171 lb 15.3 oz (78 kg)  10/27/23 172 lb (78 kg)    BP 118/70   Pulse 86   Ht 5\' 1"  (1.549 m)   Wt 175 lb (79.4 kg)   SpO2 95%   BMI 33.07 kg/m    Assessment and Plan: 1. Essential hypertension (Primary) Chronic.  Controlled.  Stable.  Blood pressure 118/70.  Asymptomatic.  Tolerating medications well.  Will continue losartan 50 mg once a day.  And will recheck in 6 months. - Comprehensive metabolic panel - losartan (COZAAR) 50 MG tablet; Take 1 tablet (50 mg total) by mouth daily.  Dispense: 90 tablet; Refill: 0  2. Type 2 diabetes mellitus without complication, without long-term current use of insulin (HCC) Chronic.  Controlled.  Stable.  Asymptomatic without polyuria polydipsia.  Continue glipizide XL 5 mg once a day, metformin 500 mg twice a day and pioglitazone 15 mg once a day.  Will check microalbuminuria for screening for diabetic nephropathy.  Foot exam was done and it was in normal range. - Hemoglobin A1c - glipiZIDE (GLIPIZIDE XL) 5 MG 24 hr tablet; Take 1 tablet (5 mg total) by mouth daily.  Dispense: 90 tablet; Refill: 1 - metFORMIN (GLUCOPHAGE) 500 MG tablet; TAKE ONE TABLET BY MOUTH TWICE DAILY  Dispense: 180 tablet; Refill: 1 - pioglitazone (ACTOS) 15 MG tablet; Take 1 tablet (15 mg total) by mouth daily.  Dispense: 90 tablet; Refill: 1 - Microalbumin / creatinine urine ratio  3. Hypercholesterolemia Chronic.  Controlled.  Stable.  Asymptomatic.  Without myalgias or muscle weakness.  Continue simvastatin 40 mg once a day.  Will continue with dietary approach as well and patient has been given low-cholesterol low triglyceride dietary guidelines - simvastatin (ZOCOR) 40 MG tablet; Take 1 tablet (40 mg total) by mouth daily.  Dispense: 90 tablet; Refill: 1 - Lipid panel  4. Cervical radiculopathy Chronic.  Patient is currently seeing Dr. Marcell Barlow and pathology.  Patient is on duloxetine and she will likely continue this per Dr. Marcell Barlow if this is necessary.  5. Need for COVID-19 vaccine Discussed and administered. 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

## 2023-12-21 ENCOUNTER — Telehealth: Payer: Self-pay | Admitting: Family Medicine

## 2023-12-21 ENCOUNTER — Ambulatory Visit: Payer: Self-pay

## 2023-12-21 LAB — MICROALBUMIN / CREATININE URINE RATIO
Creatinine, Urine: 133.2 mg/dL
Microalb/Creat Ratio: 185 mg/g{creat} — ABNORMAL HIGH (ref 0–29)
Microalbumin, Urine: 247 ug/mL

## 2023-12-21 LAB — LIPID PANEL
Chol/HDL Ratio: 2.5 {ratio} (ref 0.0–4.4)
Cholesterol, Total: 142 mg/dL (ref 100–199)
HDL: 57 mg/dL (ref >39)
LDL Chol Calc (NIH): 69 mg/dL (ref 0–99)
Triglycerides: 81 mg/dL (ref 0–149)
VLDL Cholesterol Cal: 16 mg/dL (ref 5–40)

## 2023-12-21 NOTE — Telephone Encounter (Signed)
Copied from CRM [id number]. Topic: General - Other >> Dec 21, 2023  3:28 PM Santiya F wrote: Reason for CRM: Pt is calling in returning a call from the office

## 2023-12-21 NOTE — Telephone Encounter (Signed)
  Chief Complaint: medication assistance  Symptoms: NA Frequency:  Pertinent Negatives: NA Disposition: [] ED /[] Urgent Care (no appt availability in office) / [] Appointment(In office/virtual)/ []  [place] [facility]/ [] Home Care/ [] Refused Recommended Disposition /[] [place] [facility]/ [x]  Follow-up with PCP Additional Notes: Pt wanting to stop duloxetine, Dr. Myer Haff gave her the ok to dc so pt would like to know dc plan. Pt states she takes 2 tabs once daily. Advised I couldn't recall taper plan thought it was 1 every day for several days then decrease to 1 EOD but would send to Dr. Yetta Barre for recommendations and have nurse FU with her. Pt verbalized understanding.   Summary: Questions for nurse, med taper   Pt has questions about discontinuing duloxetine, says her PCP discussed coming off of it with her during their recent visit. Pt has questions for a nurse about a taper plan to discontinue.  Best contact: [phone]      Reason for Disposition  [1] Caller has NON-URGENT medicine question about med that PCP prescribed AND [2] triager unable to answer question  Answer Assessment - Initial Assessment Questions 1. NAME of MEDICINE: "What medicine(s) are you calling about?"     Duloxetine  2. QUESTION: "What is your question?" (e.g., double dose of medicine, side effect)     Wanting to stop medication but needing to know how to go about it  3. PRESCRIBER: "Who prescribed the medicine?" Reason: if prescribed by specialist, call should be referred to that group.     Dr. Yetta Barre  Protocols used: Medication Question Call-A-AH

## 2023-12-21 NOTE — Telephone Encounter (Signed)
Patient informed per Dr Yetta Barre take 1 daily for 10 days and then stop completely. She verbalized understanding.  - Lindsey George

## 2023-12-21 NOTE — Telephone Encounter (Signed)
I notified the patient of Dr Lucienne Capers response and advised her to discussion how to stop the medication with Dr Yetta Barre. She verbalized understanding.

## 2023-12-21 NOTE — Telephone Encounter (Signed)
Message from [place] E sent at 12/21/2023  9:44 AM EST  Summary: Questions for nurse, med taper   Pt has questions about discontinuing duloxetine, says her PCP discussed coming off of it with her during their recent visit. Pt has questions for a nurse about a taper plan to discontinue.  Best contact: [phone]        Called pt and LM on VM to CB.

## 2023-12-27 ENCOUNTER — Encounter: Payer: Self-pay | Admitting: Family Medicine

## 2023-12-27 DIAGNOSIS — M6281 Muscle weakness (generalized): Secondary | ICD-10-CM | POA: Diagnosis not present

## 2023-12-27 DIAGNOSIS — Z9889 Other specified postprocedural states: Secondary | ICD-10-CM | POA: Diagnosis not present

## 2023-12-27 DIAGNOSIS — M25561 Pain in right knee: Secondary | ICD-10-CM | POA: Diagnosis not present

## 2023-12-27 DIAGNOSIS — M25661 Stiffness of right knee, not elsewhere classified: Secondary | ICD-10-CM | POA: Diagnosis not present

## 2023-12-29 ENCOUNTER — Other Ambulatory Visit: Payer: Self-pay

## 2023-12-29 DIAGNOSIS — E119 Type 2 diabetes mellitus without complications: Secondary | ICD-10-CM | POA: Diagnosis not present

## 2023-12-29 DIAGNOSIS — Z9889 Other specified postprocedural states: Secondary | ICD-10-CM | POA: Diagnosis not present

## 2023-12-29 DIAGNOSIS — M25661 Stiffness of right knee, not elsewhere classified: Secondary | ICD-10-CM | POA: Diagnosis not present

## 2023-12-29 DIAGNOSIS — M6281 Muscle weakness (generalized): Secondary | ICD-10-CM | POA: Diagnosis not present

## 2023-12-29 DIAGNOSIS — M25561 Pain in right knee: Secondary | ICD-10-CM | POA: Diagnosis not present

## 2023-12-30 ENCOUNTER — Encounter: Payer: Self-pay | Admitting: Family Medicine

## 2023-12-30 LAB — COMPREHENSIVE METABOLIC PANEL
ALT: 11 [IU]/L (ref 0–32)
AST: 11 [IU]/L (ref 0–40)
Albumin: 4.7 g/dL (ref 3.8–4.8)
Alkaline Phosphatase: 57 [IU]/L (ref 44–121)
BUN/Creatinine Ratio: 27 (ref 12–28)
BUN: 14 mg/dL (ref 8–27)
Bilirubin Total: 0.3 mg/dL (ref 0.0–1.2)
CO2: 23 mmol/L (ref 20–29)
Calcium: 10.2 mg/dL (ref 8.7–10.3)
Chloride: 101 mmol/L (ref 96–106)
Creatinine, Ser: 0.51 mg/dL — ABNORMAL LOW (ref 0.57–1.00)
Globulin, Total: 2.4 g/dL (ref 1.5–4.5)
Glucose: 120 mg/dL — ABNORMAL HIGH (ref 70–99)
Potassium: 4.6 mmol/L (ref 3.5–5.2)
Sodium: 141 mmol/L (ref 134–144)
Total Protein: 7.1 g/dL (ref 6.0–8.5)
eGFR: 98 mL/min/{1.73_m2} (ref >59)

## 2023-12-30 LAB — HEMOGLOBIN A1C
Est. average glucose Bld gHb Est-mCnc: 148 mg/dL
Hgb A1c MFr Bld: 6.8 % — ABNORMAL HIGH (ref 4.8–5.6)

## 2024-01-05 DIAGNOSIS — M25661 Stiffness of right knee, not elsewhere classified: Secondary | ICD-10-CM | POA: Diagnosis not present

## 2024-01-05 DIAGNOSIS — M25561 Pain in right knee: Secondary | ICD-10-CM | POA: Diagnosis not present

## 2024-01-05 DIAGNOSIS — M6281 Muscle weakness (generalized): Secondary | ICD-10-CM | POA: Diagnosis not present

## 2024-01-05 DIAGNOSIS — Z9889 Other specified postprocedural states: Secondary | ICD-10-CM | POA: Diagnosis not present

## 2024-01-07 DIAGNOSIS — G20A1 Parkinson's disease without dyskinesia, without mention of fluctuations: Secondary | ICD-10-CM | POA: Diagnosis not present

## 2024-01-07 DIAGNOSIS — E7800 Pure hypercholesterolemia, unspecified: Secondary | ICD-10-CM | POA: Diagnosis not present

## 2024-01-07 DIAGNOSIS — Z0000 Encounter for general adult medical examination without abnormal findings: Secondary | ICD-10-CM | POA: Diagnosis not present

## 2024-01-07 DIAGNOSIS — E119 Type 2 diabetes mellitus without complications: Secondary | ICD-10-CM | POA: Diagnosis not present

## 2024-01-07 DIAGNOSIS — I10 Essential (primary) hypertension: Secondary | ICD-10-CM | POA: Diagnosis not present

## 2024-01-10 DIAGNOSIS — M6281 Muscle weakness (generalized): Secondary | ICD-10-CM | POA: Diagnosis not present

## 2024-01-10 DIAGNOSIS — Z9889 Other specified postprocedural states: Secondary | ICD-10-CM | POA: Diagnosis not present

## 2024-01-10 DIAGNOSIS — M25561 Pain in right knee: Secondary | ICD-10-CM | POA: Diagnosis not present

## 2024-01-10 DIAGNOSIS — M25661 Stiffness of right knee, not elsewhere classified: Secondary | ICD-10-CM | POA: Diagnosis not present

## 2024-01-12 DIAGNOSIS — M25661 Stiffness of right knee, not elsewhere classified: Secondary | ICD-10-CM | POA: Diagnosis not present

## 2024-01-12 DIAGNOSIS — Z9889 Other specified postprocedural states: Secondary | ICD-10-CM | POA: Diagnosis not present

## 2024-01-12 DIAGNOSIS — M6281 Muscle weakness (generalized): Secondary | ICD-10-CM | POA: Diagnosis not present

## 2024-01-12 DIAGNOSIS — M25561 Pain in right knee: Secondary | ICD-10-CM | POA: Diagnosis not present

## 2024-01-17 DIAGNOSIS — M25561 Pain in right knee: Secondary | ICD-10-CM | POA: Diagnosis not present

## 2024-01-17 DIAGNOSIS — M25661 Stiffness of right knee, not elsewhere classified: Secondary | ICD-10-CM | POA: Diagnosis not present

## 2024-01-17 DIAGNOSIS — Z9889 Other specified postprocedural states: Secondary | ICD-10-CM | POA: Diagnosis not present

## 2024-01-17 DIAGNOSIS — M6281 Muscle weakness (generalized): Secondary | ICD-10-CM | POA: Diagnosis not present

## 2024-01-19 ENCOUNTER — Other Ambulatory Visit: Payer: Self-pay

## 2024-01-24 DIAGNOSIS — G20A1 Parkinson's disease without dyskinesia, without mention of fluctuations: Secondary | ICD-10-CM | POA: Diagnosis not present

## 2024-01-24 DIAGNOSIS — M25561 Pain in right knee: Secondary | ICD-10-CM | POA: Diagnosis not present

## 2024-01-24 DIAGNOSIS — M25661 Stiffness of right knee, not elsewhere classified: Secondary | ICD-10-CM | POA: Diagnosis not present

## 2024-01-24 DIAGNOSIS — G629 Polyneuropathy, unspecified: Secondary | ICD-10-CM | POA: Diagnosis not present

## 2024-01-24 DIAGNOSIS — Z9889 Other specified postprocedural states: Secondary | ICD-10-CM | POA: Diagnosis not present

## 2024-01-24 DIAGNOSIS — G479 Sleep disorder, unspecified: Secondary | ICD-10-CM | POA: Diagnosis not present

## 2024-01-24 DIAGNOSIS — M6281 Muscle weakness (generalized): Secondary | ICD-10-CM | POA: Diagnosis not present

## 2024-01-25 DIAGNOSIS — Z860101 Personal history of adenomatous and serrated colon polyps: Secondary | ICD-10-CM | POA: Diagnosis not present

## 2024-01-25 DIAGNOSIS — K5900 Constipation, unspecified: Secondary | ICD-10-CM | POA: Diagnosis not present

## 2024-01-28 DIAGNOSIS — R399 Unspecified symptoms and signs involving the genitourinary system: Secondary | ICD-10-CM | POA: Diagnosis not present

## 2024-01-28 DIAGNOSIS — R350 Frequency of micturition: Secondary | ICD-10-CM | POA: Diagnosis not present

## 2024-01-29 ENCOUNTER — Other Ambulatory Visit: Payer: Self-pay | Admitting: Family Medicine

## 2024-01-29 DIAGNOSIS — M810 Age-related osteoporosis without current pathological fracture: Secondary | ICD-10-CM

## 2024-01-31 ENCOUNTER — Other Ambulatory Visit: Payer: Self-pay

## 2024-01-31 ENCOUNTER — Inpatient Hospital Stay
Admission: EM | Admit: 2024-01-31 | Discharge: 2024-02-02 | DRG: 872 | Disposition: A | Discharge status: Home / Self Care | Attending: Internal Medicine | Admitting: Internal Medicine

## 2024-01-31 ENCOUNTER — Emergency Department

## 2024-01-31 DIAGNOSIS — Z7982 Long term (current) use of aspirin: Secondary | ICD-10-CM

## 2024-01-31 DIAGNOSIS — Z885 Allergy status to narcotic agent status: Secondary | ICD-10-CM

## 2024-01-31 DIAGNOSIS — A419 Sepsis, unspecified organism: Principal | ICD-10-CM | POA: Diagnosis present

## 2024-01-31 DIAGNOSIS — R55 Syncope and collapse: Secondary | ICD-10-CM | POA: Diagnosis present

## 2024-01-31 DIAGNOSIS — I10 Essential (primary) hypertension: Secondary | ICD-10-CM | POA: Diagnosis present

## 2024-01-31 DIAGNOSIS — Z7984 Long term (current) use of oral hypoglycemic drugs: Secondary | ICD-10-CM

## 2024-01-31 DIAGNOSIS — J4520 Mild intermittent asthma, uncomplicated: Secondary | ICD-10-CM | POA: Diagnosis present

## 2024-01-31 DIAGNOSIS — R9431 Abnormal electrocardiogram [ECG] [EKG]: Secondary | ICD-10-CM | POA: Diagnosis present

## 2024-01-31 DIAGNOSIS — M810 Age-related osteoporosis without current pathological fracture: Secondary | ICD-10-CM | POA: Diagnosis present

## 2024-01-31 DIAGNOSIS — M5441 Lumbago with sciatica, right side: Secondary | ICD-10-CM | POA: Diagnosis present

## 2024-01-31 DIAGNOSIS — N10 Acute tubulo-interstitial nephritis: Secondary | ICD-10-CM | POA: Diagnosis present

## 2024-01-31 DIAGNOSIS — E669 Obesity, unspecified: Secondary | ICD-10-CM | POA: Diagnosis present

## 2024-01-31 DIAGNOSIS — I959 Hypotension, unspecified: Secondary | ICD-10-CM | POA: Diagnosis present

## 2024-01-31 DIAGNOSIS — Z803 Family history of malignant neoplasm of breast: Secondary | ICD-10-CM

## 2024-01-31 DIAGNOSIS — M549 Dorsalgia, unspecified: Secondary | ICD-10-CM | POA: Diagnosis present

## 2024-01-31 DIAGNOSIS — R42 Dizziness and giddiness: Secondary | ICD-10-CM | POA: Diagnosis not present

## 2024-01-31 DIAGNOSIS — G20A1 Parkinson's disease without dyskinesia, without mention of fluctuations: Secondary | ICD-10-CM | POA: Diagnosis present

## 2024-01-31 DIAGNOSIS — Z8249 Family history of ischemic heart disease and other diseases of the circulatory system: Secondary | ICD-10-CM

## 2024-01-31 DIAGNOSIS — R918 Other nonspecific abnormal finding of lung field: Secondary | ICD-10-CM | POA: Diagnosis present

## 2024-01-31 DIAGNOSIS — Z90711 Acquired absence of uterus with remaining cervical stump: Secondary | ICD-10-CM

## 2024-01-31 DIAGNOSIS — R519 Headache, unspecified: Secondary | ICD-10-CM | POA: Diagnosis present

## 2024-01-31 DIAGNOSIS — G8929 Other chronic pain: Secondary | ICD-10-CM | POA: Diagnosis present

## 2024-01-31 DIAGNOSIS — R110 Nausea: Secondary | ICD-10-CM | POA: Diagnosis present

## 2024-01-31 DIAGNOSIS — E785 Hyperlipidemia, unspecified: Secondary | ICD-10-CM | POA: Diagnosis present

## 2024-01-31 DIAGNOSIS — Z823 Family history of stroke: Secondary | ICD-10-CM

## 2024-01-31 DIAGNOSIS — M5442 Lumbago with sciatica, left side: Secondary | ICD-10-CM | POA: Diagnosis present

## 2024-01-31 DIAGNOSIS — M5412 Radiculopathy, cervical region: Secondary | ICD-10-CM | POA: Diagnosis present

## 2024-01-31 DIAGNOSIS — Z9081 Acquired absence of spleen: Secondary | ICD-10-CM

## 2024-01-31 DIAGNOSIS — Z79899 Other long term (current) drug therapy: Secondary | ICD-10-CM

## 2024-01-31 DIAGNOSIS — N12 Tubulo-interstitial nephritis, not specified as acute or chronic: Secondary | ICD-10-CM | POA: Diagnosis present

## 2024-01-31 DIAGNOSIS — Z7983 Long term (current) use of bisphosphonates: Secondary | ICD-10-CM

## 2024-01-31 DIAGNOSIS — E119 Type 2 diabetes mellitus without complications: Secondary | ICD-10-CM | POA: Diagnosis present

## 2024-01-31 LAB — CBC WITH DIFFERENTIAL/PLATELET
Abs Immature Granulocytes: 0.05 10*3/uL (ref 0.00–0.07)
Basophils Absolute: 0.1 10*3/uL (ref 0.0–0.1)
Basophils Relative: 0 %
Eosinophils Absolute: 0 10*3/uL (ref 0.0–0.5)
Eosinophils Relative: 0 %
HCT: 32.6 % — ABNORMAL LOW (ref 36.0–46.0)
Hemoglobin: 10.4 g/dL — ABNORMAL LOW (ref 12.0–15.0)
Immature Granulocytes: 0 %
Lymphocytes Relative: 14 %
Lymphs Abs: 2.2 10*3/uL (ref 0.7–4.0)
MCH: 26.3 pg (ref 26.0–34.0)
MCHC: 31.9 g/dL (ref 30.0–36.0)
MCV: 82.5 fL (ref 80.0–100.0)
Monocytes Absolute: 1.8 10*3/uL — ABNORMAL HIGH (ref 0.1–1.0)
Monocytes Relative: 12 %
Neutro Abs: 11.5 10*3/uL — ABNORMAL HIGH (ref 1.7–7.7)
Neutrophils Relative %: 74 %
Platelets: 342 10*3/uL (ref 150–400)
RBC: 3.95 MIL/uL (ref 3.87–5.11)
RDW: 13.3 % (ref 11.5–15.5)
WBC: 15.6 10*3/uL — ABNORMAL HIGH (ref 4.0–10.5)
nRBC: 0 % (ref 0.0–0.2)

## 2024-01-31 LAB — URINALYSIS, W/ REFLEX TO CULTURE (INFECTION SUSPECTED)
Bilirubin Urine: NEGATIVE
Glucose, UA: NEGATIVE mg/dL
Hgb urine dipstick: NEGATIVE
Ketones, ur: NEGATIVE mg/dL
Nitrite: NEGATIVE
Protein, ur: 30 mg/dL — AB
Specific Gravity, Urine: 1.006 (ref 1.005–1.030)
WBC, UA: >50 WBC/hpf (ref 0–5)
pH: 6 (ref 5.0–8.0)

## 2024-01-31 LAB — COMPREHENSIVE METABOLIC PANEL
ALT: <5 U/L (ref 0–44)
AST: 13 U/L — ABNORMAL LOW (ref 15–41)
Albumin: 3.5 g/dL (ref 3.5–5.0)
Alkaline Phosphatase: 47 U/L (ref 38–126)
Anion gap: 10 (ref 5–15)
BUN: 15 mg/dL (ref 8–23)
CO2: 26 mmol/L (ref 22–32)
Calcium: 9 mg/dL (ref 8.9–10.3)
Chloride: 96 mmol/L — ABNORMAL LOW (ref 98–111)
Creatinine, Ser: 0.86 mg/dL (ref 0.44–1.00)
GFR, Estimated: >60 mL/min (ref >60)
Glucose, Bld: 165 mg/dL — ABNORMAL HIGH (ref 70–99)
Potassium: 3.5 mmol/L (ref 3.5–5.1)
Sodium: 132 mmol/L — ABNORMAL LOW (ref 135–145)
Total Bilirubin: 0.8 mg/dL (ref 0.0–1.2)
Total Protein: 6.8 g/dL (ref 6.5–8.1)

## 2024-01-31 LAB — LIPASE, BLOOD: Lipase: 27 U/L (ref 11–51)

## 2024-01-31 LAB — TROPONIN I (HIGH SENSITIVITY)
Troponin I (High Sensitivity): 5 ng/L (ref <18)
Troponin I (High Sensitivity): 5 ng/L (ref <18)

## 2024-01-31 LAB — LACTIC ACID, PLASMA
Lactic Acid, Venous: 1.5 mmol/L (ref 0.5–1.9)
Lactic Acid, Venous: 2 mmol/L (ref 0.5–1.9)
Lactic Acid, Venous: 1.2 mmol/L (ref 0.5–1.9)

## 2024-01-31 LAB — MAGNESIUM: Magnesium: 2.1 mg/dL (ref 1.7–2.4)

## 2024-01-31 MED ORDER — SODIUM CHLORIDE 0.9 % IV BOLUS
1000.0000 mL | Freq: Once | INTRAVENOUS | Status: AC
  Administered 2024-01-31: 1000 mL via INTRAVENOUS

## 2024-01-31 MED ORDER — VANCOMYCIN HCL IN DEXTROSE 1-5 GM/200ML-% IV SOLN
1000.0000 mg | Freq: Once | INTRAVENOUS | Status: AC
  Administered 2024-01-31: 1000 mg via INTRAVENOUS
  Filled 2024-01-31: qty 200

## 2024-01-31 MED ORDER — SODIUM CHLORIDE 0.9 % IV SOLN
2.0000 g | Freq: Once | INTRAVENOUS | Status: AC
  Administered 2024-01-31: 2 g via INTRAVENOUS
  Filled 2024-01-31: qty 12.5

## 2024-01-31 MED ORDER — LACTATED RINGERS IV SOLN: INTRAVENOUS | Status: AC

## 2024-01-31 MED ORDER — MAGNESIUM SULFATE IN D5W 1-5 GM/100ML-% IV SOLN
1.0000 g | Freq: Once | INTRAVENOUS | Status: AC
  Administered 2024-01-31: 1 g via INTRAVENOUS
  Filled 2024-01-31: qty 100

## 2024-01-31 MED ORDER — IOHEXOL 300 MG/ML  SOLN
100.0000 mL | Freq: Once | INTRAMUSCULAR | Status: AC | PRN
  Administered 2024-01-31: 100 mL via INTRAVENOUS

## 2024-01-31 MED ORDER — METRONIDAZOLE 500 MG/100ML IV SOLN
500.0000 mg | Freq: Once | INTRAVENOUS | Status: AC
  Administered 2024-01-31: 500 mg via INTRAVENOUS
  Filled 2024-01-31: qty 100

## 2024-01-31 NOTE — Consult Note (Signed)
 CODE SEPSIS - PHARMACY COMMUNICATION  **Broad Spectrum Antibiotics should be administered within 1 hour of Sepsis diagnosis**  Time Code Sepsis Called/Page Received: 1709  Antibiotics Ordered: cefepime, metronidazole, and Vancomycin  Time of 1st antibiotic administration: 1741  Additional action taken by pharmacy: none  If necessary, Name of Provider/Nurse Contacted: n/a    Lindsey George PharmD, BCPS 01/31/2024 5:12 PM

## 2024-01-31 NOTE — ED Triage Notes (Signed)
 Pt [place] via [facility] from [place]. Pt started feeling dizzy and went to sit down. First BP for EMS was 66/30 and then after trendelenburg it was 88/50.   EMS vitals: HR is the 70s 94% on RA

## 2024-01-31 NOTE — Sepsis Progress Note (Signed)
 Elink monitoring for the code sepsis protocol.

## 2024-01-31 NOTE — ED Provider Notes (Signed)
 [facility] Provider Note    Event Date/Time   First MD Initiated Contact with Patient 01/31/24 1614     (approximate)   History   Dizziness   HPI  Lindsey George is a 73 y.o. female past medical history significant for hypertension who presents to the emergency department with dizziness.  States that she was in her normal state of health today and feeling fine, went to the store to buy some cards for birthdays.  States that when she was looking at card she suddenly felt very dizzy and like she was going to pass out.  Felt like she was nauseous and needed to throw up.  Felt like she may be needed to have a bowel movement.  Then was helped to lowered into her chair.  When EMS arrived patient's blood pressure was significantly low with a blood pressures of 60/30.  Received a 500 bolus and improved to 80 systolic.  On arrival it was 100 systolic.  Patient denies any dizziness while laying down.  States that she is feeling somewhat better.  Denies any ongoing nausea, vomiting, abdominal pain.  Denies dysuria, urinary urgency or frequency.  Does state that she ate breakfast with a hot dog today.  Denies any chest pain or shortness of breath.  Denies any new changes to her home medications.  Denies any extremity numbness or weakness.     Physical Exam   Triage Vital Signs: ED Triage Vitals  Encounter Vitals Group     BP 01/31/24 1620 (!) 102/49     Systolic BP Percentile --      Diastolic BP Percentile --      Pulse Rate 01/31/24 1620 69     Resp 01/31/24 1620 (!) 25     Temp 01/31/24 1620 97.9 F (36.6 C)     Temp Source 01/31/24 1620 Oral     SpO2 01/31/24 1620 95 %     Weight --      Height --      Head Circumference --      Peak Flow --      Pain Score 01/31/24 1621 0     Pain Loc --      Pain Education --      Exclude from Growth Chart --     Most recent vital signs: Vitals:   01/31/24 2314 01/31/24 2316  BP: 121/61 109/74  Pulse: 88 84   Resp: (!) 30 (!) 24  Temp:    SpO2: 100% 99%    Physical Exam Constitutional:      Appearance: She is well-developed.  HENT:     Head: Atraumatic.     Mouth/Throat:     Mouth: Mucous membranes are moist.  Eyes:     Extraocular Movements: Extraocular movements intact.     Conjunctiva/sclera: Conjunctivae normal.     Pupils: Pupils are equal, round, and reactive to light.  Cardiovascular:     Rate and Rhythm: Regular rhythm.     Pulses: Normal pulses.  Pulmonary:     Effort: No respiratory distress.  Abdominal:     General: There is no distension.     Tenderness: There is no abdominal tenderness.  Musculoskeletal:        General: Normal range of motion.     Cervical back: Normal range of motion.  Skin:    General: Skin is warm.     Capillary Refill: Capillary refill takes less than 2 seconds.  Neurological:  Mental Status: She is alert. Mental status is at baseline.     GCS: GCS eye subscore is 4. GCS verbal subscore is 5. GCS motor subscore is 6.     Cranial Nerves: Cranial nerves 2-12 are intact.     Sensory: Sensation is intact.     Motor: Motor function is intact.     Coordination: Coordination is intact.     IMPRESSION / MDM / ASSESSMENT AND PLAN / ED COURSE  I reviewed the triage vital signs and the nursing notes.  Differential diagnosis including vasovagal episode, dysrhythmia, orthostatic hypotension, ACS, dysrhythmia, anemia, sepsis, infectious process  Low suspicion for CVA, no room spinning dizziness and no central findings concerning for vertigo.  Repeat blood pressure 100/49.  Plan for lab work, EKG, troponin and will give 1 L of IV fluids for reevaluate.  Will obtain an initial set of blood cultures and lactic acid.  EKG  I, Corena Herter, the attending physician, personally viewed and interpreted this ECG.  Normal sinus rhythm.  Significantly prolonged QT interval, QTc reading at 692.  No significant ST elevation or depression.  No signs of  acute ischemia.  Repeat EKG obtained and continued to have prolonged QT interval in the 600s.   No tachycardic or bradycardic dysrhythmias while on cardiac telemetry.  RADIOLOGY I independently reviewed imaging, my interpretation of imaging: Chest x-ray -no focal findings of pneumonia  LABS (all labs ordered are listed, but only abnormal results are displayed) Labs interpreted as -    Labs Reviewed  LACTIC ACID, PLASMA - Abnormal; Notable for the following components:      Result Value   Lactic Acid, Venous 2.0 (*)    All other components within normal limits  COMPREHENSIVE METABOLIC PANEL - Abnormal; Notable for the following components:   Sodium 132 (*)    Chloride 96 (*)    Glucose, Bld 165 (*)    AST 13 (*)    All other components within normal limits  CBC WITH DIFFERENTIAL/PLATELET - Abnormal; Notable for the following components:   WBC 15.6 (*)    Hemoglobin 10.4 (*)    HCT 32.6 (*)    Neutro Abs 11.5 (*)    Monocytes Absolute 1.8 (*)    All other components within normal limits  URINALYSIS, W/ REFLEX TO CULTURE (INFECTION SUSPECTED) - Abnormal; Notable for the following components:   Color, Urine YELLOW (*)    APPearance CLOUDY (*)    Protein, ur 30 (*)    Leukocytes,Ua LARGE (*)    Bacteria, UA RARE (*)    All other components within normal limits  CULTURE, BLOOD (SINGLE)  CULTURE, BLOOD (SINGLE)  URINE CULTURE  LACTIC ACID, PLASMA  LIPASE, BLOOD  MAGNESIUM  LACTIC ACID, PLASMA  LACTIC ACID, PLASMA  TROPONIN I (HIGH SENSITIVITY)  TROPONIN I (HIGH SENSITIVITY)     MDM  Patient has a nonfocal neurologic exam and no vertiginous symptoms have a low suspicion for central cause of dizziness.  Blood pressure improved after 500 bolus.  Given her leukocytosis, hypotension and tachypnea met criteria for sepsis so started on antibiotics to treat unknown source.  On reevaluation prolonged QT interval given IV magnesium.  Uncertain if prolonged QTc caused her to  have a dysrhythmia causing her dizziness.  Potassium level within normal limits.  Adding on magnesium level.  On reevaluation having some mild diffuse abdominal tenderness to palpation, will order a CT scan of the chest abdomen and pelvis to further evaluate for possible source of  an infection.  Initial lactic acid was normal however repeat lactic acid elevated at 2.0.  Will obtain a third lactic acid.  Serial troponins are negative have low suspicion for ACS.  On reevaluation patient continues to not feel well.  Soft blood pressure.  UA without an obvious urinary tract infection but does have significant RBCs.  Orthostatic blood pressures are positive.  CT scan is currently pending but I do not see any obvious abnormalities on my review and has a significant delay of CT scan reads.  Consulting hospitalist for admission given concern for sepsis.     PROCEDURES:  Critical Care performed: yes  .Critical Care  Performed by: Corena Herter, MD Authorized by: Corena Herter, MD   Critical care provider statement:    Critical care time (minutes):  30   Critical care time was exclusive of:  Separately billable procedures and treating other patients   Critical care was necessary to treat or prevent imminent or life-threatening deterioration of the following conditions:  Circulatory failure   Critical care was time spent personally by me on the following activities:  Development of treatment plan with patient or surrogate, discussions with consultants, evaluation of patient's response to treatment, examination of patient, ordering and review of laboratory studies, ordering and review of radiographic studies, ordering and performing treatments and interventions, pulse oximetry, re-evaluation of patient's condition and review of old charts   Patient's presentation is most consistent with acute presentation with potential threat to life or bodily function.   MEDICATIONS ORDERED IN ED: Medications   lactated ringers infusion ( Intravenous New Bag/Given 01/31/24 1837)  sodium chloride 0.9 % bolus 1,000 mL (0 mLs Intravenous Stopped 01/31/24 2157)  ceFEPIme (MAXIPIME) 2 g in sodium chloride 0.9 % 100 mL IVPB (0 g Intravenous Stopped 01/31/24 1903)  metroNIDAZOLE (FLAGYL) IVPB 500 mg (0 mg Intravenous Stopped 01/31/24 2012)  vancomycin (VANCOCIN) IVPB 1000 mg/200 mL premix (0 mg Intravenous Stopped 01/31/24 2157)  magnesium sulfate IVPB 1 g 100 mL (0 g Intravenous Stopped 01/31/24 2157)  iohexol (OMNIPAQUE) 300 MG/ML solution 100 mL (100 mLs Intravenous Contrast Given 01/31/24 2016)    FINAL CLINICAL IMPRESSION(S) / ED DIAGNOSES   Final diagnoses:  Dizziness  Syncope, unspecified syncope type  Prolonged Q-T interval on ECG     Rx / DC Orders   ED Discharge Orders     None        Note:  This document was prepared using Dragon voice recognition software and may include unintentional dictation errors.   Corena Herter, MD 01/31/24 [phone]

## 2024-02-01 ENCOUNTER — Encounter: Payer: Self-pay | Admitting: Student in an Organized Health Care Education/Training Program

## 2024-02-01 DIAGNOSIS — Z7983 Long term (current) use of bisphosphonates: Secondary | ICD-10-CM | POA: Diagnosis not present

## 2024-02-01 DIAGNOSIS — N12 Tubulo-interstitial nephritis, not specified as acute or chronic: Secondary | ICD-10-CM | POA: Diagnosis present

## 2024-02-01 DIAGNOSIS — R918 Other nonspecific abnormal finding of lung field: Secondary | ICD-10-CM | POA: Diagnosis present

## 2024-02-01 DIAGNOSIS — N390 Urinary tract infection, site not specified: Secondary | ICD-10-CM

## 2024-02-01 DIAGNOSIS — I959 Hypotension, unspecified: Secondary | ICD-10-CM | POA: Diagnosis present

## 2024-02-01 DIAGNOSIS — E119 Type 2 diabetes mellitus without complications: Secondary | ICD-10-CM | POA: Diagnosis present

## 2024-02-01 DIAGNOSIS — R110 Nausea: Secondary | ICD-10-CM | POA: Diagnosis present

## 2024-02-01 DIAGNOSIS — E669 Obesity, unspecified: Secondary | ICD-10-CM | POA: Diagnosis present

## 2024-02-01 DIAGNOSIS — M810 Age-related osteoporosis without current pathological fracture: Secondary | ICD-10-CM | POA: Diagnosis present

## 2024-02-01 DIAGNOSIS — N10 Acute tubulo-interstitial nephritis: Secondary | ICD-10-CM | POA: Diagnosis present

## 2024-02-01 DIAGNOSIS — M5412 Radiculopathy, cervical region: Secondary | ICD-10-CM | POA: Diagnosis present

## 2024-02-01 DIAGNOSIS — Z7984 Long term (current) use of oral hypoglycemic drugs: Secondary | ICD-10-CM | POA: Diagnosis not present

## 2024-02-01 DIAGNOSIS — Z885 Allergy status to narcotic agent status: Secondary | ICD-10-CM | POA: Diagnosis not present

## 2024-02-01 DIAGNOSIS — A419 Sepsis, unspecified organism: Secondary | ICD-10-CM | POA: Diagnosis present

## 2024-02-01 DIAGNOSIS — Z7982 Long term (current) use of aspirin: Secondary | ICD-10-CM | POA: Diagnosis not present

## 2024-02-01 DIAGNOSIS — R42 Dizziness and giddiness: Secondary | ICD-10-CM | POA: Diagnosis present

## 2024-02-01 DIAGNOSIS — R55 Syncope and collapse: Secondary | ICD-10-CM | POA: Diagnosis present

## 2024-02-01 DIAGNOSIS — E785 Hyperlipidemia, unspecified: Secondary | ICD-10-CM | POA: Diagnosis present

## 2024-02-01 DIAGNOSIS — G20A1 Parkinson's disease without dyskinesia, without mention of fluctuations: Secondary | ICD-10-CM | POA: Insufficient documentation

## 2024-02-01 DIAGNOSIS — R9431 Abnormal electrocardiogram [ECG] [EKG]: Secondary | ICD-10-CM | POA: Diagnosis present

## 2024-02-01 DIAGNOSIS — R519 Headache, unspecified: Secondary | ICD-10-CM | POA: Diagnosis present

## 2024-02-01 DIAGNOSIS — M5441 Lumbago with sciatica, right side: Secondary | ICD-10-CM | POA: Diagnosis present

## 2024-02-01 DIAGNOSIS — J4520 Mild intermittent asthma, uncomplicated: Secondary | ICD-10-CM | POA: Diagnosis present

## 2024-02-01 DIAGNOSIS — G8929 Other chronic pain: Secondary | ICD-10-CM | POA: Diagnosis present

## 2024-02-01 DIAGNOSIS — I10 Essential (primary) hypertension: Secondary | ICD-10-CM | POA: Diagnosis present

## 2024-02-01 DIAGNOSIS — M549 Dorsalgia, unspecified: Secondary | ICD-10-CM | POA: Diagnosis present

## 2024-02-01 DIAGNOSIS — M5442 Lumbago with sciatica, left side: Secondary | ICD-10-CM | POA: Diagnosis present

## 2024-02-01 LAB — CBG MONITORING, ED
Glucose-Capillary: 138 mg/dL — ABNORMAL HIGH (ref 70–99)
Glucose-Capillary: 94 mg/dL (ref 70–99)

## 2024-02-01 LAB — GLUCOSE, CAPILLARY
Glucose-Capillary: 98 mg/dL (ref 70–99)
Glucose-Capillary: 77 mg/dL (ref 70–99)
Glucose-Capillary: 118 mg/dL — ABNORMAL HIGH (ref 70–99)

## 2024-02-01 LAB — CORTISOL-AM, BLOOD: Cortisol - AM: 6.8 ug/dL (ref 6.7–22.6)

## 2024-02-01 LAB — PROTIME-INR
INR: 1 (ref 0.8–1.2)
Prothrombin Time: 13.7 s (ref 11.4–15.2)

## 2024-02-01 MED ORDER — SODIUM CHLORIDE 0.9 % IV SOLN
2.0000 g | INTRAVENOUS | Status: DC
  Administered 2024-02-01 – 2024-02-02 (×2): 2 g via INTRAVENOUS
  Filled 2024-02-01 (×2): qty 20

## 2024-02-01 MED ORDER — HYDROCODONE-ACETAMINOPHEN 5-325 MG PO TABS
1.0000 | ORAL_TABLET | Freq: Four times a day (QID) | ORAL | Status: DC | PRN
Start: 2024-02-01 — End: 2024-02-02

## 2024-02-01 MED ORDER — ASPIRIN 81 MG PO TBEC
81.0000 mg | DELAYED_RELEASE_TABLET | Freq: Every day | ORAL | Status: DC
Start: 2024-02-01 — End: 2024-02-02
  Administered 2024-02-01 – 2024-02-02 (×2): 81 mg via ORAL
  Filled 2024-02-01 (×2): qty 1

## 2024-02-01 MED ORDER — ENOXAPARIN SODIUM 40 MG/0.4ML IJ SOSY
40.0000 mg | PREFILLED_SYRINGE | INTRAMUSCULAR | Status: DC
  Administered 2024-02-01: 40 mg via SUBCUTANEOUS
  Filled 2024-02-01 (×2): qty 0.4

## 2024-02-01 MED ORDER — DULOXETINE HCL 30 MG PO CPEP
60.0000 mg | ORAL_CAPSULE | Freq: Every day | ORAL | Status: DC
  Administered 2024-02-02: 60 mg via ORAL
  Filled 2024-02-01 (×2): qty 2

## 2024-02-01 MED ORDER — SIMVASTATIN 20 MG PO TABS
40.0000 mg | ORAL_TABLET | Freq: Every day | ORAL | Status: DC
Start: 2024-02-01 — End: 2024-02-02
  Administered 2024-02-01 – 2024-02-02 (×2): 40 mg via ORAL
  Filled 2024-02-01: qty 4
  Filled 2024-02-01: qty 2

## 2024-02-01 MED ORDER — PROCHLORPERAZINE EDISYLATE 10 MG/2ML IJ SOLN: 10.0000 mg | Freq: Four times a day (QID) | INTRAMUSCULAR | Status: DC | PRN

## 2024-02-01 MED ORDER — GABAPENTIN 300 MG PO CAPS
300.0000 mg | ORAL_CAPSULE | Freq: Two times a day (BID) | ORAL | Status: DC
  Administered 2024-02-01 – 2024-02-02 (×3): 300 mg via ORAL
  Filled 2024-02-01 (×3): qty 1

## 2024-02-01 MED ORDER — ACETAMINOPHEN 325 MG PO TABS
650.0000 mg | ORAL_TABLET | Freq: Four times a day (QID) | ORAL | Status: DC | PRN
  Administered 2024-02-01: 650 mg via ORAL
  Filled 2024-02-01: qty 2

## 2024-02-01 MED ORDER — INSULIN ASPART 100 UNIT/ML IJ SOLN
0.0000 [IU] | Freq: Three times a day (TID) | INTRAMUSCULAR | Status: DC
  Filled 2024-02-01: qty 1

## 2024-02-01 MED ORDER — INSULIN ASPART 100 UNIT/ML IJ SOLN
0.0000 [IU] | Freq: Every day | INTRAMUSCULAR | Status: DC
Start: 2024-02-01 — End: 2024-02-01

## 2024-02-01 MED ORDER — CARBIDOPA-LEVODOPA 25-100 MG PO TABS
1.0000 | ORAL_TABLET | Freq: Once | ORAL | Status: DC
  Filled 2024-02-01: qty 1

## 2024-02-01 MED ORDER — CARBIDOPA-LEVODOPA 25-100 MG PO TABS: 1.0000 | ORAL_TABLET | Freq: Two times a day (BID) | ORAL | Status: DC

## 2024-02-01 MED ORDER — BISACODYL 5 MG PO TBEC
10.0000 mg | DELAYED_RELEASE_TABLET | Freq: Once | ORAL | Status: AC
  Administered 2024-02-01: 10 mg via ORAL
  Filled 2024-02-01: qty 2

## 2024-02-01 MED ORDER — CARBIDOPA-LEVODOPA 25-100 MG PO TABS
1.0000 | ORAL_TABLET | Freq: Two times a day (BID) | ORAL | Status: DC
  Administered 2024-02-01 – 2024-02-02 (×3): 1 via ORAL
  Filled 2024-02-01 (×3): qty 1

## 2024-02-01 MED ORDER — ACETAMINOPHEN 650 MG RE SUPP: 650.0000 mg | Freq: Four times a day (QID) | RECTAL | Status: DC | PRN

## 2024-02-01 NOTE — Assessment & Plan Note (Signed)
-  Continue Sinemet

## 2024-02-01 NOTE — H&P (Signed)
 History and Physical    Patient: Lindsey George ZOX:[id number] DOB: 12/03/1950 DOA: 01/31/2024 DOS: the patient was seen and examined on 02/01/2024 PCP: Bridgette Habermann, PA-C  Patient coming from: Home  Chief Complaint:  Chief Complaint  Patient presents with   Dizziness    HPI: Lindsey George is a 73 y.o. female with medical history significant for  DM, HTN reactive airway disease, chronic back pain, cervical radiculopathy Parkinson's disease, seen by her doctor a few days ago on 2/28 for dysuria, who presents to the ED With dizziness and presyncope.  Patient was in her usual state of health and went to the store and while standing she felt dizzy like she was about to pass out.  She had associated nausea but did not vomit.  She was helped her chair.  EMS was called.  Upon their arrival systolic was in the 60s and she received 500 mL bolus with improvement in systolic 100 by arrival in the ED. Of note patient had an abnormal UA on 12/2026, urine culture unavailable.  She was not prescribed antibiotics.  She currently denies dysuria or abdominal pain.  Denies fever.  Denies chest pain, palpitations, headache or visual disturbance or one-sided weakness numbness or tingling. ED course and data review: Afebrile, tachypneic to 25 with BP 102/49 on arrival O2 sat in the high 90s on room air0.4 Hemoglobin 10, baseline 11.7 CMP notable for sodium of 132 but otherwise unremarkable Lipase and LFTs WNL Urinalysis with large leukocyte esterase and rare bacteria EKG, personally viewed and interpreted showing sinus at 74 and prolonged QT CT chest abdomen pelvis showing findings consistent with pyelonephritis as well as pleural and pulmonary nodules  Patient was started on broad-spectrum antibiotics for sepsis of unknown source, likely prior to CT results Given a fluid bolus Hospitalist consulted for admission.   Review of Systems: As mentioned in the history of present illness. All other systems  reviewed and are negative.  Past Medical History:  Diagnosis Date   Asthma    Cancer (HCC)    skin ca   Chronic back pain    Diabetes mellitus without complication (HCC)    Hyperlipidemia    Hypertension    Obesity    Osteoporosis    Past Surgical History:  Procedure Laterality Date   ABDOMINAL HYSTERECTOMY     BREAST BIOPSY Right 06/22/11   bx/clip-neg   BREAST CYST ASPIRATION Left    neg   COLONOSCOPY  2014   cleared for 5 yrs- [facility] doc   COLONOSCOPY WITH PROPOFOL N/A 03/25/2018   Procedure: COLONOSCOPY WITH PROPOFOL;  Surgeon: Scot Jun, MD;  Location: [facility] ENDOSCOPY;  Service: Endoscopy;  Laterality: N/A;   COLONOSCOPY WITH PROPOFOL N/A 04/20/2023   Procedure: COLONOSCOPY WITH PROPOFOL;  Surgeon: Regis Bill, MD;  Location: [facility] ENDOSCOPY;  Service: Endoscopy;  Laterality: N/A;  NEEDS AM; HAS A GRADUATION AT 6:30 PM   KNEE ARTHROSCOPY Right 11/04/2023   Procedure: RIGHT KNEE ARTHROSCOPY WITH DEBRIDEMENT, PARTIAL MEDIAL MENISCECTOMY, AND REINJECTION OF HARVESTED FAT CELLS.;  Surgeon: Christena Flake, MD;  Location: [facility] ORS;  Service: Orthopedics;  Laterality: Right;   PARTIAL HYSTERECTOMY     SPLENECTOMY     Social History:  reports that she has never smoked. She has never used smokeless tobacco. She reports that she does not currently use alcohol. She reports that she does not use drugs.  Allergies  Allergen Reactions   Codeine Other (See Comments)    Abdominal pain  Family History  Problem Relation Age of Onset   Breast cancer Sister 57   Cancer Sister    Heart disease Sister    Cancer Father    Stroke Father    Heart disease Sister     Prior to Admission medications   Medication Sig Start Date End Date Taking? Authorizing Provider  alendronate (FOSAMAX) 70 MG tablet TAKE 1 TABLET BY MOUTH ONCE A WEEK WITH  A  FULL  GLASS  OF  WATER  ON  AN  EMPTY  STOMACH 01/29/24   Duanne Limerick, MD  Ascorbic Acid (VITAMIN C) 1000 MG tablet Take 1,000 mg by mouth  daily. Chewable    [provider]  aspirin 81 MG tablet Take 81 mg by mouth daily.    [provider]  Calcium Carbonate-Vitamin D 600-10 MG-MCG TABS Take 2 tablets by mouth daily with breakfast.    [provider]  carbidopa-levodopa (SINEMET IR) 25-100 MG tablet Take 1 tablet by mouth in the morning and at bedtime. 05/23/22   [provider]  cyanocobalamin (VITAMIN B12) 1000 MCG tablet Take 1,000 mcg by mouth daily.    [provider]  docusate sodium (COLACE) 50 MG capsule Take 1 capsule (50 mg total) by mouth daily as needed for mild constipation or moderate constipation. 11/04/23   Poggi, Excell Seltzer, MD  DULoxetine (CYMBALTA) 30 MG capsule TAKE 2  BY MOUTH ONCE DAILY 11/27/23   Duanne Limerick, MD  EPIPEN 2-PAK 0.3 MG/0.3ML SOAJ injection Inject 0.3 mg into the muscle as needed for anaphylaxis. 02/03/16   [provider]  gabapentin (NEURONTIN) 300 MG capsule Take 1 capsule (300 mg total) by mouth 2 (two) times daily. 11/04/23   Poggi, Excell Seltzer, MD  glipiZIDE (GLIPIZIDE XL) 5 MG 24 hr tablet Take 1 tablet (5 mg total) by mouth daily. 12/20/23   Duanne Limerick, MD  HYDROcodone-acetaminophen (NORCO/VICODIN) 5-325 MG tablet Take 1-2 tablets by mouth every 6 (six) hours as needed for moderate pain (pain score 4-6) or severe pain (pain score 7-10). 11/04/23 11/03/24  Poggi, Excell Seltzer, MD  losartan (COZAAR) 50 MG tablet Take 1 tablet (50 mg total) by mouth daily. 12/20/23   Duanne Limerick, MD  metFORMIN (GLUCOPHAGE) 500 MG tablet TAKE ONE TABLET BY MOUTH TWICE DAILY 12/20/23   Duanne Limerick, MD  [facility] ULTRA test strip USE 1 STRIP TO CHECK GLUCOSE ONCE DAILY 06/18/20   Duanne Limerick, MD  pioglitazone (ACTOS) 15 MG tablet Take 1 tablet (15 mg total) by mouth daily. 12/20/23   Duanne Limerick, MD  simvastatin (ZOCOR) 40 MG tablet Take 1 tablet (40 mg total) by mouth daily. 12/20/23   Duanne Limerick, MD    Physical Exam: Vitals:   01/31/24 2316 01/31/24 2330  02/01/24 0000 02/01/24 0030  BP: 109/74  (!) 126/57 (!) 126/52  Pulse: 84  83 87  Resp: (!) 24  (!) 25 (!) 31  Temp:  98 F (36.7 C)    TempSrc:      SpO2: 99%  97% 91%   Physical Exam Vitals and nursing note reviewed.  Constitutional:      General: She is not in acute distress. HENT:     Head: Normocephalic and atraumatic.  Cardiovascular:     Rate and Rhythm: Normal rate and regular rhythm.     Heart sounds: Normal heart sounds.  Pulmonary:     Effort: Pulmonary effort is normal.     Breath  sounds: Normal breath sounds.  Abdominal:     Palpations: Abdomen is soft.     Tenderness: There is no abdominal tenderness.  Neurological:     Mental Status: Mental status is at baseline.     Labs on Admission: I have personally reviewed following labs and imaging studies  CBC: Recent Labs  Lab 01/31/24 1636  WBC 15.6*  NEUTROABS 11.5*  HGB 10.4*  HCT 32.6*  MCV 82.5  PLT 342   Basic Metabolic Panel: Recent Labs  Lab 01/31/24 1636  NA 132*  K 3.5  CL 96*  CO2 26  GLUCOSE 165*  BUN 15  CREATININE 0.86  CALCIUM 9.0  MG 2.1   GFR: CrCl cannot be calculated (Unknown ideal weight.). Liver Function Tests: Recent Labs  Lab 01/31/24 1636  AST 13*  ALT <5  ALKPHOS 47  BILITOT 0.8  PROT 6.8  ALBUMIN 3.5   Recent Labs  Lab 01/31/24 1636  LIPASE 27   No results for input(s): "AMMONIA" in the last 168 hours. Coagulation Profile: No results for input(s): "INR", "PROTIME" in the last 168 hours. Cardiac Enzymes: No results for input(s): "CKTOTAL", "CKMB", "CKMBINDEX", "TROPONINI" in the last 168 hours. BNP (last 3 results) No results for input(s): "PROBNP" in the last 8760 hours. HbA1C: No results for input(s): "HGBA1C" in the last 72 hours. CBG: No results for input(s): "GLUCAP" in the last 168 hours. Lipid Profile: No results for input(s): "CHOL", "HDL", "LDLCALC", "TRIG", "CHOLHDL", "LDLDIRECT" in the last 72 hours. Thyroid Function Tests: No results  for input(s): "TSH", "T4TOTAL", "FREET4", "T3FREE", "THYROIDAB" in the last 72 hours. Anemia Panel: No results for input(s): "VITAMINB12", "FOLATE", "FERRITIN", "TIBC", "IRON", "RETICCTPCT" in the last 72 hours. Urine analysis:    Component Value Date/Time   COLORURINE YELLOW (A) 01/31/2024 1627   APPEARANCEUR CLOUDY (A) 01/31/2024 1627   LABSPEC 1.006 01/31/2024 1627   PHURINE 6.0 01/31/2024 1627   GLUCOSEU NEGATIVE 01/31/2024 1627   HGBUR NEGATIVE 01/31/2024 1627   BILIRUBINUR NEGATIVE 01/31/2024 1627   BILIRUBINUR negative 09/13/2023 1516   KETONESUR NEGATIVE 01/31/2024 1627   PROTEINUR 30 (A) 01/31/2024 1627   UROBILINOGEN 0.2 09/13/2023 1516   NITRITE NEGATIVE 01/31/2024 1627   LEUKOCYTESUR LARGE (A) 01/31/2024 1627    Radiological Exams on Admission: CT CHEST ABDOMEN PELVIS W CONTRAST Result Date: 01/31/2024 CLINICAL DATA:  73 year old for sepsis workup. EXAM: CT CHEST, ABDOMEN, AND PELVIS WITH CONTRAST TECHNIQUE: Multidetector CT imaging of the chest, abdomen and pelvis was performed following the standard protocol during bolus administration of intravenous contrast. RADIATION DOSE REDUCTION: This exam was performed according to the departmental dose-optimization program which includes automated exposure control, adjustment of the mA and/or kV according to patient size and/or use of iterative reconstruction technique. CONTRAST:  OMNIPAQUE IOHEXOL 300 MG/ML  SOLN COMPARISON:  Limited comparison available with CT scan lumbar spine 11/10/2022. FINDINGS: CT CHEST FINDINGS Cardiovascular: There is mild cardiomegaly. No pericardial effusion. There is no visible coronary calcification. There is aortic atherosclerosis without aneurysm, stenosis or dissection with mild descending tortuosity. The pulmonary arteries and veins are normal caliber. No central arterial embolus is seen. The great vessels are clear. Mediastinum/Nodes: There is a small left posterior diaphragmatic fat herniation.  Thyroid gland, both axillary spaces are unremarkable. There is no intrathoracic adenopathy. The trachea and main bronchi are clear. The thoracic esophagus is unremarkable. Lungs/Pleura: There are scattered linear scarring or atelectasis in the lung bases. Moderately elevated right hemidiaphragm most likely due to eventration. The lungs are clear  of focal infiltrates. There is a well-circumscribed ovoid focal pleural thickening in the posterolateral lower left chest measuring 2 x 0.8 cm on 5:94 outside the plane of the prior imaging. This could be a focal fibrous tumor of the pleura, a noncalcified pleural plaque such as due to asbestos exposure, or a primary or metastatic neoplasm. I believe this was probably present on PA and lateral chest films from 08/11/2018 and 06/18/2017 so the index of suspicion is low. Laterally in the right middle lobe there is a 4 mm subpleural noncalcified nodule, and just above this level on 5:72 there is a 6 mm subsolid nodule requiring follow-up. The lungs are otherwise clear. There is no pleural effusion or pneumothorax. Musculoskeletal: There is advanced degenerative disc disease and spondylosis of the thoracic spine. Osteopenia. Multiple healed fracture deformities of left ribs. No acute or other significant osseous findings. CT ABDOMEN PELVIS FINDINGS Hepatobiliary: Liver measures 19 cm length, mildly steatotic without mass enhancement. The gallbladder and bile ducts are unremarkable. Pancreas: Ductal anatomy consistent with pancreas divisum. No mass or ductal dilatation. Spleen: Appears to have been surgically removed. There are postsurgical changes in the subphrenic space and a 4 cm splenule. Adrenals/Urinary Tract: There is no adrenal or renal mass enhancement. On the left there is patchy cortical hypoenhancement with adjacent inflammatory reaction. Pyelonephritis is favored over vascular etiology due to good opacification in the renal arteries and veins. There is also slight  urothelial thickening of the renal pelvis. Correlate with urinalysis. Right kidney is unremarkable. There is no urinary stone or obstruction. The bladder is unremarkable. Stomach/Bowel: Stomach is within normal limits. Appendix appears normal. No evidence of bowel wall thickening, distention, or inflammatory changes. There is uncomplicated sigmoid diverticulosis. Mild-to-moderate fecal stasis. Vascular/Lymphatic: Aortic atherosclerosis. No enlarged abdominal or pelvic lymph nodes. Reproductive: Status post hysterectomy. No adnexal masses. Multiple pelvic phleboliths. Other: Small umbilical fat hernia. No incarcerated hernia. No free fluid, free hemorrhage or free air. Musculoskeletal: There is osteopenia, levoscoliosis and degenerative change of the lumbar spine. There is mild chronic wedging at L2 and 5, minimal grade 1 degenerative anterolisthesis at L3-4 and L4-5. IMPRESSION: 1. Patchy cortical hypoenhancement of the left kidney with adjacent inflammatory reaction, and slight urothelial thickening of the renal pelvis. Findings consistent with pyelonephritis. Correlate with urinalysis. 2. No other acute chest, abdominal or pelvic findings. 3. 2 x 0.8 cm well-circumscribed ovoid pleural thickening in the posterolateral lower left chest. This could be a focal fibrous tumor of the pleura, a noncalcified pleural plaque such as due to asbestos exposure, or a primary or metastatic neoplasm. I believe this was probably present on PA and lateral chest films from 08/11/2018 and 06/18/2017 so the index of suspicion is low. Either PET-CT or attention on follow-up for the right middle lobe findings recommended. 4. Two right middle lobe nodules, largest is a 6 mm subsolid nodule. Follow-up non-contrast CT recommended at 3-6 months to confirm persistence. If unchanged, and solid component remains <6 mm, annual CT is recommended until 5 years of stability has been established. If persistent these nodules should be considered  highly suspicious if the solid component of the nodule is 6 mm or greater in size and enlarging. This recommendation follows the consensus statement: Guidelines for Management of Incidental Pulmonary Nodules Detected on CT Images: From the Fleischner Society 2017; Radiology 2017; 284:228-243. 5. Aortic atherosclerosis. 6. Constipation and diverticulosis. 7. Osteopenia and degenerative change. 8. Small umbilical fat hernia. Aortic Atherosclerosis (ICD10-I70.0). Electronically Signed   By: Almira Bar  M.D.   On: 01/31/2024 23:39   DG Chest Port 1 View Result Date: 01/31/2024 CLINICAL DATA:  Questionable sepsis, dizziness, low blood pressure EXAM: PORTABLE CHEST 1 VIEW COMPARISON:  08/11/2018 FINDINGS: Stable cardiomediastinal silhouette. Aortic atherosclerotic calcification. Diffuse interstitial coarsening greater on the left. Bibasilar atelectasis. No pleural effusion or pneumothorax. No displaced rib fractures. Elevated right hemidiaphragm. IMPRESSION: Diffuse interstitial coarsening greater on the left may be due to atypical infection or edema. Electronically Signed   By: Minerva Fester M.D.   On: 01/31/2024 20:19     Data Reviewed: Relevant notes from primary care and specialist visits, past discharge summaries as available in EHR, including Care Everywhere. Prior diagnostic testing as pertinent to current admission diagnoses Updated medications and problem lists for reconciliation ED course, including vitals, labs, imaging, treatment and response to treatment Triage notes, nursing and pharmacy notes and ED provider's notes Notable results as noted in HPI   Assessment and Plan: * Sepsis secondary to acute pyelonephritis (HCC) Urinalysis consistent with UTI and CT chest abdomen pelvis consistent with pyelonephritis Patient hypotensive, tachypneic with leukocytosis and infection.  Spiked a temperature following admission Continue sepsis fluids Ceftriaxone Follow cultures and follow culture  from UA done on 2/28  Postural dizziness with presyncope Hypotension  Possibly vasovagal related to acute infection versus hypotension related to sepsis Continue IV fluid resuscitation Hold home antihypertensives  Prolonged QT interval Avoid QT prolonging drugs  Parkinson's disease (HCC) Continue Sinemet  Reactive airway disease, mild intermittent, uncomplicated Not acutely exacerbated Albuterol as needed  Cervical radiculopathy Chronic low back pain with sciatica Continue gabapentin and duloxetine  Essential hypertension Holding antihypertensives due to hypotension, to resume as appropriate  Type 2 diabetes mellitus without complication, without long-term current use of insulin (HCC) Sliding scale insulin coverage Hold home hypoglycemic agents     DVT prophylaxis: Lovenox  Consults: none  Advance Care Planning:   Code Status: Prior   Family Communication: none  Disposition Plan: Back to previous home environment  Severity of Illness: The appropriate patient status for this patient is OBSERVATION. Observation status is judged to be reasonable and necessary in order to provide the required intensity of service to ensure the patient's safety. The patient's presenting symptoms, physical exam findings, and initial radiographic and laboratory data in the context of their medical condition is felt to place them at decreased risk for further clinical deterioration. Furthermore, it is anticipated that the patient will be medically stable for discharge from the hospital within 2 midnights of admission.   Author: Andris Baumann, MD 02/01/2024 1:09 AM  For on call review [email or web address].

## 2024-02-01 NOTE — Assessment & Plan Note (Signed)
Not acutely exacerbated.  Albuterol as needed 

## 2024-02-01 NOTE — ED Notes (Signed)
 Pt placed into gown and heart monitor. Pt resting comfortably in bed at this time. Pt is alert and oriented with even and regular respirations. No acute distress noted. Pt denies any needs at this time. Call light within reach.

## 2024-02-01 NOTE — Assessment & Plan Note (Signed)
 Chronic low back pain with sciatica Continue gabapentin and duloxetine

## 2024-02-01 NOTE — Assessment & Plan Note (Signed)
 Hypotension  Possibly vasovagal related to acute infection versus hypotension related to sepsis Continue IV fluid resuscitation Hold home antihypertensives

## 2024-02-01 NOTE — ED Notes (Signed)
Pt resting comfortably in bed at this time. Pt is alert and oriented with even and regular respirations. No acute distress noted. Pt denies any needs at this time. Call light within reach.  °

## 2024-02-01 NOTE — ED Notes (Signed)
Pt ambulated to bathroom with strong steady gait.  ?

## 2024-02-01 NOTE — ED Notes (Signed)
 Dr. Jerrye Beavers secure chated about pts new need for oxygen and pts temperature of 100.6

## 2024-02-01 NOTE — Plan of Care (Signed)
   Problem: Coping: Goal: Ability to adjust to condition or change in health will improve Outcome: Progressing   Problem: Fluid Volume: Goal: Ability to maintain a balanced intake and output will improve Outcome: Progressing   Problem: Health Behavior/Discharge Planning: Goal: Ability to manage health-related needs will improve Outcome: Progressing   Problem: Nutritional: Goal: Maintenance of adequate nutrition will improve Outcome: Progressing

## 2024-02-01 NOTE — ED Notes (Signed)
 Pt noted to be 88% on room air. Pt placed on 2 liters nasal cannula

## 2024-02-01 NOTE — Assessment & Plan Note (Signed)
 Holding antihypertensives due to hypotension, to resume as appropriate

## 2024-02-01 NOTE — Assessment & Plan Note (Addendum)
 Sliding scale insulin coverage Hold home hypoglycemic agents

## 2024-02-01 NOTE — Progress Notes (Signed)
  INTERVAL PROGRESS NOTE    Lindsey George- 73 y.o. female  LOS: 0 __________________________________________________________________  SUBJECTIVE: Admitted 01/31/2024 with cc of  Chief Complaint  Patient presents with   Dizziness   Since admission, feeling improved. Endorses headache. No more syncopal episodes. Denies pelvic pain or dysuria.   OBJECTIVE: Blood pressure (!) 114/58, pulse 63, temperature 98.6 F (37 C), temperature source Oral, resp. rate 16, weight 82.1 kg, SpO2 97%.  General: NAD, pleasant, able to participate in exam Cardiac: RRR, normal heart sounds, no murmurs. 2+ radial and PT pulses bilaterally Respiratory: CTAB, normal effort, No wheezes, rales or rhonchi Abdomen: soft, nontender, nondistended, no hepatic or splenomegaly, +BS Extremities: no edema. WWP. Skin: warm and dry, no rashes noted Neuro: alert and oriented, no focal deficits Psych: Normal affect and mood   ASSESSMENT/PLAN:  I have reviewed the full H&P by Dr. Para March, and I reviewed pertinent findings.   In addition:  Sepsis secondary to acute pyelonephritis (HCC) Urinalysis consistent with UTI and CT chest abdomen pelvis consistent with pyelonephritis Patient hypotensive, tachypneic with leukocytosis and infection.  Febrile this morning.  - will continue IV Abx until cultures returned.  - f/u Bxcx, Uxcx   Postural dizziness with presyncope Hypotension- improving with holding of home antihypertensives and IVF.  - titrate back on home meds as tolerated - orthostatic VS   Principal Problem:   Sepsis secondary to acute pyelonephritis (HCC) Active Problems:   Postural dizziness with presyncope   Prolonged QT interval   Type 2 diabetes mellitus without complication, without long-term current use of insulin (HCC)   Essential hypertension   Cervical radiculopathy   Chronic bilateral low back pain with bilateral sciatica   Reactive airway disease, mild intermittent, uncomplicated    Parkinson's disease (HCC)   Leeroy Bock, DO [facility] 02/01/2024, 8:31 AM    [email or web address] Available by Epic secure chat 7AM-7PM. If 7PM-7AM, please contact night-coverage   No Charge

## 2024-02-01 NOTE — ED Notes (Signed)
 CCMD called to place pt on monitor

## 2024-02-01 NOTE — Assessment & Plan Note (Addendum)
 Urinalysis consistent with UTI and CT chest abdomen pelvis consistent with pyelonephritis Patient hypotensive, tachypneic with leukocytosis and infection.  Spiked a temperature following admission Continue sepsis fluids Ceftriaxone Follow cultures and follow culture from UA done on 2/28

## 2024-02-01 NOTE — Assessment & Plan Note (Signed)
Avoid QT prolonging drugs  

## 2024-02-02 DIAGNOSIS — A419 Sepsis, unspecified organism: Secondary | ICD-10-CM | POA: Diagnosis not present

## 2024-02-02 DIAGNOSIS — N390 Urinary tract infection, site not specified: Secondary | ICD-10-CM | POA: Diagnosis not present

## 2024-02-02 LAB — BASIC METABOLIC PANEL
Anion gap: 9 (ref 5–15)
BUN: 13 mg/dL (ref 8–23)
CO2: 25 mmol/L (ref 22–32)
Calcium: 9.3 mg/dL (ref 8.9–10.3)
Chloride: 101 mmol/L (ref 98–111)
Creatinine, Ser: 0.45 mg/dL (ref 0.44–1.00)
GFR, Estimated: >60 mL/min (ref >60)
Glucose, Bld: 118 mg/dL — ABNORMAL HIGH (ref 70–99)
Potassium: 3.3 mmol/L — ABNORMAL LOW (ref 3.5–5.1)
Sodium: 135 mmol/L (ref 135–145)

## 2024-02-02 LAB — CBC
HCT: 34.1 % — ABNORMAL LOW (ref 36.0–46.0)
Hemoglobin: 11.2 g/dL — ABNORMAL LOW (ref 12.0–15.0)
MCH: 26.1 pg (ref 26.0–34.0)
MCHC: 32.8 g/dL (ref 30.0–36.0)
MCV: 79.5 fL — ABNORMAL LOW (ref 80.0–100.0)
Platelets: 429 10*3/uL — ABNORMAL HIGH (ref 150–400)
RBC: 4.29 MIL/uL (ref 3.87–5.11)
RDW: 13.2 % (ref 11.5–15.5)
WBC: 12 10*3/uL — ABNORMAL HIGH (ref 4.0–10.5)
nRBC: 0 % (ref 0.0–0.2)

## 2024-02-02 LAB — GLUCOSE, CAPILLARY: Glucose-Capillary: 129 mg/dL — ABNORMAL HIGH (ref 70–99)

## 2024-02-02 LAB — URINE CULTURE: Culture: <10000 — AB

## 2024-02-02 MED ORDER — CEFDINIR 300 MG PO CAPS
300.0000 mg | ORAL_CAPSULE | Freq: Two times a day (BID) | ORAL | 0 refills | Status: AC
Start: 2024-02-02 — End: 2024-02-06

## 2024-02-02 MED ORDER — DULOXETINE HCL 30 MG PO CPEP: ORAL_CAPSULE | ORAL | Status: AC

## 2024-02-02 NOTE — Plan of Care (Signed)
  Problem: Health Behavior/Discharge Planning: Goal: Ability to manage health-related needs will improve Outcome: Progressing   Problem: Nutritional: Goal: Maintenance of adequate nutrition will improve Outcome: Progressing   Problem: Tissue Perfusion: Goal: Adequacy of tissue perfusion will improve Outcome: Progressing   Problem: Education: Goal: Knowledge of General Education information will improve Description: Including pain rating scale, medication(s)/side effects and non-pharmacologic comfort measures Outcome: Progressing   Problem: Clinical Measurements: Goal: Will remain free from infection Outcome: Progressing

## 2024-02-02 NOTE — Discharge Summary (Signed)
 Physician Discharge Summary   Patient: Lindsey George MRN: [id number] DOB: 16-Sep-1951  Admit date:     01/31/2024  Discharge date: 02/02/2024  Discharge Physician: Pennie Banter   PCP: Bridgette Habermann, PA-C   Recommendations at discharge:    Follow up with Primary Care in 1-2 weeks Follow up on blood pressure and resume losartan when clinically appropriate.  BP's were controlled with losartan on hold during admission. Repeat CBC, BMP in 1-2 weeks  Discharge Diagnoses: Principal Problem:   Sepsis secondary to acute pyelonephritis (HCC) Active Problems:   Postural dizziness with presyncope   Prolonged QT interval   Type 2 diabetes mellitus without complication, without long-term current use of insulin (HCC)   Essential hypertension   Cervical radiculopathy   Chronic bilateral low back pain with bilateral sciatica   Reactive airway disease, mild intermittent, uncomplicated   Parkinson's disease (HCC)   Pyelonephritis  Resolved Problems:   * No resolved hospital problems. [facility] Course:  HPI on admission 02/01/24: "Lindsey George is a 73 y.o. female with medical history significant for  DM, HTN reactive airway disease, chronic back pain, cervical radiculopathy Parkinson's disease, seen by her doctor a few days ago on 2/28 for dysuria, who presents to the ED With dizziness and presyncope.  Patient was in her usual state of health and went to the store and while standing she felt dizzy like she was about to pass out.  She had associated nausea but did not vomit.  She was helped her chair.  EMS was called.  Upon their arrival systolic was in the 60s and she received 500 mL bolus with improvement in systolic 100 by arrival in the ED. Of note patient had an abnormal UA on 12/2026, urine culture unavailable.  She was not prescribed antibiotics.  She currently denies dysuria or abdominal pain.  Denies fever.  Denies chest pain, palpitations, headache or visual disturbance or  one-sided weakness numbness or tingling. ED course and data review: Afebrile, tachypneic to 25 with BP 102/49 on arrival O2 sat in the high 90s on room air0.4 Hemoglobin 10, baseline 11.7 CMP notable for sodium of 132 but otherwise unremarkable Lipase and LFTs WNL Urinalysis with large leukocyte esterase and rare bacteria EKG, personally viewed and interpreted showing sinus at 74 and prolonged QT CT chest abdomen pelvis showing findings consistent with pyelonephritis as well as pleural and pulmonary nodules   Patient was started on broad-spectrum antibiotics for sepsis of unknown source, likely prior to CT results Given a fluid bolus Hospitalist consulted for admission. "  3/5 -- pt feels better, no acute complaints. Urine cultures resulted and patient transitioned to oral antibiotics.  Medically stable for d/c home today.  Assessment and Plan:  Sepsis secondary to acute pyelonephritis (HCC) Urinalysis consistent with UTI and CT chest abdomen pelvis consistent with pyelonephritis Patient hypotensive, tachypneic with leukocytosis and infection.  Febrile this morning.  - Treated with empiric IV Abx  - Urine cultures with insignficant growth   - Blood cultures neg to date   Postural dizziness with presyncope Hypotension- improving with holding of home antihypertensives and IVF.  - titrate back on home meds as tolerated - orthostatic VS - do not appear charted 3/5 - Symptoms have resolved per patient.       Consultants: none  Procedures performed: none   Disposition: Home  Diet recommendation:  Regular  DISCHARGE MEDICATION: Allergies as of 02/02/2024       Reactions   Codeine Other (  See Comments)   Abdominal pain        Medication List     PAUSE taking these medications    losartan 50 MG tablet Wait to take this until your doctor or other care provider tells you to start again. Commonly known as: COZAAR Take 1 tablet (50 mg total) by mouth daily.        TAKE these medications    alendronate 70 MG tablet Commonly known as: FOSAMAX TAKE 1 TABLET BY MOUTH ONCE A WEEK WITH  A  FULL  GLASS  OF  WATER  ON  AN  EMPTY  STOMACH   aspirin 81 MG tablet Take 81 mg by mouth at bedtime.   Calcium Carbonate-Vitamin D 600-10 MG-MCG Tabs Take 2 tablets by mouth daily with breakfast.   carbidopa-levodopa 25-100 MG tablet Commonly known as: SINEMET IR Take 1 tablet by mouth in the morning and at bedtime.   cyanocobalamin 1000 MCG tablet Commonly known as: VITAMIN B12 Take 1,000 mcg by mouth daily.   docusate sodium 50 MG capsule Commonly known as: COLACE Take 1 capsule (50 mg total) by mouth daily as needed for mild constipation or moderate constipation.   DULoxetine 30 MG capsule Commonly known as: CYMBALTA TAKE 2  BY MOUTH ONCE DAILY   EpiPen 2-Pak 0.3 MG/0.3ML Soaj injection Generic drug: EPINEPHrine Inject 0.3 mg into the muscle as needed for anaphylaxis.   gabapentin 300 MG capsule Commonly known as: NEURONTIN Take 1 capsule (300 mg total) by mouth 2 (two) times daily.   glipiZIDE 5 MG 24 hr tablet Commonly known as: glipiZIDE XL Take 1 tablet (5 mg total) by mouth daily.   metFORMIN 500 MG tablet Commonly known as: GLUCOPHAGE TAKE ONE TABLET BY MOUTH TWICE DAILY   OneTouch Ultra test strip Generic drug: glucose blood USE 1 STRIP TO CHECK GLUCOSE ONCE DAILY   pioglitazone 15 MG tablet Commonly known as: ACTOS Take 1 tablet (15 mg total) by mouth daily.   simvastatin 40 MG tablet Commonly known as: ZOCOR Take 1 tablet (40 mg total) by mouth daily.   vitamin C 1000 MG tablet Take 1,000 mg by mouth daily. Chewable       ASK your doctor about these medications    cefdinir 300 MG capsule Commonly known as: OMNICEF Take 1 capsule (300 mg total) by mouth 2 (two) times daily for 4 days. Ask about: Should I take this medication?        Discharge Exam: Filed Weights   02/01/24 0100  Weight: 82.1 kg   General  exam: awake, alert, no acute distress  Respiratory system: CTAB, no wheezes, rales or rhonchi, normal respiratory effort. Cardiovascular system: normal S1/S2, RRR   Gastrointestinal system: soft, NT, ND Central nervous system: A&O x 3. no gross focal neurologic deficits, normal speech Extremities: moves all , no edema, normal tone Skin: dry, intact, normal temperature Psychiatry: normal mood, congruent affect, judgement and insight appear normal   Condition at discharge: stable  The results of significant diagnostics from this hospitalization (including imaging, microbiology, ancillary and laboratory) are listed below for reference.   Imaging Studies: CT CHEST ABDOMEN PELVIS W CONTRAST Result Date: 01/31/2024 CLINICAL DATA:  73 year old for sepsis workup. EXAM: CT CHEST, ABDOMEN, AND PELVIS WITH CONTRAST TECHNIQUE: Multidetector CT imaging of the chest, abdomen and pelvis was performed following the standard protocol during bolus administration of intravenous contrast. RADIATION DOSE REDUCTION: This exam was performed according to the departmental dose-optimization program which includes automated exposure control,  adjustment of the mA and/or kV according to patient size and/or use of iterative reconstruction technique. CONTRAST:  OMNIPAQUE IOHEXOL 300 MG/ML  SOLN COMPARISON:  Limited comparison available with CT scan lumbar spine 11/10/2022. FINDINGS: CT CHEST FINDINGS Cardiovascular: There is mild cardiomegaly. No pericardial effusion. There is no visible coronary calcification. There is aortic atherosclerosis without aneurysm, stenosis or dissection with mild descending tortuosity. The pulmonary arteries and veins are normal caliber. No central arterial embolus is seen. The great vessels are clear. Mediastinum/Nodes: There is a small left posterior diaphragmatic fat herniation. Thyroid gland, both axillary spaces are unremarkable. There is no intrathoracic adenopathy. The trachea and main  bronchi are clear. The thoracic esophagus is unremarkable. Lungs/Pleura: There are scattered linear scarring or atelectasis in the lung bases. Moderately elevated right hemidiaphragm most likely due to eventration. The lungs are clear of focal infiltrates. There is a well-circumscribed ovoid focal pleural thickening in the posterolateral lower left chest measuring 2 x 0.8 cm on 5:94 outside the plane of the prior imaging. This could be a focal fibrous tumor of the pleura, a noncalcified pleural plaque such as due to asbestos exposure, or a primary or metastatic neoplasm. I believe this was probably present on PA and lateral chest films from 08/11/2018 and 06/18/2017 so the index of suspicion is low. Laterally in the right middle lobe there is a 4 mm subpleural noncalcified nodule, and just above this level on 5:72 there is a 6 mm subsolid nodule requiring follow-up. The lungs are otherwise clear. There is no pleural effusion or pneumothorax. Musculoskeletal: There is advanced degenerative disc disease and spondylosis of the thoracic spine. Osteopenia. Multiple healed fracture deformities of left ribs. No acute or other significant osseous findings. CT ABDOMEN PELVIS FINDINGS Hepatobiliary: Liver measures 19 cm length, mildly steatotic without mass enhancement. The gallbladder and bile ducts are unremarkable. Pancreas: Ductal anatomy consistent with pancreas divisum. No mass or ductal dilatation. Spleen: Appears to have been surgically removed. There are postsurgical changes in the subphrenic space and a 4 cm splenule. Adrenals/Urinary Tract: There is no adrenal or renal mass enhancement. On the left there is patchy cortical hypoenhancement with adjacent inflammatory reaction. Pyelonephritis is favored over vascular etiology due to good opacification in the renal arteries and veins. There is also slight urothelial thickening of the renal pelvis. Correlate with urinalysis. Right kidney is unremarkable. There is no  urinary stone or obstruction. The bladder is unremarkable. Stomach/Bowel: Stomach is within normal limits. Appendix appears normal. No evidence of bowel wall thickening, distention, or inflammatory changes. There is uncomplicated sigmoid diverticulosis. Mild-to-moderate fecal stasis. Vascular/Lymphatic: Aortic atherosclerosis. No enlarged abdominal or pelvic lymph nodes. Reproductive: Status post hysterectomy. No adnexal masses. Multiple pelvic phleboliths. Other: Small umbilical fat hernia. No incarcerated hernia. No free fluid, free hemorrhage or free air. Musculoskeletal: There is osteopenia, levoscoliosis and degenerative change of the lumbar spine. There is mild chronic wedging at L2 and 5, minimal grade 1 degenerative anterolisthesis at L3-4 and L4-5. IMPRESSION: 1. Patchy cortical hypoenhancement of the left kidney with adjacent inflammatory reaction, and slight urothelial thickening of the renal pelvis. Findings consistent with pyelonephritis. Correlate with urinalysis. 2. No other acute chest, abdominal or pelvic findings. 3. 2 x 0.8 cm well-circumscribed ovoid pleural thickening in the posterolateral lower left chest. This could be a focal fibrous tumor of the pleura, a noncalcified pleural plaque such as due to asbestos exposure, or a primary or metastatic neoplasm. I believe this was probably present on PA and lateral chest films  from 08/11/2018 and 06/18/2017 so the index of suspicion is low. Either PET-CT or attention on follow-up for the right middle lobe findings recommended. 4. Two right middle lobe nodules, largest is a 6 mm subsolid nodule. Follow-up non-contrast CT recommended at 3-6 months to confirm persistence. If unchanged, and solid component remains <6 mm, annual CT is recommended until 5 years of stability has been established. If persistent these nodules should be considered highly suspicious if the solid component of the nodule is 6 mm or greater in size and enlarging. This  recommendation follows the consensus statement: Guidelines for Management of Incidental Pulmonary Nodules Detected on CT Images: From the Fleischner Society 2017; Radiology 2017; 284:228-243. 5. Aortic atherosclerosis. 6. Constipation and diverticulosis. 7. Osteopenia and degenerative change. 8. Small umbilical fat hernia. Aortic Atherosclerosis (ICD10-I70.0). Electronically Signed   By: Almira Bar M.D.   On: 01/31/2024 23:39   DG Chest Port 1 View Result Date: 01/31/2024 CLINICAL DATA:  Questionable sepsis, dizziness, low blood pressure EXAM: PORTABLE CHEST 1 VIEW COMPARISON:  08/11/2018 FINDINGS: Stable cardiomediastinal silhouette. Aortic atherosclerotic calcification. Diffuse interstitial coarsening greater on the left. Bibasilar atelectasis. No pleural effusion or pneumothorax. No displaced rib fractures. Elevated right hemidiaphragm. IMPRESSION: Diffuse interstitial coarsening greater on the left may be due to atypical infection or edema. Electronically Signed   By: Minerva Fester M.D.   On: 01/31/2024 20:19    Microbiology: Results for orders placed or performed during the hospital encounter of 01/31/24  Urine Culture     Status: Abnormal   Collection Time: 01/31/24  4:27 PM   Specimen: Urine, Random  Result Value Ref Range Status   Specimen Description   Final    URINE, RANDOM Performed at [facility], [address] [place], [place] [address]    Special Requests   Final    NONE Reflexed from [id number] Performed at [facility], [address] [place], [place] [address]    Culture (A)  Final    <10,000 COLONIES/mL INSIGNIFICANT GROWTH Performed at [facility], [address], [place], [place] [address]    Report Status 02/02/2024 FINAL  Final  Blood culture (routine single)     Status: None   Collection Time: 01/31/24  4:36 PM   Specimen: BLOOD  Result Value Ref Range Status   Specimen Description BLOOD RIGHT ANTECUBITAL  Final   Special Requests    Final    BOTTLES DRAWN AEROBIC AND ANAEROBIC Blood Culture adequate volume   Culture   Final    NO GROWTH 5 DAYS Performed at [facility], [address], [place], [place] [address]    Report Status 02/05/2024 FINAL  Final  Culture, blood (single)     Status: None   Collection Time: 01/31/24  5:05 PM   Specimen: BLOOD  Result Value Ref Range Status   Specimen Description BLOOD BLOOD RIGHT FOREARM  Final   Special Requests   Final    BOTTLES DRAWN AEROBIC AND ANAEROBIC Blood Culture adequate volume   Culture   Final    NO GROWTH 5 DAYS Performed at [facility], [address] [place], [place] [address]    Report Status 02/05/2024 FINAL  Final    Labs: CBC: Recent Labs  Lab 01/31/24 1636 02/02/24 1032  WBC 15.6* 12.0*  NEUTROABS 11.5*  --   HGB 10.4* 11.2*  HCT 32.6* 34.1*  MCV 82.5 79.5*  PLT 342 429*   Basic Metabolic Panel: Recent Labs  Lab 01/31/24  1636 02/02/24 1032  NA 132* 135  K 3.5 3.3*  CL 96* 101  CO2 26 25  GLUCOSE 165* 118*  BUN 15 13  CREATININE 0.86 0.45  CALCIUM 9.0 9.3  MG 2.1  --    Liver Function Tests: Recent Labs  Lab 01/31/24 1636  AST 13*  ALT <5  ALKPHOS 47  BILITOT 0.8  PROT 6.8  ALBUMIN 3.5   CBG: Recent Labs  Lab 02/01/24 0752 02/01/24 1313 02/01/24 1631 02/01/24 2054 02/02/24 0752  GLUCAP 94 98 77 118* 129*    Discharge time spent: less than 30 minutes.  Signed: Pennie Banter, DO [facility] 02/07/2024

## 2024-02-02 NOTE — Progress Notes (Signed)
Discharge instructions reviewed with patient including followup visits and new medications/medication changes.  Understanding was verbalized and all questions were answered.  IV removed without complication; patient tolerated well.  Patient discharged home via wheelchair in stable condition escorted by volunteer staff.

## 2024-02-03 ENCOUNTER — Telehealth: Payer: Self-pay | Admitting: *Deleted

## 2024-02-03 NOTE — Transitions of Care (Post Inpatient/ED Visit) (Signed)
 02/03/2024  Name: Lindsey George MRN: [id number] DOB: Jan 24, 1951  Today's TOC FU Call Status: Today's TOC FU Call Status:: Successful TOC FU Call Completed TOC FU Call Complete Date: 02/03/24 Patient's Name and Date of Birth confirmed.  Transition Care Management Follow-up Telephone Call Date of Discharge: 02/02/24 Discharge Facility: [facility]) Type of Discharge: Inpatient Admission Primary Inpatient Discharge Diagnosis:: dizziness/ pre-syncope/ hypotension secondary to acute pyelonephritis How have you been since you were released from the hospital?: Better ("I feel better; taking the antibiotic like they told me to and holding the losartan until I see my doctor next week; I am now going to [facility] for my PCP because Dr. Yetta Barre at [facility] is retiring") Any questions or concerns?: No  Items Reviewed: Did you receive and understand the discharge instructions provided?: Yes Medications obtained,verified, and reconciled?: Partial Review Completed Reason for Partial Mediation Review: Patient declined: no longer has [facility] PCP; verified no concerns around medications/ self-manages medications Any new allergies since your discharge?: No Dietary orders reviewed?: No Do you have support at home?: Yes People in Home: spouse Name of Support/Comfort Primary Source: Reports independent in self-care activities; supportive spouse assists as/ if needed/ indicated  Medications Reviewed Today: Medications Reviewed Today     Reviewed by Michaela Corner, RN (Registered Nurse) on 02/03/24 at 1528  Med List Status: <None>   Medication Order Taking? Sig Documenting Provider Last Dose Status Informant  alendronate (FOSAMAX) 70 MG tablet [id number]  TAKE 1 TABLET BY MOUTH ONCE A WEEK WITH  A  FULL  GLASS  OF  WATER  ON  AN  EMPTY  STOMACH Duanne Limerick, MD  Active Self, Pharmacy Records  Ascorbic Acid (VITAMIN C) 1000 MG tablet [id number]  Take 1,000 mg by mouth  daily. Chewable [provider]  Active Self, Pharmacy Records  aspirin 81 MG tablet [id number]  Take 81 mg by mouth at bedtime. [provider]  Active Self, Pharmacy Records  Calcium Carbonate-Vitamin D 600-10 MG-MCG TABS [id number]  Take 2 tablets by mouth daily with breakfast. [provider]  Active Self, Pharmacy Records  carbidopa-levodopa (SINEMET IR) 25-100 MG tablet [id number]  Take 1 tablet by mouth in the morning and at bedtime. [provider]  Active Self, Pharmacy Records  cefdinir (OMNICEF) 300 MG capsule [id number] Yes Take 1 capsule (300 mg total) by mouth 2 (two) times daily for 4 days. Pennie Banter, DO Taking Active   cyanocobalamin (VITAMIN B12) 1000 MCG tablet [id number]  Take 1,000 mcg by mouth daily. [provider]  Active Self, Pharmacy Records  docusate sodium (COLACE) 50 MG capsule [id number]  Take 1 capsule (50 mg total) by mouth daily as needed for mild constipation or moderate constipation. Poggi, Excell Seltzer, MD  Active Self, Pharmacy Records  DULoxetine (CYMBALTA) 30 MG capsule [id number]  TAKE 2  BY MOUTH ONCE DAILY Pennie Banter, DO  Active   EPIPEN 2-PAK 0.3 MG/0.3ML SOAJ injection [id number]  Inject 0.3 mg into the muscle as needed for anaphylaxis. [provider]  Active Self, Pharmacy Records           Med Note Reather Littler D   Mon Jun 10, 2020  8:35 AM)    gabapentin (NEURONTIN) 300 MG capsule [id number]  Take 1 capsule (300 mg total) by mouth 2 (two) times daily. Poggi, Excell Seltzer, MD  Active Self, Pharmacy Records  glipiZIDE (GLIPIZIDE XL) 5 MG 24 hr tablet [id number]  Take 1 tablet (  5 mg total) by mouth daily. Duanne Limerick, MD  Active Self, Pharmacy Records  ipratropium-albuterol (DUONEB) 0.5-2.5 (3) MG/3ML nebulizer solution 3 mL [id number]   Duanne Limerick, MD  Active   losartan (COZAAR) 50 MG tablet [id number]  Take 1 tablet (50 mg total) by mouth daily. Duanne Limerick, MD  Active Self, Pharmacy Records            Med Note Jonnie Kind Feb 03, 2024  3:28 PM) 02/03/24- confirmed during [facility] call is holding as instructed at time of hospital discharge on 02/02/24  metFORMIN (GLUCOPHAGE) 500 MG tablet [id number]  TAKE ONE TABLET BY MOUTH TWICE DAILY Duanne Limerick, MD  Active Self, Pharmacy Records  [facility] ULTRA test strip [id number]  USE 1 STRIP TO CHECK GLUCOSE ONCE DAILY Duanne Limerick, MD  Active Self, Pharmacy Records  pioglitazone (ACTOS) 15 MG tablet [id number]  Take 1 tablet (15 mg total) by mouth daily. Duanne Limerick, MD  Active Self, Pharmacy Records  simvastatin (ZOCOR) 40 MG tablet [id number]  Take 1 tablet (40 mg total) by mouth daily. Duanne Limerick, MD  Active Self, Pharmacy Records           Home Care and Equipment/Supplies: Were Home Health Services Ordered?: No Any new equipment or medical supplies ordered?: No  Functional Questionnaire: Do you need assistance with bathing/showering or dressing?: No Do you need assistance with meal preparation?: No Do you need assistance with eating?: No Do you have difficulty maintaining continence: No Do you need assistance with getting out of bed/getting out of a chair/moving?: No Do you have difficulty managing or taking your medications?: No  Follow up appointments reviewed: PCP Follow-up appointment confirmed?: Yes Date of PCP follow-up appointment?:  ("I made the appointment this morning-- it's next week with my PCP at [facility]") Follow-up Provider: PCP- [facility] Follow-up appointment confirmed?: NA (verified not indicated per hospital discharging provider discharge notes) Do you need transportation to your follow-up appointment?: No Do you understand care options if your condition(s) worsen?: Yes-patient verbalized understanding  SDOH Interventions Today    Flowsheet Row Most Recent Value  SDOH Interventions   Food Insecurity Interventions Patient Declined  Housing Interventions Patient  Declined  Transportation Interventions Intervention Not Indicated  [drives self at baseline,  husband assists as indicated]  Utilities Interventions Patient Declined      Interventions Today    Flowsheet Row Most Recent Value  Chronic Disease   Chronic disease during today's visit Other  [acute pyelonephritis]  General Interventions   General Interventions Discussed/Reviewed General Interventions Discussed, Doctor Visits  Doctor Visits Discussed/Reviewed Doctor Visits Discussed, PCP  PCP/Specialist Visits Compliance with follow-up visit  Education Interventions   Education Provided Provided Education  Provided Verbal Education On When to see the doctor  Pharmacy Interventions   Pharmacy Dicussed/Reviewed Pharmacy Topics Discussed      TOC Interventions Today    Flowsheet Row Most Recent Value  TOC Interventions   TOC Interventions Discussed/Reviewed TOC Interventions Discussed      Total time spent from review to signing of note/ including any care coordination interventions:  33 minutes  Pls call/ message for questions,  Caryl Pina, RN, BSN, Media planner  Transitions of Care  [facility] [phone]: direct office

## 2024-02-05 LAB — CULTURE, BLOOD (SINGLE)
Culture: NO GROWTH
Culture: NO GROWTH
Special Requests: ADEQUATE
Special Requests: ADEQUATE

## 2024-02-11 ENCOUNTER — Telehealth: Payer: Self-pay | Admitting: Neurosurgery

## 2024-02-11 DIAGNOSIS — M5412 Radiculopathy, cervical region: Secondary | ICD-10-CM

## 2024-02-11 DIAGNOSIS — M5030 Other cervical disc degeneration, unspecified cervical region: Secondary | ICD-10-CM

## 2024-02-14 NOTE — Telephone Encounter (Signed)
 Left message on her voice mail that identified her name, that she needs to get refills from Dr.Shah.

## 2024-02-14 NOTE — Telephone Encounter (Signed)
 Last seen by Dr. Myer Haff on 08/05/23 and she has no follow up scheduled. He gave her a 3 month supply of neurontin 300mg  tid on 09/17/23.   Per her last note with Dr. Sherryll Burger (neurology), she was to continue on neurontin 300 in the am and 600 at night.   Please have her get her neurontin refills from Dr. Sherryll Burger since we are not treating her. Please let her know.   I have sent back the refill denied.

## 2024-05-04 ENCOUNTER — Encounter: Payer: Self-pay | Admitting: *Deleted

## 2024-05-22 ENCOUNTER — Encounter
Admission: RE | Disposition: A | Discharge status: Home / Self Care | Payer: Self-pay | Source: Home / Self Care | Attending: Gastroenterology

## 2024-05-22 ENCOUNTER — Ambulatory Visit: Admitting: Certified Registered"

## 2024-05-22 ENCOUNTER — Other Ambulatory Visit: Payer: Self-pay

## 2024-05-22 ENCOUNTER — Ambulatory Visit
Admission: RE | Admit: 2024-05-22 | Discharge: 2024-05-22 | Disposition: A | Discharge status: Home / Self Care | Payer: Medicare PPO | Attending: Gastroenterology | Admitting: Gastroenterology

## 2024-05-22 DIAGNOSIS — Z860101 Personal history of adenomatous and serrated colon polyps: Secondary | ICD-10-CM | POA: Insufficient documentation

## 2024-05-22 DIAGNOSIS — Z7984 Long term (current) use of oral hypoglycemic drugs: Secondary | ICD-10-CM | POA: Diagnosis not present

## 2024-05-22 DIAGNOSIS — K640 First degree hemorrhoids: Secondary | ICD-10-CM | POA: Diagnosis not present

## 2024-05-22 DIAGNOSIS — E785 Hyperlipidemia, unspecified: Secondary | ICD-10-CM | POA: Insufficient documentation

## 2024-05-22 DIAGNOSIS — E119 Type 2 diabetes mellitus without complications: Secondary | ICD-10-CM | POA: Insufficient documentation

## 2024-05-22 DIAGNOSIS — I10 Essential (primary) hypertension: Secondary | ICD-10-CM | POA: Insufficient documentation

## 2024-05-22 DIAGNOSIS — K5900 Constipation, unspecified: Secondary | ICD-10-CM | POA: Diagnosis not present

## 2024-05-22 DIAGNOSIS — Z09 Encounter for follow-up examination after completed treatment for conditions other than malignant neoplasm: Secondary | ICD-10-CM | POA: Insufficient documentation

## 2024-05-22 HISTORY — DX: Other bursitis of knee, left knee: M70.52

## 2024-05-22 HISTORY — DX: Complex tear of medial meniscus, current injury, right knee, initial encounter: S83.231A

## 2024-05-22 HISTORY — DX: Allergic rhinitis, unspecified: J30.9

## 2024-05-22 HISTORY — DX: Tremor, unspecified: R25.1

## 2024-05-22 HISTORY — DX: Other cervical disc degeneration, unspecified cervical region: M50.30

## 2024-05-22 HISTORY — DX: Personal history of adenomatous and serrated colon polyps: Z86.0101

## 2024-05-22 HISTORY — DX: Other amnesia: R41.3

## 2024-05-22 HISTORY — DX: Unspecified asthma, uncomplicated: J45.909

## 2024-05-22 HISTORY — DX: Unilateral primary osteoarthritis, right knee: M17.11

## 2024-05-22 HISTORY — DX: Asymptomatic varicose veins of bilateral lower extremities: I83.93

## 2024-05-22 HISTORY — DX: Impingement syndrome of left shoulder: M75.42

## 2024-05-22 HISTORY — DX: Spinal stenosis, lumbar region with neurogenic claudication: M48.062

## 2024-05-22 HISTORY — PX: COLONOSCOPY WITH PROPOFOL: SHX5780

## 2024-05-22 LAB — GLUCOSE, CAPILLARY: Glucose-Capillary: 135 mg/dL — ABNORMAL HIGH (ref 70–99)

## 2024-05-22 SURGERY — COLONOSCOPY WITH PROPOFOL
Anesthesia: General

## 2024-05-22 MED ORDER — PROPOFOL 500 MG/50ML IV EMUL
INTRAVENOUS | Status: DC | PRN
  Administered 2024-05-22: 25 mg via INTRAVENOUS
  Administered 2024-05-22: 130 ug/kg/min via INTRAVENOUS

## 2024-05-22 MED ORDER — SODIUM CHLORIDE 0.9 % IV SOLN: INTRAVENOUS | Status: DC

## 2024-05-22 MED ORDER — LIDOCAINE HCL (CARDIAC) PF 100 MG/5ML IV SOSY
PREFILLED_SYRINGE | INTRAVENOUS | Status: DC | PRN
Start: 2024-05-22 — End: 2024-05-22
  Administered 2024-05-22 (×2): 25 mg via INTRAVENOUS

## 2024-05-22 NOTE — Interval H&P Note (Signed)
 History and Physical Interval Note:  05/22/2024 9:05 AM  Lindsey George  has presented today for surgery, with the diagnosis of h/o colon polyps constipation.  The various methods of treatment have been discussed with the patient and family. After consideration of risks, benefits and other options for treatment, the patient has consented to  Procedure(s): COLONOSCOPY WITH PROPOFOL  (N/A) as a surgical intervention.  The patient's history has been reviewed, patient examined, no change in status, stable for surgery.  I have reviewed the patient's chart and labs.  Questions were answered to the patient's satisfaction.     Ole ONEIDA Schick  Ok to proceed with colonoscopy

## 2024-05-22 NOTE — Transfer of Care (Signed)
 Immediate Anesthesia Transfer of Care Note  Patient: Lindsey George  Procedure(s) Performed: COLONOSCOPY WITH PROPOFOL   Patient Location: PACU  Anesthesia Type:General  Level of Consciousness: drowsy  Airway & Oxygen  Therapy: Patient Spontanous Breathing  Post-op Assessment: Report given to RN and Post -op Vital signs reviewed and stable  Post vital signs: stable  Last Vitals:  Vitals Value Taken Time  BP 123/70 05/22/24 09:33  Temp    Pulse 72 05/22/24 09:33  Resp 21 05/22/24 09:33  SpO2 96 % 05/22/24 09:33  Vitals shown include unfiled device data.  Last Pain:  Vitals:   05/22/24 0830  TempSrc: Temporal  PainSc: 0-No pain         Complications: No notable events documented.

## 2024-05-22 NOTE — Anesthesia Preprocedure Evaluation (Signed)
 Anesthesia Evaluation  Patient identified by MRN, date of birth, ID band Patient awake    Reviewed: Allergy & Precautions, NPO status , Patient's Chart, lab work & pertinent test results  History of Anesthesia Complications Negative for: history of anesthetic complications  Airway Mallampati: II  TM Distance: >3 FB Neck ROM: Full    Dental no notable dental hx. (+) Teeth Intact   Pulmonary asthma , neg sleep apnea, neg COPD, Patient abstained from smoking.Not current smoker   Pulmonary exam normal breath sounds clear to auscultation       Cardiovascular Exercise Tolerance: Good METShypertension, Pt. on medications (-) CAD and (-) Past MI (-) dysrhythmias  Rhythm:Regular Rate:Normal - Systolic murmurs    Neuro/Psych  Neuromuscular disease  negative psych ROS   GI/Hepatic ,neg GERD  ,,(+)     (-) substance abuse    Endo/Other  diabetes, Well Controlled    Renal/GU negative Renal ROS     Musculoskeletal   Abdominal   Peds  Hematology   Anesthesia Other Findings Past Medical History: No date: Allergic rhinitis No date: Asthma No date: Asymptomatic varicose veins of both lower extremities No date: Asymptomatic varicose veins of both lower extremities No date: Cancer (HCC)     Comment:  skin ca No date: Chronic back pain No date: Complex tear of medial meniscus of right knee No date: DDD (degenerative disc disease), cervical No date: Diabetes mellitus without complication (HCC) No date: Hx of adenomatous colonic polyps No date: Hyperlipidemia No date: Hypertension No date: Impingement syndrome of left shoulder No date: Loss of memory No date: Lumbar stenosis with neurogenic claudication No date: Obesity No date: Osteoporosis No date: Pes anserinus bursitis of left knee No date: Primary osteoarthritis of right knee No date: Reactive airway disease No date: Tremor  Reproductive/Obstetrics                              Anesthesia Physical Anesthesia Plan  ASA: 2  Anesthesia Plan: General   Post-op Pain Management: Minimal or no pain anticipated   Induction: Intravenous  PONV Risk Score and Plan: 3 and Propofol  infusion, TIVA and Ondansetron   Airway Management Planned: Nasal Cannula  Additional Equipment: None  Intra-op Plan:   Post-operative Plan:   Informed Consent: I have reviewed the patients History and Physical, chart, labs and discussed the procedure including the risks, benefits and alternatives for the proposed anesthesia with the patient or authorized representative who has indicated his/her understanding and acceptance.     Dental advisory given  Plan Discussed with: CRNA and Surgeon  Anesthesia Plan Comments: (Discussed risks of anesthesia with patient, including possibility of difficulty with spontaneous ventilation under anesthesia necessitating airway intervention, PONV, and rare risks such as cardiac or respiratory or neurological events, and allergic reactions. Discussed the role of CRNA in patient's perioperative care. Patient understands.)       Anesthesia Quick Evaluation

## 2024-05-22 NOTE — Op Note (Signed)
 [facility] Gastroenterology Patient Name: Lindsey George Procedure Date: 05/22/2024 8:48 AM MRN: [id number] Account #: [id number] Date of Birth: 10/25/1951 Admit Type: Outpatient Age: 73 Room: [facility] ENDO ROOM 3 Gender: Female Note Status: Supervisor Override Instrument Name: [id number] Procedure:             Colonoscopy Indications:           Surveillance: Personal history of adenomatous polyps,                         inadequate prep on last colonoscopy (less than 1 year                         ago), Constipation Providers:             Ole Schick MD, MD Medicines:             Monitored Anesthesia Care Complications:         No immediate complications. Procedure:             Pre-Anesthesia Assessment:                        - Prior to the procedure, a History and Physical was                         performed, and patient medications and allergies were                         reviewed. The patient is competent. The risks and                         benefits of the procedure and the sedation options and                         risks were discussed with the patient. All questions                         were answered and informed consent was obtained.                         Patient identification and proposed procedure were                         verified by the physician, the nurse, the                         anesthesiologist, the anesthetist and the technician                         in the endoscopy suite. Mental Status Examination:                         alert and oriented. Airway Examination: normal                         oropharyngeal airway and neck mobility. Respiratory                         Examination: clear to auscultation. CV Examination:  normal. Prophylactic Antibiotics: The patient does not                         require prophylactic antibiotics. Prior                         Anticoagulants: The patient  has taken no anticoagulant                         or antiplatelet agents. ASA Grade Assessment: II - A                         patient with mild systemic disease. After reviewing                         the risks and benefits, the patient was deemed in                         satisfactory condition to undergo the procedure. The                         anesthesia plan was to use monitored anesthesia care                         (MAC). Immediately prior to administration of                         medications, the patient was re-assessed for adequacy                         to receive sedatives. The heart rate, respiratory                         rate, oxygen  saturations, blood pressure, adequacy of                         pulmonary ventilation, and response to care were                         monitored throughout the procedure. The physical                         status of the patient was re-assessed after the                         procedure.                        After obtaining informed consent, the colonoscope was                         passed under direct vision. Throughout the procedure,                         the patient's blood pressure, pulse, and oxygen                          saturations were monitored continuously. The  Colonoscope was introduced through the anus and                         advanced to the the cecum, identified by appendiceal                         orifice and ileocecal valve. The colonoscopy was                         performed without difficulty. The patient tolerated                         the procedure well. The quality of the bowel                         preparation was good. The ileocecal valve, appendiceal                         orifice, and rectum were photographed. Findings:      The perianal and digital rectal examinations were normal.      Internal hemorrhoids were found during retroflexion. The hemorrhoids        were Grade I (internal hemorrhoids that do not prolapse).      The exam was otherwise without abnormality on direct and retroflexion       views. Impression:            - Internal hemorrhoids.                        - The examination was otherwise normal on direct and                         retroflexion views.                        - No specimens collected. Recommendation:        - Discharge patient to home.                        - Resume previous diet.                        - Continue present medications.                        - Repeat colonoscopy is not recommended due to current                         age (24 years or older) for surveillance.                        - Return to referring physician as previously                         scheduled. Procedure Code(s):     --- Professional ---                        [phone], Colonoscopy, flexible; diagnostic, including  collection of specimen(s) by brushing or washing, when                         performed (separate procedure) Diagnosis Code(s):     --- Professional ---                        Z86.010, Personal history of colonic polyps                        K64.0, First degree hemorrhoids CPT copyright 2022 American [employer] Association. All rights reserved. The codes documented in this report are preliminary and upon coder review may  be revised to meet current compliance requirements. Ole Schick MD, MD 05/22/2024 9:34:19 AM Number of Addenda: 0 Note Initiated On: 05/22/2024 8:48 AM Scope Withdrawal Time: 0 hours 5 minutes 35 seconds  Total Procedure Duration: 0 hours 15 minutes 41 seconds  Estimated Blood Loss:  Estimated blood loss: none.      [facility]

## 2024-05-22 NOTE — Anesthesia Postprocedure Evaluation (Signed)
 Anesthesia Post Note  Patient: Lindsey George  Procedure(s) Performed: COLONOSCOPY WITH PROPOFOL   Patient location during evaluation: Endoscopy Anesthesia Type: General Level of consciousness: awake and alert Pain management: pain level controlled Vital Signs Assessment: post-procedure vital signs reviewed and stable Respiratory status: spontaneous breathing, nonlabored ventilation, respiratory function stable and patient connected to nasal cannula oxygen  Cardiovascular status: blood pressure returned to baseline and stable Postop Assessment: no apparent nausea or vomiting Anesthetic complications: no   There were no known notable events for this encounter.   Last Vitals:  Vitals:   05/22/24 0933 05/22/24 0943  BP: 123/70   Pulse:    Resp:    Temp: (!) 36.3 C   SpO2:  96%    Last Pain:  Vitals:   05/22/24 0943  TempSrc:   PainSc: 0-No pain                 Rome Ade

## 2024-05-22 NOTE — H&P (Signed)
 Outpatient short stay form Pre-procedure 05/22/2024  Lindsey ONEIDA Schick, MD  Primary Physician: Jonette Lauraine BRAVO, PA-C  Reason for visit:  Surveillance  History of present illness:    73 y/o lady with history of DM II, hypertension, and HLD here for colonoscopy for history of adenomatous polyps. Last colonoscopy in 2024 had inadequate prep. No blood thinners. No family history of GI malignancies. History of partial hysterectomy.     Current Facility-Administered Medications:    0.9 %  sodium chloride  infusion, , Intravenous, Continuous, Becky Berberian, Lindsey ONEIDA, MD, Last Rate: 20 mL/hr at 05/22/24 9147, Continued from Pre-op at 05/22/24 9147  Facility-Administered Medications Prior to Admission  Medication Dose Route Frequency Provider Last Rate Last Admin   ipratropium-albuterol  (DUONEB) 0.5-2.5 (3) MG/3ML nebulizer solution 3 mL  3 mL Nebulization Once Jones, Deanna C, MD       Medications Prior to Admission  Medication Sig Dispense Refill Last Dose/Taking   alendronate  (FOSAMAX ) 70 MG tablet TAKE 1 TABLET BY MOUTH ONCE A WEEK WITH  A  FULL  GLASS  OF  WATER  ON  AN  EMPTY  STOMACH 12 tablet 0 Past Week   Ascorbic Acid (VITAMIN C) 1000 MG tablet Take 1,000 mg by mouth daily. Chewable   Past Week   aspirin  81 MG tablet Take 81 mg by mouth at bedtime.   Past Week   Calcium Carbonate-Vitamin D 600-10 MG-MCG TABS Take 2 tablets by mouth daily with breakfast.   Past Week   carbidopa -levodopa  (SINEMET  IR) 25-100 MG tablet Take 1 tablet by mouth in the morning and at bedtime.   Past Week   cyanocobalamin  (VITAMIN B12) 1000 MCG tablet Take 1,000 mcg by mouth daily.   Past Week   docusate sodium  (COLACE) 50 MG capsule Take 1 capsule (50 mg total) by mouth daily as needed for mild constipation or moderate constipation.   Past Week   DULoxetine  (CYMBALTA ) 30 MG capsule TAKE 2  BY MOUTH ONCE DAILY   Past Week   EPIPEN  2-PAK 0.3 MG/0.3ML SOAJ injection Inject 0.3 mg into the muscle as needed for  anaphylaxis.   Past Week   gabapentin  (NEURONTIN ) 300 MG capsule Take 1 capsule (300 mg total) by mouth 2 (two) times daily.   Past Week   glipiZIDE  (GLIPIZIDE  XL) 5 MG 24 hr tablet Take 1 tablet (5 mg total) by mouth daily. 90 tablet 1 Past Week   metFORMIN  (GLUCOPHAGE ) 500 MG tablet TAKE ONE TABLET BY MOUTH TWICE DAILY 180 tablet 1 Past Week   ONETOUCH ULTRA test strip USE 1 STRIP TO CHECK GLUCOSE ONCE DAILY 50 each 2 Past Week   pioglitazone  (ACTOS ) 15 MG tablet Take 1 tablet (15 mg total) by mouth daily. 90 tablet 1 Past Week   simvastatin  (ZOCOR ) 40 MG tablet Take 1 tablet (40 mg total) by mouth daily. 90 tablet 1 Past Week   [Paused] losartan  (COZAAR ) 50 MG tablet Take 1 tablet (50 mg total) by mouth daily. 90 tablet 1      Allergies  Allergen Reactions   Codeine  Other (See Comments)    Abdominal pain     Past Medical History:  Diagnosis Date   Allergic rhinitis    Asthma    Asymptomatic varicose veins of both lower extremities    Asymptomatic varicose veins of both lower extremities    Cancer (HCC)    skin ca   Chronic back pain    Complex tear of medial meniscus of right knee  DDD (degenerative disc disease), cervical    Diabetes mellitus without complication (HCC)    Hx of adenomatous colonic polyps    Hyperlipidemia    Hypertension    Impingement syndrome of left shoulder    Loss of memory    Lumbar stenosis with neurogenic claudication    Obesity    Osteoporosis    Pes anserinus bursitis of left knee    Primary osteoarthritis of right knee    Reactive airway disease    Tremor     Review of systems:  Otherwise negative.    Physical Exam  Gen: Alert, oriented. Appears stated age.  HEENT: PERRLA. Lungs: No respiratory distress CV: RRR Abd: soft, benign, no masses Ext: No edema    Planned procedures: Proceed with colonoscopy. The patient understands the nature of the planned procedure, indications, risks, alternatives and potential complications  including but not limited to bleeding, infection, perforation, damage to internal organs and possible oversedation/side effects from anesthesia. The patient agrees and gives consent to proceed.  Please refer to procedure notes for findings, recommendations and patient disposition/instructions.     Lindsey ONEIDA Schick, MD [facility]

## 2024-05-26 ENCOUNTER — Other Ambulatory Visit: Payer: Self-pay | Admitting: Internal Medicine

## 2024-05-26 DIAGNOSIS — Z1231 Encounter for screening mammogram for malignant neoplasm of breast: Secondary | ICD-10-CM

## 2024-05-26 NOTE — Progress Notes (Unsigned)
 Referring Physician:  No referring provider defined for this encounter.  Primary Physician:  Jonette Lauraine BRAVO, PA-C  History of Present Illness: 05/26/2024*** Lindsey George has a history of HTN, reactive airway disease, DM, parkinson's, osteoporosis, hypercholesterolemia, obesity.   Last seen by Dr. Clois on 08/05/23. She has known severe central stenosis L3-L4 with multilevel foraminal stenosis. She did well with injections back in 2021. Pain improved after injections and PT. She was sent to ortho for right knee pain.        Left leg pain? Any back pain?  Duration: *** Location: *** Quality: *** Severity: ***  Precipitating: aggravated by *** Modifying factors: made better by *** Weakness: none Timing: ***  She does not smoke.   Bowel/Bladder Dysfunction: none  Conservative measures:  Physical therapy: *** has not participated in the past, last did some for her back in 2024 Multimodal medical therapy including regular antiinflammatories: cymbalta , neurontin   Injections: *** 10/10/20: Bilateral L4-5 ESI 11/25/20: Left L4-5 ESI 12/25/22: Right L4-5 and L5-S1 ESI  Past Surgery: ***no spinal surgeries  Lindsey George has ***no symptoms of cervical myelopathy.  The symptoms are causing a significant impact on the patient's life.   Review of Systems:  A 10 point review of systems is negative, except for the pertinent positives and negatives detailed in the HPI.  Past Medical History: Past Medical History:  Diagnosis Date   Allergic rhinitis    Asthma    Asymptomatic varicose veins of both lower extremities    Asymptomatic varicose veins of both lower extremities    Cancer (HCC)    skin ca   Chronic back pain    Complex tear of medial meniscus of right knee    DDD (degenerative disc disease), cervical    Diabetes mellitus without complication (HCC)    Hx of adenomatous colonic polyps    Hyperlipidemia    Hypertension    Impingement syndrome of  left shoulder    Loss of memory    Lumbar stenosis with neurogenic claudication    Obesity    Osteoporosis    Pes anserinus bursitis of left knee    Primary osteoarthritis of right knee    Reactive airway disease    Tremor     Past Surgical History: Past Surgical History:  Procedure Laterality Date   ABDOMINAL HYSTERECTOMY     BREAST BIOPSY Right 06/22/11   bx/clip-neg   BREAST CYST ASPIRATION Left    neg   COLONOSCOPY  2014   cleared for 5 yrs- [facility] doc   COLONOSCOPY WITH PROPOFOL  N/A 03/25/2018   Procedure: COLONOSCOPY WITH PROPOFOL ;  Surgeon: Viktoria Lamar DASEN, MD;  Location: [facility] ENDOSCOPY;  Service: Endoscopy;  Laterality: N/A;   COLONOSCOPY WITH PROPOFOL  N/A 04/20/2023   Procedure: COLONOSCOPY WITH PROPOFOL ;  Surgeon: Maryruth Ole DASEN, MD;  Location: [facility] ENDOSCOPY;  Service: Endoscopy;  Laterality: N/A;  NEEDS AM; HAS A GRADUATION AT 6:30 PM   COLONOSCOPY WITH PROPOFOL  N/A 05/22/2024   Procedure: COLONOSCOPY WITH PROPOFOL ;  Surgeon: Maryruth Ole DASEN, MD;  Location[facility] ENDOSCOPY;  Service: Endoscopy;  Laterality: N/A;   KNEE ARTHROSCOPY Right 11/04/2023   Procedure: RIGHT KNEE ARTHROSCOPY WITH DEBRIDEMENT, PARTIAL MEDIAL MENISCECTOMY, AND REINJECTION OF HARVESTED FAT CELLS.;  Surgeon: Edie Norleen PARAS, MD;  Location: [facility] ORS;  Service: Orthopedics;  Laterality: Right;   PARTIAL HYSTERECTOMY     SPLENECTOMY      Allergies: Allergies as of 05/30/2024 - Review Complete 05/22/2024  Allergen Reaction Noted  Codeine  Other (See Comments) 08/30/2015    Medications: Outpatient Encounter Medications as of 05/30/2024  Medication Sig   alendronate  (FOSAMAX ) 70 MG tablet TAKE 1 TABLET BY MOUTH ONCE A WEEK WITH  A  FULL  GLASS  OF  WATER  ON  AN  EMPTY  STOMACH   Ascorbic Acid (VITAMIN C) 1000 MG tablet Take 1,000 mg by mouth daily. Chewable   aspirin  81 MG tablet Take 81 mg by mouth at bedtime.   Calcium Carbonate-Vitamin D 600-10 MG-MCG TABS Take 2 tablets by mouth daily with  breakfast.   carbidopa -levodopa  (SINEMET  IR) 25-100 MG tablet Take 1 tablet by mouth in the morning and at bedtime.   cyanocobalamin  (VITAMIN B12) 1000 MCG tablet Take 1,000 mcg by mouth daily.   docusate sodium  (COLACE) 50 MG capsule Take 1 capsule (50 mg total) by mouth daily as needed for mild constipation or moderate constipation.   DULoxetine  (CYMBALTA ) 30 MG capsule TAKE 2  BY MOUTH ONCE DAILY   EPIPEN  2-PAK 0.3 MG/0.3ML SOAJ injection Inject 0.3 mg into the muscle as needed for anaphylaxis.   gabapentin  (NEURONTIN ) 300 MG capsule Take 1 capsule (300 mg total) by mouth 2 (two) times daily.   glipiZIDE  (GLIPIZIDE  XL) 5 MG 24 hr tablet Take 1 tablet (5 mg total) by mouth daily.   [Paused] losartan  (COZAAR ) 50 MG tablet Take 1 tablet (50 mg total) by mouth daily.   metFORMIN  (GLUCOPHAGE ) 500 MG tablet TAKE ONE TABLET BY MOUTH TWICE DAILY   ONETOUCH ULTRA test strip USE 1 STRIP TO CHECK GLUCOSE ONCE DAILY   pioglitazone  (ACTOS ) 15 MG tablet Take 1 tablet (15 mg total) by mouth daily.   simvastatin  (ZOCOR ) 40 MG tablet Take 1 tablet (40 mg total) by mouth daily.   Facility-Administered Encounter Medications as of 05/30/2024  Medication   ipratropium-albuterol  (DUONEB) 0.5-2.5 (3) MG/3ML nebulizer solution 3 mL    Social History: Social History   Tobacco Use   Smoking status: Never   Smokeless tobacco: Never   Tobacco comments:    smoking cessation materials not required  Vaping Use   Vaping status: Never Used  Substance Use Topics   Alcohol use: Not Currently   Drug use: Never    Family Medical History: Family History  Problem Relation Age of Onset   Breast cancer Sister 65   Cancer Sister    Heart disease Sister    Cancer Father    Stroke Father    Heart disease Sister     Physical Examination: There were no vitals filed for this visit.  General: Patient is well developed, well nourished, calm, collected, and in no apparent distress. Attention to examination is  appropriate.  Respiratory: Patient is breathing without any difficulty.   NEUROLOGICAL:     Awake, alert, oriented to person, place, and time.  Speech is clear and fluent. Fund of knowledge is appropriate.   Cranial Nerves: Pupils equal round and reactive to light.  Facial tone is symmetric.    *** ROM of cervical spine *** pain *** posterior cervical tenderness. *** tenderness in bilateral trapezial region.   *** ROM of lumbar spine *** pain *** posterior lumbar tenderness.   No abnormal lesions on exposed skin.   Strength: Side Biceps Triceps Deltoid Interossei Grip Wrist Ext. Wrist Flex.  R 5 5 5 5 5 5 5   L 5 5 5 5 5 5 5    Side Iliopsoas Quads Hamstring PF DF EHL  R 5 5 5 5  5  5  L 5 5 5 5 5 5    Reflexes are ***2+ and symmetric at the biceps, brachioradialis, patella and achilles.   Hoffman's is absent.  Clonus is not present.   Bilateral upper and lower extremity sensation is intact to light touch.     Gait is normal.   ***No difficulty with tandem gait.    Medical Decision Making  Imaging: Nothing recent.   Assessment and Plan: Lindsey George is a pleasant 73 y.o. female has ***  Treatment options discussed with patient and following plan made:   - Order for physical therapy for *** spine ***. Patient to call to schedule appointment. *** - Continue current medications including ***. Reviewed dosing and side effects.  - Prescription for ***. Reviewed dosing and side effects. Take with food.  - Prescription for *** to take prn muscle spasms. Reviewed dosing and side effects. Discussed this can cause drowsiness.  - MRI of *** to further evaluate *** radiculopathy. No improvement time or medications (***).  - Referral to PMR at [facility] to discuss possible *** injections.  - Will schedule phone visit to review MRI results once I get them back.   I spent a total of *** minutes in face-to-face and non-face-to-face activities related to this patient's care today including  review of outside records, review of imaging, review of symptoms, physical exam, discussion of differential diagnosis, discussion of treatment options, and documentation.   Thank you for involving me in the care of this patient.   Glade Boys PA-C Dept. of Neurosurgery

## 2024-05-30 ENCOUNTER — Ambulatory Visit: Admitting: Orthopedic Surgery

## 2024-05-30 ENCOUNTER — Encounter: Payer: Self-pay | Admitting: Orthopedic Surgery

## 2024-05-30 VITALS — BP 130/78 | Ht 63.0 in | Wt 176.0 lb

## 2024-05-30 DIAGNOSIS — M5412 Radiculopathy, cervical region: Secondary | ICD-10-CM | POA: Diagnosis not present

## 2024-05-30 DIAGNOSIS — R200 Anesthesia of skin: Secondary | ICD-10-CM | POA: Diagnosis not present

## 2024-05-30 DIAGNOSIS — M48061 Spinal stenosis, lumbar region without neurogenic claudication: Secondary | ICD-10-CM

## 2024-05-30 DIAGNOSIS — G629 Polyneuropathy, unspecified: Secondary | ICD-10-CM

## 2024-05-30 DIAGNOSIS — R202 Paresthesia of skin: Secondary | ICD-10-CM

## 2024-05-30 DIAGNOSIS — M5416 Radiculopathy, lumbar region: Secondary | ICD-10-CM | POA: Diagnosis not present

## 2024-05-30 NOTE — Patient Instructions (Signed)
 It was so nice to see you today. Thank you so much for coming in.    Pain in your left arm and leg pain may be from your small fiber neuropathy, but it may also be from your neck/back.   Continue on the tylenol  as directed on bottle. Max of 3000mg  per 24 hours.   I will message you next week to check on you and I I will review everything with Dr. Claudene as well. We can regroup at that time.   I would also get in touch with Dr. Maree and let him know about your arm/leg pain.   Please do not hesitate to call if you have any questions or concerns. You can also message me in MyChart.   Glade Boys PA-C [phone]     The physicians and staff at [facility] at [facility] are committed to providing excellent care. You may receive a survey asking for feedback about your experience at our office. We value you your feedback and appreciate you taking the time to to fill it out. The [facility] leadership team is also available to discuss your experience in person, feel free to contact us  [phone].

## 2024-07-10 ENCOUNTER — Ambulatory Visit
Admission: RE | Admit: 2024-07-10 | Discharge: 2024-07-10 | Disposition: A | Discharge status: Home / Self Care | Source: Ambulatory Visit | Attending: Internal Medicine | Admitting: Internal Medicine

## 2024-07-10 DIAGNOSIS — Z1231 Encounter for screening mammogram for malignant neoplasm of breast: Secondary | ICD-10-CM | POA: Diagnosis present

## 2024-08-21 ENCOUNTER — Other Ambulatory Visit (HOSPITAL_COMMUNITY): Payer: Self-pay

## 2024-09-01 ENCOUNTER — Other Ambulatory Visit: Payer: Self-pay

## 2024-09-01 MED ORDER — COMIRNATY 30 MCG/0.3ML IM SUSY
0.3000 mL | PREFILLED_SYRINGE | Freq: Once | INTRAMUSCULAR | 0 refills | Status: AC
  Filled 2024-09-01: qty 0.3, 1d supply, fill #0

## 2024-09-15 ENCOUNTER — Other Ambulatory Visit: Payer: Self-pay

## 2024-09-15 MED ORDER — FLUZONE HIGH-DOSE 0.5 ML IM SUSY
0.5000 mL | PREFILLED_SYRINGE | Freq: Once | INTRAMUSCULAR | 0 refills | Status: AC
  Filled 2024-09-15: qty 0.5, 1d supply, fill #0
